# Patient Record
Sex: Female | Born: 1950 | Race: Black or African American | Hispanic: No | Smoking: Never smoker
Health system: Southern US, Community
[De-identification: ages and names within clinical notes are randomized; demographics above are authoritative.]

## PROBLEM LIST (undated history)

## (undated) DIAGNOSIS — I10 Essential (primary) hypertension: Secondary | ICD-10-CM

## (undated) DIAGNOSIS — E785 Hyperlipidemia, unspecified: Secondary | ICD-10-CM

## (undated) DIAGNOSIS — C719 Malignant neoplasm of brain, unspecified: Secondary | ICD-10-CM

## (undated) DIAGNOSIS — E079 Disorder of thyroid, unspecified: Secondary | ICD-10-CM

## (undated) DIAGNOSIS — J189 Pneumonia, unspecified organism: Secondary | ICD-10-CM

## (undated) HISTORY — DX: Essential (primary) hypertension: I10

## (undated) HISTORY — DX: Hyperlipidemia, unspecified: E78.5

## (undated) HISTORY — DX: Disorder of thyroid, unspecified: E07.9

## (undated) HISTORY — PX: TOOTH EXTRACTION: SUR596

---
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
## 1981-06-11 HISTORY — PX: ABDOMINAL HYSTERECTOMY: SHX81

## 2000-01-18 ENCOUNTER — Ambulatory Visit (HOSPITAL_COMMUNITY): Admission: RE | Admit: 2000-01-18 | Discharge: 2000-01-18 | Payer: Self-pay | Admitting: Gastroenterology

## 2003-11-05 ENCOUNTER — Ambulatory Visit (HOSPITAL_COMMUNITY): Admission: RE | Admit: 2003-11-05 | Discharge: 2003-11-05 | Payer: Self-pay | Admitting: Gastroenterology

## 2006-04-01 ENCOUNTER — Encounter: Admission: RE | Admit: 2006-04-01 | Discharge: 2006-04-01 | Payer: Self-pay | Admitting: Internal Medicine

## 2006-04-01 IMAGING — CR DG CHEST 2V
2 series · 2 of 2 positions shown · non-contrast
Comparison: None.

CLINICAL DATA: Persistent cough and shortness of breath.
CHEST ? 2 VIEW:

[view not recorded (1 of 2)]
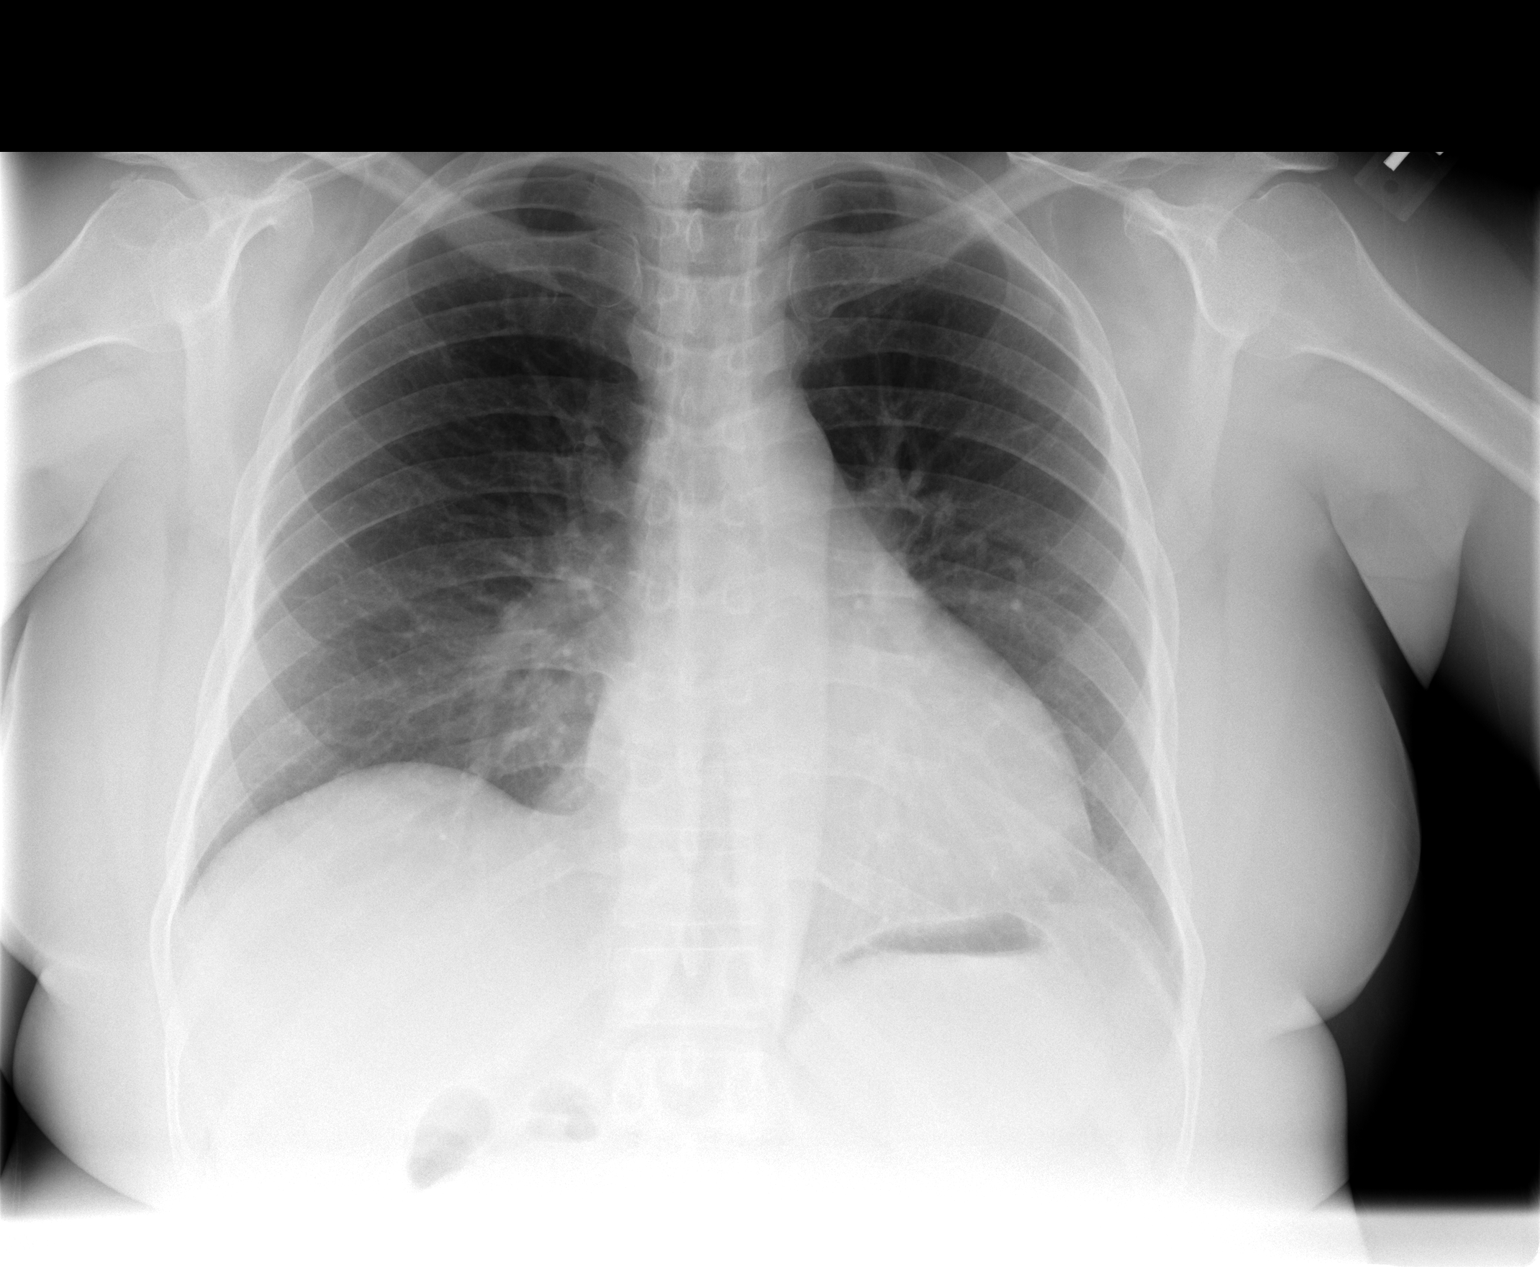

[view not recorded (2 of 2)]
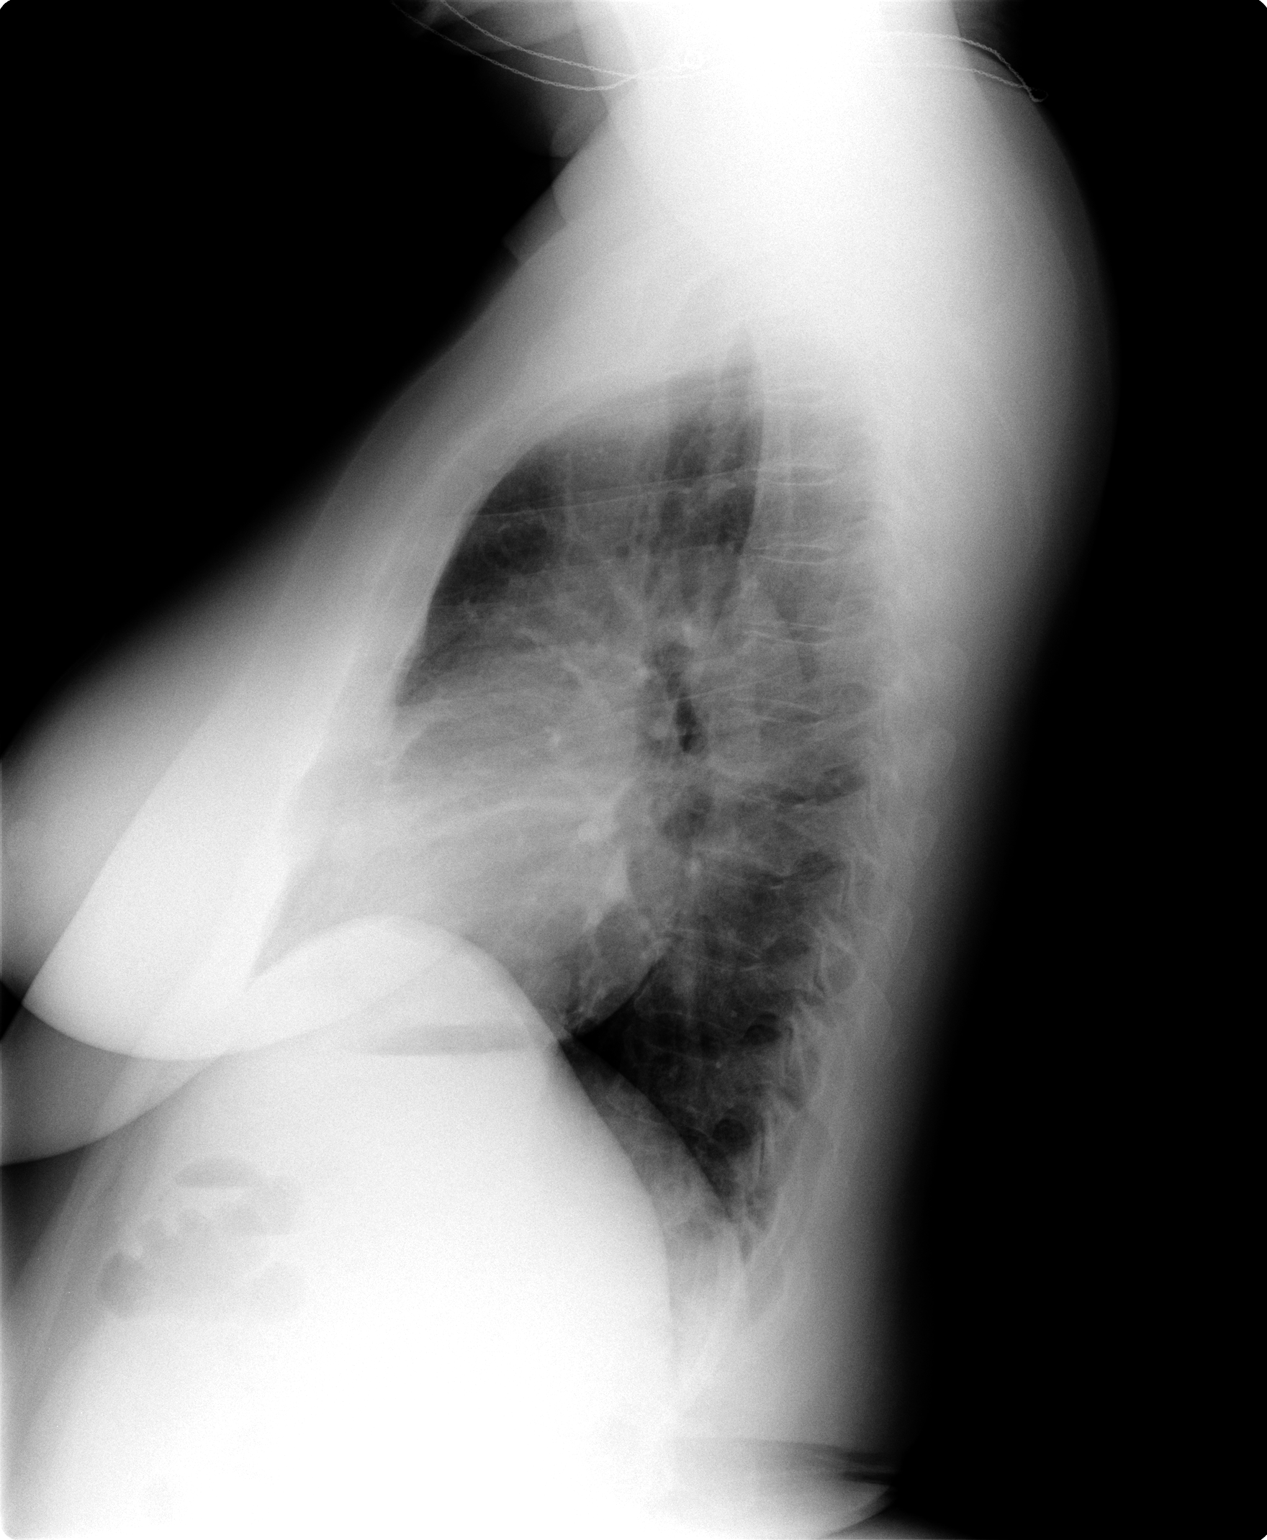

[2 of 2 positions shown; findings below may reference images not displayed]

FINDINGS: Opacity in the right infrahilar region is not well seen on the lateral view.  Lungs are otherwise clear.  No pleural fluid.
IMPRESSION: Right infrahilar opacity may be due to mild pectus deformity.  Developing airspace disease not excluded.

## 2006-10-24 ENCOUNTER — Emergency Department (HOSPITAL_COMMUNITY): Admission: EM | Admit: 2006-10-24 | Discharge: 2006-10-24 | Payer: Self-pay | Admitting: Family Medicine

## 2010-02-11 ENCOUNTER — Emergency Department (HOSPITAL_COMMUNITY): Admission: EM | Admit: 2010-02-11 | Discharge: 2010-02-12 | Payer: Self-pay | Admitting: Emergency Medicine
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
## 2010-10-27 NOTE — Op Note (Signed)
NAME:  ZAKYIA, GAGAN                         ACCOUNT NO.:  [id number]   MEDICAL RECORD NO.:  [id number]                   PATIENT TYPE:  AMB   LOCATION:  ENDO                                 FACILITY:  [facility]   PHYSICIAN:  Anselmo Rod, M.D.               DATE OF BIRTH:  10-05-1950   DATE OF PROCEDURE:  11/07/2003  DATE OF DISCHARGE:  11/05/2003                                 OPERATIVE REPORT   PROCEDURE:  Screening colonoscopy.   ENDOSCOPIST:  Charna Elizabeth, M.D.   INSTRUMENT USED:  Olympus video colonoscope.   INDICATIONS FOR PROCEDURE:  A 60 year old African-American female with a  personal history of adenomatous polyps removed in the past, and guaiac-  positive stool on a recent examination; undergoing a repeat colonoscopy for  surveillance purposes and rule out recurrent polyps.   PREPROCEDURE PREPARATION:  Informed consent was procured from the patient.  The patient was fasted for 8 hr prior to the procedure and prepped with a  bottle of magnesium citrate and a gallon of NuLytely the night prior to the  procedure.   PREPROCEDURE PHYSICAL:  VITAL SIGNS:  Stable.  NECK:  Supple.  CHEST:  Clear to auscultation.  HEART:  S1, S2 regular.  ABDOMEN:  Soft with normal bowel sounds.   DESCRIPTION OF PROCEDURE:  The patient was placed in the left lateral  decubitus position and sedated with 70 mg Demerol and 7 mg of Versed in slow  incremental doses.  Once the patient was adequately sedated and maintained  on low-flow oxygen and continuous cardiac monitoring, the Olympus video  colonoscope was advanced into the rectum to the cecum.  With the exception  of a few scattered diverticula, no other abnormalities were noted.  The  entire colonic mucosa appeared healthy with a normal vascular pattern.  The  appendiceal orifice and the ileocecal valve were clearly visualized and  photographed.  In retroflexion the rectum revealed no abnormalities.   IMPRESSION:  Normal colonoscopy up  to the cecum, except for a few scattered  diverticula.   RECOMMENDATIONS:  1. Repeat guaiac testing will be done on an outpatient basis, and further     recommendations made as needed.  2. A repeat colonoscopy is recommended in the next five years, consistent     with the patient's personal history of     adenomatous polyps in the past.  3. Outpatient follow-up as need arises in the future.  4. Continue a high fiber diet for now.                                               Anselmo Rod, M.D.    JNM/MEDQ  D:  11/07/2003  T:  11/07/2003  Job:  [id number]   cc:   Cam Hai,  C.N.M.  [address]  [place], [place] [address]  Fax: [phone]

## 2010-10-27 NOTE — Procedures (Signed)
[facility]  Patient:    Denise Wright, Denise Wright                     MRN: [id number] Proc. Date: 01/18/00 Attending:  Anselmo Rod, M.D. CC:         Merlene Laughter. Renae Gloss, M.D.                           Procedure Report  DATE OF BIRTH:  September 24, 1950  PROCEDURE:  Colonoscopy.  ENDOSCOPIST:  Anselmo Rod, M.D.  INSTRUMENT USED:  Olympus video colonoscope.  INDICATIONS FOR PROCEDURE:  Personal history of polyps in a 59 year old black female, rule out recurrent polyps.  PREPROCEDURE PREPARATION:  Informed consent was obtained from the patient. The patient was fasted for 8 hours prior to the procedure and prepped with a bottle of magnesium citrate and a gallon of Nulytely the night prior to the procedure.  PREPROCEDURE PHYSICAL EXAMINATION:  VITAL SIGNS:  Stable.  NECK:  Supple.  CHEST:  Clear to auscultation.  S1 and S2 regular.  ABDOMEN:  Soft with normal abdominal bowel sounds.  DESCRIPTION OF PROCEDURE:  The patient was placed in the left lateral decubitus position and sedated with 50 mg of Demerol and 5 mg of Versed intravenously.  Once the patient was adequately sedated and maintained on low flow oxygen and continuous cardiac monitoring, the Olympus video colonoscope was advanced from the rectum to the cecum without difficulty.  The patient had an excellent prep.  No masses, polyps, erosions, ulcerations, or diverticuli were seen.  There were small external hemorrhoids present on anal inspection. The patient tolerated the procedure well without complications.  IMPRESSION: 1. Normal colonoscopy. 2. Small external hemorrhoids.  RECOMMENDATIONS:  Repeat colorectal cancer screening is recommended in the next 5 years or earlier if the patient develops any symptoms in the interim. DD:  01/18/00 TD:  01/18/00 Job: [id number]

## 2011-07-11 ENCOUNTER — Emergency Department (HOSPITAL_COMMUNITY): Payer: BC Managed Care – PPO

## 2011-07-11 ENCOUNTER — Emergency Department (HOSPITAL_COMMUNITY)
Admission: EM | Admit: 2011-07-11 | Discharge: 2011-07-11 | Disposition: A | Discharge status: Home / Self Care | Payer: BC Managed Care – PPO | Attending: Emergency Medicine | Admitting: Emergency Medicine

## 2011-07-11 ENCOUNTER — Encounter (HOSPITAL_COMMUNITY): Payer: Self-pay

## 2011-07-11 DIAGNOSIS — R6889 Other general symptoms and signs: Secondary | ICD-10-CM

## 2011-07-11 DIAGNOSIS — R197 Diarrhea, unspecified: Secondary | ICD-10-CM | POA: Insufficient documentation

## 2011-07-11 DIAGNOSIS — R0602 Shortness of breath: Secondary | ICD-10-CM | POA: Insufficient documentation

## 2011-07-11 DIAGNOSIS — R079 Chest pain, unspecified: Secondary | ICD-10-CM | POA: Insufficient documentation

## 2011-07-11 DIAGNOSIS — R000 Tachycardia, unspecified: Secondary | ICD-10-CM | POA: Insufficient documentation

## 2011-07-11 DIAGNOSIS — J111 Influenza due to unidentified influenza virus with other respiratory manifestations: Secondary | ICD-10-CM | POA: Insufficient documentation

## 2011-07-11 DIAGNOSIS — J189 Pneumonia, unspecified organism: Secondary | ICD-10-CM | POA: Insufficient documentation

## 2011-07-11 LAB — DIFFERENTIAL
Basophils Absolute: 0 10*3/uL (ref 0.0–0.1)
Basophils Relative: 0 % (ref 0–1)
Eosinophils Relative: 0 % (ref 0–5)
Monocytes Absolute: 1.7 10*3/uL — ABNORMAL HIGH (ref 0.1–1.0)

## 2011-07-11 LAB — CBC
HCT: 35.3 % — ABNORMAL LOW (ref 36.0–46.0)
MCH: 29.5 pg (ref 26.0–34.0)
MCHC: 33.7 g/dL (ref 30.0–36.0)
MCV: 87.4 fL (ref 78.0–100.0)
RDW: 14.1 % (ref 11.5–15.5)

## 2011-07-11 LAB — POCT I-STAT, CHEM 8
Calcium, Ion: 1.11 mmol/L — ABNORMAL LOW (ref 1.12–1.32)
HCT: 39 % (ref 36.0–46.0)
TCO2: 25 mmol/L (ref 0–100)

## 2011-07-11 IMAGING — CR DG CHEST 2V
2 series · 2 of 2 positions shown · non-contrast
Comparison: [DATE]

CLINICAL DATA: Cough, chest pain, shortness of breath.

CHEST - 2 VIEW

[w chest pa]
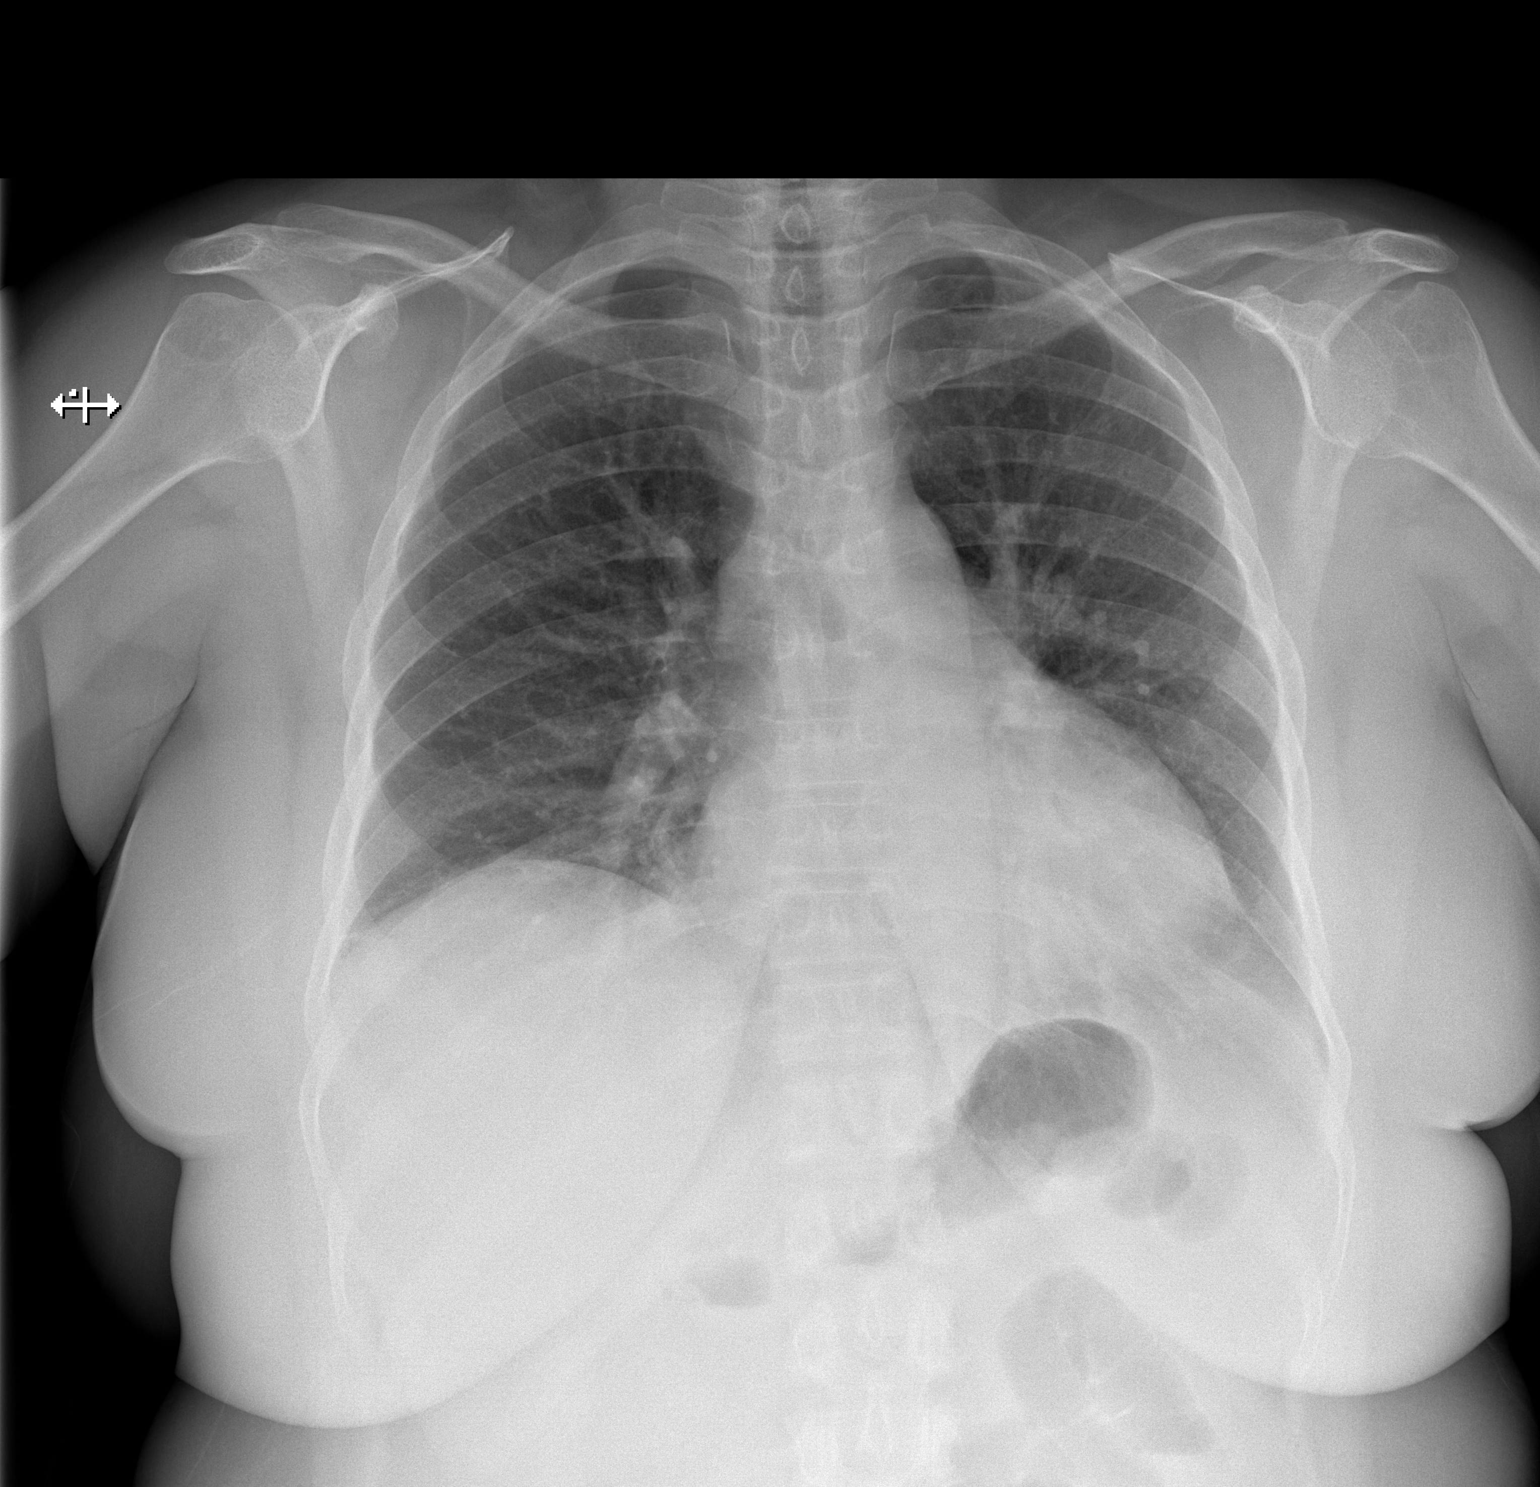

[w chest lat]
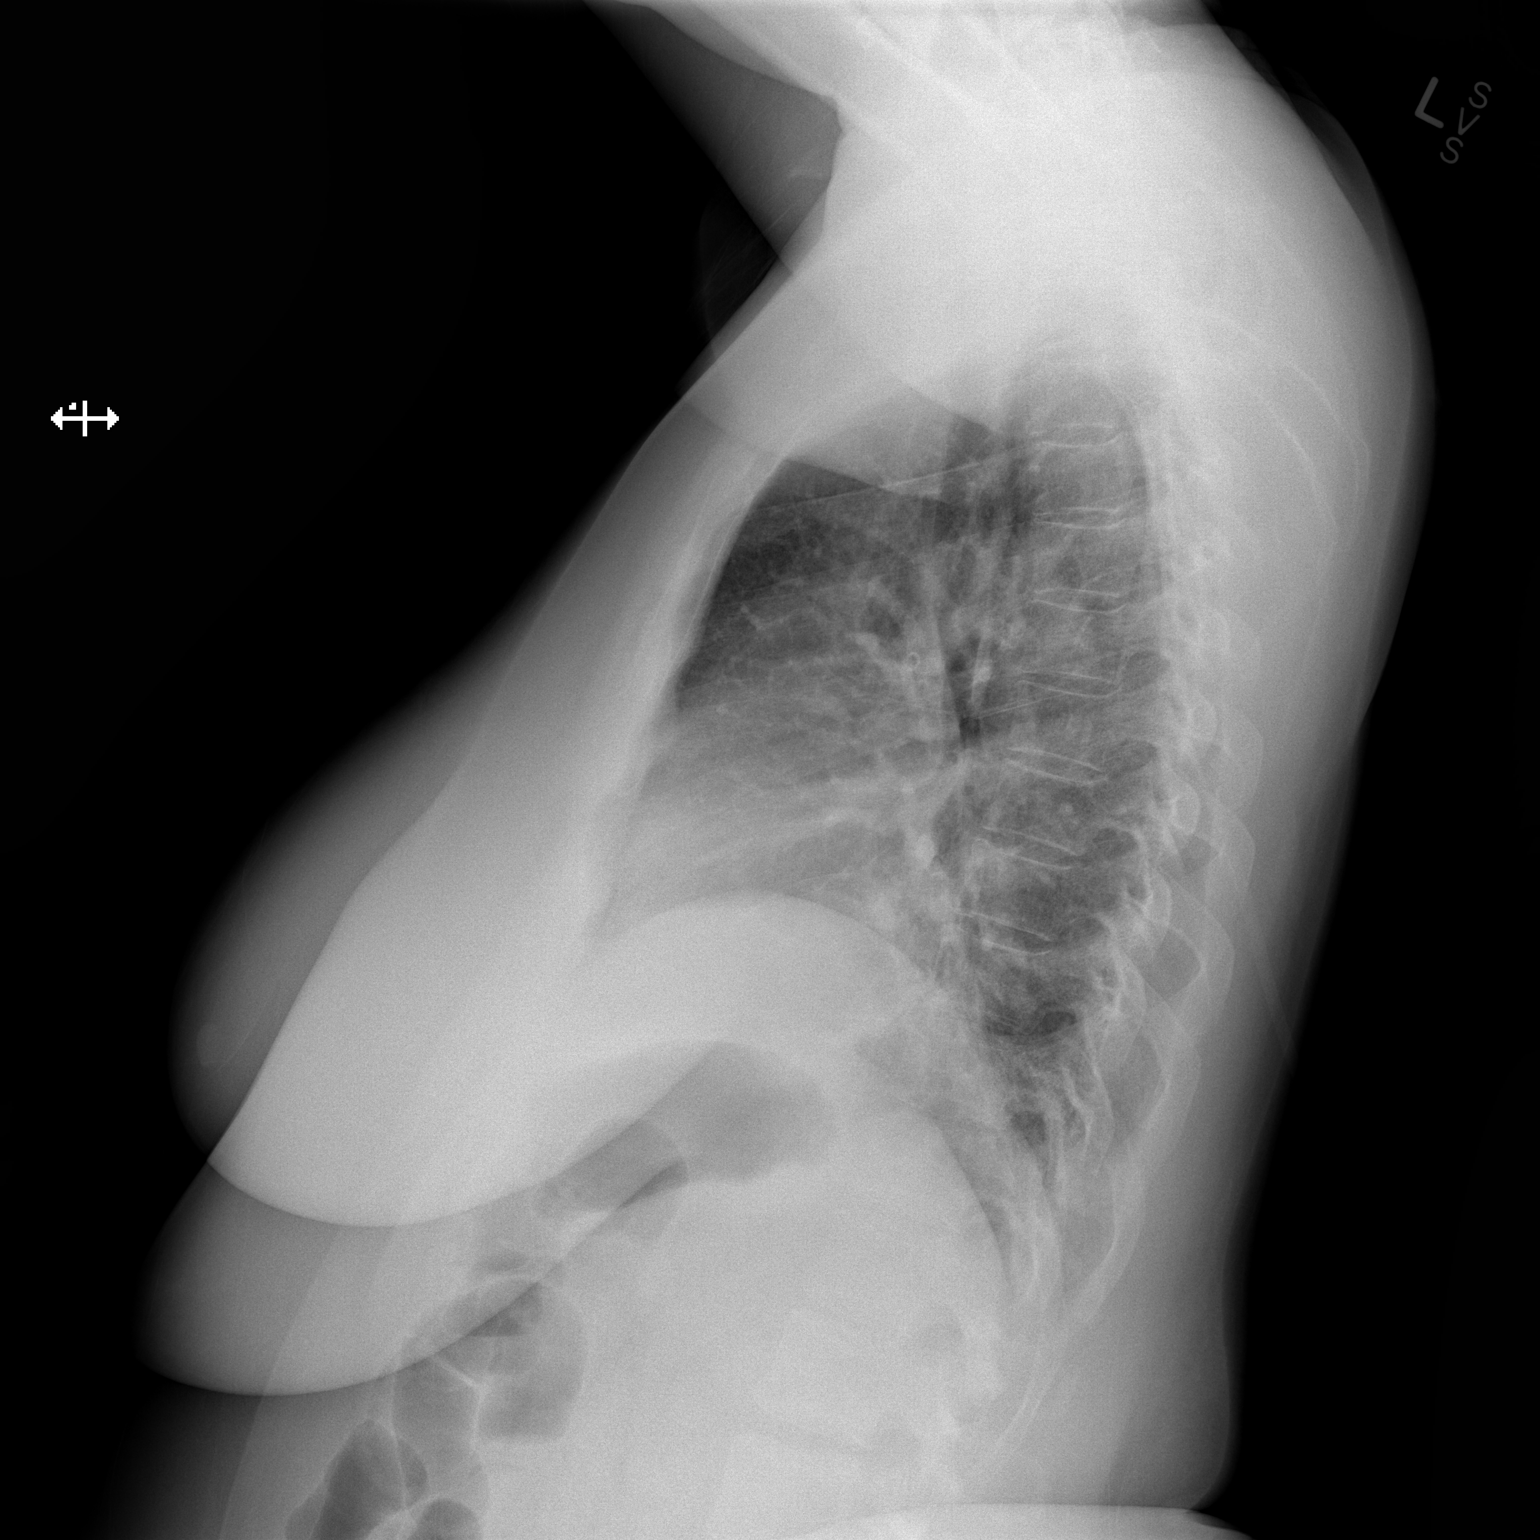

[2 of 2 positions shown; findings below may reference images not displayed]

FINDINGS: Heart is mildly enlarged.  Bilateral lower lobe airspace
opacities are noted.  Airspace disease noted posteriorly on the
lateral view.  Findings concerning for possible pneumonia,
particularly in the left lower lobe. Right base opacity may reflect
atelectasis.  No effusions.  No acute bony abnormality.
IMPRESSION: Bibasilar opacities, atelectasis versus pneumonia.

## 2011-07-11 MED ORDER — IBUPROFEN 200 MG PO TABS
600.0000 mg | ORAL_TABLET | Freq: Once | ORAL | Status: AC
  Administered 2011-07-11: 600 mg via ORAL
  Filled 2011-07-11: qty 3

## 2011-07-11 MED ORDER — SODIUM CHLORIDE 0.9 % IV BOLUS (SEPSIS)
500.0000 mL | Freq: Once | INTRAVENOUS | Status: AC
  Administered 2011-07-11: 500 mL via INTRAVENOUS

## 2011-07-11 MED ORDER — ACETAMINOPHEN 500 MG PO TABS
1000.0000 mg | ORAL_TABLET | Freq: Once | ORAL | Status: AC
  Administered 2011-07-11: 1000 mg via ORAL
  Filled 2011-07-11: qty 2

## 2011-07-11 MED ORDER — OSELTAMIVIR PHOSPHATE 75 MG PO CAPS: 75.0000 mg | ORAL_CAPSULE | Freq: Two times a day (BID) | ORAL | Status: DC

## 2011-07-11 MED ORDER — LEVOFLOXACIN 500 MG PO TABS: 750.0000 mg | ORAL_TABLET | Freq: Every day | ORAL | Status: AC

## 2011-07-11 MED ORDER — ONDANSETRON HCL 4 MG/2ML IJ SOLN
4.0000 mg | INTRAMUSCULAR | Status: DC | PRN
  Administered 2011-07-11: 4 mg via INTRAVENOUS
  Filled 2011-07-11: qty 2

## 2011-07-11 MED ORDER — HYDROCODONE-HOMATROPINE 5-1.5 MG/5ML PO SYRP: 5.0000 mL | ORAL_SOLUTION | Freq: Four times a day (QID) | ORAL | Status: DC | PRN

## 2011-07-11 MED ORDER — MORPHINE SULFATE 4 MG/ML IJ SOLN
4.0000 mg | Freq: Once | INTRAMUSCULAR | Status: AC
  Administered 2011-07-11: 4 mg via INTRAVENOUS
  Filled 2011-07-11: qty 1

## 2011-07-11 MED ORDER — ONDANSETRON HCL 4 MG PO TABS: 4.0000 mg | ORAL_TABLET | Freq: Four times a day (QID) | ORAL | Status: DC | PRN

## 2011-07-11 NOTE — ED Notes (Signed)
Cough, chills, body aches since yesterday, vomiting and diarrhea x 1 hour

## 2011-07-11 NOTE — ED Notes (Signed)
Pt given discharge instructions, verb understanding and amb indep to discharge window

## 2011-07-11 NOTE — ED Notes (Signed)
Patient is resting comfortably. 

## 2011-07-11 NOTE — ED Provider Notes (Signed)
61 year old female had onset yesterday of fever, chills, cough and myalgias. Today she developed vomiting and diarrhea. Chest x-ray shows a faint infiltrate at the left base consistent with pneumonia. Her lungs are clear to exam. Clinically, she has influenza. Because of her pneumonia, she will need to be treated with antibiotics. She will be offered a prescription for Tamiflu, however, I have explained the limitations of Tamiflu and treating influenza. She states she did not get her flu shot this year. Vomiting and diarrhea will be treated symptomatically.  Dione Booze, MD 07/11/11 [phone]

## 2011-07-11 NOTE — ED Provider Notes (Signed)
History     CSN: [id number]  Arrival date & time 07/11/11  1422   First MD Initiated Contact with Patient 07/11/11 1457      Chief Complaint  Patient presents with  . Cough  . Emesis  . Diarrhea    (Consider location/radiation/quality/duration/timing/severity/associated sxs/prior treatment) HPI Comments: Patient presents to the emergency department with chief complaint of cough, emesis, and diarrhea.  Patient states cough symptoms began yesterday evening and were associated with fever, night sweats, and chills around 6 p.m. patient states that this morning she began having fatigue and bodyaches and that her cough became productive.  Patient has been using throat lossengers and Robitussin which have not worked. Around 1 o'clock this afternoon the patient began to develop emesis x2 episodes and diarrhea x4 episodes.  Patient denies hematemesis and hematochezia.  Patient states she's had a history of bronchitis and denies having sick contacts.  Patient's primary care physician is Dr. Renae Gloss.  Patient denies shortness of breath, wheezing, dyspnea on exertion, chest pain, UA lateral or bilateral leg swelling, claudication, abdominal pain  Patient is a 61 y.o. female presenting with cough, vomiting, and diarrhea. The history is provided by the patient.  Cough Associated symptoms include chills and myalgias. Pertinent negatives include no chest pain, no ear pain, no rhinorrhea and no sore throat.  Emesis  Associated symptoms include chills, cough, diarrhea, a fever and myalgias. Pertinent negatives include no abdominal pain.  Diarrhea The primary symptoms include fever, fatigue, vomiting, diarrhea and myalgias. Primary symptoms do not include abdominal pain, nausea, dysuria or rash.  The myalgias are not associated with weakness.  The illness is also significant for chills.    History reviewed. No pertinent past medical history.  Past Surgical History  Procedure Date  . Abdominal  hysterectomy     Family History  Problem Relation Age of Onset  . Diabetes Mother   . Hypertension Mother   . Hypertension Father     History  Substance Use Topics  . Smoking status: Never Smoker   . Smokeless tobacco: Never Used  . Alcohol Use: No    OB History    Grav Para Term Preterm Abortions TAB SAB Ect Mult Living                  Review of Systems  Constitutional: Positive for fever, chills and fatigue.  HENT: Negative for ear pain, congestion, sore throat, rhinorrhea, sneezing, neck pain, neck stiffness, sinus pressure and tinnitus.   Eyes: Negative for visual disturbance.  Respiratory: Positive for cough and chest tightness.   Cardiovascular: Negative for chest pain and palpitations.  Gastrointestinal: Positive for vomiting and diarrhea. Negative for nausea and abdominal pain.  Genitourinary: Negative for dysuria.  Musculoskeletal: Positive for myalgias.  Skin: Negative for color change and rash.  Neurological: Negative for dizziness and weakness.  Hematological: Does not bruise/bleed easily.  Psychiatric/Behavioral: Negative for confusion.  All other systems reviewed and are negative.    Allergies  Penicillins  Home Medications   Current Outpatient Rx  Name Route Sig Dispense Refill  . VITAMIN D 1000 UNITS PO TABS Oral Take 2,000 Units by mouth daily.      BP 139/73  Pulse 110  Temp(Src) 100.6 F (38.1 C) (Oral)  Resp 18  Ht 5\' 4"  (1.626 m)  Wt 198 lb (89.812 kg)  BMI 33.99 kg/m2  SpO2 96%  Physical Exam  Nursing note and vitals reviewed. Constitutional: She is oriented to person, place, and time. She appears  well-developed and well-nourished. No distress.       Mildly febrile, 100.6  HENT:  Head: Normocephalic and atraumatic. No trismus in the jaw.  Right Ear: External ear normal. No drainage or tenderness. No mastoid tenderness.  Left Ear: External ear normal. No drainage or tenderness. No mastoid tenderness.  Nose: Nose normal. No  rhinorrhea or sinus tenderness.  Mouth/Throat: Uvula is midline, oropharynx is clear and moist and mucous membranes are normal. No uvula swelling. No oropharyngeal exudate.  Eyes: Conjunctivae and EOM are normal. Right eye exhibits no discharge. Left eye exhibits no discharge. No scleral icterus.  Neck: Normal range of motion. Neck supple.  Cardiovascular: Regular rhythm and normal heart sounds.        Tachycardic at 110  Pulmonary/Chest: Effort normal and breath sounds normal. No stridor. No respiratory distress. She has no wheezes. She exhibits tenderness.  Abdominal: Soft. There is no tenderness.  Musculoskeletal: Normal range of motion.  Neurological: She is alert and oriented to person, place, and time.  Skin: Skin is warm and dry. No rash noted. She is not diaphoretic.  Psychiatric: She has a normal mood and affect. Her behavior is normal.    ED Course  Procedures (including critical care time)  Labs Reviewed  CBC - Abnormal; Notable for the following:    WBC 16.3 (*)    Hemoglobin 11.9 (*)    HCT 35.3 (*)    All other components within normal limits  DIFFERENTIAL - Abnormal; Notable for the following:    Neutrophils Relative 85 (*)    Neutro Abs 13.8 (*)    Lymphocytes Relative 4 (*)    Monocytes Absolute 1.7 (*)    All other components within normal limits  POCT I-STAT, CHEM 8 - Abnormal; Notable for the following:    Glucose, Bld 119 (*)    Calcium, Ion 1.11 (*)    All other components within normal limits   Dg Chest 2 View  07/11/2011  *RADIOLOGY REPORT*  Clinical Data: Cough, chest pain, shortness of breath.  CHEST - 2 VIEW  Comparison: 02/11/2010  Findings: Heart is mildly enlarged.  Bilateral lower lobe airspace opacities are noted.  Airspace disease noted posteriorly on the lateral view.  Findings concerning for possible pneumonia, particularly in the left lower lobe. Right base opacity may reflect atelectasis.  No effusions.  No acute bony abnormality.  IMPRESSION:  Bibasilar opacities, atelectasis versus pneumonia.  Original Report Authenticated By: Cyndie Chime, M.D.     No diagnosis found.  Pt ambulated in ED and able to keep O2 saturation on RA at 96%.  MDM  PNA & Flu like s/s  Patient with symptoms consistent with influenza.  Vitals are stable, low-grade fever.  No signs of dehydration, tolerating PO's.  Lungs are clear to auscultataion.Chest x-ray shows evidence of PNA and will be dc with Levaquin. Pt clinically presents with flu like s/s as well. Discussed the cost versus benefit of Tamiflu treatment with the patient.  Patient will be discharged with instructions to orally hydrate, rest, and use over-the-counter medications such as anti-inflammatories ibuprofen and Aleve for muscle aches and Tylenol for fever.  Patient will also be given a cough suppressant. Zofran will be given for nausea.          Jaci Carrel, [place] 07/11/11 1644

## 2011-07-11 NOTE — ED Notes (Signed)
Family at bedside.  Pt has had urinary dribbling with cough, pampers and linens provided, daughter providing peri care (per pt request).  Warm blankets and foot socks provided.  Pt states she is comfortable and has no needs at this time

## 2011-07-12 NOTE — ED Provider Notes (Signed)
Medical screening examination/treatment/procedure(s) were conducted as a shared visit with non-physician practitioner(s) and myself.  I personally evaluated the patient during the encounter   Tyshana Nishida, MD 07/12/11 1549 

## 2011-07-13 ENCOUNTER — Inpatient Hospital Stay (HOSPITAL_COMMUNITY)
Admission: EM | Admit: 2011-07-13 | Discharge: 2011-07-17 | DRG: 541 | Disposition: A | Discharge status: Home / Self Care | Payer: BC Managed Care – PPO | Attending: Internal Medicine | Admitting: Internal Medicine

## 2011-07-13 ENCOUNTER — Other Ambulatory Visit: Payer: Self-pay

## 2011-07-13 ENCOUNTER — Encounter (HOSPITAL_COMMUNITY): Payer: Self-pay | Admitting: *Deleted

## 2011-07-13 ENCOUNTER — Emergency Department (HOSPITAL_COMMUNITY): Payer: BC Managed Care – PPO

## 2011-07-13 DIAGNOSIS — R739 Hyperglycemia, unspecified: Secondary | ICD-10-CM

## 2011-07-13 DIAGNOSIS — D72829 Elevated white blood cell count, unspecified: Secondary | ICD-10-CM | POA: Diagnosis present

## 2011-07-13 DIAGNOSIS — R7309 Other abnormal glucose: Secondary | ICD-10-CM | POA: Diagnosis present

## 2011-07-13 DIAGNOSIS — N179 Acute kidney failure, unspecified: Secondary | ICD-10-CM | POA: Diagnosis present

## 2011-07-13 DIAGNOSIS — H6120 Impacted cerumen, unspecified ear: Secondary | ICD-10-CM | POA: Diagnosis present

## 2011-07-13 DIAGNOSIS — J09X1 Influenza due to identified novel influenza A virus with pneumonia: Principal | ICD-10-CM | POA: Diagnosis present

## 2011-07-13 DIAGNOSIS — H6123 Impacted cerumen, bilateral: Secondary | ICD-10-CM | POA: Diagnosis present

## 2011-07-13 DIAGNOSIS — D649 Anemia, unspecified: Secondary | ICD-10-CM | POA: Diagnosis present

## 2011-07-13 DIAGNOSIS — E871 Hypo-osmolality and hyponatremia: Secondary | ICD-10-CM | POA: Diagnosis present

## 2011-07-13 DIAGNOSIS — R059 Cough, unspecified: Secondary | ICD-10-CM | POA: Diagnosis present

## 2011-07-13 DIAGNOSIS — J189 Pneumonia, unspecified organism: Secondary | ICD-10-CM | POA: Diagnosis present

## 2011-07-13 DIAGNOSIS — T380X5A Adverse effect of glucocorticoids and synthetic analogues, initial encounter: Secondary | ICD-10-CM | POA: Diagnosis present

## 2011-07-13 DIAGNOSIS — R05 Cough: Secondary | ICD-10-CM | POA: Diagnosis present

## 2011-07-13 DIAGNOSIS — E860 Dehydration: Secondary | ICD-10-CM

## 2011-07-13 HISTORY — DX: Pneumonia, unspecified organism: J18.9

## 2011-07-13 LAB — POCT I-STAT, CHEM 8
Creatinine, Ser: 1.4 mg/dL — ABNORMAL HIGH (ref 0.50–1.10)
Hemoglobin: 11.6 g/dL — ABNORMAL LOW (ref 12.0–15.0)
Sodium: 134 mEq/L — ABNORMAL LOW (ref 135–145)
TCO2: 28 mmol/L (ref 0–100)

## 2011-07-13 IMAGING — CR DG CHEST 1V PORT
1 series · 1 of 1 positions shown · non-contrast
Comparison: [DATE].

CLINICAL DATA: Short of breath.  Cough and congestion.

PORTABLE CHEST - 1 VIEW

[AP]
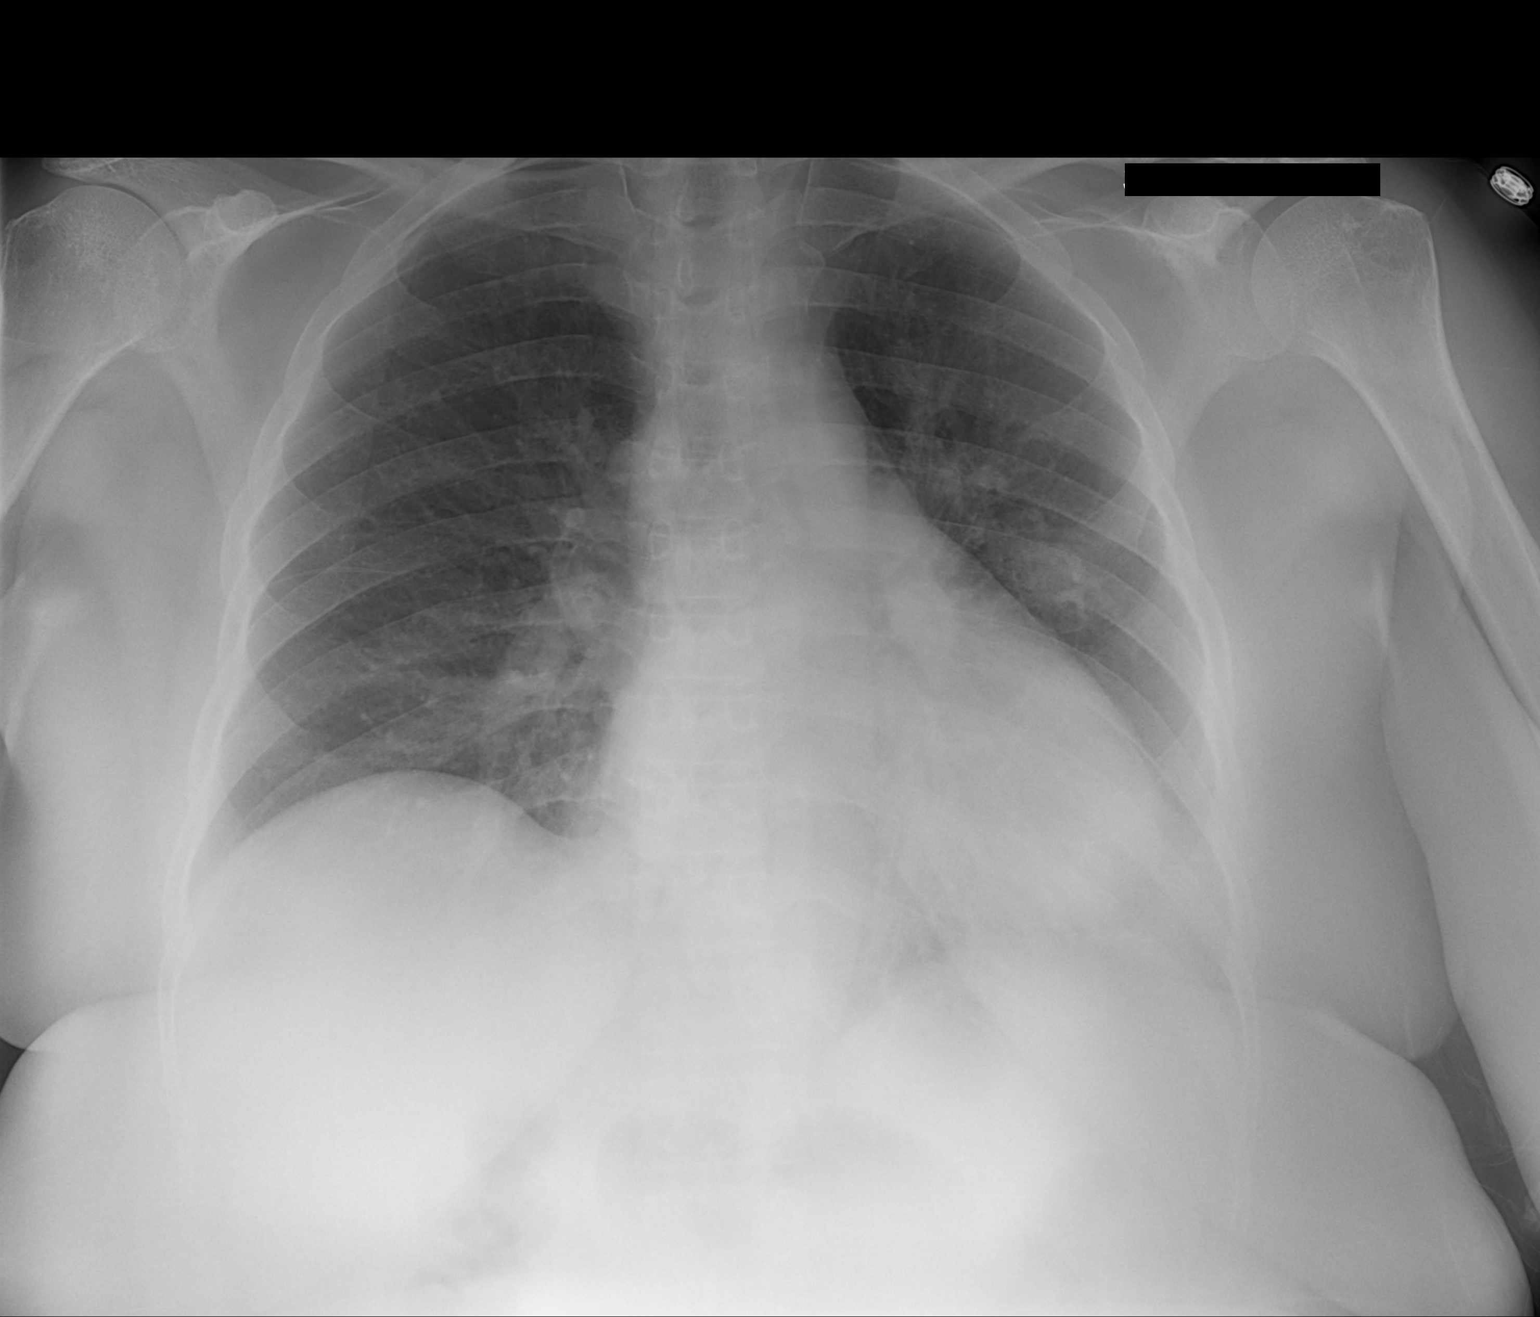

[1 of 1 positions shown; findings below may reference images not displayed]

FINDINGS: Streaky retrocardiac density remains present, suspicious
for pneumonia.  Appearance of the right lung base is little
changed.  Cardiopericardial silhouette and mediastinal contours are
within normal limits.  No pleural effusion is identified.
IMPRESSION: Streaky retrocardiac density suspicious for left lower lobe
pneumonia.  Little change from [DATE].

## 2011-07-13 MED ORDER — SODIUM CHLORIDE 0.9 % IV BOLUS (SEPSIS)
1000.0000 mL | Freq: Once | INTRAVENOUS | Status: AC
  Administered 2011-07-13: 1000 mL via INTRAVENOUS

## 2011-07-13 MED ORDER — LEVOFLOXACIN IN D5W 750 MG/150ML IV SOLN
750.0000 mg | Freq: Once | INTRAVENOUS | Status: AC
  Administered 2011-07-13: 750 mg via INTRAVENOUS
  Filled 2011-07-13: qty 150

## 2011-07-13 MED ORDER — GUAIFENESIN-DM 100-10 MG/5ML PO SYRP: 5.0000 mL | ORAL_SOLUTION | ORAL | Status: DC | PRN

## 2011-07-13 MED ORDER — ALBUTEROL SULFATE (5 MG/ML) 0.5% IN NEBU
5.0000 mg | INHALATION_SOLUTION | Freq: Once | RESPIRATORY_TRACT | Status: AC
  Administered 2011-07-13: 5 mg via RESPIRATORY_TRACT
  Filled 2011-07-13: qty 1

## 2011-07-13 MED ORDER — POLYETHYLENE GLYCOL 3350 17 G PO PACK
17.0000 g | PACK | Freq: Every day | ORAL | Status: DC | PRN
  Filled 2011-07-13: qty 1

## 2011-07-13 MED ORDER — ENOXAPARIN SODIUM 40 MG/0.4ML ~~LOC~~ SOLN
40.0000 mg | SUBCUTANEOUS | Status: DC
  Administered 2011-07-13 – 2011-07-16 (×4): 40 mg via SUBCUTANEOUS
  Filled 2011-07-13 (×5): qty 0.4

## 2011-07-13 MED ORDER — ALBUTEROL SULFATE (5 MG/ML) 0.5% IN NEBU
2.5000 mg | INHALATION_SOLUTION | Freq: Four times a day (QID) | RESPIRATORY_TRACT | Status: DC
  Administered 2011-07-13 – 2011-07-16 (×10): 2.5 mg via RESPIRATORY_TRACT
  Filled 2011-07-13 (×10): qty 0.5

## 2011-07-13 MED ORDER — GUAIFENESIN ER 600 MG PO TB12
600.0000 mg | ORAL_TABLET | Freq: Two times a day (BID) | ORAL | Status: DC
  Administered 2011-07-13 – 2011-07-17 (×8): 600 mg via ORAL
  Filled 2011-07-13 (×9): qty 1

## 2011-07-13 MED ORDER — LEVOFLOXACIN IN D5W 750 MG/150ML IV SOLN
750.0000 mg | INTRAVENOUS | Status: DC
  Administered 2011-07-14 – 2011-07-15 (×2): 750 mg via INTRAVENOUS
  Filled 2011-07-13 (×3): qty 150

## 2011-07-13 MED ORDER — SODIUM CHLORIDE 0.9 % IJ SOLN
3.0000 mL | Freq: Two times a day (BID) | INTRAMUSCULAR | Status: DC
  Administered 2011-07-15 – 2011-07-16 (×3): 3 mL via INTRAVENOUS

## 2011-07-13 MED ORDER — OXYCODONE HCL 5 MG PO TABS: 5.0000 mg | ORAL_TABLET | ORAL | Status: DC | PRN

## 2011-07-13 MED ORDER — SODIUM CHLORIDE 0.9 % IJ SOLN: 3.0000 mL | INTRAMUSCULAR | Status: DC | PRN

## 2011-07-13 MED ORDER — METHYLPREDNISOLONE SODIUM SUCC 125 MG IJ SOLR
80.0000 mg | Freq: Four times a day (QID) | INTRAMUSCULAR | Status: DC
  Administered 2011-07-13 – 2011-07-15 (×7): 80 mg via INTRAVENOUS
  Filled 2011-07-13 (×10): qty 1.28

## 2011-07-13 MED ORDER — IPRATROPIUM BROMIDE 0.02 % IN SOLN
0.5000 mg | Freq: Four times a day (QID) | RESPIRATORY_TRACT | Status: DC
  Administered 2011-07-13 – 2011-07-16 (×9): 0.5 mg via RESPIRATORY_TRACT
  Filled 2011-07-13 (×9): qty 2.5

## 2011-07-13 MED ORDER — ALBUTEROL SULFATE (5 MG/ML) 0.5% IN NEBU
5.0000 mg | INHALATION_SOLUTION | Freq: Once | RESPIRATORY_TRACT | Status: AC
  Administered 2011-07-13: 5 mg via RESPIRATORY_TRACT
  Filled 2011-07-13: qty 0.5

## 2011-07-13 MED ORDER — VITAMIN D3 25 MCG (1000 UNIT) PO TABS
2000.0000 [IU] | ORAL_TABLET | Freq: Every day | ORAL | Status: DC
  Administered 2011-07-13 – 2011-07-17 (×5): 2000 [IU] via ORAL
  Filled 2011-07-13 (×5): qty 2

## 2011-07-13 MED ORDER — ZOLPIDEM TARTRATE 5 MG PO TABS
5.0000 mg | ORAL_TABLET | Freq: Every evening | ORAL | Status: DC | PRN
  Administered 2011-07-13 – 2011-07-16 (×3): 5 mg via ORAL
  Filled 2011-07-13 (×3): qty 1

## 2011-07-13 MED ORDER — SODIUM CHLORIDE 0.9 % IV SOLN
INTRAVENOUS | Status: DC
  Administered 2011-07-13 – 2011-07-14 (×2): via INTRAVENOUS
  Administered 2011-07-14: 1000 mL via INTRAVENOUS

## 2011-07-13 MED ORDER — ACETAMINOPHEN 325 MG PO TABS: 650.0000 mg | ORAL_TABLET | Freq: Four times a day (QID) | ORAL | Status: DC | PRN

## 2011-07-13 MED ORDER — ACETAMINOPHEN 650 MG RE SUPP: 650.0000 mg | Freq: Four times a day (QID) | RECTAL | Status: DC | PRN

## 2011-07-13 MED ORDER — PREDNISONE 20 MG PO TABS
60.0000 mg | ORAL_TABLET | Freq: Once | ORAL | Status: AC
  Administered 2011-07-13: 60 mg via ORAL
  Filled 2011-07-13: qty 3

## 2011-07-13 MED ORDER — ALBUTEROL SULFATE (5 MG/ML) 0.5% IN NEBU: 2.5000 mg | INHALATION_SOLUTION | RESPIRATORY_TRACT | Status: DC | PRN

## 2011-07-13 MED ORDER — ONDANSETRON HCL 4 MG/2ML IJ SOLN: 4.0000 mg | Freq: Four times a day (QID) | INTRAMUSCULAR | Status: DC | PRN

## 2011-07-13 MED ORDER — ONDANSETRON HCL 4 MG PO TABS: 4.0000 mg | ORAL_TABLET | Freq: Four times a day (QID) | ORAL | Status: DC | PRN

## 2011-07-13 MED ORDER — IPRATROPIUM BROMIDE 0.02 % IN SOLN
0.5000 mg | Freq: Once | RESPIRATORY_TRACT | Status: AC
  Administered 2011-07-13: 0.5 mg via RESPIRATORY_TRACT
  Filled 2011-07-13: qty 2.5

## 2011-07-13 MED ORDER — SODIUM CHLORIDE 0.9 % IV SOLN: 250.0000 mL | INTRAVENOUS | Status: DC | PRN
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
## 2011-07-13 NOTE — ED Notes (Signed)
Respiratory at bedside.

## 2011-07-13 NOTE — ED Notes (Signed)
Patient is resting comfortably. 

## 2011-07-13 NOTE — ED Notes (Signed)
Vital signs stable. 

## 2011-07-13 NOTE — ED Notes (Signed)
Ambulated patient in hallway. O2 sat averaged at 88 room air. MD aware.

## 2011-07-13 NOTE — ED Notes (Signed)
Pt reports being seen here Wednesday and Dx with PNA and flu. Sts continued sob, nausea, cough. Decreased appetite and energy. Sts unable to sleep last night.

## 2011-07-13 NOTE — ED Provider Notes (Signed)
History     CSN: [id number]  Arrival date & time 07/13/11  1039   First MD Initiated Contact with Patient 07/13/11 1101      Chief Complaint  Patient presents with  . Shortness of Breath  . Pneumonia  . Influenza     Patient is a 61 y.o. female presenting with shortness of breath, pneumonia, and flu symptoms. The history is provided by the patient and a relative.  Shortness of Breath  The current episode started 2 days ago. The problem occurs frequently. The problem has been gradually worsening. The problem is moderate. The symptoms are relieved by nothing. The symptoms are aggravated by nothing. Associated symptoms include shortness of breath.  Pneumonia Associated symptoms include shortness of breath.  Influenza Associated symptoms include shortness of breath.   Pt reports for past several days she has cough, congestion, shortness of breath She also reports recent vomiting/diarrhea that has improved She reports since evaluation in the ED on 07/11/11, she has had increased cough/fatigue/shortness of breath No signfiicant CP is reported She has no h/o CAD/PE/CHF Past Medical History  Diagnosis Date  . Pneumonia     Past Surgical History  Procedure Date  . Abdominal hysterectomy     Family History  Problem Relation Age of Onset  . Diabetes Mother   . Hypertension Mother   . Hypertension Father     History  Substance Use Topics  . Smoking status: Never Smoker   . Smokeless tobacco: Never Used  . Alcohol Use: No    OB History    Grav Para Term Preterm Abortions TAB SAB Ect Mult Living                  Review of Systems  Respiratory: Positive for shortness of breath.   All other systems reviewed and are negative.    Allergies  Penicillins  Home Medications   Current Outpatient Rx  Name Route Sig Dispense Refill  . VITAMIN D 1000 UNITS PO TABS Oral Take 2,000 Units by mouth daily.    Marland Kitchen HYDROCODONE-HOMATROPINE 5-1.5 MG/5ML PO SYRP Oral Take 5 mLs by  mouth every 6 (six) hours as needed for cough or pain. 120 mL 0  . LEVOFLOXACIN 500 MG PO TABS Oral Take 1.5 tablets (750 mg total) by mouth daily. 5 tablet 0  . ONDANSETRON HCL 4 MG PO TABS Oral Take 1 tablet (4 mg total) by mouth every 6 (six) hours as needed for nausea. 6 tablet 0  . OSELTAMIVIR PHOSPHATE 75 MG PO CAPS Oral Take 1 capsule (75 mg total) by mouth every 12 (twelve) hours. 10 capsule 0    BP 121/72  Pulse 86  Temp(Src) 98.1 F (36.7 C) (Oral)  Resp 21  SpO2 93%  Physical Exam CONSTITUTIONAL: Well developed/well nourished HEAD AND FACE: Normocephalic/atraumatic EYES: EOMI/PERRL ENMT: Mucous membranes moist NECK: supple no meningeal signs SPINE:entire spine nontender CV: S1/S2 noted, no murmurs/rubs/gallops noted LUNGS: slight tachypnea noted, coarse BS noted bilaterally, but she is able to speak to me clearly ABDOMEN: soft, nontender, no rebound or guarding GU:no cva tenderness NEURO: Pt is awake/alert, moves all extremitiesx4 EXTREMITIES: pulses normal, full ROM SKIN: warm, color normal PSYCH: no abnormalities of mood noted   ED Course  Procedures   Labs Reviewed  POCT I-STAT, CHEM 8 - Abnormal; Notable for the following:    Sodium 134 (*)    Creatinine, Ser 1.40 (*)    Glucose, Bld 106 (*)    Hemoglobin 11.6 (*)  HCT 34.0 (*)    All other components within normal limits   Dg Chest 2 View  07/11/2011  *RADIOLOGY REPORT*  Clinical Data: Cough, chest pain, shortness of breath.  CHEST - 2 VIEW  Comparison: 02/11/2010  Findings: Heart is mildly enlarged.  Bilateral lower lobe airspace opacities are noted.  Airspace disease noted posteriorly on the lateral view.  Findings concerning for possible pneumonia, particularly in the left lower lobe. Right base opacity may reflect atelectasis.  No effusions.  No acute bony abnormality.  IMPRESSION: Bibasilar opacities, atelectasis versus pneumonia.  Original Report Authenticated By: Cyndie Chime, M.D.   Dg Chest  Port 1 View  07/13/2011  *RADIOLOGY REPORT*  Clinical Data: Short of breath.  Cough and congestion.  PORTABLE CHEST - 1 VIEW  Comparison: 07/11/2011.  Findings: Streaky retrocardiac density remains present, suspicious for pneumonia.  Appearance of the right lung base is little changed.  Cardiopericardial silhouette and mediastinal contours are within normal limits.  No pleural effusion is identified.  IMPRESSION: Streaky retrocardiac density suspicious for left lower lobe pneumonia.  Little change from 07/11/2011.  Original Report Authenticated By: Andreas Newport, M.D.    12:26 PM Pt with recent diagnosis of pneumonia, not improving at home  3:14 PM Pt still tachypneic/wheezing and mild hypoxia on ambulation Given failure of outpatient antibitoics and not improved will admit  D/w dr Darnelle Catalan, will admit      MDM  Nursing notes reviewed and considered in documentation All labs/vitals reviewed and considered xrays reviewed and considered Previous records reviewed and considered        Date: 07/13/2011  Rate: 86  Rhythm: normal sinus rhythm  QRS Axis: normal  Intervals: normal  ST/T Wave abnormalities: nonspecific ST changes  Conduction Disutrbances:none  Narrative Interpretation:   Old EKG Reviewed: unchanged    Joya Gaskins, MD 07/13/11 1537

## 2011-07-13 NOTE — ED Notes (Signed)
Family at bedside. 

## 2011-07-13 NOTE — ED Notes (Signed)
Attempted to ambulate patient. Patient stated "just went to the bathroom" "I want to rest before I walk again" Patient O2 stat on room air was 91%. Patient placed on 2L. O2 stat went up to 98%. Will attempt to ambulate in 1 hour.

## 2011-07-13 NOTE — ED Notes (Signed)
MD at bedside. 

## 2011-07-14 DIAGNOSIS — D72829 Elevated white blood cell count, unspecified: Secondary | ICD-10-CM | POA: Diagnosis present

## 2011-07-14 DIAGNOSIS — R739 Hyperglycemia, unspecified: Secondary | ICD-10-CM | POA: Diagnosis not present

## 2011-07-14 LAB — CBC
HCT: 31.1 % — ABNORMAL LOW (ref 36.0–46.0)
Hemoglobin: 10.2 g/dL — ABNORMAL LOW (ref 12.0–15.0)
RBC: 3.55 MIL/uL — ABNORMAL LOW (ref 3.87–5.11)
WBC: 3.7 10*3/uL — ABNORMAL LOW (ref 4.0–10.5)

## 2011-07-14 LAB — INFLUENZA PANEL BY PCR (TYPE A & B): H1N1 flu by pcr: NOT DETECTED

## 2011-07-14 LAB — COMPREHENSIVE METABOLIC PANEL
Albumin: 2.7 g/dL — ABNORMAL LOW (ref 3.5–5.2)
BUN: 11 mg/dL (ref 6–23)
Calcium: 8.8 mg/dL (ref 8.4–10.5)
GFR calc Af Amer: 75 mL/min — ABNORMAL LOW (ref >90)
Glucose, Bld: 171 mg/dL — ABNORMAL HIGH (ref 70–99)
Total Protein: 6.8 g/dL (ref 6.0–8.3)

## 2011-07-14 LAB — IRON AND TIBC
Iron: 26 ug/dL — ABNORMAL LOW (ref 42–135)
Saturation Ratios: 12 % — ABNORMAL LOW (ref 20–55)
TIBC: 226 ug/dL — ABNORMAL LOW (ref 250–470)
UIBC: 200 ug/dL (ref 125–400)

## 2011-07-14 LAB — FOLATE: Folate: 12.8 ng/mL

## 2011-07-14 LAB — RETICULOCYTES: Retic Count, Absolute: 28.4 10*3/uL (ref 19.0–186.0)

## 2011-07-14 MED ORDER — BIOTENE DRY MOUTH MT LIQD
15.0000 mL | Freq: Two times a day (BID) | OROMUCOSAL | Status: DC
  Administered 2011-07-14 – 2011-07-17 (×6): 15 mL via OROMUCOSAL

## 2011-07-14 MED ORDER — ALUM & MAG HYDROXIDE-SIMETH 200-200-20 MG/5ML PO SUSP
30.0000 mL | Freq: Four times a day (QID) | ORAL | Status: DC | PRN
  Administered 2011-07-15: 30 mL via ORAL
  Filled 2011-07-14 (×2): qty 30

## 2011-07-14 NOTE — Progress Notes (Signed)
Subjective: Patient admitted yesterday with SOB and probable PNA.  Patient states she is feeling about the same.  Still SOB, patient also complains of dizziness after her breathing treatment.  No fevers.  Family at bedside reports patient has episodes of confusion/hallucinations but not currently.  Objective: Vital signs in last 24 hours: Filed Vitals:   07/13/11 2105 07/13/11 2312 07/14/11 0656 07/14/11 0836  BP:  119/68 145/81   Pulse: 102 98 80   Temp:  98.9 F (37.2 C) 98.8 F (37.1 C)   TempSrc:  Oral Oral   Resp:  20 20   Height:      Weight:      SpO2:  97% 96% 98%     Intake/Output Summary (Last 24 hours) at 07/14/11 0926 Last data filed at 07/14/11 1610  Gross per 24 hour  Intake 1788.71 ml  Output   1875 ml  Net -86.29 ml    Physical Exam: General: Awake, Oriented, No acute distress. HEENT: EOMI. Neck: Supple, no JVD CV: S1 and S2, RRR, no murmurs Lungs: Rhonchi B/L, no wheezing Abdomen: Soft, Nontender, Nondistended, +bowel sounds. Ext: Good pulses. No edema.   Lab Results:  Basename 07/14/11 0321 07/13/11 1200  NA 136 134*  K 4.1 3.8  CL 103 100  CO2 23 --  GLUCOSE 171* 106*  BUN 11 20  CREATININE 0.95 1.40*  CALCIUM 8.8 --  MG -- --  PHOS -- --    Basename 07/14/11 0321  AST 26  ALT 19  ALKPHOS 65  BILITOT 0.2*  PROT 6.8  ALBUMIN 2.7*     Basename 07/14/11 0321 07/13/11 1200 07/11/11 1525  WBC 3.7* -- 16.3*  NEUTROABS -- -- 13.8*  HGB 10.2* 11.6* --  HCT 31.1* 34.0* --  MCV 87.6 -- 87.4  PLT 201 -- 236    Basename 07/14/11 0321  VITAMINB12 --  FOLATE --  FERRITIN --  TIBC --  IRON --  RETICCTPCT 0.8    Micro Results: No results found for this or any previous visit (from the past 240 hour(s)).  Studies/Results: Dg Chest Port 1 View  07/13/2011  *RADIOLOGY REPORT*  Clinical Data: Short of breath.  Cough and congestion.  PORTABLE CHEST - 1 VIEW  Comparison: 07/11/2011.  Findings: Streaky retrocardiac density remains  present, suspicious for pneumonia.  Appearance of the right lung base is little changed.  Cardiopericardial silhouette and mediastinal contours are within normal limits.  No pleural effusion is identified.  IMPRESSION: Streaky retrocardiac density suspicious for left lower lobe pneumonia.  Little change from 07/11/2011.  Original Report Authenticated By: Andreas Newport, M.D.    Medications: I have reviewed the patient's current medications. Scheduled Meds:    . albuterol  2.5 mg Nebulization Q6H  . albuterol  5 mg Nebulization Once  . albuterol  5 mg Nebulization Once  . antiseptic oral rinse  15 mL Mouth Rinse BID  . cholecalciferol  2,000 Units Oral Daily  . enoxaparin  40 mg Subcutaneous Q24H  . guaiFENesin  600 mg Oral BID  . ipratropium  0.5 mg Nebulization Once  . ipratropium  0.5 mg Nebulization Q6H  . levofloxacin (LEVAQUIN) IV  750 mg Intravenous Once  . levofloxacin (LEVAQUIN) IV  750 mg Intravenous Q24H  . methylPREDNISolone (SOLU-MEDROL) injection  80 mg Intravenous Q6H  . predniSONE  60 mg Oral Once  . sodium chloride  1,000 mL Intravenous Once  . sodium chloride  3 mL Intravenous Q12H   Continuous Infusions:    .  sodium chloride 1,000 mL (07/14/11 0627)   PRN Meds:.sodium chloride, acetaminophen, acetaminophen, albuterol, guaiFENesin-dextromethorphan, ondansetron (ZOFRAN) IV, ondansetron, oxyCODONE, polyethylene glycol, sodium chloride, zolpidem  Assessment/Plan:  1. PNA (pneumonia)- patient on antibiotics for LLL PNA, will check NP swab for flu- probably too late for tamiflu but will be able to remove from Isolation precautions  2. Hyponatremia- resolved with IVF  3. ARF (acute renal failure)- resolved with IVF  4.  Normocytic anemia- stable  5.  Hyperglycemia- secondary to steroids, will begin to wean steroids once wheezing resolved (prob tomm)     LOS: 1 day  Stoney Karczewski, DO 07/14/2011, 9:26 AM

## 2011-07-15 LAB — BASIC METABOLIC PANEL
CO2: 25 mEq/L (ref 19–32)
Calcium: 8.6 mg/dL (ref 8.4–10.5)
Chloride: 108 mEq/L (ref 96–112)
Potassium: 4.2 mEq/L (ref 3.5–5.1)
Sodium: 141 mEq/L (ref 135–145)

## 2011-07-15 MED ORDER — LORATADINE 10 MG PO TABS
10.0000 mg | ORAL_TABLET | Freq: Every day | ORAL | Status: DC
  Administered 2011-07-15 – 2011-07-17 (×3): 10 mg via ORAL
  Filled 2011-07-15 (×3): qty 1

## 2011-07-15 MED ORDER — PREDNISONE 50 MG PO TABS
50.0000 mg | ORAL_TABLET | Freq: Every day | ORAL | Status: DC
  Administered 2011-07-16 – 2011-07-17 (×2): 50 mg via ORAL
  Filled 2011-07-15 (×2): qty 1

## 2011-07-15 MED ORDER — LEVOFLOXACIN 500 MG PO TABS
500.0000 mg | ORAL_TABLET | Freq: Every day | ORAL | Status: DC
  Administered 2011-07-16 – 2011-07-17 (×2): 500 mg via ORAL
  Filled 2011-07-15 (×2): qty 1

## 2011-07-15 MED ORDER — HYDRALAZINE HCL 20 MG/ML IJ SOLN
5.0000 mg | Freq: Once | INTRAMUSCULAR | Status: AC
  Administered 2011-07-15: 5 mg via INTRAVENOUS
  Filled 2011-07-15: qty 1
  Filled 2011-07-15: qty 0.25

## 2011-07-15 MED ORDER — HYDRALAZINE HCL 20 MG/ML IJ SOLN
10.0000 mg | Freq: Four times a day (QID) | INTRAMUSCULAR | Status: DC | PRN
  Administered 2011-07-15 – 2011-07-16 (×2): 10 mg via INTRAVENOUS
  Filled 2011-07-15: qty 1

## 2011-07-15 NOTE — Progress Notes (Signed)
Subjective: Patient admitted with probable PNA.  C/o back spasms with episodes of coughing.  + cough, no Chest pain, no fever, daughter states patient had episode of confusion last night  Objective: Vital signs in last 24 hours: Filed Vitals:   07/14/11 2006 07/14/11 2015 07/15/11 0505 07/15/11 0844  BP:  146/74 174/98   Pulse:  83 94   Temp:  98.8 F (37.1 C) 98.5 F (36.9 C)   TempSrc:  Oral Oral   Resp: 18 19 18    Height:      Weight:      SpO2:  96% 99% 97%     Intake/Output Summary (Last 24 hours) at 07/15/11 1117 Last data filed at 07/15/11 0647  Gross per 24 hour  Intake   2905 ml  Output   1200 ml  Net   1705 ml    Physical Exam: General: Awake, Oriented, No acute distress. HEENT: EOMI. Neck: Supple, no JVD CV: S1 and S2, RRR, no murmurs Lungs: lungs clear, no wheezing Abdomen: Soft, Nontender, Nondistended, +bowel sounds. Ext: Good pulses. trace edema. Skin: no bruising or skin tears  Lab Results:  [facility] 07/15/11 0345 07/14/11 0321  NA 141 136  K 4.2 4.1  CL 108 103  CO2 25 23  GLUCOSE 164* 171*  BUN 14 11  CREATININE 0.93 0.95  CALCIUM 8.6 8.8  MG -- --  PHOS -- --    Basename 07/14/11 0321  AST 26  ALT 19  ALKPHOS 65  BILITOT 0.2*  PROT 6.8  ALBUMIN 2.7*     Basename 07/14/11 0321 07/13/11 1200  WBC 3.7* --  NEUTROABS -- --  HGB 10.2* 11.6*  HCT 31.1* 34.0*  MCV 87.6 --  PLT 201 --    Basename 07/14/11 0321  VITAMINB12 1294*  FOLATE 12.8  FERRITIN 254  TIBC 226*  IRON 26*  RETICCTPCT 0.8    Micro Results: No results found for this or any previous visit (from the past 240 hour(s)).  Studies/Results: Dg Chest Port 1 View  07/13/2011  *RADIOLOGY REPORT*  Clinical Data: Short of breath.  Cough and congestion.  PORTABLE CHEST - 1 VIEW  Comparison: 07/11/2011.  Findings: Streaky retrocardiac density remains present, suspicious for pneumonia.  Appearance of the right lung base is little changed.  Cardiopericardial silhouette  and mediastinal contours are within normal limits.  No pleural effusion is identified.  IMPRESSION: Streaky retrocardiac density suspicious for left lower lobe pneumonia.  Little change from 07/11/2011.  Original Report Authenticated By: Andreas Newport, M.D.    Medications: I have reviewed the patient's current medications. Scheduled Meds:    . albuterol  2.5 mg Nebulization Q6H  . antiseptic oral rinse  15 mL Mouth Rinse BID  . cholecalciferol  2,000 Units Oral Daily  . enoxaparin  40 mg Subcutaneous Q24H  . guaiFENesin  600 mg Oral BID  . hydrALAZINE  5 mg Intravenous Once  . ipratropium  0.5 mg Nebulization Q6H  . levofloxacin  500 mg Oral Daily  . predniSONE  50 mg Oral Q breakfast  . sodium chloride  3 mL Intravenous Q12H  . DISCONTD: levofloxacin (LEVAQUIN) IV  750 mg Intravenous Q24H  . DISCONTD: methylPREDNISolone (SOLU-MEDROL) injection  80 mg Intravenous Q6H   Continuous Infusions:    . sodium chloride 75 mL/hr at 07/14/11 2207   PRN Meds:.sodium chloride, acetaminophen, acetaminophen, albuterol, alum & mag hydroxide-simeth, guaiFENesin-dextromethorphan, ondansetron (ZOFRAN) IV, ondansetron, oxyCODONE, polyethylene glycol, sodium chloride, zolpidem  Assessment/Plan:  1. PNA (pneumonia)- patient  on antibiotics for LLL PNA, will change to PO abx and change steroids to PO as well  2. Hyponatremia- resolved with IVF  3. ARF (acute renal failure)- resolved with IVF  4.  Normocytic anemia- stable  5.  Hyperglycemia- secondary to steroids,   6. Cough- robitussin PRN  7. Dispo- soon    LOS: 2 days  Clebert Wenger, DO 07/15/2011, 11:17 AM

## 2011-07-16 MED ORDER — FUROSEMIDE 10 MG/ML IJ SOLN
40.0000 mg | Freq: Once | INTRAMUSCULAR | Status: AC
  Administered 2011-07-16: 40 mg via INTRAVENOUS
  Filled 2011-07-16: qty 4

## 2011-07-16 MED ORDER — FLUTICASONE PROPIONATE 50 MCG/ACT NA SUSP
1.0000 | Freq: Every day | NASAL | Status: DC
  Administered 2011-07-16 – 2011-07-17 (×2): 1 via NASAL
  Filled 2011-07-16: qty 16

## 2011-07-16 NOTE — Progress Notes (Signed)
Subjective: Feels better, but weak, and has nasal congestion.  Objective: Vital signs in last 24 hours: Temp:  [98.9 F (37.2 C)-99.2 F (37.3 C)] 98.9 F (37.2 C) (02/04 0450) Pulse Rate:  [63-93] 69  (02/04 0450) Resp:  [18-19] 18  (02/04 0450) BP: (128-191)/(69-93) 165/90 mmHg (02/04 0450) SpO2:  [96 %-98 %] 97 % (02/04 0809) Weight change:  Last BM Date: 07/11/11  Intake/Output from previous day: 02/03 0701 - 02/04 0700 In: 900 [P.O.:900] Out: 1300 [Urine:1300] Total I/O In: -  Out: 250 [Urine:250]   Physical Exam: General: Comfortable, alert, communicative, fully oriented, not short of breath at rest.  HEENT:  No clinical pallor, no jaundice, no conjunctival injection or discharge. Hydration status is fair. NECK:  Supple, JVP not seen, no carotid bruits, no palpable lymphadenopathy, no palpable goiter. CHEST:  Has bilateral basal crackles, no wheeze. HEART:  Sounds 1 and 2 heard, normal, regular, no murmurs. ABDOMEN:  Morbidly obese, soft, non-tender, no palpable organomegaly, no palpable masses, normal bowel sounds. GENITALIA:  Not examined. LOWER EXTREMITIES:  No pitting edema, palpable peripheral pulses. MUSCULOSKELETAL SYSTEM:  Generalized osteoarthritic changes, otherwise, normal. CENTRAL NERVOUS SYSTEM:  No focal neurologic deficit on gross examination.  Lab Results:  Basename 07/14/11 0321 07/13/11 1200  WBC 3.7* --  HGB 10.2* 11.6*  HCT 31.1* 34.0*  PLT 201 --    Basename 07/15/11 0345 07/14/11 0321  NA 141 136  K 4.2 4.1  CL 108 103  CO2 25 23  GLUCOSE 164* 171*  BUN 14 11  CREATININE 0.93 0.95  CALCIUM 8.6 8.8   No results found for this or any previous visit (from the past 240 hour(s)).   Studies/Results: No results found.  Medications: Scheduled Meds:   . albuterol  2.5 mg Nebulization Q6H  . antiseptic oral rinse  15 mL Mouth Rinse BID  . cholecalciferol  2,000 Units Oral Daily  . enoxaparin  40 mg Subcutaneous Q24H  . guaiFENesin   600 mg Oral BID  . ipratropium  0.5 mg Nebulization Q6H  . levofloxacin  500 mg Oral Daily  . loratadine  10 mg Oral Daily  . predniSONE  50 mg Oral Q breakfast  . sodium chloride  3 mL Intravenous Q12H   Continuous Infusions:  PRN Meds:.sodium chloride, acetaminophen, acetaminophen, albuterol, alum & mag hydroxide-simeth, guaiFENesin-dextromethorphan, hydrALAZINE, ondansetron (ZOFRAN) IV, ondansetron, oxyCODONE, polyethylene glycol, sodium chloride, zolpidem  Assessment/Plan: 1. PNA (pneumonia): LLL community-acquired, complicating influenza A. Now on day# 4 Levaquin. Transitioned to oral antibiotic, today. Now on oral steroid taper. 2. Hyponatremia: Mild, associated with dehydration. Now resolved with iv fluids.   3. ARF (acute renal failure): Patient actually had acute renal insufficiency, due to dehydration. Now resolved. Iv Fluids were discontinued on 07/15/11. Patient appears mildly volume overloaded today. Will give one dose of iv Lasix, 40 mg.  4. Normocytic anemia: Stable  5. Hyperglycemia: Secondary to steroids. CBG is reasonable at this time. 6. Influenza A. This was confirmed by Flu PCR. Patient presented outside the window for TamiFlu, so this was not utilized.  Comment: OOB/Mobilize.  Will give Flonase, for nasal congestion.      LOS: 3 days   Renji Berwick,CHRISTOPHER 07/16/2011, 11:32 AM

## 2011-07-16 NOTE — Progress Notes (Signed)
   CARE MANAGEMENT NOTE 07/16/2011  Patient:  Denise Wright, Denise Wright   Account Number:  [id number]  Date Initiated:  07/16/2011  Documentation initiated by:  Lanier Clam  Subjective/Objective Assessment:   ADMITTED W/COUGH.PNA.FAILED OTPT TREATMENT.     Action/Plan:   FROM HOME W/FAMILY,HAS PCP,PHARMACY,TRANSP.   Anticipated DC Date:  07/23/2011   Anticipated DC Plan:  HOME/SELF CARE         Choice offered to / List presented to:             Status of service:  In process, will continue to follow Medicare Important Message given?   (If response is "NO", the following Medicare IM given date fields will be blank) Date Medicare IM given:   Date Additional Medicare IM given:    Discharge Disposition:    Per UR Regulation:  Reviewed for med. necessity/level of care/duration of stay  Comments:  07/16/11 [facility] RN,BSN NCM [phone]

## 2011-07-17 DIAGNOSIS — H6123 Impacted cerumen, bilateral: Secondary | ICD-10-CM | POA: Diagnosis present

## 2011-07-17 LAB — BASIC METABOLIC PANEL
CO2: 27 mEq/L (ref 19–32)
Chloride: 103 mEq/L (ref 96–112)
Creatinine, Ser: 1 mg/dL (ref 0.50–1.10)
Sodium: 141 mEq/L (ref 135–145)

## 2011-07-17 LAB — CBC
MCV: 87.8 fL (ref 78.0–100.0)
Platelets: 218 10*3/uL (ref 150–400)
RBC: 3.95 MIL/uL (ref 3.87–5.11)
WBC: 8 10*3/uL (ref 4.0–10.5)

## 2011-07-17 MED ORDER — CARBAMIDE PEROXIDE 6.5 % OT SOLN: 5.0000 [drp] | Freq: Two times a day (BID) | OTIC | Status: AC

## 2011-07-17 MED ORDER — IPRATROPIUM BROMIDE 0.02 % IN SOLN
0.5000 mg | Freq: Three times a day (TID) | RESPIRATORY_TRACT | Status: DC
  Administered 2011-07-17: 0.5 mg via RESPIRATORY_TRACT
  Filled 2011-07-17: qty 2.5

## 2011-07-17 MED ORDER — FLUTICASONE PROPIONATE 50 MCG/ACT NA SUSP: 1.0000 | Freq: Every day | NASAL | Status: DC

## 2011-07-17 MED ORDER — PREDNISONE 10 MG PO TABS: ORAL_TABLET | ORAL | Status: AC

## 2011-07-17 MED ORDER — ALBUTEROL SULFATE (5 MG/ML) 0.5% IN NEBU
2.5000 mg | INHALATION_SOLUTION | Freq: Three times a day (TID) | RESPIRATORY_TRACT | Status: DC
  Administered 2011-07-17: 2.5 mg via RESPIRATORY_TRACT
  Filled 2011-07-17: qty 0.5

## 2011-07-17 MED ORDER — GUAIFENESIN ER 600 MG PO TB12: 600.0000 mg | ORAL_TABLET | Freq: Two times a day (BID) | ORAL | Status: AC

## 2011-07-17 NOTE — Discharge Summary (Signed)
Physician Discharge Summary  Patient ID: Denise Wright MRN: [id number] DOB/AGE: [birth year] 61 y.o.  Admit date: 07/13/2011 Discharge date: 07/17/2011  Primary Care Physician:  Alva Garnet., MD, MD   Discharge Diagnoses:    Patient Active Problem List  Diagnoses  . PNA (pneumonia)  . Hyponatremia  . ARF (acute renal failure)  . Normocytic anemia  . Hyperglycemia  . Leukocytosis  . Impacted cerumen of both ears    Medication List  As of 07/17/2011 12:52 PM   STOP taking these medications         HYDROcodone-homatropine 5-1.5 MG/5ML syrup      ondansetron 4 MG tablet         TAKE these medications         carbamide peroxide 6.5 % otic solution   Commonly known as: DEBROX   Place 5 drops into both ears 2 (two) times daily.      cholecalciferol 1000 UNITS tablet   Commonly known as: VITAMIN D   Take 2,000 Units by mouth daily.      fluticasone 50 MCG/ACT nasal spray   Commonly known as: FLONASE   Place 1 spray into the nose daily.      guaiFENesin 600 MG 12 hr tablet   Commonly known as: MUCINEX   Take 1 tablet (600 mg total) by mouth 2 (two) times daily.      levofloxacin 500 MG tablet   Commonly known as: LEVAQUIN   Take 1.5 tablets (750 mg total) by mouth daily.      predniSONE 10 MG tablet   Commonly known as: DELTASONE   Take 40 mg daily for 3 days, then 30 mg daily for 3 days, then 20 mg daily for 3 days, then 10 mg daily for 3 days, then stop.             Disposition and Follow-up: Follow up with primary MD.  Consults:  None None.   Significant Diagnostic Studies:  Dg Chest Port 1 View  07/13/2011  *RADIOLOGY REPORT*  Clinical Data: Short of breath.  Cough and congestion.  PORTABLE CHEST - 1 VIEW  Comparison: 07/11/2011.  Findings: Streaky retrocardiac density remains present, suspicious for pneumonia.  Appearance of the right lung base is little changed.  Cardiopericardial silhouette and mediastinal contours are within normal limits.  No  pleural effusion is identified.  IMPRESSION: Streaky retrocardiac density suspicious for left lower lobe pneumonia.  Little change from 07/11/2011.  Original Report Authenticated By: Andreas Newport, M.D.   Brief H and P: For complete details, refer to admission H and P. However, in brief, this is a 61 year old female, with recently diagnosed influenza and pneumonia, who originally presented to the ED on 07/11/11 and was sent home with Levaquin and Tamiflu. Because of continuing/escalating symptoms, she returned to the ED, and was admitted for further evaluation, investigation and management.  Physical Exam: On 07/17/11. General: Comfortable, feels much better today, alert, communicative, fully oriented, not short of breath at rest.  HEENT: No clinical pallor, no jaundice, no conjunctival injection or discharge. Hydration status is fair. Otoscopic examination revealed bilateral impacted cerumen in external auditory canal. NECK: Supple, JVP not seen, no carotid bruits, no palpable lymphadenopathy, no palpable goiter.  CHEST: Clinically clear, no wheeze, no crackles.  HEART: Sounds 1 and 2 heard, normal, regular, no murmurs.  ABDOMEN: Morbidly obese, soft, non-tender, no palpable organomegaly, no palpable masses, normal bowel sounds.  GENITALIA: Not examined.  LOWER EXTREMITIES: No pitting edema, palpable  peripheral pulses.  MUSCULOSKELETAL SYSTEM: Generalized osteoarthritic changes, otherwise, normal.  CENTRAL NERVOUS SYSTEM: No focal neurologic deficit on gross examination.   Hospital Course:  1. PNA (pneumonia): LLL community-acquired, complicating influenza A. Patient was managed with antibiotics, nebulizers and steroids. Now on day# 5 of a planned 10 day course of Levaquin. Initially managed with iv antibiotics, but transitioned on 07/16/11, to the oral version, without any deleterious effects. Patient has remained apyrexial, phlegm is much clearer, wcc has normalized, and wellbeing has improved. Now  on oral steroid taper.  2. Hyponatremia: This was noted on admission, was mild, associated with dehydration. Now resolved with iv fluids.  3. ARF (acute renal failure): Patient actually had acute renal insufficiency, due to dehydration. Now resolved. Iv Fluids were discontinued on 07/15/11. Patient appeared mildly volume overloaded on 07/16/11, but this resolved with a single intravenous dose of Lasix.  4. Normocytic anemia: Stable  5. Hyperglycemia: Secondary to steroids.  Resolved with discontinuation of iv steroids..  6. Influenza A. This was confirmed by Flu PCR. Patient presented outside the window for TamiFlu, so this was not utilized. Flonase was utilized for relief of nasal congestion.  7. Impacted Cerumen: Bilateral, confirmed by otoscopic examination. Patient has been placed on Debrox, and may follow up with primary MD, for ear syringing, if necessary.  Comment: patient is clinically stable for discharge on 07/17/11.  Time spent on Discharge: 45 mins.  Signed: Adelayde Minney,CHRISTOPHER 07/17/2011, 12:52 PM

## 2011-07-17 NOTE — Progress Notes (Signed)
Patient came in for treatment of flulike sysmtoms was released home, returned to ED on 07/13/2011 for unresolved symptoms and was admitted. No respiratory or smoking history. Not requiring any oxygen. Patient in no distress. Therapist will change nebulizer treatments to TID.

## 2014-03-13 ENCOUNTER — Emergency Department (HOSPITAL_COMMUNITY)
Admission: EM | Admit: 2014-03-13 | Discharge: 2014-03-13 | Disposition: A | Discharge status: Home / Self Care | Payer: 59 | Source: Home / Self Care | Attending: Family Medicine | Admitting: Family Medicine

## 2014-03-13 ENCOUNTER — Encounter (HOSPITAL_COMMUNITY): Payer: Self-pay | Admitting: Emergency Medicine

## 2014-03-13 DIAGNOSIS — K05219 Aggressive periodontitis, localized, unspecified severity: Secondary | ICD-10-CM

## 2014-03-13 DIAGNOSIS — K088 Other specified disorders of teeth and supporting structures: Secondary | ICD-10-CM

## 2014-03-13 DIAGNOSIS — K0520 Aggressive periodontitis, unspecified: Secondary | ICD-10-CM

## 2014-03-13 DIAGNOSIS — K0889 Other specified disorders of teeth and supporting structures: Secondary | ICD-10-CM

## 2014-03-13 MED ORDER — CLINDAMYCIN HCL 150 MG PO CAPS: 150.0000 mg | ORAL_CAPSULE | Freq: Three times a day (TID) | ORAL | Status: AC

## 2014-03-13 MED ORDER — TRAMADOL HCL 50 MG PO TABS: 50.0000 mg | ORAL_TABLET | Freq: Four times a day (QID) | ORAL | Status: DC | PRN

## 2014-03-13 MED ORDER — DICLOFENAC SODIUM 50 MG PO TBEC: 50.0000 mg | DELAYED_RELEASE_TABLET | Freq: Two times a day (BID) | ORAL | Status: AC

## 2014-03-13 NOTE — ED Provider Notes (Signed)
Medical screening examination/treatment/procedure(s) were performed by non-physician practitioner and as supervising physician I was immediately available for consultation/collaboration.  Philipp Deputy, M.D.  Harden Mo, MD 03/13/14 1754

## 2014-03-13 NOTE — ED Provider Notes (Signed)
CSN: [id number]     Arrival date & time 03/13/14  1012 History   First MD Initiated Contact with Patient 03/13/14 1024     Chief Complaint  Patient presents with  . Dental Pain   (Consider location/radiation/quality/duration/timing/severity/associated sxs/prior Treatment) HPI Comments: Denise Wright presents today with a painful left upper tooth. It has been breaking off for a "while"; but pain started last week and has slowly progressed. No fever or chills. Possible mild swelling in the left upper cheek and into left ear. No drainage.   Patient is a 63 y.o. female presenting with tooth pain. The history is provided by the patient.  Dental Pain   Past Medical History  Diagnosis Date  . Pneumonia    Past Surgical History  Procedure Laterality Date  . Abdominal hysterectomy  1983  . Tonsillectomy  1965   Family History  Problem Relation Age of Onset  . Diabetes Mother   . Hypertension Mother   . Hypertension Father   . Leukemia Father    History  Substance Use Topics  . Smoking status: Never Smoker   . Smokeless tobacco: Never Used  . Alcohol Use: No   OB History   Grav Para Term Preterm Abortions TAB SAB Ect Mult Living                 Review of Systems  All other systems reviewed and are negative.   Allergies  Penicillins  Home Medications   Prior to Admission medications   Medication Sig Start Date End Date Taking? Authorizing Provider  cholecalciferol (VITAMIN D) 1000 UNITS tablet Take 2,000 Units by mouth daily.   Yes Historical Provider, MD  fluticasone (FLONASE) 50 MCG/ACT nasal spray Place 1 spray into both nostrils daily. OTC   Yes Historical Provider, MD  Multiple Vitamins-Minerals (MULTIVITAMIN WITH MINERALS) tablet Take 1 tablet by mouth daily.   Yes Historical Provider, MD  Specialty Vitamins Products (MAGNESIUM, AMINO ACID CHELATE,) 133 MG tablet Take 1 tablet by mouth 2 (two) times daily. States its 250 mg.   Yes Historical Provider, MD  clindamycin  (CLEOCIN) 150 MG capsule Take 1 capsule (150 mg total) by mouth 3 (three) times daily. 03/13/14 03/17/14  Bjorn Pippin, PA-C  diclofenac (VOLTAREN) 50 MG EC tablet Take 1 tablet (50 mg total) by mouth 2 (two) times daily. 03/13/14 03/16/14  Bjorn Pippin, PA-C  fluticasone (FLONASE) 50 MCG/ACT nasal spray Place 1 spray into both nostrils daily. 07/17/11 07/16/12  Monika Salk, MD  traMADol (ULTRAM) 50 MG tablet Take 1 tablet (50 mg total) by mouth every 6 (six) hours as needed for moderate pain. 03/13/14   Bjorn Pippin, PA-C   BP 156/51  Pulse 77  Temp(Src) 98.4 F (36.9 C) (Oral)  Resp 16  SpO2 96% Physical Exam  Nursing note and vitals reviewed. Constitutional: She is oriented to person, place, and time. She appears well-developed and well-nourished. No distress.  HENT:  Head: Normocephalic and atraumatic.  Left Ear: External ear normal.  Mouth/Throat: Oropharynx is clear and moist.  Left upper back molar broken, with odor, gum mild swelling and pain with palpation  Neck: Normal range of motion.  Lymphadenopathy:    She has no cervical adenopathy.  Neurological: She is alert and oriented to person, place, and time.  Skin: Skin is warm and dry. She is not diaphoretic.  Psychiatric: Her behavior is normal.    ED Course  Procedures (including critical care time) Labs Review Labs Reviewed -  No data to display  Imaging Review No results found.   MDM   1. Pain, dental   2. Abscess of upper gum    Treat with abx-Clinda given in PCN allergy. No history with cephalosporins. NSAID's given with Tramadol for moderate pain. F/U with Dentist on Wednesday as scheduled.     Bjorn Pippin, PA-C 03/13/14 1059

## 2014-03-13 NOTE — Discharge Instructions (Signed)
Abscessed Tooth An abscessed tooth is an infection around your tooth. It may be caused by holes or damage to the tooth (cavity) or a dental disease. An abscessed tooth causes mild to very bad pain in and around the tooth. See your dentist right away if you have tooth or gum pain. HOME CARE  Take your medicine as told. Finish it even if you start to feel better.  Do not drive after taking pain medicine.  Rinse your mouth (gargle) often with salt water ( teaspoon salt in 8 ounces of warm water).  Do not apply heat to the outside of your face. GET HELP RIGHT AWAY IF:   You have a temperature by mouth above 102 F (38.9 C), not controlled by medicine.  You have chills and a very bad headache.  You have problems breathing or swallowing.  Your mouth will not open.  You develop puffiness (swelling) on the neck or around the eye.  Your pain is not helped by medicine.  Your pain is getting worse instead of better. MAKE SURE YOU:   Understand these instructions.  Will watch your condition.  Will get help right away if you are not doing well or get worse. Document Released: 11/14/2007 Document Revised: 08/20/2011 Document Reviewed: 09/05/2010 [employer] [facility] Patient Information 2015 [place], [place]. This information is not intended to replace advice given to you by your health care provider. Make sure you discuss any questions you have with your health care provider.    Take Diclofenac twice a day through Tuesday, this is for pain and inflammation. If the pain is moderate then may take Tramadol with this medication for pain. Take all antibiotics through dental appt. Then regimen per your dentist.

## 2014-03-13 NOTE — ED Notes (Signed)
C/o of L uper jaw toothache onset last week.  Has an appt. With her dentist on Wed.  Tooth broke off a few months ago.

## 2019-08-03 ENCOUNTER — Ambulatory Visit: Payer: Self-pay | Attending: Family

## 2019-08-03 DIAGNOSIS — Z23 Encounter for immunization: Secondary | ICD-10-CM | POA: Insufficient documentation

## 2019-08-03 NOTE — Progress Notes (Signed)
   Covid-19 Vaccination Clinic  Name:  Denise Wright    MRN: [id number] DOB: Dec 27, 1950  08/03/2019  [name] was observed post Covid-19 immunization for 15 minutes without incidence. She was provided with Vaccine Information Sheet and instruction to access the V-Safe system.   [name] was instructed to call 911 with any severe reactions post vaccine: Marland Kitchen Difficulty breathing  . Swelling of your face and throat  . A fast heartbeat  . A bad rash all over your body  . Dizziness and weakness    Immunizations Administered    Name Date Dose VIS Date Route   Moderna COVID-19 Vaccine 08/03/2019  2:25 PM 0.5 mL 05/12/2019 Intramuscular   Manufacturer: Moderna   Lot: [id number]   [id number]

## 2019-09-08 ENCOUNTER — Ambulatory Visit: Payer: Self-pay | Attending: Internal Medicine

## 2019-09-08 DIAGNOSIS — Z23 Encounter for immunization: Secondary | ICD-10-CM

## 2019-09-08 NOTE — Progress Notes (Signed)
   Covid-19 Vaccination Clinic  Name:  Denise Wright    MRN: [id number] DOB: 11/30/1950  09/08/2019  [name] was observed post Covid-19 immunization for 15 minutes without incident. She was provided with Vaccine Information Sheet and instruction to access the V-Safe system.   [name] was instructed to call 911 with any severe reactions post vaccine: Marland Kitchen Difficulty breathing  . Swelling of face and throat  . A fast heartbeat  . A bad rash all over body  . Dizziness and weakness   Immunizations Administered    Name Date Dose VIS Date Route   Moderna COVID-19 Vaccine 09/08/2019  2:38 PM 0.5 mL 05/12/2019 Intramuscular   Manufacturer: Moderna   Lot: [id number]   [id number]

## 2020-08-31 DIAGNOSIS — E782 Mixed hyperlipidemia: Secondary | ICD-10-CM | POA: Insufficient documentation

## 2020-08-31 NOTE — Progress Notes (Signed)
Patient referred by Willey Blade, MD for hyperlipidemia  Subjective:   Denise Wright, female    DOB: Feb 13, 1951, 70 y.o.   MRN: [id number]   Chief Complaint  Patient presents with  . Hyperlipidemia  . Coronary Artery Disease     HPI  70 y.o. African American female with hypertension, mixed hyperlipidemia  Patient works as Buyer, retail at [employer].  She has been at the same job for last 60 years.  She is to walk more in the past, but activity has slowed down.  She denies any chest pain, shortness of breath, tachycardia, leg edema symptoms.  She is lifetime non-smoker, but father used to smoke.  She is prediabetic.  Blood pressure is well controlled.  Reviewed lipid panel results with the patient.  Overall, she is reluctant to start statin therapy at this time.   Past Medical History:  Diagnosis Date  . Pneumonia      Past Surgical History:  Procedure Laterality Date  . ABDOMINAL HYSTERECTOMY  1983  . TONSILLECTOMY  1965     Social History   Tobacco Use  Smoking Status Never Smoker  Smokeless Tobacco Never Used    Social History   Substance and Sexual Activity  Alcohol Use No     Family History  Problem Relation Age of Onset  . Diabetes Mother   . Hypertension Mother   . Hypertension Father   . Leukemia Father      Current Outpatient Medications on File Prior to Visit  Medication Sig Dispense Refill  . cholecalciferol (VITAMIN D) 1000 UNITS tablet Take 2,000 Units by mouth daily.    . fluticasone (FLONASE) 50 MCG/ACT nasal spray Place 1 spray into both nostrils daily. OTC    . fluticasone (FLONASE) 50 MCG/ACT nasal spray Place 1 spray into both nostrils daily.    . Multiple Vitamins-Minerals (MULTIVITAMIN WITH MINERALS) tablet Take 1 tablet by mouth daily.    Marland Kitchen Specialty Vitamins Products (MAGNESIUM, AMINO ACID CHELATE,) 133 MG tablet Take 1 tablet by mouth 2 (two) times daily. States its 250 mg.    . traMADol  (ULTRAM) 50 MG tablet Take 1 tablet (50 mg total) by mouth every 6 (six) hours as needed for moderate pain. 20 tablet 0   No current facility-administered medications on file prior to visit.    Cardiovascular and other pertinent studies:  EKG 09/01/2020: Sinus rhythm 78 bpm Possible old anteroseptal infarct  Recent labs: 05/28/2020: Glucose 97, BUN/Cr 18/1.15. EGFR 56. Na/K 142/3.9.  Alkaline Phospatase: 134 H/H 12.3/36.5. MCV 90. Platelets 304 HbA1C 6.1% Chol 197, TG 49, HDL 62, LDL 126 LDL-P 1374 (<1000) TSH 2.7 normal    Review of Systems  Cardiovascular: Negative for chest pain, dyspnea on exertion, leg swelling, palpitations and syncope.         Vitals:   09/01/20 1025  BP: 135/75  Pulse: 75  Temp: 98 F (36.7 C)  SpO2: 96%     Body mass index is 36.05 kg/m. Filed Weights   09/01/20 1025  Weight: 210 lb (95.3 kg)     Objective:   Physical Exam Vitals and nursing note reviewed.  Constitutional:      General: She is not in acute distress. Neck:     Vascular: No JVD.  Cardiovascular:     Rate and Rhythm: Normal rate and regular rhythm.     Heart sounds: Normal heart sounds. No murmur heard.   Pulmonary:     Effort: Pulmonary effort  is normal.     Breath sounds: Normal breath sounds. No wheezing or rales.          Assessment & Recommendations:   70 y.o. African American female with hypertension, mixed hyperlipidemia  Hyperlipidemia: LDL above goal, but HDL is excellent.  LDL-P is elevated.  I discussed statin therapy, in addition to diagnostic modifications.  She is unsure about starting statin therapy at this time.  Will obtain calcium score scan for further stratification.  If calcium score is elevated, will start on rosuvastatin 20 mg daily.  Further recommendations after above testing.  Thank you for referring the patient to [place]. Please feel free to contact with any questions.   Nigel Mormon, MD Pager: [phone] Office:  [phone]

## 2020-09-01 ENCOUNTER — Encounter: Payer: Self-pay | Admitting: Cardiology

## 2020-09-01 ENCOUNTER — Other Ambulatory Visit: Payer: Self-pay

## 2020-09-01 ENCOUNTER — Ambulatory Visit: Payer: 59 | Admitting: Cardiology

## 2020-09-01 VITALS — BP 135/75 | HR 75 | Temp 98.0°F | Ht 64.0 in | Wt 210.0 lb

## 2020-09-01 DIAGNOSIS — E782 Mixed hyperlipidemia: Secondary | ICD-10-CM

## 2020-09-02 ENCOUNTER — Ambulatory Visit: Payer: Self-pay | Admitting: Cardiology

## 2020-09-15 NOTE — Progress Notes (Signed)
Called and spoke with patient regarding her calcium score. Patient aware to hold statin for now.

## 2021-05-25 DIAGNOSIS — I10 Essential (primary) hypertension: Secondary | ICD-10-CM | POA: Diagnosis present

## 2021-06-29 ENCOUNTER — Inpatient Hospital Stay (HOSPITAL_BASED_OUTPATIENT_CLINIC_OR_DEPARTMENT_OTHER)
Admission: EM | Admit: 2021-06-29 | Discharge: 2021-07-12 | DRG: 025 | Disposition: A | Discharge status: Rehabilitation Facility | Payer: BC Managed Care – PPO | Attending: Neurological Surgery | Admitting: Neurological Surgery

## 2021-06-29 ENCOUNTER — Emergency Department (HOSPITAL_BASED_OUTPATIENT_CLINIC_OR_DEPARTMENT_OTHER): Payer: BC Managed Care – PPO

## 2021-06-29 ENCOUNTER — Encounter (HOSPITAL_BASED_OUTPATIENT_CLINIC_OR_DEPARTMENT_OTHER): Payer: Self-pay

## 2021-06-29 ENCOUNTER — Other Ambulatory Visit: Payer: Self-pay

## 2021-06-29 DIAGNOSIS — C710 Malignant neoplasm of cerebrum, except lobes and ventricles: Secondary | ICD-10-CM | POA: Diagnosis not present

## 2021-06-29 DIAGNOSIS — R5383 Other fatigue: Secondary | ICD-10-CM | POA: Diagnosis not present

## 2021-06-29 DIAGNOSIS — G9389 Other specified disorders of brain: Secondary | ICD-10-CM

## 2021-06-29 DIAGNOSIS — E785 Hyperlipidemia, unspecified: Secondary | ICD-10-CM | POA: Diagnosis present

## 2021-06-29 DIAGNOSIS — G8194 Hemiplegia, unspecified affecting left nondominant side: Secondary | ICD-10-CM | POA: Diagnosis present

## 2021-06-29 DIAGNOSIS — Z8616 Personal history of COVID-19: Secondary | ICD-10-CM

## 2021-06-29 DIAGNOSIS — I10 Essential (primary) hypertension: Secondary | ICD-10-CM | POA: Diagnosis present

## 2021-06-29 DIAGNOSIS — Z823 Family history of stroke: Secondary | ICD-10-CM

## 2021-06-29 DIAGNOSIS — E782 Mixed hyperlipidemia: Secondary | ICD-10-CM | POA: Diagnosis present

## 2021-06-29 DIAGNOSIS — D649 Anemia, unspecified: Secondary | ICD-10-CM | POA: Diagnosis present

## 2021-06-29 DIAGNOSIS — Z79890 Hormone replacement therapy (postmenopausal): Secondary | ICD-10-CM

## 2021-06-29 DIAGNOSIS — Z8249 Family history of ischemic heart disease and other diseases of the circulatory system: Secondary | ICD-10-CM

## 2021-06-29 DIAGNOSIS — E039 Hypothyroidism, unspecified: Secondary | ICD-10-CM | POA: Diagnosis present

## 2021-06-29 DIAGNOSIS — G936 Cerebral edema: Secondary | ICD-10-CM | POA: Diagnosis present

## 2021-06-29 DIAGNOSIS — Z880 Allergy status to penicillin: Secondary | ICD-10-CM

## 2021-06-29 DIAGNOSIS — Z20822 Contact with and (suspected) exposure to covid-19: Secondary | ICD-10-CM | POA: Diagnosis present

## 2021-06-29 DIAGNOSIS — Z79899 Other long term (current) drug therapy: Secondary | ICD-10-CM

## 2021-06-29 DIAGNOSIS — Z833 Family history of diabetes mellitus: Secondary | ICD-10-CM

## 2021-06-29 DIAGNOSIS — R29810 Facial weakness: Secondary | ICD-10-CM | POA: Diagnosis not present

## 2021-06-29 DIAGNOSIS — Z888 Allergy status to other drugs, medicaments and biological substances status: Secondary | ICD-10-CM

## 2021-06-29 DIAGNOSIS — E876 Hypokalemia: Secondary | ICD-10-CM | POA: Diagnosis present

## 2021-06-29 DIAGNOSIS — R4781 Slurred speech: Secondary | ICD-10-CM | POA: Diagnosis not present

## 2021-06-29 DIAGNOSIS — Z806 Family history of leukemia: Secondary | ICD-10-CM

## 2021-06-29 LAB — CBC
HCT: 33.1 % — ABNORMAL LOW (ref 36.0–46.0)
Hemoglobin: 10.6 g/dL — ABNORMAL LOW (ref 12.0–15.0)
MCH: 29.4 pg (ref 26.0–34.0)
MCHC: 32 g/dL (ref 30.0–36.0)
MCV: 91.9 fL (ref 80.0–100.0)
Platelets: 335 10*3/uL (ref 150–400)
RBC: 3.6 MIL/uL — ABNORMAL LOW (ref 3.87–5.11)
RDW: 13.6 % (ref 11.5–15.5)
WBC: 5.8 10*3/uL (ref 4.0–10.5)
nRBC: 0 % (ref 0.0–0.2)

## 2021-06-29 LAB — URINALYSIS, ROUTINE W REFLEX MICROSCOPIC
Bilirubin Urine: NEGATIVE
Glucose, UA: NEGATIVE mg/dL
Hgb urine dipstick: NEGATIVE
Ketones, ur: NEGATIVE mg/dL
Nitrite: NEGATIVE
Protein, ur: NEGATIVE mg/dL
Specific Gravity, Urine: 1.018 (ref 1.005–1.030)
pH: 6 (ref 5.0–8.0)

## 2021-06-29 LAB — BASIC METABOLIC PANEL
Anion gap: 8 (ref 5–15)
BUN: 20 mg/dL (ref 8–23)
CO2: 31 mmol/L (ref 22–32)
Calcium: 9.1 mg/dL (ref 8.9–10.3)
Chloride: 101 mmol/L (ref 98–111)
Creatinine, Ser: 1.06 mg/dL — ABNORMAL HIGH (ref 0.44–1.00)
GFR, Estimated: 57 mL/min — ABNORMAL LOW (ref >60)
Glucose, Bld: 100 mg/dL — ABNORMAL HIGH (ref 70–99)
Potassium: 3.4 mmol/L — ABNORMAL LOW (ref 3.5–5.1)
Sodium: 140 mmol/L (ref 135–145)

## 2021-06-29 LAB — RESP PANEL BY RT-PCR (FLU A&B, COVID) ARPGX2
Influenza A by PCR: NEGATIVE
Influenza B by PCR: NEGATIVE
SARS Coronavirus 2 by RT PCR: NEGATIVE

## 2021-06-29 LAB — CBG MONITORING, ED: Glucose-Capillary: 101 mg/dL — ABNORMAL HIGH (ref 70–99)

## 2021-06-29 MED ORDER — DEXAMETHASONE SODIUM PHOSPHATE 4 MG/ML IJ SOLN
4.0000 mg | Freq: Once | INTRAMUSCULAR | Status: AC
  Administered 2021-06-29: 4 mg via INTRAVENOUS
  Filled 2021-06-29: qty 1

## 2021-06-29 MED ORDER — LEVETIRACETAM IN NACL 500 MG/100ML IV SOLN
500.0000 mg | Freq: Once | INTRAVENOUS | Status: AC
  Administered 2021-06-30: 500 mg via INTRAVENOUS
  Filled 2021-06-29: qty 100

## 2021-06-29 NOTE — ED Notes (Signed)
Patient transported to CT 

## 2021-06-29 NOTE — ED Triage Notes (Signed)
Pt c/o fatigue & feeling "dis-combobulated" since oral surgery on 06/16/2021. Pt reports she fell on Christmas night when she tripped over a box of fruit on the floor but did not hit head. Pt denies LOC. Pt reports PCP told pt to come to ED for further evaluation.

## 2021-06-29 NOTE — ED Provider Notes (Signed)
[facility] EMERGENCY DEPT Provider Note   CSN: [id number] Arrival date & time: 06/29/21  2021     History  Chief Complaint  Patient presents with   Fatigue    Denise Wright is a 71 y.o. female presented to ED with generalized fatigue, confusion, loss of energy.  Patient ports that she tested positive for COVID on January 8.  She had an oral surgery 2 days beforehand for tooth extraction.  She has been taking clindamycin since oral surgery as a preventative measure for infection.  She says that around January night she began having sensations of headaches, myalgias, weakness.  She feels like she has gradually recovered, although she did have some headaches yesterday evening that were frontal.  She feels that her energy level slowly coming back but she still is more fatigued than normal.  Her family member at bedside expresses concern that the patient has been quite forgetful, which is very abnormal, spacey headed, seem to get lost driving the other day.  Their PCP wanted her to come in for CT scan of the brain.  The patient denies any prior history of stroke, or any localizing numbness or weakness of the arms or legs.  HPI     Home Medications Prior to Admission medications   Medication Sig Start Date End Date Taking? Authorizing Provider  b complex vitamins capsule Take 1 capsule by mouth daily.    [provider]  cholecalciferol (VITAMIN D) 1000 UNITS tablet Take 2,000 Units by mouth daily.    [provider]  fluticasone (FLONASE) 50 MCG/ACT nasal spray Place 1 spray into both nostrils daily. OTC    [provider]  hydrochlorothiazide (HYDRODIURIL) 25 MG tablet Take 25 mg by mouth daily. 06/30/20   [provider]  levothyroxine (SYNTHROID) 50 MCG tablet Take 50 mcg by mouth daily.    [provider]  Multiple Vitamins-Minerals (MULTIVITAMIN WITH MINERALS) tablet Take 1 tablet by mouth daily.    [provider]       Allergies    Fexofenadine and Penicillins    Review of Systems   Review of Systems  Physical Exam Updated Vital Signs BP 120/65    Pulse 82    Temp 98.9 F (37.2 C) (Oral)    Resp 18    Ht 5\' 2"  (1.575 m)    Wt 95.3 kg    SpO2 100%    BMI 38.41 kg/m  Physical Exam Constitutional:      General: She is not in acute distress. HENT:     Head: Normocephalic and atraumatic.  Eyes:     Conjunctiva/sclera: Conjunctivae normal.     Pupils: Pupils are equal, round, and reactive to light.  Cardiovascular:     Rate and Rhythm: Normal rate and regular rhythm.  Pulmonary:     Effort: Pulmonary effort is normal. No respiratory distress.  Skin:    General: Skin is warm and dry.  Neurological:     General: No focal deficit present.     Mental Status: She is alert and oriented to person, place, and time. Mental status is at baseline.     Sensory: No sensory deficit.     Motor: No weakness.  Psychiatric:        Mood and Affect: Mood normal.        Behavior: Behavior normal.    ED Results / Procedures / Treatments   Labs (all labs ordered are listed, but only abnormal results are displayed) Labs Reviewed  BASIC METABOLIC PANEL - Abnormal; Notable for the following components:      Result Value   Potassium 3.4 (*)    Glucose, Bld 100 (*)    Creatinine, Ser 1.06 (*)    GFR, Estimated 57 (*)    All other components within normal limits  CBC - Abnormal; Notable for the following components:   RBC 3.60 (*)    Hemoglobin 10.6 (*)    HCT 33.1 (*)    All other components within normal limits  URINALYSIS, ROUTINE W REFLEX MICROSCOPIC - Abnormal; Notable for the following components:   Leukocytes,Ua TRACE (*)    Bacteria, UA RARE (*)    All other components within normal limits  CBG MONITORING, ED - Abnormal; Notable for the following components:   Glucose-Capillary 101 (*)    All other components within normal limits  RESP PANEL BY RT-PCR (FLU A&B, COVID) [id number]    EKG EKG  Interpretation  Date/Time:  Thursday June 29 2021 20:48:18 EST Ventricular Rate:  87 PR Interval:  132 QRS Duration: 76 QT Interval:  364 QTC Calculation: 438 R Axis:   75 Text Interpretation: Normal sinus rhythm Normal ECG Confirmed by Merrily Pew [phone]) on 06/29/2021 11:02:01 PM  Radiology CT Head Wo Contrast  Result Date: 06/29/2021 CLINICAL DATA:  t c/o fatigue AND feeling "dis-combobulated" since oral surgery on 06/16/2021. Pt reports she fell on Christmas night when she tripped over a box of fruit on the floor but did not hit head. EXAM: CT HEAD WITHOUT CONTRAST TECHNIQUE: Contiguous axial images were obtained from the base of the skull through the vertex without intravenous contrast. RADIATION DOSE REDUCTION: This exam was performed according to the departmental dose-optimization program which includes automated exposure control, adjustment of the mA and/or kV according to patient size and/or use of iterative reconstruction technique. COMPARISON:  None. FINDINGS: Brain: No evidence of large-territorial acute infarction. No parenchymal hemorrhage. Poorly visualized 2.3 cm right temporal mass with associated vasogenic edema. No extra-axial collection. 4 mm right to left midline shift. Partial effacement of the right frontal and temporal sulci. No hydrocephalus. Basilar cisterns are patent. Vascular: No hyperdense vessel. Skull: No acute fracture or focal lesion. Sinuses/Orbits: Paranasal sinuses and mastoid air cells are clear. The orbits are unremarkable. Other: None. IMPRESSION: Poorly visualized 2.3 cm right temporal mass with associated vasogenic edema as well as 4 mm right to left midline shift. Finding consistent with malignancy. Recommend MRI brain with and without contrast for further evaluation. Electronically Signed   By: Iven Finn M.D.   On: 06/29/2021 22:55   CT CHEST ABDOMEN PELVIS W CONTRAST  Result Date: 06/30/2021 CLINICAL DATA:  Brain metastases, unknown primary. "Pt  c/o fatigue AND feeling "dis-combobulated" since oral surgery on 06/16/2021. Pt reports she fell on Christmas night when she tripped over a box of fruit on the floor but did not hit head. EXAM: CT CHEST, ABDOMEN, AND PELVIS WITH CONTRAST TECHNIQUE: Multidetector CT imaging of the chest, abdomen and pelvis was performed following the standard protocol during bolus administration of intravenous contrast. RADIATION DOSE REDUCTION: This exam was performed according to the departmental dose-optimization program which includes automated exposure control, adjustment of the mA and/or kV according to patient size and/or use of iterative reconstruction technique. CONTRAST:  18mL OMNIPAQUE IOHEXOL 300 MG/ML  SOLN COMPARISON:  None. FINDINGS: CHEST: Ports and Devices: None. Lungs/airways: No focal consolidation. No pulmonary nodule. No pulmonary mass. No pulmonary contusion or laceration. No pneumatocele formation. The central airways are patent. Pleura:  No pleural effusion. No pneumothorax. No hemothorax. Lymph Nodes: No mediastinal, hilar, or axillary lymphadenopathy. Mediastinum: There is a 1.1 x 1 x 1.3 cm hypodensity within the mediastinum inferior to the right inferior pulmonary vein (2:35) no pneumomediastinum. No aortic injury or mediastinal hematoma. The thoracic aorta is normal in caliber. The heart is normal in size. No significant pericardial effusion. The esophagus is unremarkable.  Possible tiny hiatal hernia. The thyroid is unremarkable. The main pulmonary artery is normal in caliber. No central or proximal segmental pulmonary embolus. Chest Wall / Breasts: No chest wall mass. Musculoskeletal: No acute rib or sternal fracture. No spinal fracture. No suspicious lytic or blastic osseous lesion. Densely sclerotic T6 vertebral body lesion likely represents a bone island (6:84). ABDOMEN / PELVIS: Liver: Not enlarged. There is a 1.5 x 1 cm fluid density lesion within the right hepatic lobe (2:51) as well as another  measuring 1.6 x 0 1 cm (2:49). Adjacent associated subcentimeter hypodensity (2:51). Couple of other subcentimeter hypodensities noted throughout the liver (2:35, 42). No laceration or subcapsular hematoma. Biliary System: The gallbladder is otherwise unremarkable with no radio-opaque gallstones. No biliary ductal dilatation. Pancreas: Normal pancreatic contour. No main pancreatic duct dilatation. Spleen: Not enlarged. Single punctate hypodensity too small to characterize. Otherwise no focal lesion. No laceration, subcapsular hematoma, or vascular injury. Adrenal Glands: No nodularity bilaterally. Kidneys: Bilateral kidneys enhance symmetrically. No hydronephrosis. No contusion, laceration, or subcapsular hematoma. Punctate hypodensities too small to characterize. No injury to the vascular structures or collecting systems. No hydroureter. The urinary bladder is unremarkable. Bowel: No small or large bowel wall thickening or dilatation. Scattered colonic diverticulosis. The appendix is normal. Mesentery, Omentum, and Peritoneum: No simple free fluid ascites. No pneumoperitoneum. No hemoperitoneum. No mesenteric hematoma identified. No organized fluid collection. Pelvic Organs: Status post hysterectomy. Bilateral adnexal regions are unremarkable. Likely left ovary visualized (2:96). Right ovary not definitely visualized. Lymph Nodes: No abdominal, pelvic, inguinal lymphadenopathy. Vasculature: Mild atherosclerotic. No abdominal aorta or iliac aneurysm. No active contrast extravasation or pseudoaneurysm. Musculoskeletal: No significant soft tissue hematoma. No acute pelvic fracture. No spinal fracture. No suspicious lytic or blastic osseous lesions. Intervertebral disc space vacuum phenomenon at the L4-L5 level. IMPRESSION: 1. No findings of primary malignancy or metastatic disease. 2. No acute traumatic injury to the chest, abdomen, or pelvis. 3. No acute fracture or traumatic malalignment of the thoracic or lumbar  spine. Other imaging findings of potential clinical significance: 1. Indeterminate 1.1 x 1 x 1.3 cm likely mediastinal hypodensity inferior to the right inferior pulmonary vein. Finding could represent a pericardial cyst or duplication cyst. Recommend attention on follow-up. 2. Several hypodensities within the liver some of which measure fluid density (likely hepatic cysts) and some of which are too small to characterize. 3. Scattered colonic diverticulosis. Electronically Signed   By: Iven Finn M.D.   On: 06/30/2021 00:27    Procedures Procedures    Medications Ordered in ED Medications  dexamethasone (DECADRON) injection 4 mg (4 mg Intravenous Given 06/29/21 2345)  levETIRAcetam (KEPPRA) IVPB 500 mg/100 mL premix (500 mg Intravenous New Bag/Given 06/30/21 0014)  iohexol (OMNIPAQUE) 300 MG/ML solution 100 mL (100 mLs Intravenous Contrast Given 06/30/21 0004)    ED Course/ Medical Decision Making/ A&P Clinical Course as of 06/30/21 0039  Fri Jun 30, 2021  0035 [facility] team consulted - recommending steroids, keppra, medical admission for MRI brain. Patient and family at bedside updated regarding findings of CT and plan for admission, and in agreement. [MT]  Clinical Course User Index [MT] Rahshawn Remo, Carola Rhine, MD                           Medical Decision Making Amount and/or Complexity of Data Reviewed Labs: ordered. Radiology: ordered.  Risk Prescription drug management. Decision regarding hospitalization.   This patient presents to the ED with concern for brain fog, fatigue. This involves an extensive number of treatment options, and is a complaint that carries with it a high risk of complications and morbidity.  The differential diagnosis includes anemia vs viral illness vs CVA vs other  Additional history obtained from family at bedside  I ordered and personally interpreted labs.  The pertinent results include:  largely unremarkable.  Hgb stable - doubt acute bleeding.  BMP at  baseline.  UA without sign of infection.  Covid negative.  I ordered imaging studies including CT head I independently visualized and interpreted imaging which showed concern for temporal mass with small mass effect I agree with the radiologist interpretation  The patient was maintained on a cardiac monitor.  I personally viewed and interpreted the cardiac monitored which showed an underlying rhythm of: NSR  Per my interpretation the patient's ECG shows sinus rhythm  I ordered medication including steroids, keppra for brain mass I have reviewed the patients home medicines and have made adjustments as needed   After the interventions noted above, I reevaluated the patient and found that they have: stayed the same  Dispostion:  After consideration of the diagnostic results and the patients response to treatment, I feel that the patent would benefit from hospitalization.         Final Clinical Impression(s) / ED Diagnoses Final diagnoses:  Brain mass    Rx / DC Orders ED Discharge Orders     None         Vicki Chaffin, Carola Rhine, MD 06/30/21 [phone]

## 2021-06-30 ENCOUNTER — Observation Stay (HOSPITAL_COMMUNITY): Payer: BC Managed Care – PPO

## 2021-06-30 ENCOUNTER — Emergency Department (HOSPITAL_BASED_OUTPATIENT_CLINIC_OR_DEPARTMENT_OTHER): Payer: BC Managed Care – PPO

## 2021-06-30 ENCOUNTER — Encounter (HOSPITAL_BASED_OUTPATIENT_CLINIC_OR_DEPARTMENT_OTHER): Payer: Self-pay | Admitting: Radiology

## 2021-06-30 DIAGNOSIS — G9389 Other specified disorders of brain: Secondary | ICD-10-CM | POA: Diagnosis not present

## 2021-06-30 DIAGNOSIS — E039 Hypothyroidism, unspecified: Secondary | ICD-10-CM | POA: Diagnosis present

## 2021-06-30 DIAGNOSIS — E876 Hypokalemia: Secondary | ICD-10-CM | POA: Diagnosis present

## 2021-06-30 LAB — MAGNESIUM: Magnesium: 2.2 mg/dL (ref 1.7–2.4)

## 2021-06-30 LAB — MRSA NEXT GEN BY PCR, NASAL: MRSA by PCR Next Gen: NOT DETECTED

## 2021-06-30 IMAGING — MR MR HEAD WO/W CM
14 of 15 series · 30 of 48 positions shown · IV contrast (gadavist)
Comparison: CT head [DATE].

CLINICAL DATA: Brain mass.

EXAM:
MRI HEAD WITHOUT AND WITH CONTRAST
TECHNIQUE: Multiplanar, multiecho pulse sequences of the brain and surrounding
structures were obtained without and with intravenous contrast.
CONTRAST:  9.5mL GADAVIST GADOBUTROL 1 MMOL/ML IV SOLN

[Series 2: FLAIR · sagittal · 3.0mm · 0.47mm/px · 1 of 39 slices shown (1 of 2)]
[im 1/39]
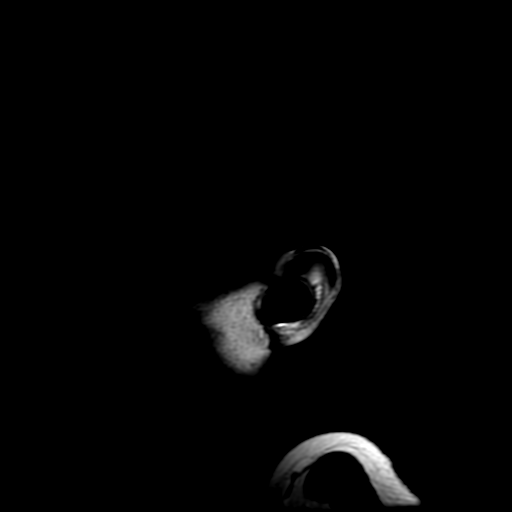

[Series 3: T2 · axial · 5.0mm · 0.23mm/px · 1 of 26 slices shown]
[im 1/26]
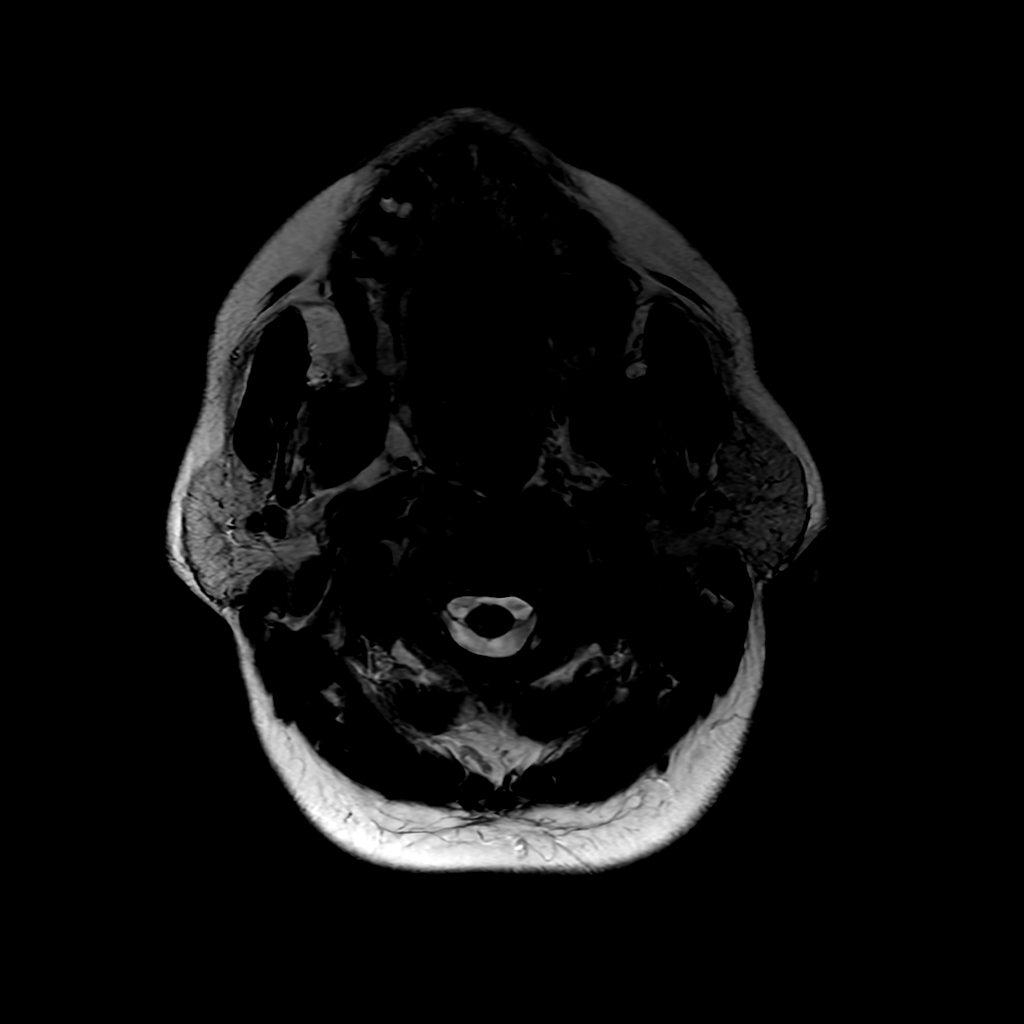

[Series 4: ax dti · axial · 3.0mm · 0.94mm/px · z∈[-15,+105]mm · 8 of 1170 slices shown]
[im 69/1170]
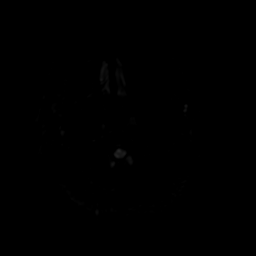
[im 207/1170]
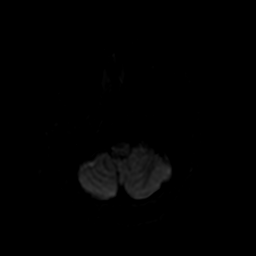
[im 344/1170]
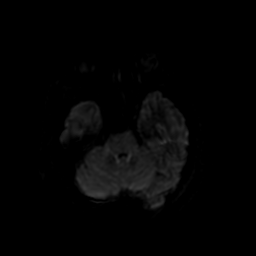
[im 482/1170]
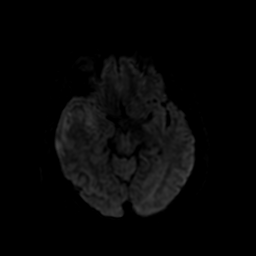
[im 688/1170]
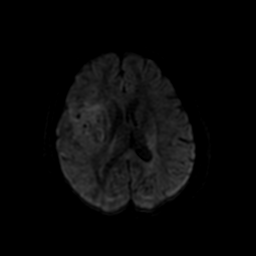
[im 826/1170]
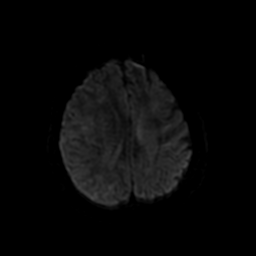
[im 963/1170]
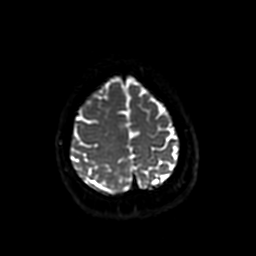
[im 1101/1170]
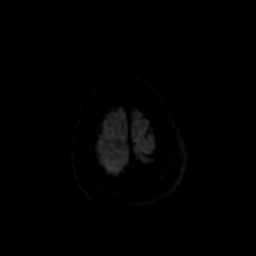

[Series 6: (person_name) · axial · 2.9mm · 0.47mm/px · 1 of 106 slices shown]
[im 1/106]
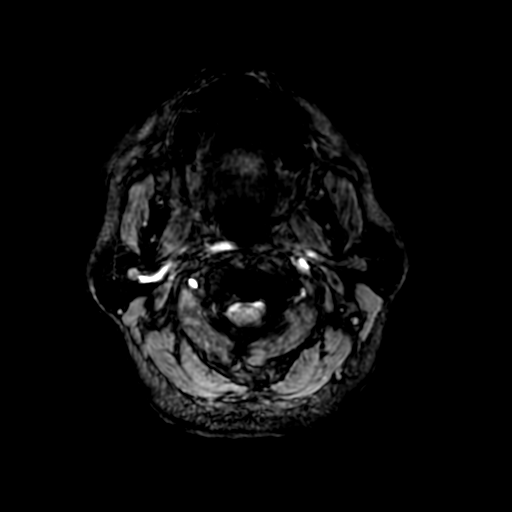

[Series 7: ax 3(person_name) · axial · 1.0mm · 1.02mm/px · z∈[-101,+126]mm · 4 of 228 slices shown (1 of 2)]
[im 1/228]
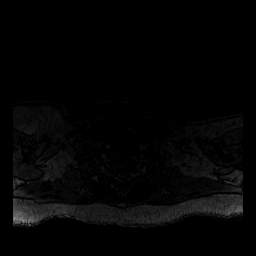
[im 76/228]
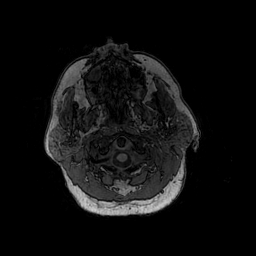
[im 152/228]
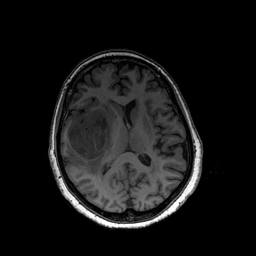
[im 228/228]
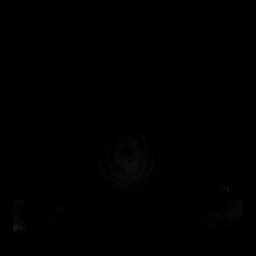

[Series 8: T2 post-contrast · coronal · 3.0mm · 0.39mm/px · 1 of 62 slices shown]
[im 1/62]
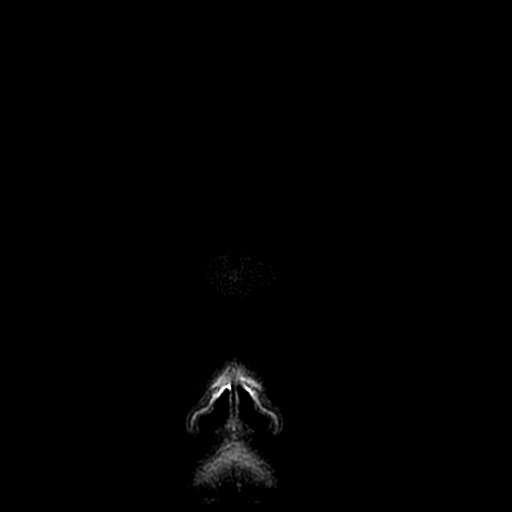

[Series 9: ax 3(person_name) · axial · 1.0mm · 1.02mm/px · z∈[-101,+126]mm · 4 of 228 slices shown (2 of 2)]
[im 1/228]
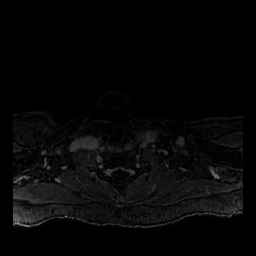
[im 76/228]
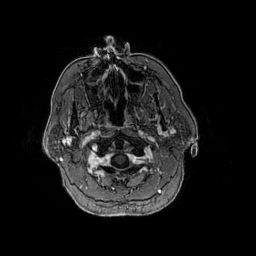
[im 152/228]
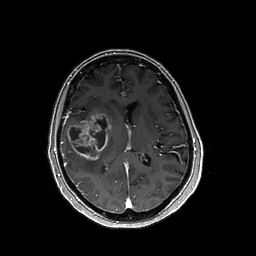
[im 228/228]
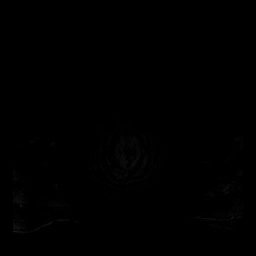

[Series 10: T1 · coronal · 5.0mm · 0.43mm/px · 1 of 31 slices shown]
[im 1/31]
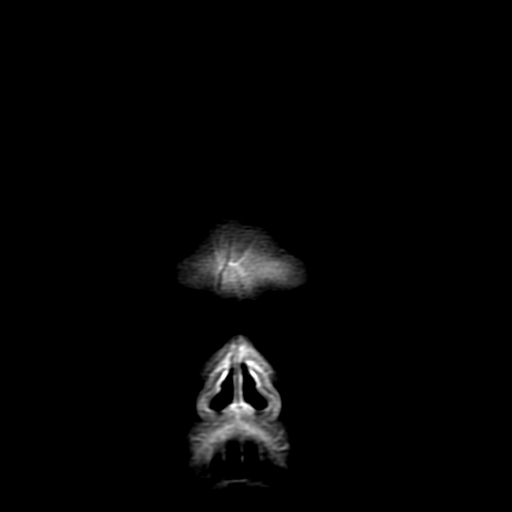

[Series 11: FLAIR · sagittal · 3.0mm · 0.47mm/px · 1 of 39 slices shown (2 of 2)]
[im 1/39]
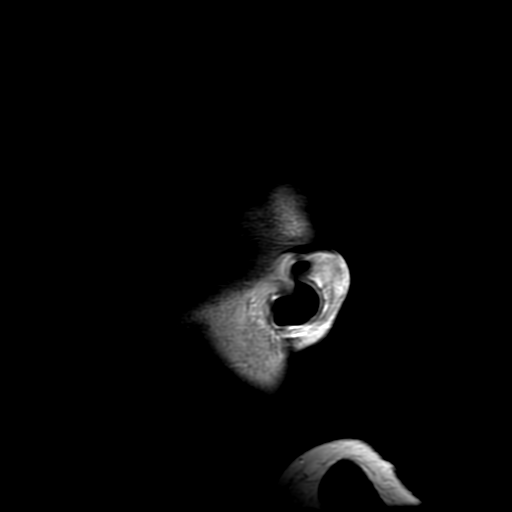

[Series 450: trace · axial · 3.0mm · 0.94mm/px · 1 of 45 slices shown]
[im 1/45]
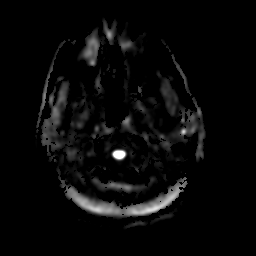

[Series 451: fa(no-q) · axial · 3.0mm · 0.94mm/px · 1 of 45 slices shown]
[im 1/45]
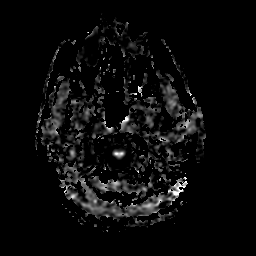

[Series 452: avdc (10^-6 mm²/s)(no-q) · axial · 3.0mm · 0.94mm/px · 1 of 45 slices shown]
[im 1/45]
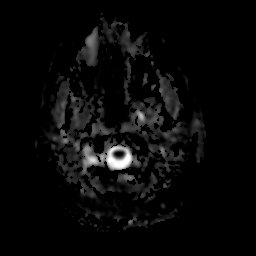

[Series 500: multiplanar reconstruction (mpr) · axial · 1.0mm · 0.50mm/px · z∈[-113,+142]mm · 4 of 256 slices shown (1 of 2)]
[im 1/256]
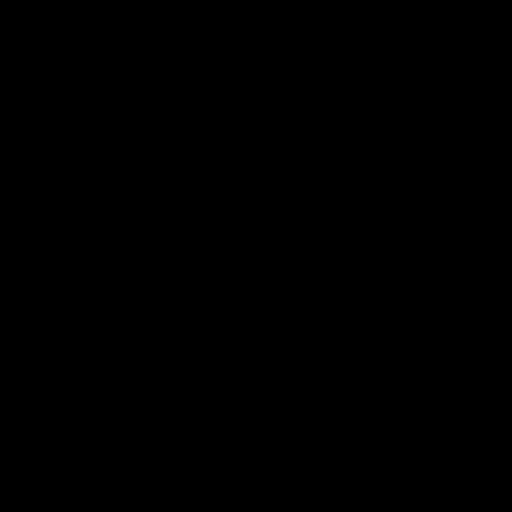
[im 86/256]
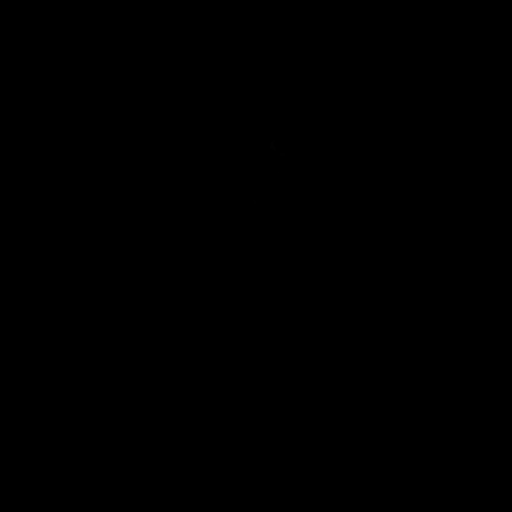
[im 171/256]
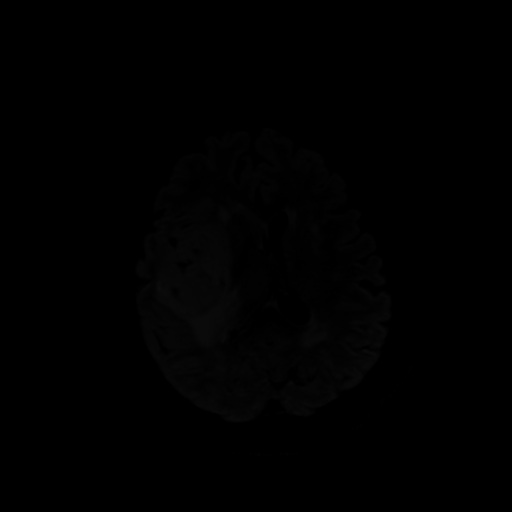
[im 256/256]
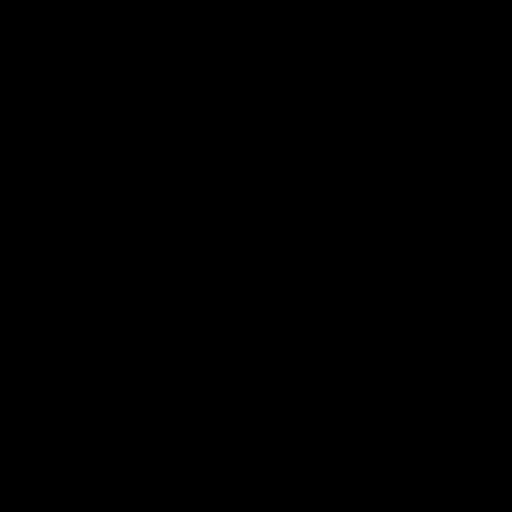

[Series 501: multiplanar reconstruction (mpr) · coronal · 1.0mm · 0.50mm/px · 1 of 243 slices shown (2 of 2)]
[im 1/243]
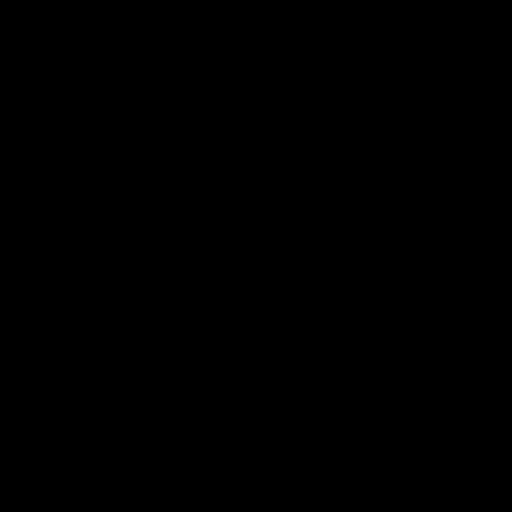

[30 of 48 positions shown; findings below may reference images not displayed]

FINDINGS: Brain: Large mass lesion right temporal lobe. The mass shows
irregular peripheral enhancement and central necrosis. Mild internal
hemorrhage is noted. Surrounding nonenhancing edema is noted. The
mass measures approximately 4.5 x 5.7 x 4.5 cm. There is mass-effect
with 5 mm midline shift to the right. No second lesion identified

Mild patchy white matter hyperintensities bilaterally most likely
chronic ischemia. No acute infarct.

Vascular: Normal arterial flow voids.

Skull and upper cervical spine: No focal skeletal lesion.

Sinuses/Orbits: Mucosal edema and air-fluid level right maxillary
sinus. Negative orbit

Other: None
IMPRESSION: Large enhancing mass lesion in the right temporal lobe. Central
necrosis and mild internal hemorrhage. Features are typical of
glioblastoma. There is mass-effect with edema and 5 mm midline
shift.

## 2021-06-30 IMAGING — CT CT CHEST-ABD-PELV W/ CM
2 of 5 series · 13 of 46 positions shown, 15 images · IV contrast (APPLIED)
Comparison: None.

CLINICAL DATA: Brain metastases, unknown primary. "Pt c/o fatigue
AND feeling "dis-combobulated" since oral surgery on [DATE]. Pt
reports she fell on [REDACTED] night when she tripped over a box of
fruit on the floor but did not hit head.

EXAM:
CT CHEST, ABDOMEN, AND PELVIS WITH CONTRAST
TECHNIQUE: Multidetector CT imaging of the chest, abdomen and pelvis was
performed following the standard protocol during bolus
administration of intravenous contrast.

[Series 2: cap with · axial · 0.98mm/px · z∈[+168,+723]mm · 10 of 133 slices shown, 12 images]
[im 11/133  soft-tissue]
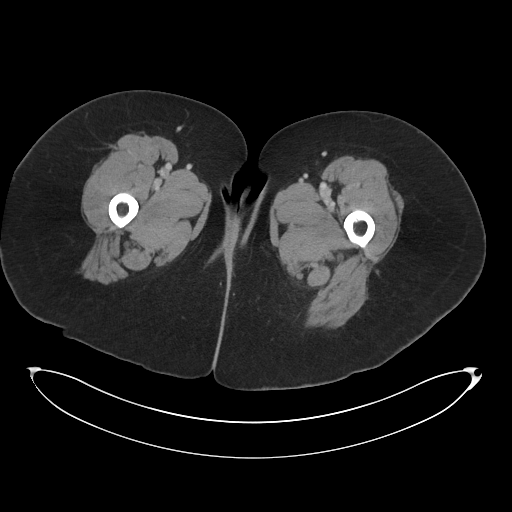
[im 11/133  bone]
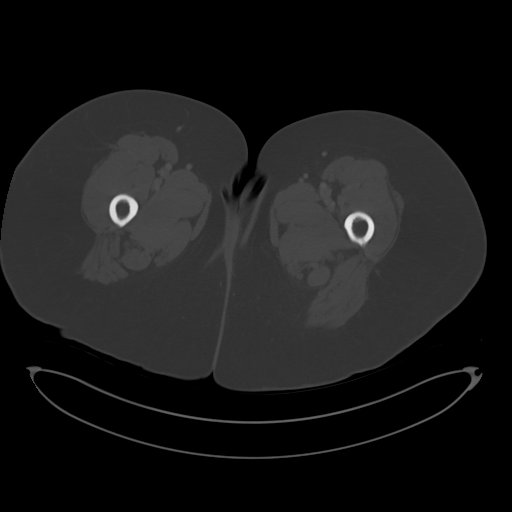
[im 21/133  soft-tissue]
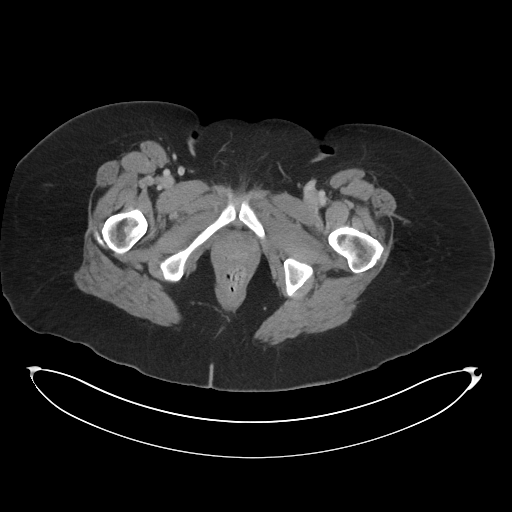
[im 41/133  soft-tissue]
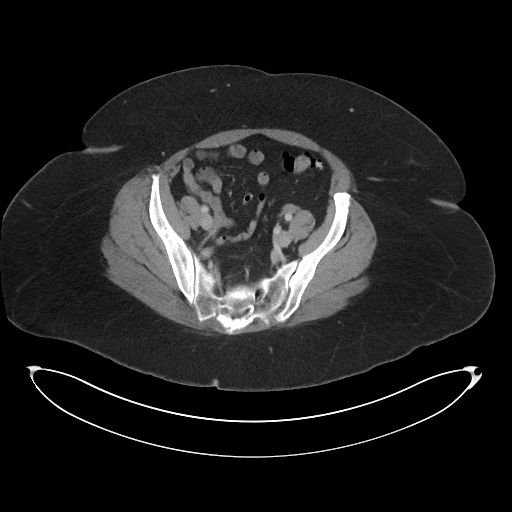
[im 51/133  soft-tissue]
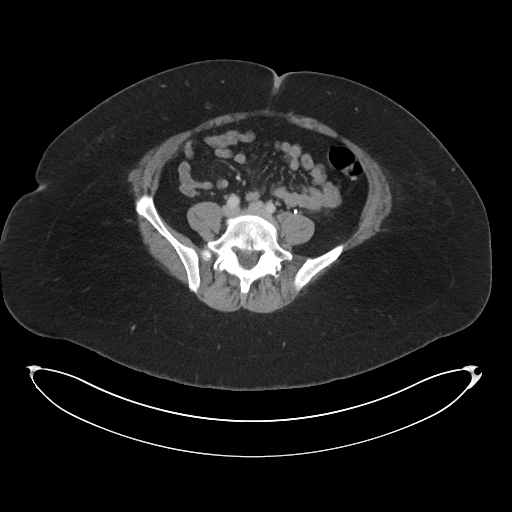
[im 61/133  soft-tissue]
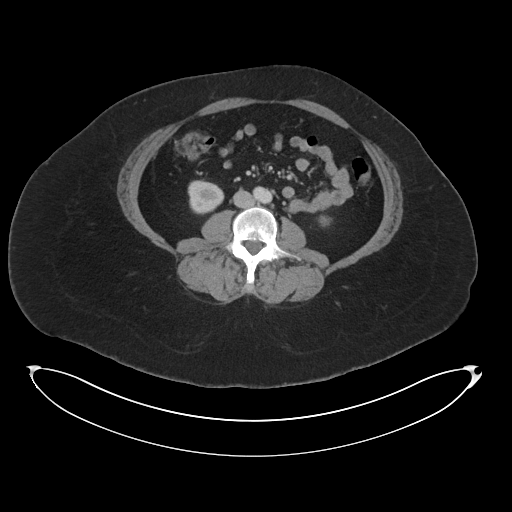
[im 72/133  soft-tissue]
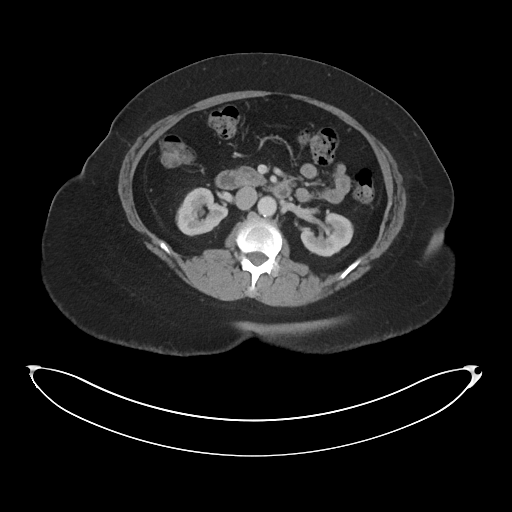
[im 82/133  soft-tissue]
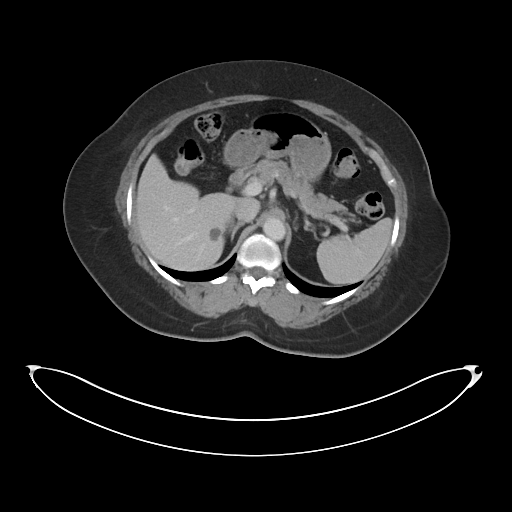
[im 102/133  soft-tissue]
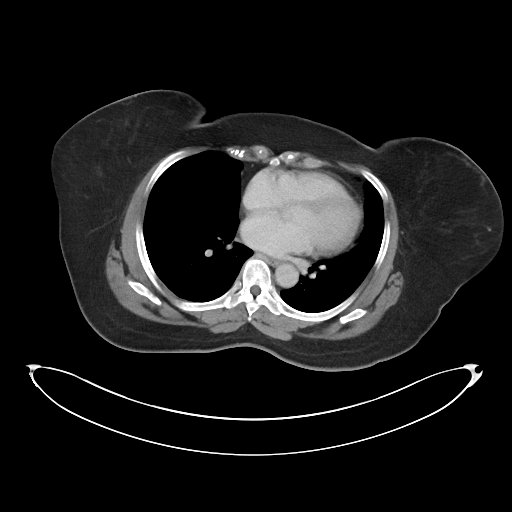
[im 112/133  soft-tissue]
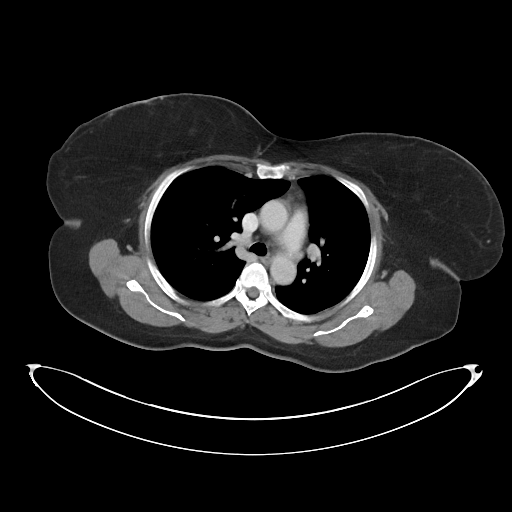
[im 112/133  bone]
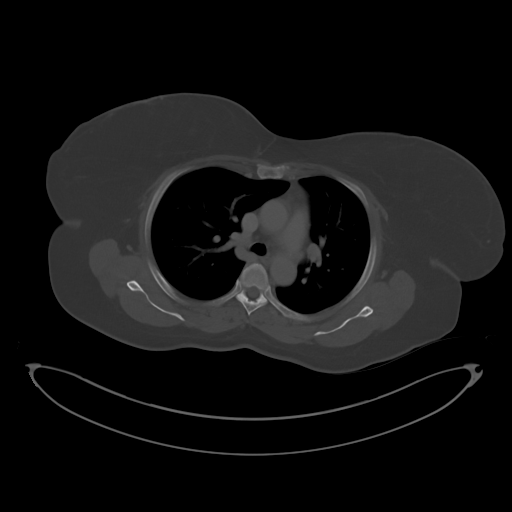
[im 122/133  soft-tissue]
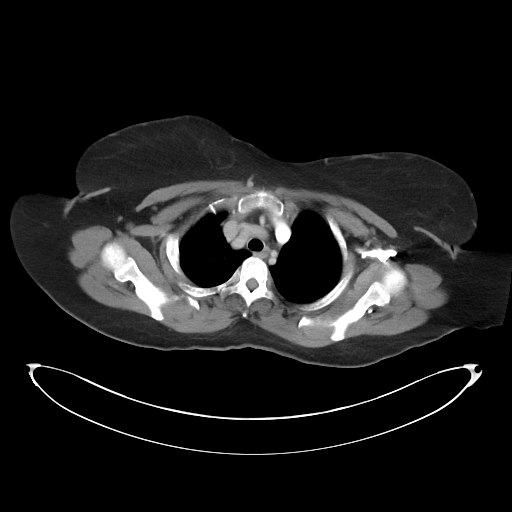

[Series 5: coronal · coronal · 0.98mm/px · 3 of 112 slices shown]
[im 38/112  soft-tissue]
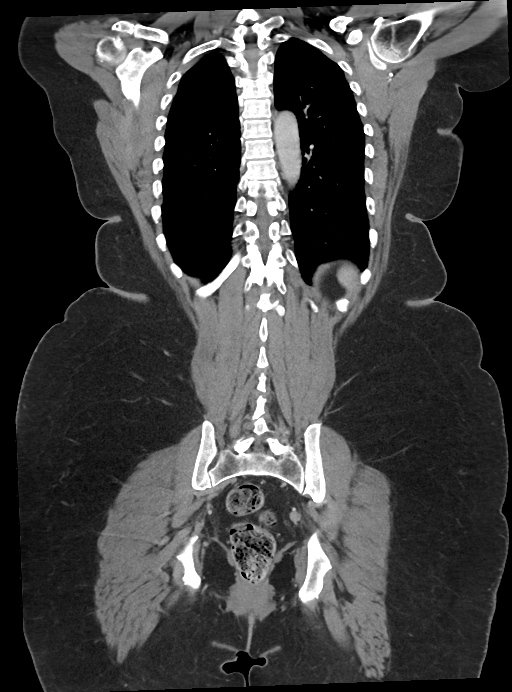
[im 50/112  soft-tissue]
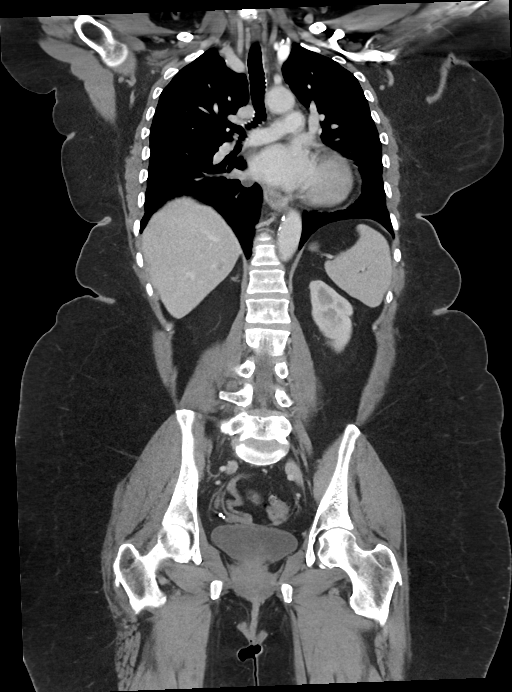
[im 62/112  soft-tissue]
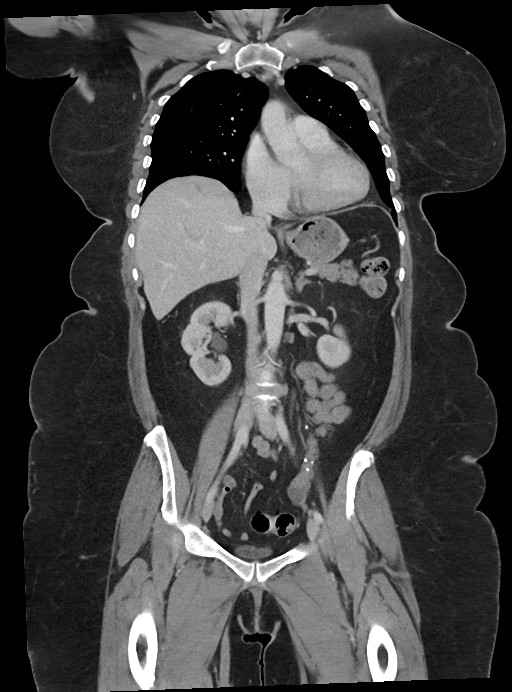

[13 of 46 positions shown; findings below may reference images not displayed]

RADIATION DOSE REDUCTION: This exam was performed according to the
departmental dose-optimization program which includes automated
exposure control, adjustment of the mA and/or kV according to
patient size and/or use of iterative reconstruction technique.

CONTRAST:  100mL OMNIPAQUE IOHEXOL 300 MG/ML  SOLN
FINDINGS: CHEST:
Ports and Devices: None.

Lungs/airways:

No focal consolidation. No pulmonary nodule. No pulmonary mass. No
pulmonary contusion or laceration. No pneumatocele formation.

The central airways are patent.

Pleura: No pleural effusion. No pneumothorax. No hemothorax.

Lymph Nodes: No mediastinal, hilar, or axillary lymphadenopathy.

Mediastinum:

There is a 1.1 x 1 x 1.3 cm hypodensity within the mediastinum
inferior to the right inferior pulmonary vein ([DATE]) no
pneumomediastinum. No aortic injury or mediastinal hematoma.

The thoracic aorta is normal in caliber. The heart is normal in
size. No significant pericardial effusion.

The esophagus is unremarkable.  Possible tiny hiatal hernia.

The thyroid is unremarkable.
The main pulmonary artery is normal in caliber. No central or
proximal segmental pulmonary embolus.

Chest Wall / Breasts: No chest wall mass.

Musculoskeletal: No acute rib or sternal fracture. No spinal
fracture. No suspicious lytic or blastic osseous lesion. Densely
sclerotic T6 vertebral body lesion likely represents a bone island
([DATE]).

ABDOMEN / PELVIS:
Liver: Not enlarged. There is a 1.5 x 1 cm fluid density lesion
within the right hepatic lobe ([DATE]) as well as another measuring
1.6 x 0 1 cm ([DATE]). Adjacent associated subcentimeter hypodensity
([DATE]). Couple of other subcentimeter hypodensities noted throughout
the liver ([DATE], 42). No laceration or subcapsular hematoma.

Biliary System: The gallbladder is otherwise unremarkable with no
radio-opaque gallstones. No biliary ductal dilatation.

Pancreas: Normal pancreatic contour. No main pancreatic duct
dilatation.

Spleen: Not enlarged. Single punctate hypodensity too small to
characterize. Otherwise no focal lesion. No laceration, subcapsular
hematoma, or vascular injury.

Adrenal Glands: No nodularity bilaterally.

Kidneys:

Bilateral kidneys enhance symmetrically. No hydronephrosis. No
contusion, laceration, or subcapsular hematoma. Punctate
hypodensities too small to characterize.

No injury to the vascular structures or collecting systems. No
hydroureter.

The urinary bladder is unremarkable.

Bowel: No small or large bowel wall thickening or dilatation.
Scattered colonic diverticulosis. The appendix is normal.

Mesentery, Omentum, and Peritoneum: No simple free fluid ascites. No
pneumoperitoneum. No hemoperitoneum. No mesenteric hematoma
identified. No organized fluid collection.

Pelvic Organs: Status post hysterectomy. Bilateral adnexal regions
are unremarkable. Likely left ovary visualized ([DATE]). Right ovary
not definitely visualized.

Lymph Nodes: No abdominal, pelvic, inguinal lymphadenopathy.

Vasculature: Mild atherosclerotic. No abdominal aorta or iliac
aneurysm. No active contrast extravasation or pseudoaneurysm.

Musculoskeletal:

No significant soft tissue hematoma.

No acute pelvic fracture. No spinal fracture. No suspicious lytic or
blastic osseous lesions. Intervertebral disc space vacuum phenomenon
at the L4-L5 level.
IMPRESSION: 1. No findings of primary malignancy or metastatic disease.
2. No acute traumatic injury to the chest, abdomen, or pelvis.

3. No acute fracture or traumatic malalignment of the thoracic or
lumbar spine.

Other imaging findings of potential clinical significance:

1. Indeterminate 1.1 x 1 x 1.3 cm likely mediastinal hypodensity
inferior to the right inferior pulmonary vein. Finding could
represent a pericardial cyst or duplication cyst. Recommend
attention on follow-up.
2. Several hypodensities within the liver some of which measure
fluid density (likely hepatic cysts) and some of which are too small
to characterize.
3. Scattered colonic diverticulosis.

## 2021-06-30 MED ORDER — LEVETIRACETAM 500 MG PO TABS
500.0000 mg | ORAL_TABLET | Freq: Two times a day (BID) | ORAL | Status: DC
  Administered 2021-06-30 – 2021-07-12 (×23): 500 mg via ORAL
  Filled 2021-06-30 (×23): qty 1

## 2021-06-30 MED ORDER — FAMOTIDINE 20 MG PO TABS
20.0000 mg | ORAL_TABLET | Freq: Every day | ORAL | Status: DC
  Administered 2021-06-30 – 2021-07-12 (×12): 20 mg via ORAL
  Filled 2021-06-30 (×12): qty 1

## 2021-06-30 MED ORDER — SODIUM CHLORIDE 0.9% FLUSH
3.0000 mL | INTRAVENOUS | Status: DC | PRN
  Administered 2021-07-07: 10:00:00 3 mL via INTRAVENOUS

## 2021-06-30 MED ORDER — DEXAMETHASONE 4 MG PO TABS
4.0000 mg | ORAL_TABLET | Freq: Four times a day (QID) | ORAL | Status: DC
  Administered 2021-06-30 – 2021-07-09 (×33): 4 mg via ORAL
  Filled 2021-06-30 (×33): qty 1

## 2021-06-30 MED ORDER — SODIUM CHLORIDE 0.9 % IV SOLN: 250.0000 mL | INTRAVENOUS | Status: DC | PRN

## 2021-06-30 MED ORDER — ALPRAZOLAM 0.25 MG PO TABS
0.2500 mg | ORAL_TABLET | Freq: Two times a day (BID) | ORAL | Status: DC | PRN
  Administered 2021-06-30 – 2021-07-10 (×7): 0.25 mg via ORAL
  Filled 2021-06-30 (×7): qty 1

## 2021-06-30 MED ORDER — ADULT MULTIVITAMIN W/MINERALS CH
1.0000 | ORAL_TABLET | Freq: Every day | ORAL | Status: DC
  Administered 2021-07-01 – 2021-07-12 (×11): 1 via ORAL
  Filled 2021-06-30 (×11): qty 1

## 2021-06-30 MED ORDER — ACETAMINOPHEN 650 MG RE SUPP: 650.0000 mg | Freq: Four times a day (QID) | RECTAL | Status: DC | PRN

## 2021-06-30 MED ORDER — ACETAMINOPHEN 325 MG PO TABS
650.0000 mg | ORAL_TABLET | Freq: Four times a day (QID) | ORAL | Status: DC | PRN
  Administered 2021-07-08: 650 mg via ORAL
  Filled 2021-06-30: qty 2

## 2021-06-30 MED ORDER — HEPARIN SODIUM (PORCINE) 5000 UNIT/ML IJ SOLN
5000.0000 [IU] | Freq: Three times a day (TID) | INTRAMUSCULAR | Status: AC
  Administered 2021-06-30 – 2021-07-05 (×16): 5000 [IU] via SUBCUTANEOUS
  Filled 2021-06-30 (×15): qty 1

## 2021-06-30 MED ORDER — SODIUM CHLORIDE 0.9% FLUSH
3.0000 mL | Freq: Two times a day (BID) | INTRAVENOUS | Status: DC
  Administered 2021-06-30 – 2021-07-12 (×19): 3 mL via INTRAVENOUS

## 2021-06-30 MED ORDER — IOHEXOL 300 MG/ML  SOLN
100.0000 mL | Freq: Once | INTRAMUSCULAR | Status: AC | PRN
  Administered 2021-06-30: 100 mL via INTRAVENOUS

## 2021-06-30 MED ORDER — LEVOTHYROXINE SODIUM 50 MCG PO TABS
50.0000 ug | ORAL_TABLET | Freq: Every day | ORAL | Status: DC
  Administered 2021-07-01 – 2021-07-12 (×12): 50 ug via ORAL
  Filled 2021-06-30 (×12): qty 1

## 2021-06-30 MED ORDER — GADOBUTROL 1 MMOL/ML IV SOLN
9.5000 mL | Freq: Once | INTRAVENOUS | Status: AC | PRN
  Administered 2021-06-30: 9.5 mL via INTRAVENOUS

## 2021-06-30 NOTE — Assessment & Plan Note (Addendum)
TSH wnl 12/22 Continue synthroid

## 2021-06-30 NOTE — Progress Notes (Signed)
This RN has tried to call for report twice @ 1225 and was unable to reach anyone. Number called was [phone]  Shelbie Proctor, RN

## 2021-06-30 NOTE — ED Notes (Signed)
Handoff report given to [facility] RN on 4NP at [facility];  [facility] at bedside

## 2021-06-30 NOTE — Plan of Care (Signed)
°  Problem: Education: Goal: Knowledge of General Education information will improve Description: Including pain rating scale, medication(s)/side effects and non-pharmacologic comfort measures 06/30/2021 1443 by Shelbie Proctor, RN Outcome: Progressing 06/30/2021 1443 by Shelbie Proctor, RN Outcome: Progressing  Problem: Clinical Measurements: Goal: Ability to maintain clinical measurements within normal limits will improve 06/30/2021 1443 by Shelbie Proctor, RN Outcome: Progressing 06/30/2021 1443 by Shelbie Proctor, RN Outcome: Progressing Goal: Will remain free from infection 06/30/2021 1443 by Shelbie Proctor, RN Outcome: Progressing 06/30/2021 1443 by Shelbie Proctor, RN Outcome: Progressing Goal: Diagnostic test results will improve 06/30/2021 1443 by Shelbie Proctor, RN Outcome: Progressing 06/30/2021 1443 by Shelbie Proctor, RN Outcome: Progressing Goal: Respiratory complications will improve 06/30/2021 1443 by Shelbie Proctor, RN Outcome: Progressing 06/30/2021 1443 by Shelbie Proctor, RN Outcome: Progressing Goal: Cardiovascular complication will be avoided 06/30/2021 1443 by Shelbie Proctor, RN Outcome: Progressing 06/30/2021 1443 by Shelbie Proctor, RN Outcome: Progressing

## 2021-06-30 NOTE — Consult Note (Signed)
° °  Providing Compassionate, Quality Care - Together  Neurosurgery Consult  Referring physician: Dr. Rogers Blocker Reason for referral: Brain tumor  Chief Complaint: Not feeling herself, memory changes  History of Present Illness: This is a pleasant right-handed 71 year old female with a past medical history of hyperlipidemia, hypertension, hypothyroidism, that complains of not feeling herself and feeling discombobulated for 2 weeks.  Due to this, she was sent to the emergency department for CT scan of her head.  She also complains of intermittent driving difficulties over the past week where she was making inappropriate turns.  She denies any nausea vomiting weakness numbness or tingling.  Denies any seizure activity, denies any difficulty with her speech.   Medications: I have reviewed the patient's current medications. Allergies: No Known Allergies  History reviewed. No pertinent family history. Social History:  has no history on file for tobacco use, alcohol use, and drug use.  ROS: All positives and negatives are listed HPI above  Physical Exam:  Vital signs in last 24 hours: Temp:  [98 F (36.7 C)-98.3 F (36.8 C)] 98 F (36.7 C) (07/25 1814) Pulse Rate:  [58-128] 65 (07/26 0746) Resp:  [11-18] 14 (07/26 0217) BP: (138-182)/(65-125) 153/88 (07/26 0700) SpO2:  [91 %-98 %] 96 % (07/26 0746) PE: Awake alert oriented x3 No acute distress Vital signs stable, PERRLA Cranial nerves II through XII intact RUE 5/5, LUE 4+/5 RLE 5/5, L LE 4+/5 Left pronator drift positive Sensory intact light touch No dysmetria  Impression/Assessment:  71 year old female with  Large right temporal tumor, 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  I discussed all risks, benefits and expected outcomes with the patient and her family at bedside.  I answered all their questions. -I  spent an extensive time with the patient and her family, showing them the images on the MRI and the likelihood that this is a high-grade glioma.  We discussed treatment including chemotherapy and radiation after surgery.  We discussed surgical risks including heart attack, stroke, death, infection, bleeding, need for more surgery, they verbalized her understanding. -Unfortunately due to the size and location of this tumor I do not believe it can be completely resected due to its adjacent localization to the insula as well as its involvement of the middle cerebral artery.  I did discuss with the patient that I believe a significant majority of the tumor can be resected for cytoreduction.   Thank you for allowing me to participate in this patient's care.  Please do not hesitate to call with questions or concerns.   Elwin Sleight, [facility] Cell: [phone]

## 2021-06-30 NOTE — ED Notes (Signed)
Attempt to call report.  No answer °

## 2021-06-30 NOTE — Plan of Care (Signed)
[facility] will assume care on arrival to accepting facility. Until arrival, care as per EDP. However, [facility] available 24/7 for questions and assistance. ° °Nursing staff, please page [facility] Admits and Consults ([phone]) as soon as the patient arrives the hospital.   °

## 2021-06-30 NOTE — Assessment & Plan Note (Addendum)
Baseline appears to be 11-12 Continue to monitor

## 2021-06-30 NOTE — Assessment & Plan Note (Addendum)
71 year old with 1.5 week history of instability, memory loss found to have brain mass  Concern for primary [facility] Neurosurgery consulted and following. Appreciate.  MRI brain ordered AED/steroids per neurosurgery  Hold any VTE ppx in case biopsy needed  Fall precautions

## 2021-06-30 NOTE — Assessment & Plan Note (Signed)
Check mag Replete x 65meq Repeat in AM
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
## 2021-06-30 NOTE — ED Notes (Signed)
Handoff report given carelink

## 2021-06-30 NOTE — Assessment & Plan Note (Addendum)
Blood pressure has been soft. Has not had her daily hctz, hold this for now and monitor

## 2021-07-01 LAB — BASIC METABOLIC PANEL
Anion gap: 8 (ref 5–15)
BUN: 15 mg/dL (ref 8–23)
CO2: 25 mmol/L (ref 22–32)
Calcium: 8.7 mg/dL — ABNORMAL LOW (ref 8.9–10.3)
Chloride: 107 mmol/L (ref 98–111)
Creatinine, Ser: 0.98 mg/dL (ref 0.44–1.00)
GFR, Estimated: >60 mL/min (ref >60)
Glucose, Bld: 147 mg/dL — ABNORMAL HIGH (ref 70–99)
Potassium: 4.1 mmol/L (ref 3.5–5.1)
Sodium: 140 mmol/L (ref 135–145)

## 2021-07-01 LAB — CBC
HCT: 33.1 % — ABNORMAL LOW (ref 36.0–46.0)
Hemoglobin: 10.6 g/dL — ABNORMAL LOW (ref 12.0–15.0)
MCH: 29.6 pg (ref 26.0–34.0)
MCHC: 32 g/dL (ref 30.0–36.0)
MCV: 92.5 fL (ref 80.0–100.0)
Platelets: 315 10*3/uL (ref 150–400)
RBC: 3.58 MIL/uL — ABNORMAL LOW (ref 3.87–5.11)
RDW: 13.5 % (ref 11.5–15.5)
WBC: 6.7 10*3/uL (ref 4.0–10.5)
nRBC: 0 % (ref 0.0–0.2)

## 2021-07-01 LAB — HIV ANTIBODY (ROUTINE TESTING W REFLEX): HIV Screen 4th Generation wRfx: NONREACTIVE

## 2021-07-01 NOTE — Plan of Care (Signed)
Dr Dawley with [facility] has spoken with me today and has taken over as attending. TRH will sign off.

## 2021-07-01 NOTE — Progress Notes (Signed)
° °  Providing Compassionate, Quality Care - Together  NEUROSURGERY PROGRESS NOTE   S: No issues overnight.   O: EXAM:  BP 95/77 (BP Location: Right Arm)    Pulse 84    Temp 98.4 F (36.9 C) (Oral)    Resp 19    Ht 5\' 2"  (1.575 m)    Wt 95.3 kg    SpO2 97%    BMI 38.41 kg/m   Awake, alert, oriented x3 PERRL Speech fluent, appropriate  CNs grossly intact  MAE, slight L sided weakness + drift, left  ASSESSMENT:  71 y.o. 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  Please do not hesitate to call with questions or concerns.   Elwin Sleight, [facility] Cell: [phone]

## 2021-07-02 DIAGNOSIS — R5383 Other fatigue: Secondary | ICD-10-CM | POA: Diagnosis present

## 2021-07-02 DIAGNOSIS — R4781 Slurred speech: Secondary | ICD-10-CM | POA: Diagnosis not present

## 2021-07-02 DIAGNOSIS — R7989 Other specified abnormal findings of blood chemistry: Secondary | ICD-10-CM | POA: Diagnosis not present

## 2021-07-02 DIAGNOSIS — E039 Hypothyroidism, unspecified: Secondary | ICD-10-CM | POA: Diagnosis present

## 2021-07-02 DIAGNOSIS — D496 Neoplasm of unspecified behavior of brain: Secondary | ICD-10-CM | POA: Diagnosis not present

## 2021-07-02 DIAGNOSIS — Z880 Allergy status to penicillin: Secondary | ICD-10-CM | POA: Diagnosis not present

## 2021-07-02 DIAGNOSIS — R4189 Other symptoms and signs involving cognitive functions and awareness: Secondary | ICD-10-CM | POA: Diagnosis present

## 2021-07-02 DIAGNOSIS — E876 Hypokalemia: Secondary | ICD-10-CM | POA: Diagnosis present

## 2021-07-02 DIAGNOSIS — D72829 Elevated white blood cell count, unspecified: Secondary | ICD-10-CM | POA: Diagnosis present

## 2021-07-02 DIAGNOSIS — E785 Hyperlipidemia, unspecified: Secondary | ICD-10-CM | POA: Diagnosis present

## 2021-07-02 DIAGNOSIS — G8194 Hemiplegia, unspecified affecting left nondominant side: Secondary | ICD-10-CM | POA: Diagnosis present

## 2021-07-02 DIAGNOSIS — Z806 Family history of leukemia: Secondary | ICD-10-CM | POA: Diagnosis not present

## 2021-07-02 DIAGNOSIS — D649 Anemia, unspecified: Secondary | ICD-10-CM | POA: Diagnosis present

## 2021-07-02 DIAGNOSIS — R2689 Other abnormalities of gait and mobility: Secondary | ICD-10-CM | POA: Diagnosis present

## 2021-07-02 DIAGNOSIS — C710 Malignant neoplasm of cerebrum, except lobes and ventricles: Secondary | ICD-10-CM | POA: Diagnosis present

## 2021-07-02 DIAGNOSIS — Z833 Family history of diabetes mellitus: Secondary | ICD-10-CM | POA: Diagnosis not present

## 2021-07-02 DIAGNOSIS — G936 Cerebral edema: Secondary | ICD-10-CM | POA: Diagnosis present

## 2021-07-02 DIAGNOSIS — Z888 Allergy status to other drugs, medicaments and biological substances status: Secondary | ICD-10-CM | POA: Diagnosis not present

## 2021-07-02 DIAGNOSIS — R29810 Facial weakness: Secondary | ICD-10-CM | POA: Diagnosis not present

## 2021-07-02 DIAGNOSIS — Z8616 Personal history of COVID-19: Secondary | ICD-10-CM | POA: Diagnosis not present

## 2021-07-02 DIAGNOSIS — Z8249 Family history of ischemic heart disease and other diseases of the circulatory system: Secondary | ICD-10-CM | POA: Diagnosis not present

## 2021-07-02 DIAGNOSIS — N179 Acute kidney failure, unspecified: Secondary | ICD-10-CM | POA: Diagnosis present

## 2021-07-02 DIAGNOSIS — Z79899 Other long term (current) drug therapy: Secondary | ICD-10-CM | POA: Diagnosis not present

## 2021-07-02 DIAGNOSIS — Z20822 Contact with and (suspected) exposure to covid-19: Secondary | ICD-10-CM | POA: Diagnosis present

## 2021-07-02 DIAGNOSIS — Z823 Family history of stroke: Secondary | ICD-10-CM | POA: Diagnosis not present

## 2021-07-02 DIAGNOSIS — C712 Malignant neoplasm of temporal lobe: Secondary | ICD-10-CM | POA: Diagnosis present

## 2021-07-02 DIAGNOSIS — Z79890 Hormone replacement therapy (postmenopausal): Secondary | ICD-10-CM | POA: Diagnosis not present

## 2021-07-02 DIAGNOSIS — I10 Essential (primary) hypertension: Secondary | ICD-10-CM | POA: Diagnosis present

## 2021-07-02 DIAGNOSIS — R600 Localized edema: Secondary | ICD-10-CM | POA: Diagnosis present

## 2021-07-02 NOTE — TOC Progression Note (Signed)
Transition of Care [facility]) - Progression Note    Patient Details  Name: Denise Wright MRN: [id number] Date of Birth: 1950/09/01  Transition of [facility] CM/SW Contact  Zenon Mayo, RN Phone Number: 07/02/2021, 1:27 PM  Clinical Narrative:     Transition of Care [facility]) Screening Note   Patient Details  Name: Denise Wright Date of Birth: 13-Sep-1950   Transition of Care [facility]) CM/SW Contact:    Zenon Mayo, RN Phone Number: 07/02/2021, 1:28 PM    Transition of Care Department [facility]) has reviewed patient and no TOC needs have been identified at this time. We will continue to monitor patient advancement through interdisciplinary progression rounds. If new patient transition needs arise, please place a TOC consult.          Expected Discharge Plan and Services                                                 Social Determinants of Health (SDOH) Interventions    Readmission Risk Interventions No flowsheet data found.

## 2021-07-02 NOTE — Progress Notes (Signed)
Patient ID: Denise Wright, female   DOB: [birth year], 71 y.o.   MRN: [id number] BP 125/75 (BP Location: Right Arm)    Pulse 60    Temp 98.1 F (36.7 C) (Oral)    Resp 16    Ht 5\' 2"  (1.575 m)    Wt 95.3 kg    SpO2 96%    BMI 38.41 kg/m  Alert and oriented x 4, speech is clear and fluent Moving all extremities, left drift Await brain tumor conference discussion

## 2021-07-03 ENCOUNTER — Other Ambulatory Visit: Payer: Self-pay | Admitting: Neurological Surgery

## 2021-07-03 NOTE — Progress Notes (Signed)
Subjective: Patient reports that she is doing well overall. She has an occasional mild headache. She has no other complaints. No acute events overnight.   Objective: Vital signs in last 24 hours: Temp:  [97.7 F (36.5 C)-98.5 F (36.9 C)] 97.7 F (36.5 C) (01/23 0715) Pulse Rate:  [61-68] 61 (01/23 0400) Resp:  [19-33] 20 (01/22 2300) BP: (110-155)/(64-84) 155/84 (01/23 0715) SpO2:  [94 %-98 %] 97 % (01/22 2259)  Intake/Output from previous day: 01/22 0701 - 01/23 0700 In: 363 [P.O.:360; I.V.:3] Out: -  Intake/Output this shift: No intake/output data recorded.  Physical Exam: Patient is awake, A/O X 4, conversant, and in good spirits. They are in NAD and VSS. Doing well. Speech is fluent and appropriate. MAEW. RUR and RLE 5/5. LUE and LLE 4+/5, LUE drift present. Sensation to light touch is intact. PERLA, EOMI. CNs grossly intact.      Lab Results: Recent Labs    07/01/21 0226  WBC 6.7  HGB 10.6*  HCT 33.1*  PLT 315   BMET Recent Labs    07/01/21 0226  NA 140  K 4.1  CL 107  CO2 25  GLUCOSE 147*  BUN 15  CREATININE 0.98  CALCIUM 8.7*    Studies/Results: No results found.  Assessment/Plan: 71 y.o. female with a large right temporal tumor. The patient's neurological exam is stable. Plan for OR on Thursday for tumor resection.   -Keppra 500 twice daily -Subcu heparin -Decadron 4Q6h -Xanax PRN for anxiety -Tumor resection Thursday  LOS: 1 day     Marvis Moeller, DNP, NP-C 07/03/2021, 8:48 AM

## 2021-07-03 NOTE — Progress Notes (Signed)
°   07/03/21 1535  Clinical Encounter Type  Visited With Patient  Visit Type Initial  Referral From Nurse  Consult/Referral To None   Chaplain visited with patient at the request of nurse. Patient shared that she was doing well but was facing surgery on Thursday and hoped to gather with family Wednesday for prayer.   [place] Resident  [facility] [phone]

## 2021-07-03 NOTE — Progress Notes (Signed)
Mobility Specialist Progress Note   07/03/21 1445  Mobility  Activity Ambulated independently in hallway  Level of Assistance Independent after set-up  Assistive Device None  Distance Ambulated (ft) 560 ft  Activity Response Tolerated well  $Mobility charge 1 Mobility   Received pt in bed having no complaints and agreeable to mobility. Asymptomatic throughout ambulation, returned back to bed w/ call bell in reach and all needs met.  Pre Mobility: 72 HR, 123/75 BP, 99% SpO2  [place] Mobility Specialist Phone Number [phone]

## 2021-07-04 NOTE — Progress Notes (Signed)
Mobility Specialist Progress Note   07/04/21 1600  Mobility  Activity Ambulated with assistance in hallway  Level of Assistance Standby assist, set-up cues, supervision of patient - no hands on  Assistive Device None  Distance Ambulated (ft) 650 ft  Activity Response Tolerated well  $Mobility charge 1 Mobility   Received pt in bed having no complaints and agreeable to mobility. Requiring min cues for spatial awareness while ambulating but Asymptomatic throughout. Returned back to EOB w/ call bell in reach and all needs met.  [place] F[place] Mobility Specialist Phone Number [phone]

## 2021-07-04 NOTE — Progress Notes (Signed)
°   07/04/21 1500  Clinical Encounter Type  Visited With Patient  Visit Type Follow-up  Referral From Nurse  Consult/Referral To None   The Chaplain was following up with patient about a family gathering for prayer before her surgery.  I inquired on her behalf. The request was declined which I informed the patient that the recommendation was to take advantage of an online platform to gather. The hospital policy is 2 visitors at a time per patient.   [place] Resident  [facility] [phone]

## 2021-07-05 ENCOUNTER — Encounter (HOSPITAL_COMMUNITY): Payer: Self-pay | Admitting: Internal Medicine

## 2021-07-05 MED ORDER — CHLORHEXIDINE GLUCONATE CLOTH 2 % EX PADS
6.0000 | MEDICATED_PAD | Freq: Once | CUTANEOUS | Status: AC
  Administered 2021-07-05: 18:00:00 6 via TOPICAL

## 2021-07-05 MED ORDER — VANCOMYCIN HCL IN DEXTROSE 1-5 GM/200ML-% IV SOLN
1000.0000 mg | INTRAVENOUS | Status: AC
  Administered 2021-07-06: 1000 mg via INTRAVENOUS
  Filled 2021-07-05: qty 200

## 2021-07-05 MED ORDER — MUPIROCIN 2 % EX OINT
1.0000 "application " | TOPICAL_OINTMENT | Freq: Two times a day (BID) | CUTANEOUS | Status: DC
  Administered 2021-07-05 – 2021-07-07 (×3): 1 via NASAL
  Filled 2021-07-05 (×2): qty 22

## 2021-07-05 MED ORDER — CHLORHEXIDINE GLUCONATE CLOTH 2 % EX PADS: 6.0000 | MEDICATED_PAD | Freq: Once | CUTANEOUS | Status: DC

## 2021-07-05 MED ORDER — HYDROCHLOROTHIAZIDE 25 MG PO TABS
25.0000 mg | ORAL_TABLET | Freq: Every day | ORAL | Status: DC
  Administered 2021-07-05 – 2021-07-12 (×7): 25 mg via ORAL
  Filled 2021-07-05 (×7): qty 1

## 2021-07-05 NOTE — Progress Notes (Signed)
Subjective: Patient reports that she had a good night. She is eager to proceed with surgery. No acute events overnight.   Objective: Vital signs in last 24 hours: Temp:  [97.8 F (36.6 C)-98.3 F (36.8 C)] 97.8 F (36.6 C) (01/25 0752) Pulse Rate:  [58-90] 58 (01/25 0752) Resp:  [14-18] 18 (01/25 0752) BP: (116-141)/(65-89) 141/89 (01/25 0752) SpO2:  [96 %-100 %] 96 % (01/25 0752)  Intake/Output from previous day: 01/24 0701 - 01/25 0700 In: 1243 [P.O.:1240; I.V.:3] Out: -  Intake/Output this shift: No intake/output data recorded.  Physical Exam: Patient is awake, A/O X 4, conversant, and in good spirits. She is in NAD and VSS. Speech is fluent and appropriate. MAEW. LUE and LLE 4+/5, slight LUE drift. RUE and RLE 5/5. Sensation to light touch is intact. PERLA, EOMI. CNs grossly intact.     Lab Results: No results for input(s): WBC, HGB, HCT, PLT in the last 72 hours. BMET No results for input(s): NA, K, CL, CO2, GLUCOSE, BUN, CREATININE, CALCIUM in the last 72 hours.  Studies/Results: No results found.  Assessment/Plan: 71 y.o. female with a large right temporal tumor. The patient's neurological exam continues to be stable. Plan for OR on Thursday for tumor resection.    -Keppra 500 twice daily -Subcu heparin -Decadron 4Q6h -Xanax PRN for anxiety -Tumor resection Thursday -NPO at mignight   LOS: 3 days     Marvis Moeller, DNP, NP-C 07/05/2021, 9:01 AM

## 2021-07-05 NOTE — Progress Notes (Signed)
° °  Providing Compassionate, Quality Care - Together  NEUROSURGERY PROGRESS NOTE   S: No issues overnight. OR tomorrow  O: EXAM:  BP (!) 141/89 (BP Location: Right Arm)    Pulse (!) 58    Temp 97.8 F (36.6 C) (Oral)    Resp 18    Ht 5\' 2"  (1.575 m)    Wt 95.3 kg    SpO2 96%    BMI 38.41 kg/m   Awake, alert, oriented x3 PERRL Speech fluent, appropriate  CNs grossly intact  Slight L sided weakness, w drift  ASSESSMENT:  71 y.o. female with   Right large temporal tumor  PLAN: - OR tomorrow for resection, updated family and answered all of their questions -NPO midnight    Thank you for allowing me to participate in this patient's care.  Please do not hesitate to call with questions or concerns.   Elwin Sleight, [facility] Cell: [phone]

## 2021-07-05 NOTE — Progress Notes (Signed)
Chaplain visited patient before the prayer service scheduled for 7 PM.  The patient was eating dinner and had one visitor.  Chaplain clarified expectations for the gathering in the [place]. Chaplain and security guards met friends and family in downstairs lobby and staff brought the patient to the [facility] where a prayer service was held. It lasted 45 minutes. Afterward, the group thanked the hospital staff for their support and departed. Chaplain support will continue to be available after patient's surgery tomorrow morning. Rev. [place] Kadie Balestrieri Pager [phone]

## 2021-07-05 NOTE — Progress Notes (Signed)
° °  Providing Compassionate, Quality Care - Together  NEUROSURGERY PROGRESS NOTE   S: No issues overnight.   O: EXAM:  BP (!) 141/89 (BP Location: Right Arm)    Pulse (!) 58    Temp 97.8 F (36.6 C) (Oral)    Resp 18    Ht 5\' 2"  (1.575 m)    Wt 95.3 kg    SpO2 96%    BMI 38.41 kg/m   Awake, alert, oriented x3 PERRL Speech fluent, appropriate  CNs grossly intact  MAE, slight L sided weakness + drift, left   ASSESSMENT:  71 y.o. 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  Please do not hesitate to call with questions or concerns.   Elwin Sleight, [facility] Cell: [phone]

## 2021-07-05 NOTE — TOC Progression Note (Signed)
Transition of Care [facility]) - Progression Note    Patient Details  Name: Denise Wright MRN: [id number] Date of Birth: 01/15/1951  Transition of Care [facility]) CM/SW [place], RN Phone Number:[phone]  07/05/2021, 9:36 AM  Clinical Narrative:    Currently no TOC needs. Plan for OR this week (Thursday) for resection. TOC will follow for any disposition needs.         Expected Discharge Plan and Services                                                 Social Determinants of Health (SDOH) Interventions    Readmission Risk Interventions No flowsheet data found.

## 2021-07-06 ENCOUNTER — Other Ambulatory Visit: Payer: Self-pay

## 2021-07-06 ENCOUNTER — Inpatient Hospital Stay (HOSPITAL_COMMUNITY): Payer: BC Managed Care – PPO

## 2021-07-06 ENCOUNTER — Encounter (HOSPITAL_COMMUNITY)
Admission: EM | Disposition: A | Discharge status: Rehabilitation Facility | Payer: Self-pay | Source: Home / Self Care | Attending: Neurological Surgery

## 2021-07-06 ENCOUNTER — Encounter (HOSPITAL_COMMUNITY): Payer: Self-pay | Admitting: Internal Medicine

## 2021-07-06 HISTORY — PX: APPLICATION OF CRANIAL NAVIGATION: SHX6578

## 2021-07-06 HISTORY — PX: CRANIOTOMY: SHX93

## 2021-07-06 LAB — TYPE AND SCREEN
ABO/RH(D): O POS
Antibody Screen: NEGATIVE

## 2021-07-06 LAB — POCT I-STAT 7, (LYTES, BLD GAS, ICA,H+H)
Acid-base deficit: 3 mmol/L — ABNORMAL HIGH (ref 0.0–2.0)
Bicarbonate: 21.2 mmol/L (ref 20.0–28.0)
Calcium, Ion: 1.02 mmol/L — ABNORMAL LOW (ref 1.15–1.40)
HCT: 25 % — ABNORMAL LOW (ref 36.0–46.0)
Hemoglobin: 8.5 g/dL — ABNORMAL LOW (ref 12.0–15.0)
O2 Saturation: 100 %
Patient temperature: 35.7
Potassium: 3.3 mmol/L — ABNORMAL LOW (ref 3.5–5.1)
Sodium: 137 mmol/L (ref 135–145)
TCO2: 22 mmol/L (ref 22–32)
pCO2 arterial: 32.2 mmHg (ref 32.0–48.0)
pH, Arterial: 7.422 (ref 7.350–7.450)
pO2, Arterial: 322 mmHg — ABNORMAL HIGH (ref 83.0–108.0)

## 2021-07-06 LAB — ABO/RH: ABO/RH(D): O POS

## 2021-07-06 SURGERY — CRANIOTOMY TUMOR EXCISION
Anesthesia: General | Laterality: Right

## 2021-07-06 MED ORDER — MANNITOL 25 % IV SOLN
INTRAVENOUS | Status: DC | PRN
  Administered 2021-07-06: 50 g via INTRAVENOUS

## 2021-07-06 MED ORDER — THROMBIN 5000 UNITS EX SOLR
CUTANEOUS | Status: AC
  Filled 2021-07-06: qty 5000

## 2021-07-06 MED ORDER — ONDANSETRON HCL 4 MG/2ML IJ SOLN
INTRAMUSCULAR | Status: DC | PRN
Start: 2021-07-06 — End: 2021-07-06
  Administered 2021-07-06: 4 mg via INTRAVENOUS

## 2021-07-06 MED ORDER — BACITRACIN ZINC 500 UNIT/GM EX OINT
TOPICAL_OINTMENT | CUTANEOUS | Status: AC
  Filled 2021-07-06: qty 28.35

## 2021-07-06 MED ORDER — ONDANSETRON HCL 4 MG/2ML IJ SOLN: 4.0000 mg | INTRAMUSCULAR | Status: DC | PRN

## 2021-07-06 MED ORDER — LEVETIRACETAM IN NACL 1000 MG/100ML IV SOLN
1000.0000 mg | Freq: Once | INTRAVENOUS | Status: AC
  Administered 2021-07-06: 1000 mg via INTRAVENOUS
  Filled 2021-07-06: qty 100

## 2021-07-06 MED ORDER — MORPHINE SULFATE (PF) 2 MG/ML IV SOLN
1.0000 mg | INTRAVENOUS | Status: DC | PRN
  Administered 2021-07-08: 2 mg via INTRAVENOUS
  Filled 2021-07-06 (×2): qty 1

## 2021-07-06 MED ORDER — PHENYLEPHRINE 40 MCG/ML (10ML) SYRINGE FOR IV PUSH (FOR BLOOD PRESSURE SUPPORT)
PREFILLED_SYRINGE | INTRAVENOUS | Status: DC | PRN
Start: 2021-07-06 — End: 2021-07-06
  Administered 2021-07-06 (×2): 80 ug via INTRAVENOUS

## 2021-07-06 MED ORDER — PROPOFOL 10 MG/ML IV BOLUS
INTRAVENOUS | Status: AC
  Filled 2021-07-06: qty 20

## 2021-07-06 MED ORDER — SODIUM CHLORIDE 0.9 % IV SOLN: INTRAVENOUS | Status: DC | PRN

## 2021-07-06 MED ORDER — LIDOCAINE-EPINEPHRINE 1 %-1:100000 IJ SOLN
INTRAMUSCULAR | Status: AC
  Filled 2021-07-06: qty 1

## 2021-07-06 MED ORDER — LIDOCAINE 2% (20 MG/ML) 5 ML SYRINGE
INTRAMUSCULAR | Status: DC | PRN
  Administered 2021-07-06: 60 mg via INTRAVENOUS

## 2021-07-06 MED ORDER — CLEVIDIPINE BUTYRATE 0.5 MG/ML IV EMUL
0.0000 mg/h | INTRAVENOUS | Status: DC
  Administered 2021-07-06: 2 mg/h via INTRAVENOUS
  Administered 2021-07-06: 10 mg/h via INTRAVENOUS
  Administered 2021-07-07: 8 mg/h via INTRAVENOUS
  Administered 2021-07-07: 6 mg/h via INTRAVENOUS
  Filled 2021-07-06 (×3): qty 50

## 2021-07-06 MED ORDER — THROMBIN 5000 UNITS EX SOLR: OROMUCOSAL | Status: DC | PRN

## 2021-07-06 MED ORDER — PROMETHAZINE HCL 25 MG PO TABS: 12.5000 mg | ORAL_TABLET | ORAL | Status: DC | PRN

## 2021-07-06 MED ORDER — CHLORHEXIDINE GLUCONATE 0.12 % MT SOLN
OROMUCOSAL | Status: AC
  Filled 2021-07-06: qty 15

## 2021-07-06 MED ORDER — FENTANYL CITRATE (PF) 250 MCG/5ML IJ SOLN
INTRAMUSCULAR | Status: DC | PRN
  Administered 2021-07-06: 100 ug via INTRAVENOUS

## 2021-07-06 MED ORDER — THROMBIN 20000 UNITS EX SOLR
CUTANEOUS | Status: AC
  Filled 2021-07-06: qty 20000

## 2021-07-06 MED ORDER — DOCUSATE SODIUM 100 MG PO CAPS
100.0000 mg | ORAL_CAPSULE | Freq: Two times a day (BID) | ORAL | Status: DC
  Administered 2021-07-07 – 2021-07-12 (×10): 100 mg via ORAL
  Filled 2021-07-06 (×10): qty 1

## 2021-07-06 MED ORDER — CHLORHEXIDINE GLUCONATE CLOTH 2 % EX PADS
6.0000 | MEDICATED_PAD | Freq: Every day | CUTANEOUS | Status: DC
  Administered 2021-07-07 – 2021-07-09 (×3): 6 via TOPICAL

## 2021-07-06 MED ORDER — HYDROCODONE-ACETAMINOPHEN 5-325 MG PO TABS
1.0000 | ORAL_TABLET | ORAL | Status: DC | PRN
  Administered 2021-07-06 – 2021-07-10 (×6): 1 via ORAL
  Filled 2021-07-06 (×6): qty 1

## 2021-07-06 MED ORDER — SUCCINYLCHOLINE CHLORIDE 200 MG/10ML IV SOSY
PREFILLED_SYRINGE | INTRAVENOUS | Status: DC | PRN
  Administered 2021-07-06: 100 mg via INTRAVENOUS

## 2021-07-06 MED ORDER — VANCOMYCIN HCL 750 MG/150ML IV SOLN
750.0000 mg | Freq: Two times a day (BID) | INTRAVENOUS | Status: AC
  Administered 2021-07-07 (×2): 750 mg via INTRAVENOUS
  Filled 2021-07-06 (×2): qty 150

## 2021-07-06 MED ORDER — CHLORHEXIDINE GLUCONATE 0.12 % MT SOLN
15.0000 mL | Freq: Once | OROMUCOSAL | Status: AC
  Administered 2021-07-06: 15 mL via OROMUCOSAL

## 2021-07-06 MED ORDER — LABETALOL HCL 5 MG/ML IV SOLN
10.0000 mg | INTRAVENOUS | Status: DC | PRN
  Administered 2021-07-07 – 2021-07-08 (×3): 20 mg via INTRAVENOUS
  Filled 2021-07-06: qty 8
  Filled 2021-07-06: qty 4

## 2021-07-06 MED ORDER — PHENYLEPHRINE HCL-NACL 20-0.9 MG/250ML-% IV SOLN
INTRAVENOUS | Status: DC | PRN
  Administered 2021-07-06: 20 ug/min via INTRAVENOUS

## 2021-07-06 MED ORDER — FENTANYL CITRATE (PF) 100 MCG/2ML IJ SOLN: 25.0000 ug | INTRAMUSCULAR | Status: DC | PRN

## 2021-07-06 MED ORDER — SODIUM CHLORIDE 0.9 % IV SOLN
0.0500 ug/kg/min | INTRAVENOUS | Status: AC
  Administered 2021-07-06: .2 ug/kg/min via INTRAVENOUS
  Filled 2021-07-06: qty 5000

## 2021-07-06 MED ORDER — 0.9 % SODIUM CHLORIDE (POUR BTL) OPTIME
TOPICAL | Status: DC | PRN
  Administered 2021-07-06: 3000 mL

## 2021-07-06 MED ORDER — ORAL CARE MOUTH RINSE: 15.0000 mL | Freq: Once | OROMUCOSAL | Status: AC

## 2021-07-06 MED ORDER — FENTANYL CITRATE (PF) 250 MCG/5ML IJ SOLN
INTRAMUSCULAR | Status: AC
  Filled 2021-07-06: qty 5

## 2021-07-06 MED ORDER — PROPOFOL 10 MG/ML IV BOLUS
INTRAVENOUS | Status: DC | PRN
  Administered 2021-07-06: 20 mg via INTRAVENOUS
  Administered 2021-07-06: 30 mg via INTRAVENOUS
  Administered 2021-07-06: 110 mg via INTRAVENOUS
  Administered 2021-07-06: 20 mg via INTRAVENOUS

## 2021-07-06 MED ORDER — BUPIVACAINE-EPINEPHRINE 0.5% -1:200000 IJ SOLN
INTRAMUSCULAR | Status: AC
  Filled 2021-07-06: qty 1

## 2021-07-06 MED ORDER — SODIUM CHLORIDE 0.9 % IV SOLN: INTRAVENOUS | Status: DC

## 2021-07-06 MED ORDER — BUPIVACAINE-EPINEPHRINE (PF) 0.5% -1:200000 IJ SOLN
INTRAMUSCULAR | Status: DC | PRN
  Administered 2021-07-06: 15 mL

## 2021-07-06 MED ORDER — ONDANSETRON HCL 4 MG PO TABS: 4.0000 mg | ORAL_TABLET | ORAL | Status: DC | PRN

## 2021-07-06 MED ORDER — PROPOFOL 500 MG/50ML IV EMUL
INTRAVENOUS | Status: DC | PRN
  Administered 2021-07-06: 50 ug/kg/min via INTRAVENOUS

## 2021-07-06 MED ORDER — DEXAMETHASONE SODIUM PHOSPHATE 10 MG/ML IJ SOLN
INTRAMUSCULAR | Status: DC | PRN
Start: 2021-07-06 — End: 2021-07-06
  Administered 2021-07-06: 10 mg via INTRAVENOUS

## 2021-07-06 MED ORDER — PROPOFOL 1000 MG/100ML IV EMUL
INTRAVENOUS | Status: AC
  Filled 2021-07-06: qty 100

## 2021-07-06 MED ORDER — THROMBIN 20000 UNITS EX SOLR: CUTANEOUS | Status: DC | PRN

## 2021-07-06 MED ORDER — ACETAMINOPHEN 10 MG/ML IV SOLN: 1000.0000 mg | Freq: Once | INTRAVENOUS | Status: DC | PRN

## 2021-07-06 MED ORDER — BACITRACIN ZINC 500 UNIT/GM EX OINT
TOPICAL_OINTMENT | CUTANEOUS | Status: DC | PRN
  Administered 2021-07-06: 1 via TOPICAL

## 2021-07-06 MED ORDER — LIDOCAINE-EPINEPHRINE 1 %-1:100000 IJ SOLN
INTRAMUSCULAR | Status: DC | PRN
  Administered 2021-07-06: 15 mL

## 2021-07-06 MED ORDER — PANTOPRAZOLE SODIUM 40 MG IV SOLR
40.0000 mg | Freq: Every day | INTRAVENOUS | Status: DC
  Administered 2021-07-06: 40 mg via INTRAVENOUS
  Filled 2021-07-06: qty 40

## 2021-07-06 MED ORDER — MICROFIBRILLAR COLL HEMOSTAT EX PADS
MEDICATED_PAD | CUTANEOUS | Status: DC | PRN
  Administered 2021-07-06: 1 via TOPICAL

## 2021-07-06 MED ORDER — CLEVIDIPINE BUTYRATE 0.5 MG/ML IV EMUL
INTRAVENOUS | Status: AC
  Filled 2021-07-06: qty 50

## 2021-07-06 SURGICAL SUPPLY — 76 items
BAG COUNTER SPONGE SURGICOUNT (BAG) ×2 IMPLANT
BAG SPNG CNTER NS LX DISP (BAG) ×1
BAND INSRT 18 STRL LF DISP RB (MISCELLANEOUS) ×2
BAND RUBBER #18 3X1/16 STRL (MISCELLANEOUS) ×4 IMPLANT
BLADE CLIPPER SURG (BLADE) ×2 IMPLANT
BUR CARBIDE MATCH 3.0 (BURR) ×1 IMPLANT
BUR SPIRAL ROUTER 2.3 (BUR) ×2 IMPLANT
CANISTER SUCT 3000ML PPV (MISCELLANEOUS) ×3 IMPLANT
COVER BURR HOLE 14 (Orthopedic Implant) ×3 IMPLANT
DRAIN JACKSON RD 7FR 3/32 (WOUND CARE) IMPLANT
DRAIN JP 10F RND RADIO (DRAIN) IMPLANT
DRAPE MICROSCOPE LEICA (MISCELLANEOUS) ×2 IMPLANT
DRAPE NEUROLOGICAL W/INCISE (DRAPES) ×2 IMPLANT
DRAPE SHEET LG 3/4 BI-LAMINATE (DRAPES) ×2 IMPLANT
DRAPE WARM FLUID 44X44 (DRAPES) ×2 IMPLANT
DRSG TELFA 3X8 NADH (GAUZE/BANDAGES/DRESSINGS) ×2 IMPLANT
DURAPREP 6ML APPLICATOR 50/CS (WOUND CARE) ×2 IMPLANT
ELECT COATED BLADE 2.86 ST (ELECTRODE) ×2 IMPLANT
ELECT REM PT RETURN 9FT ADLT (ELECTROSURGICAL) ×2
ELECTRODE REM PT RTRN 9FT ADLT (ELECTROSURGICAL) ×1 IMPLANT
EVACUATOR SILICONE 100CC (DRAIN) IMPLANT
FORCEPS BIPOLAR SPETZLER 8 1.0 (NEUROSURGERY SUPPLIES) ×1 IMPLANT
GAUZE 4X4 16PLY ~~LOC~~+RFID DBL (SPONGE) ×1 IMPLANT
GLOVE SRG 8 PF TXTR STRL LF DI (GLOVE) ×2 IMPLANT
GLOVE SURG LTX SZ8 (GLOVE) ×5 IMPLANT
GLOVE SURG UNDER POLY LF SZ8 (GLOVE) ×6
GOWN STRL REUS W/ TWL LRG LVL3 (GOWN DISPOSABLE) IMPLANT
GOWN STRL REUS W/ TWL XL LVL3 (GOWN DISPOSABLE) ×1 IMPLANT
GOWN STRL REUS W/TWL 2XL LVL3 (GOWN DISPOSABLE) IMPLANT
GOWN STRL REUS W/TWL LRG LVL3 (GOWN DISPOSABLE) ×6
GOWN STRL REUS W/TWL XL LVL3 (GOWN DISPOSABLE) ×4
GRAFT DURAGEN MATRIX 5WX7L (Graft) ×1 IMPLANT
HEMOSTAT POWDER KIT SURGIFOAM (HEMOSTASIS) ×2 IMPLANT
HEMOSTAT SURGICEL 2X14 (HEMOSTASIS) ×2 IMPLANT
IV NS 1000ML (IV SOLUTION) ×2
IV NS 1000ML BAXH (IV SOLUTION) ×1 IMPLANT
KIT BASIN OR (CUSTOM PROCEDURE TRAY) ×2 IMPLANT
KIT TURNOVER KIT B (KITS) ×2 IMPLANT
MARKER SPHERE PSV REFLC 13MM (MARKER) ×5 IMPLANT
NEEDLE HYPO 22GX1.5 SAFETY (NEEDLE) ×2 IMPLANT
NS IRRIG 1000ML POUR BTL (IV SOLUTION) ×4 IMPLANT
PACK CRANIOTOMY CUSTOM (CUSTOM PROCEDURE TRAY) ×2 IMPLANT
PAD DRESSING TELFA 3X8 NADH (GAUZE/BANDAGES/DRESSINGS) IMPLANT
PATTIES SURGICAL .5 X.5 (GAUZE/BANDAGES/DRESSINGS) ×1 IMPLANT
PATTIES SURGICAL .5 X3 (DISPOSABLE) ×1 IMPLANT
PATTIES SURGICAL 1X1 (DISPOSABLE) IMPLANT
PERFORATOR LRG  14-11MM (BIT) ×2
PERFORATOR LRG 14-11MM (BIT) ×1 IMPLANT
PIN MAYFIELD SKULL DISP (PIN) ×2 IMPLANT
PLATE DOUBLE Y CMF 6H (Plate) ×1 IMPLANT
RETRACTOR LONE STAR DISPOSABLE (INSTRUMENTS) ×6 IMPLANT
SCREW UNIII AXS SD 1.5X4 (Screw) ×16 IMPLANT
SET CARTRIDGE AND TUBING (SET/KITS/TRAYS/PACK) ×1 IMPLANT
SET TUBING IRRIGATION DISP (TUBING) ×1 IMPLANT
SPONGE SURGIFOAM ABS GEL 100 (HEMOSTASIS) ×2 IMPLANT
STAPLER VISISTAT 35W (STAPLE) ×4 IMPLANT
STOCKINETTE 6  STRL (DRAPES) ×2
STOCKINETTE 6 STRL (DRAPES) ×1 IMPLANT
STRIP CLOSURE SKIN 1/2X4 (GAUZE/BANDAGES/DRESSINGS) ×1 IMPLANT
SUT ETHILON 3 0 FSL (SUTURE) IMPLANT
SUT ETHILON 3 0 PS 1 (SUTURE) IMPLANT
SUT NURALON 4 0 TR CR/8 (SUTURE) ×5 IMPLANT
SUT VIC AB 0 CT1 18XCR BRD8 (SUTURE) ×1 IMPLANT
SUT VIC AB 0 CT1 8-18 (SUTURE) ×2
SUT VIC AB 2-0 CP2 18 (SUTURE) ×4 IMPLANT
SUT VICRYL RAPIDE 4/0 PS 2 (SUTURE) ×2 IMPLANT
TIP SHEAR CVD EXTENDED 36KH (INSTRUMENTS) IMPLANT
TIP STANDARD 36KHZ (INSTRUMENTS) ×2
TIP STD 36KHZ (INSTRUMENTS) IMPLANT
TOWEL GREEN STERILE (TOWEL DISPOSABLE) ×2 IMPLANT
TOWEL GREEN STERILE FF (TOWEL DISPOSABLE) ×2 IMPLANT
TRAY FOLEY MTR SLVR 16FR STAT (SET/KITS/TRAYS/PACK) ×2 IMPLANT
TUBE CONNECTING 12X1/4 (SUCTIONS) ×2 IMPLANT
UNDERPAD 30X36 HEAVY ABSORB (UNDERPADS AND DIAPERS) ×1 IMPLANT
WATER STERILE IRR 1000ML POUR (IV SOLUTION) ×2 IMPLANT
WRENCH TORQUE 36KHZ (INSTRUMENTS) ×1 IMPLANT

## 2021-07-06 NOTE — Anesthesia Preprocedure Evaluation (Signed)
Anesthesia Evaluation  Patient identified by MRN, date of birth, ID band Patient awake    Reviewed: Allergy & Precautions, NPO status , Patient's Chart, lab work & pertinent test results  Airway Mallampati: II  TM Distance: >3 FB Neck ROM: Full    Dental no notable dental hx.    Pulmonary neg pulmonary ROS,    Pulmonary exam normal        Cardiovascular hypertension,  Rhythm:Regular Rate:Normal     Neuro/Psych Temporal lobe mass negative psych ROS   GI/Hepatic negative GI ROS, Neg liver ROS,   Endo/Other  Hypothyroidism   Renal/GU negative Renal ROS  negative genitourinary   Musculoskeletal negative musculoskeletal ROS (+)   Abdominal Normal abdominal exam  (+)   Peds  Hematology  (+) anemia ,   Anesthesia Other Findings   Reproductive/Obstetrics                             Anesthesia Physical Anesthesia Plan  ASA: 3  Anesthesia Plan: General   Post-op Pain Management:    Induction: Intravenous  PONV Risk Score and Plan: 3 and Ondansetron, Dexamethasone and Treatment may vary due to age or medical condition  Airway Management Planned: Mask and Oral ETT  Additional Equipment: Arterial line  Intra-op Plan:   Post-operative Plan: Extubation in OR  Informed Consent: I have reviewed the patients History and Physical, chart, labs and discussed the procedure including the risks, benefits and alternatives for the proposed anesthesia with the patient or authorized representative who has indicated his/her understanding and acceptance.     Dental advisory given  Plan Discussed with: CRNA  Anesthesia Plan Comments: (Lab Results      Component                Value               Date                      WBC                      6.7                 07/01/2021                HGB                      10.6 (L)            07/01/2021                HCT                      33.1 (L)             07/01/2021                MCV                      92.5                07/01/2021                PLT                      315  07/01/2021           Lab Results      Component                Value               Date                      NA                       140                 07/01/2021                K                        4.1                 07/01/2021                CO2                      25                  07/01/2021                GLUCOSE                  147 (H)             07/01/2021                BUN                      15                  07/01/2021                CREATININE               0.98                07/01/2021                CALCIUM                  8.7 (L)             07/01/2021                GFRNONAA                 >60                 07/01/2021          )        Anesthesia Quick Evaluation

## 2021-07-06 NOTE — Progress Notes (Signed)
Pharmacy Antibiotic Note  Denise Wright is a 71 y.o. female admitted on 06/29/2021 with right large temporal tumor s/p neurosurgery intervention 1/26. Pharmacy has been consulted for vancomycin dosing for surgical prophylaxis x 24 hrs. Last SCr 0.98 on 1/21. Pre-op vancomycin 1g IV x 1 given today at 1259.  Plan: Vancomycin 750mg  IV q12h x 2 doses Monitor SCr Pharmacy will sign off consult and monitor peripherally  Height: 5\' 2"  (157.5 cm) Weight: 93.5 kg (206 lb 2.1 oz) IBW/kg (Calculated) : 50.1  Temp (24hrs), Avg:97.9 F (36.6 C), Min:97.3 F (36.3 C), Max:98.9 F (37.2 C)  Recent Labs  Lab 07/01/21 0226  WBC 6.7  CREATININE 0.98    Estimated Creatinine Clearance: 57.7 mL/min (by C-G formula based on SCr of 0.98 mg/dL).    Allergies  Allergen Reactions   Fexofenadine Hives   Penicillins Itching and Other (See Comments)    Pulling sensation in body    Arturo Morton, PharmD, BCPS Please check AMION for all [facility] contact numbers Clinical Pharmacist 07/06/2021 9:29 PM

## 2021-07-06 NOTE — Anesthesia Procedure Notes (Signed)
Arterial Line Insertion Start/End1/26/2023 1:00 PM, 07/06/2021 1:13 PM Performed by: Imagene Riches, CRNA, CRNA  Patient location: Pre-op. Preanesthetic checklist: patient identified, IV checked, site marked, risks and benefits discussed, surgical consent, monitors and equipment checked, pre-op evaluation, timeout performed and anesthesia consent Left, radial was placed Catheter size: 20 G Hand hygiene performed , maximum sterile barriers used  and Seldinger technique used Allen's test indicative of satisfactory collateral circulation Attempts: 1 Procedure performed without using ultrasound guided technique.

## 2021-07-06 NOTE — Anesthesia Procedure Notes (Signed)
Procedure Name: Intubation Date/Time: 07/06/2021 2:18 PM Performed by: Dorann Lodge, CRNA Pre-anesthesia Checklist: Patient identified, Emergency Drugs available, Suction available and Patient being monitored Patient Re-evaluated:Patient Re-evaluated prior to induction Oxygen Delivery Method: Circle System Utilized Preoxygenation: Pre-oxygenation with 100% oxygen Induction Type: IV induction Ventilation: Mask ventilation without difficulty Laryngoscope Size: Mac and 4 Grade View: Grade I Tube type: Oral Tube size: 7.0 mm Number of attempts: 1 Airway Equipment and Method: Stylet Placement Confirmation: ETT inserted through vocal cords under direct vision, positive ETCO2 and breath sounds checked- equal and bilateral Secured at: 22 cm Tube secured with: Tape Dental Injury: Teeth and Oropharynx as per pre-operative assessment

## 2021-07-06 NOTE — Op Note (Signed)
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 Pieter Partridge C. Horacio Werth, DO  ASSISTANT: Dr. Consuella Lose, MD; Weston Brass, NP  ANESTHESIA: General Endotracheal  EBL: 100 cc  SPECIMENS: Right temporal tumor  DRAINS: None  COMPLICATIONS: None  CONDITION: Hemodynamically stable  HISTORY: Denise Wright is a 71 y.o. female the presented to the hospital with "not feeling right".  She felt discombobulated and had multiple episodes of abnormal driving decisions.  CT of the brain revealed a large right temporal tumor, metastatic work-up was negative.  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  We discussed maximal safe resection given the involvement of some deep critical structures and I discussed risks and benefits with the patient and her family.  Informed consent was obtained.  PROCEDURE IN DETAIL: The patient was brought to the operating room. After induction of general anesthesia, the patient was positioned on the operative table in the supine position.  Her head was placed in the [place] head holder and slightly turned to the left to expose the right frontotemporal region.  The right frontotemporal region was clipped free of hair.  Neuro navigation was registered and verified to have excellent accuracy.  All pressure points were meticulously padded. Skin incision was then marked out and prepped and draped in the usual sterile fashion.  Using a 10 blade, incision was made sharply down to  the periosteum in a curvilinear fashion over the right frontotemporal region.  Raney clips were applied.  Bovie electrocautery was used to elevate a myocutaneous flap anteriorly and incised the temporal fascia down to the pericranium.  The musculocutaneous flap was reflected anteriorly.  Self-retaining retractors were placed.  Using neuro navigation, craniotomy was planned.  Using high-speed drill, a frontotemporal craniotomy was performed in a normal fashion and elevated.  The portion of the superior frontal region of the dura was quite adherent and tore during elevation of the bone flap.  Epidural hemostasis was achieved with bipolar cautery and Surgifoam.  The microscope was sterilely draped and brought into the field at this point.  The dura was opened sharply with 15 blade and Metzenbaum scissors in a curvilinear fashion reflected anteriorly.  Using bipolar cautery, a corticectomy was made in the middle of the superior temporal gyrus horizontally along its tract as this was near the superior portion of the tumor.  This was continued anteriorly and inferiorly into the middle temporal gyrus.  Using bipolar cautery and suction a peritumoral dissection was performed inferiorly, posteriorly and anteriorly.  There were multiple thrombosed veins along the edges of the tumor.  The tumor had a dark purplish discoloration, was quite firm and had multiple areas of's yellow/white thickened tumor along the border.  Once the inferior anterior and posterior dissection was completed, I then used bipolar cautery and suction technique to resect through the superior temporal gyrus through the superior portion of the tumor in efforts to protect the middle cerebral arteries and the sylvian fissure.  This was performed with bipolar cautery, microscissors.  There were multiple regions of the tumor that had thrombosed veins as well as dark purplish discoloration.  Multiple specimens were saved  for permanent analysis.  Continued  depth dissection with bipolar cautery and suction was then verified with neuro navigation to be at the depth of the border of the tumor.  This was along the deep structures and internal capsule therefore a small portion of the border of the tumor was left behind.  Along the inferior depth of the tumor, there were periventricular feeders that were coagulated and cut sharply, I entered the temporal horn of the lateral ventricle.  This was then protected with Gelfoam.  Using gentle suction technique and ultrasonic aspirator, superior portions of the tumor were gently dissected in a subpeel fashion from the superior temporal gyrus.  The borders were then checked with neuro navigation and carefully dissected with ultrasonic aspiration to normal white matter in the posterior inferior anterior directions.  Again the borders were analyzed for further tumoral tissue, there was normal white matter posteriorly inferiorly and anteriorly and this coordinated with the neuro navigation.  Again the deep medial portion of the tumor was left behind as well as the superior portion involving the middle cerebral arteries.  Hemostasis was achieved with Surgifoam and cotton balls.  The resection cavity was copiously irrigated and noted to be excellently hemostatic.  A large piece of Gelfoam was placed over the temporal horn lateral ventricle.  Surgicel was placed throughout the resection cavity.  The dura was then closed with 4-0 Nurolon suture and epidural hemostasis was achieved with Surgifoam.  DuraGen was then placed over the craniotomy site.  The previous craniotomy flap was then fixed with the cranial plating system in its normal position.  Self-retaining retractors were taken out of the wound.  Soft tissue hemostasis was achieved with bipolar cautery, Raney clips were removed.  The temporal fascia was closed with 0 Vicryl suture.  The galea was closed with 2-0 Vicryl suture and the skin was closed with staples.  Sterile dressing  was applied.  At the end of the case all sponge, needle, and instrument counts were correct. The patient was then transferred to the stretcher, May[place] head holder removed, extubated, and taken to the post-anesthesia care unit in stable hemodynamic condition.

## 2021-07-06 NOTE — Transfer of Care (Signed)
Immediate Anesthesia Transfer of Care Note  Patient: Denise Wright  Procedure(s) Performed: CRANIOTOMY TUMOR EXCISION (Right) APPLICATION OF CRANIAL NAVIGATION (Right)  Patient Location: PACU  Anesthesia Type:General  Level of Consciousness: drowsy and patient cooperative  Airway & Oxygen Therapy: Patient Spontanous Breathing and Patient connected to face mask oxygen  Post-op Assessment: Report given to RN and Post -op Vital signs reviewed and stable  Post vital signs: Reviewed and stable  Last Vitals:  Vitals Value Taken Time  BP 156/85 07/06/21 2001  Temp    Pulse 147 07/06/21 2000  Resp 16 07/06/21 2006  SpO2 99 % 07/06/21 2000  Vitals shown include unvalidated device data.  Last Pain:  Vitals:   07/06/21 1243  TempSrc: Oral  PainSc: 0-No pain      Patients Stated Pain Goal: 2 (77/93/90 3009)  Complications: No notable events documented.

## 2021-07-06 NOTE — Anesthesia Postprocedure Evaluation (Signed)
Anesthesia Post Note  Patient: Denise Wright  Procedure(s) Performed: CRANIOTOMY TUMOR EXCISION (Right) APPLICATION OF CRANIAL NAVIGATION (Right)     Patient location during evaluation: PACU Anesthesia Type: General Level of consciousness: awake Pain management: pain level controlled Vital Signs Assessment: post-procedure vital signs reviewed and stable Cardiovascular status: stable Postop Assessment: no apparent nausea or vomiting Anesthetic complications: no   No notable events documented.  Last Vitals:  Vitals:   07/06/21 2015 07/06/21 2016  BP: (!) 150/71 (!) 149/73  Pulse:    Resp: 20 (!) 21  Temp:    SpO2:  100%    Last Pain:  Vitals:   07/06/21 2000  TempSrc:   PainSc: Asleep                 Ivelisse Culverhouse

## 2021-07-06 NOTE — Progress Notes (Signed)
° °  Providing Compassionate, Quality Care - Together  NEUROSURGERY PROGRESS NOTE   S: No issues overnight  O: EXAM:  BP (!) 170/66    Pulse (!) 56    Temp 98.9 F (37.2 C) (Oral)    Resp 16    Ht 5\' 2"  (1.575 m)    Wt 93.5 kg    SpO2 98%    BMI 37.70 kg/m     Awake, alert, oriented x3 PERRL Speech fluent, appropriate  CNs grossly intact  Slight L sided weakness, w drift   ASSESSMENT:  71 y.o. female with    Right large temporal tumor   PLAN: - OR today, I answered all of her questions and informed consent was obtained    Thank you for allowing me to participate in this patient's care.  Please do not hesitate to call with questions or concerns.   Elwin Sleight, [facility] Cell: [phone]

## 2021-07-07 ENCOUNTER — Encounter (HOSPITAL_COMMUNITY): Payer: Self-pay | Admitting: Neurological Surgery

## 2021-07-07 ENCOUNTER — Inpatient Hospital Stay (HOSPITAL_COMMUNITY): Payer: BC Managed Care – PPO

## 2021-07-07 LAB — MRSA NEXT GEN BY PCR, NASAL: MRSA by PCR Next Gen: NOT DETECTED

## 2021-07-07 LAB — BASIC METABOLIC PANEL
Anion gap: 7 (ref 5–15)
BUN: 18 mg/dL (ref 8–23)
CO2: 23 mmol/L (ref 22–32)
Calcium: 8.5 mg/dL — ABNORMAL LOW (ref 8.9–10.3)
Chloride: 109 mmol/L (ref 98–111)
Creatinine, Ser: 1 mg/dL (ref 0.44–1.00)
GFR, Estimated: >60 mL/min (ref >60)
Glucose, Bld: 157 mg/dL — ABNORMAL HIGH (ref 70–99)
Potassium: 4.1 mmol/L (ref 3.5–5.1)
Sodium: 139 mmol/L (ref 135–145)

## 2021-07-07 LAB — CBC
HCT: 38 % (ref 36.0–46.0)
Hemoglobin: 12.8 g/dL (ref 12.0–15.0)
MCH: 31 pg (ref 26.0–34.0)
MCHC: 33.7 g/dL (ref 30.0–36.0)
MCV: 92 fL (ref 80.0–100.0)
Platelets: 334 10*3/uL (ref 150–400)
RBC: 4.13 MIL/uL (ref 3.87–5.11)
RDW: 14.4 % (ref 11.5–15.5)
WBC: 18.5 10*3/uL — ABNORMAL HIGH (ref 4.0–10.5)
nRBC: 0 % (ref 0.0–0.2)

## 2021-07-07 IMAGING — MR MR HEAD WO/W CM
14 of 16 series · 42 of 48 positions shown · IV contrast (gadavist)
Comparison: Preoperative study [DATE]

CLINICAL DATA: Brain/CNS neoplasm, staging postop

EXAM:
MRI HEAD WITHOUT AND WITH CONTRAST
TECHNIQUE: Multiplanar, multiecho pulse sequences of the brain and surrounding
structures were obtained without and with intravenous contrast.
CONTRAST:  9.3mL GADAVIST GADOBUTROL 1 MMOL/ML IV SOLN

[Series 9: DWI · axial · 3.0mm · 0.88mm/px · z∈[-171,-33]mm · 8 of 100 slices shown (1 of 4)]
[im 1/100]
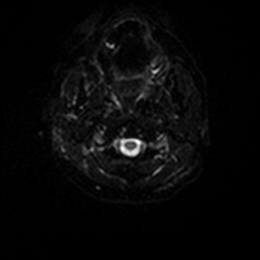
[im 15/100]
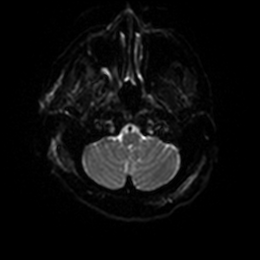
[im 29/100]
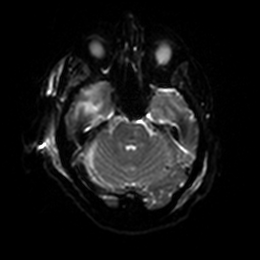
[im 43/100]
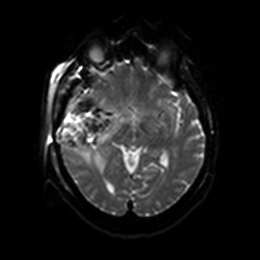
[im 57/100]
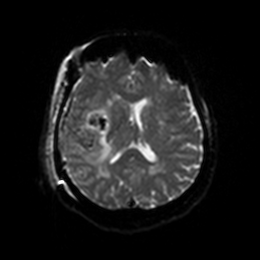
[im 71/100]
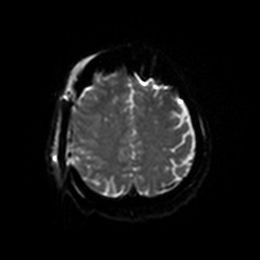
[im 85/100]
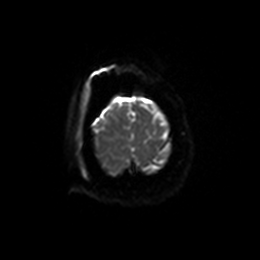
[im 100/100]
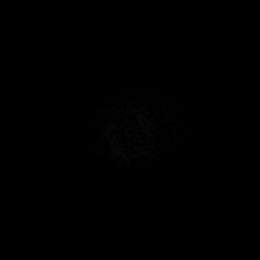

[Series 10: DWI · axial · 3.0mm · 0.88mm/px · z∈[-171,-33]mm · 3 of 50 slices shown (2 of 4)]
[im 1/50]
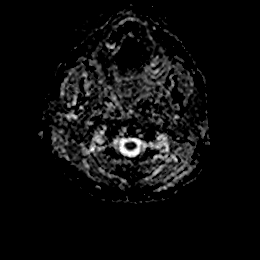
[im 25/50]
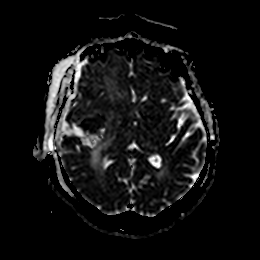
[im 50/50]
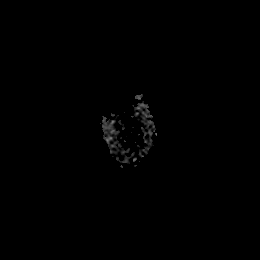

[Series 11: DWI · coronal · 4.0mm · 0.88mm/px · 5 of 68 slices shown (3 of 4)]
[im 1/68]
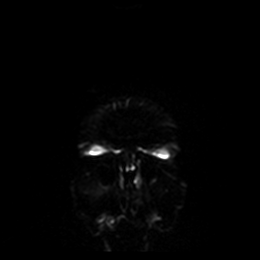
[im 17/68]
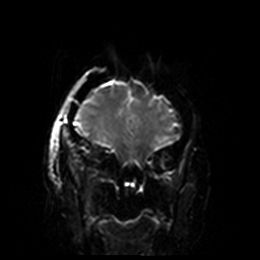
[im 34/68]
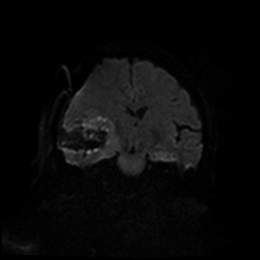
[im 51/68]
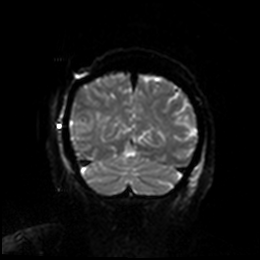
[im 68/68]
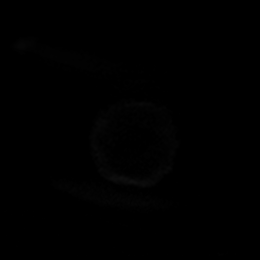

[Series 12: DWI · coronal · 4.0mm · 0.88mm/px · 2 of 34 slices shown (4 of 4)]
[im 1/34]
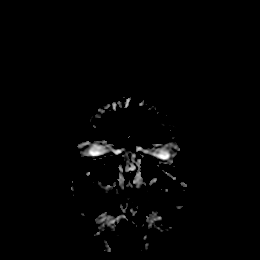
[im 34/34]
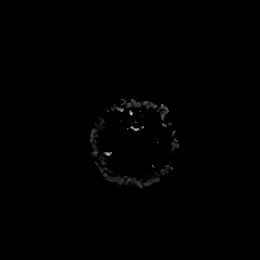

[Series 13: T1 · sagittal · 5.0mm · 0.75mm/px · 2 of 25 slices shown]
[im 1/25]
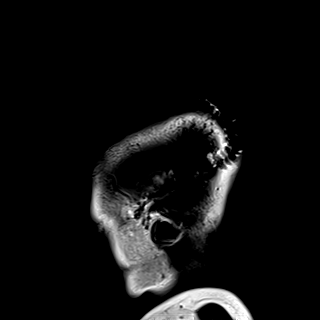
[im 25/25]
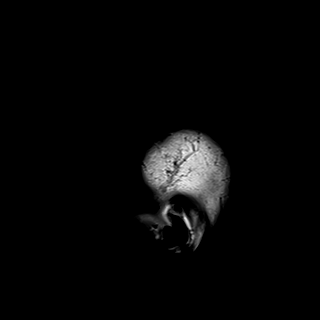

[Series 14: T2 · axial · 5.0mm · 0.72mm/px · z∈[-172,-31]mm · 2 of 26 slices shown]
[im 1/26]
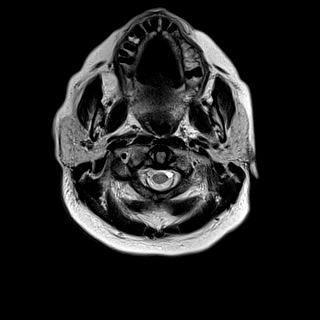
[im 26/26]
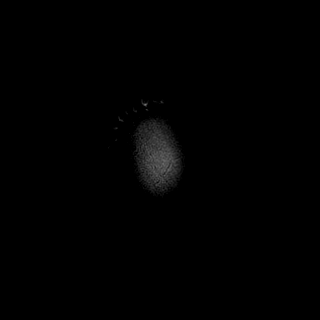

[Series 15: FLAIR · axial · 5.0mm · 0.45mm/px · z∈[-168,-27]mm · 2 of 26 slices shown]
[im 1/26]
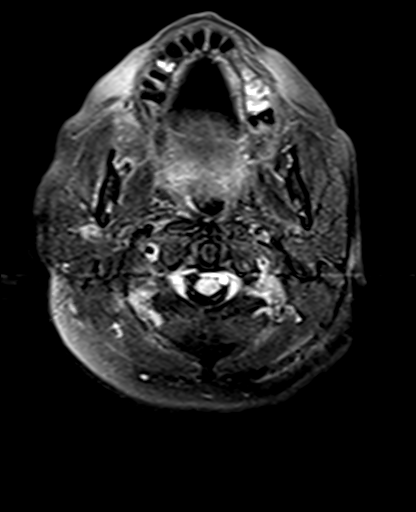
[im 26/26]
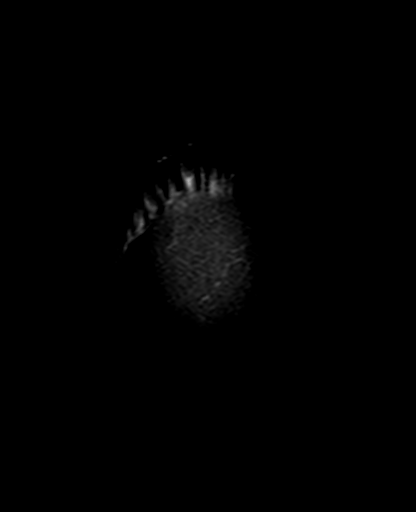

[Series 16: mag_images · axial · 3.0mm · 0.90mm/px · z∈[-169,-25]mm · 3 of 52 slices shown]
[im 1/52]
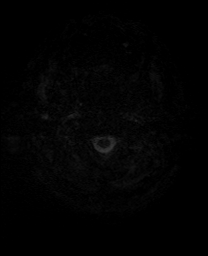
[im 26/52]
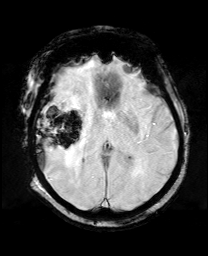
[im 52/52]
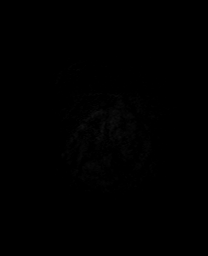

[Series 17: pha_images · axial · 3.0mm · 0.90mm/px · z∈[-169,-28]mm · 3 of 51 slices shown]
[im 1/51]
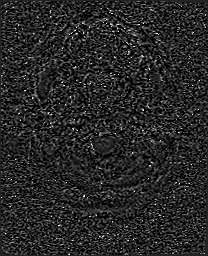
[im 26/51]
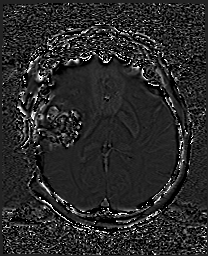
[im 51/51]
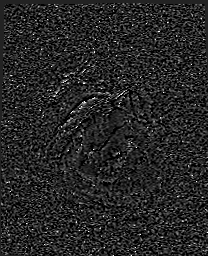

[Series 18: swi_images · axial · 3.0mm · 0.90mm/px · z∈[-169,-25]mm · 3 of 52 slices shown]
[im 1/52]
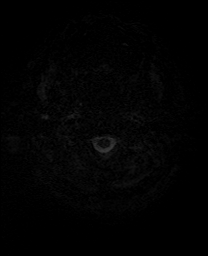
[im 26/52]
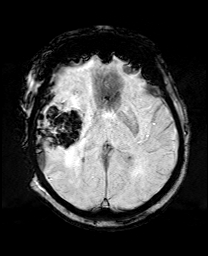
[im 52/52]
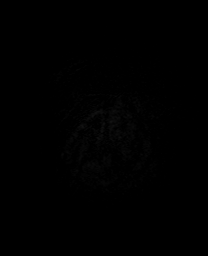

[Series 19: mip_images(sw) · axial · 24.0mm · 0.90mm/px · z∈[-159,-35]mm · 3 of 45 slices shown]
[im 1/45]
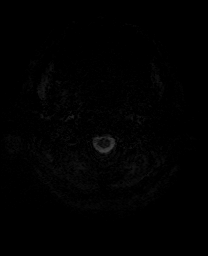
[im 23/45]
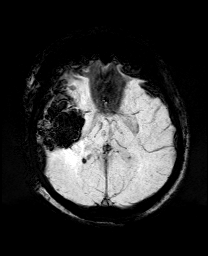
[im 45/45]
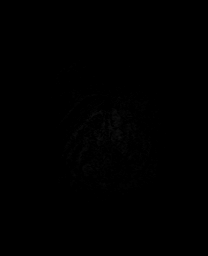

[Series 21: T2 post-contrast · coronal · 5.0mm · 0.72mm/px · 2 of 29 slices shown]
[im 1/29]
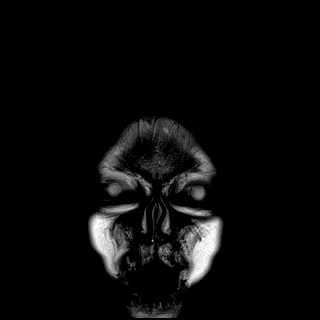
[im 29/29]
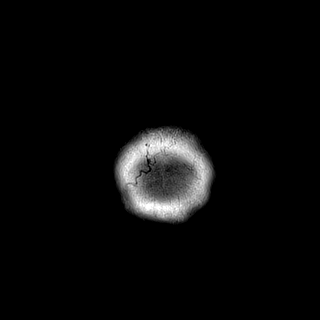

[Series 23: T1 post-contrast · coronal · 5.0mm · 0.34mm/px · 2 of 29 slices shown (1 of 2)]
[im 1/29]
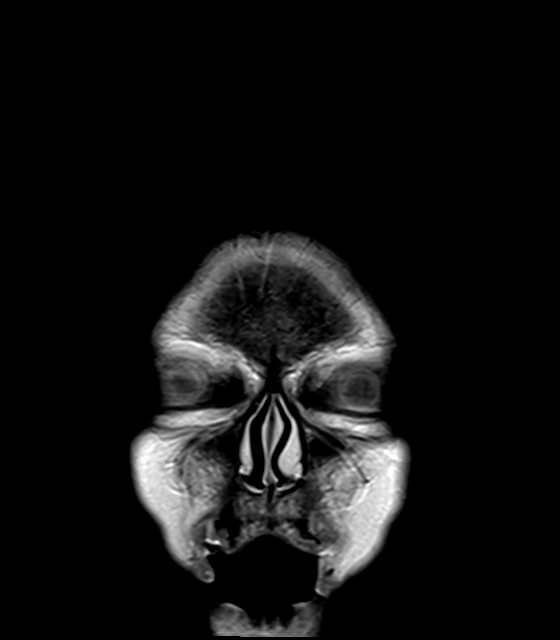
[im 29/29]
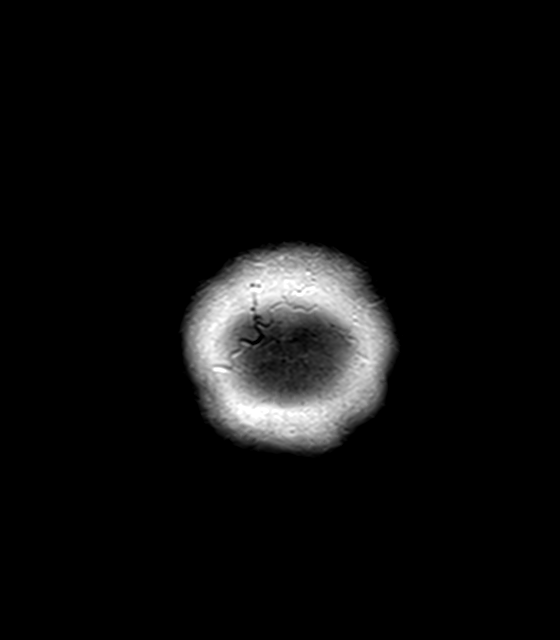

[Series 24: T1 post-contrast · sagittal · 5.0mm · 0.72mm/px · 2 of 25 slices shown (2 of 2)]
[im 1/25]
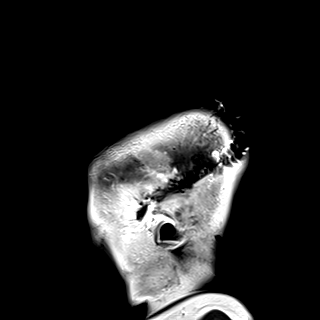
[im 25/25]
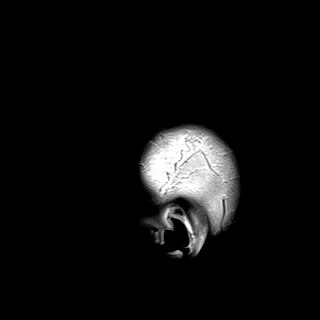

[42 of 48 positions shown; findings below may reference images not displayed]

FINDINGS: Brain: Postoperative changes of frontotemporal mass resection.
Fluid, blood products, and air present within the resection cavity.
Extra-axial collection is present underlying the craniotomy. There
is bifrontal extra-axial air. Intrinsic T1 hyperintense blood
products are present. There is some superimposed enhancement at the
resection cavity margins that is likely postoperative. Mild
nodularity is present inferiorly (series 23, image 18).

Remains surrounding T2 FLAIR hyperintensity similar in extent to the
preoperative study. Mass effect remains present including effacement
the right lateral ventricle and mild leftward midline shift. There
is no hydrocephalus.

Vascular: Major vessel flow voids at the skull base are preserved.

Skull and upper cervical spine: Normal marrow signal is preserved.

Sinuses/Orbits: Minor mucosal thickening.  Orbits are unremarkable.

Other: Sella is unremarkable.  Mastoid air cells are clear.
IMPRESSION: Expected postoperative changes of gross total resection of right
frontotemporal mass. Attention on follow-up recommended for mildly
nodular enhancement at the inferior margin. Persistent mass effect
with mild leftward midline shift.

## 2021-07-07 MED ORDER — PANTOPRAZOLE SODIUM 40 MG PO TBEC
40.0000 mg | DELAYED_RELEASE_TABLET | Freq: Every day | ORAL | Status: DC
  Administered 2021-07-07 – 2021-07-11 (×5): 40 mg via ORAL
  Filled 2021-07-07 (×5): qty 1

## 2021-07-07 MED ORDER — CHLORHEXIDINE GLUCONATE 0.12 % MT SOLN
OROMUCOSAL | Status: AC
  Administered 2021-07-07: 15 mL
  Filled 2021-07-07: qty 15

## 2021-07-07 MED ORDER — HEPARIN SODIUM (PORCINE) 5000 UNIT/ML IJ SOLN
5000.0000 [IU] | Freq: Three times a day (TID) | INTRAMUSCULAR | Status: DC
  Administered 2021-07-08 – 2021-07-12 (×14): 5000 [IU] via SUBCUTANEOUS
  Filled 2021-07-07 (×14): qty 1

## 2021-07-07 MED ORDER — GADOBUTROL 1 MMOL/ML IV SOLN
9.3000 mL | Freq: Once | INTRAVENOUS | Status: AC | PRN
  Administered 2021-07-07: 9.3 mL via INTRAVENOUS

## 2021-07-07 NOTE — Progress Notes (Signed)
° °  Providing Compassionate, Quality Care - Together  NEUROSURGERY PROGRESS NOTE   S: No issues overnight. Sleepy but awake, mri completed  O: EXAM:  BP 133/70    Pulse 71    Temp 97.9 F (36.6 C) (Axillary)    Resp 16    Ht 5\' 2"  (1.575 m)    Wt 93.5 kg    SpO2 95%    BMI 37.70 kg/m   Awake, oriented x3 PERRL Speech fluent, appropriate CNs grossly intact  5/5 RUE/RLE  3/5 LUE, 4/5 RLE Incision c/d/I, expected periorbital swelling  ASSESSMENT:  71 y.o. female with   Large Right temporal tumor  -s/p R crani for tumor on 07/06/2021  PLAN: - cont icu -mri reviewed, small residual tumor, no large infarct or hematoma, some pneumocephaly -cont keppra -SQH tomorrow -pt/ot eval    Thank you for allowing me to participate in this patient's care.  Please do not hesitate to call with questions or concerns.   Elwin Sleight, [facility] Cell: [phone]

## 2021-07-07 NOTE — Progress Notes (Signed)
Subjective: Patient reports resting comfortably in bed with daughter and two sons at bedside. No acute events overnight.   Objective: Vital signs in last 24 hours: Temp:  [97.3 F (36.3 C)-99.4 F (37.4 C)] 99.4 F (37.4 C) (01/27 0400) Pulse Rate:  [56-103] 78 (01/27 0800) Resp:  [15-27] 16 (01/27 0800) BP: (124-178)/(63-88) 136/74 (01/27 0800) SpO2:  [90 %-100 %] 93 % (01/27 0800) Arterial Line BP: (82-135)/(59-119) 103/75 (01/27 0700) Weight:  [93.5 kg] 93.5 kg (01/26 1243)  Intake/Output from previous day: 01/26 0701 - 01/27 0700 In: 3096.4 [I.V.:2646.3; IV Piggyback:450.1] Out: 4250 [Urine:4100; Blood:150] Intake/Output this shift: Total I/O In: 11.6 [I.V.:11.6] Out: -   Physical Exam: Patient is obtunded but will arouse to answer questions and follow commands, although not sustained. She is oriented to person, place, and situation, but is disoriented to time. She has minimal spontaneous movement in her LUE and LLE. She was unable to follow commands in her LUE and LLE. Moves RUE and RLE to command and spontaneously. PERLA, EOMI. CNs grossly intact.     Lab Results: Recent Labs    07/06/21 1455 07/07/21 0726  WBC  --  18.5*  HGB 8.5* 12.8  HCT 25.0* 38.0  PLT  --  334   BMET Recent Labs    07/06/21 1455  NA 137  K 3.3*    Studies/Results: No results found.  Assessment/Plan: Patient is post-op day 1 s/p right frontotemporal craniotomy for resection of supratentorial tumor. She is obtunded and less interactive than she was following surgery last night. Will do CT head to assess for any potential causes of her decreased responsivnress.    -Plan for MRI brain today       LOS: 5 days     Marvis Moeller, DNP, NP-C 07/07/2021, 8:16 AM

## 2021-07-08 NOTE — Evaluation (Signed)
Physical Therapy Evaluation Patient Details Name: Denise Wright MRN: [id number] DOB: Jul 04, 1950 Today's Date: 07/08/2021  History of Present Illness  71 y/o female presented to [place] ED on 06/29/21 for fatigue, memory issues, headaches, weakness. Transferred to [facility]. 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 PMH: HTN, hypothyroidism, normocytic anemia  Clinical Impression  Patient admitted with above diagnosis and procedure. Patient presents with L sided weakness, impaired sitting balance, decreased activity tolerance, impaired cognition, and decreased alertness. Patient required maxA+2 for bed mobility this date. Patient with L lateral lean in sitting requiring min-maxA to maintain sitting despite cues. Patient lethargic throughout session so deferred OOB mobility. Patient will benefit from skilled PT services during acute stay to address listed deficits. Recommend CIR at discharge to maximize functional independence and safety.        Recommendations for follow up therapy are one component of a multi-disciplinary discharge planning process, led by the attending physician.  Recommendations may be updated based on patient status, additional functional criteria and insurance authorization.  Follow Up Recommendations Acute inpatient rehab (3hours/day)    Assistance Recommended at Discharge Frequent or constant Supervision/Assistance  Patient can return home with the following  Two people to help with walking and/or transfers;Two people to help with bathing/dressing/bathroom;Assistance with feeding;Assistance with cooking/housework;Direct supervision/assist for medications management;Direct supervision/assist for financial management;Assist for transportation;Help with stairs or ramp for entrance    Equipment Recommendations Other (comment) (TBD)  Recommendations for Other Services  Rehab consult     Functional Status Assessment Patient has had a recent decline in their functional status and demonstrates the ability to make significant improvements in function in a reasonable and predictable amount of time.     Precautions / Restrictions Precautions Precautions: Fall Restrictions Weight Bearing Restrictions: No      Mobility  Bed Mobility Overal bed mobility: Needs Assistance Bed Mobility: Supine to Sit, Sit to Supine     Supine to sit: Max assist, +2 for physical assistance, +2 for safety/equipment Sit to supine: Max assist, +2 for physical assistance, +2 for safety/equipment   General bed mobility comments: patient able to initiate bringing R LE over to EOB but overall required maxA+2 to complete bed mobility.    Transfers                   General transfer comment: deferred due to poor sitting balance    Ambulation/Gait                  Stairs            Wheelchair Mobility    Modified Rankin (Stroke Patients Only)       Balance Overall balance assessment: Needs assistance Sitting-balance support: Single extremity supported, Feet unsupported Sitting balance-Leahy Scale: Poor Sitting balance - Comments: min-maxA to maintain sitting balance Postural control: Left lateral lean                                   Pertinent Vitals/Pain Pain Assessment Pain Assessment: Faces Faces Pain Scale: Hurts a little bit Pain Location: head Pain Descriptors / Indicators: Headache Pain Intervention(s): Monitored during session    Home Living Family/patient expects to be discharged to:: Private residence Living Arrangements: Alone Available Help at Discharge: Family;Available 24 hours/day Type of Home: House Home Access: Stairs to enter Entrance Stairs-Rails: Right Entrance Stairs-Number of Steps:  4   Home Layout: One level Home Equipment: None      Prior Function Prior Level of Function : Independent/Modified  Independent;Working/employed;Driving                     Hand Dominance        Extremity/Trunk Assessment   Upper Extremity Assessment Upper Extremity Assessment: Defer to OT evaluation    Lower Extremity Assessment Lower Extremity Assessment: LLE deficits/detail LLE Deficits / Details: Grossly 2+/5 LLE Sensation: decreased proprioception;decreased light touch LLE Coordination: decreased fine motor;decreased gross motor    Cervical / Trunk Assessment Cervical / Trunk Assessment: Normal  Communication   Communication: Other (comment) (low volume, whisper)  Cognition Arousal/Alertness: Lethargic Behavior During Therapy: Flat affect Overall Cognitive Status: Difficult to assess                                 General Comments: following commands intermittently but requires increased time. Patient lethargic throughout and little participation        General Comments General comments (skin integrity, edema, etc.): VSS on RA    Exercises     Assessment/Plan    PT Assessment Patient needs continued PT services  PT Problem List Decreased strength;Decreased activity tolerance;Decreased balance;Decreased mobility;Decreased coordination;Decreased cognition;Decreased knowledge of use of DME;Decreased safety awareness;Decreased knowledge of precautions;Impaired sensation       PT Treatment Interventions DME instruction;Gait training;Therapeutic activities;Stair training;Functional mobility training;Therapeutic exercise;Balance training;Neuromuscular re-education;Patient/family education;Wheelchair mobility training    PT Goals (Current goals can be found in the Care Plan section)  Acute Rehab PT Goals Patient Stated Goal: did not state PT Goal Formulation: With family Time For Goal Achievement: 07/22/21 Potential to Achieve Goals: Good    Frequency Min 3X/week     Co-evaluation PT/OT/SLP Co-Evaluation/Treatment: Yes Reason for Co-Treatment:  Necessary to address cognition/behavior during functional activity;For patient/therapist safety;To address functional/ADL transfers PT goals addressed during session: Mobility/safety with mobility         AM-PAC PT "6 Clicks" Mobility  Outcome Measure Help needed turning from your back to your side while in a flat bed without using bedrails?: Total Help needed moving from lying on your back to sitting on the side of a flat bed without using bedrails?: Total Help needed moving to and from a bed to a chair (including a wheelchair)?: Total Help needed standing up from a chair using your arms (e.g., wheelchair or bedside chair)?: Total Help needed to walk in hospital room?: Total Help needed climbing 3-5 steps with a railing? : Total 6 Click Score: 6    End of Session   Activity Tolerance: Patient limited by lethargy Patient left: in bed;with call bell/phone within reach;with family/visitor present Nurse Communication: Mobility status PT Visit Diagnosis: Muscle weakness (generalized) (M62.81);Difficulty in walking, not elsewhere classified (R26.2);Other symptoms and signs involving the nervous system (I96.789)    Time: 3810-1751 PT Time Calculation (min) (ACUTE ONLY): 27 min   Charges:   PT Evaluation $PT Eval Moderate Complexity: 1 Mod          Savva Beamer A. Gilford Rile PT, DPT Acute Rehabilitation Services Pager [phone] Office [phone]   Linna Hoff 07/08/2021, 12:48 PM

## 2021-07-08 NOTE — Evaluation (Signed)
Occupational Therapy Evaluation Patient Details Name: Denise Wright MRN: [id number] DOB: 07-29-1950 Today's Date: 07/08/2021   History of Present Illness 71 y/o female presented to [place] ED on 06/29/21 for fatigue, memory issues, headaches, weakness. Transferred to [facility]. 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 PMH: HTN, hypothyroidism, normocytic anemia   Clinical Impression   Patient admitted for the diagnosis and procedure above.  PTA she lives alone, has a lot of family locally, worked, drove and needed no assist with mobility, ADL/IADL.  Deficits impacting independence are listed below.  Currently she is needing +2 assist for mobility and ADL completion is at bedlevel.  OT to continue efforts in the acute setting, and AIR is recommended for post acute rehab to maximize function prior to returning home with assist.         Recommendations for follow up therapy are one component of a multi-disciplinary discharge planning process, led by the attending physician.  Recommendations may be updated based on patient status, additional functional criteria and insurance authorization.   Follow Up Recommendations  Acute inpatient rehab (3hours/day)    Assistance Recommended at Discharge Frequent or constant Supervision/Assistance  Patient can return home with the following Two people to help with walking and/or transfers;Assistance with feeding;Help with stairs or ramp for entrance;Assist for transportation;Assistance with cooking/housework;Direct supervision/assist for financial management;Two people to help with bathing/dressing/bathroom;Direct supervision/assist for medications management    Functional Status Assessment  Patient has had a recent decline in their functional status and demonstrates the ability to make significant improvements in function in a reasonable and predictable amount of  time.  Equipment Recommendations  BSC/3in1;Wheelchair (measurements OT);Wheelchair cushion (measurements OT)    Recommendations for Other Services Rehab consult     Precautions / Restrictions Precautions Precautions: Fall Restrictions Weight Bearing Restrictions: No      Mobility Bed Mobility Overal bed mobility: Needs Assistance Bed Mobility: Supine to Sit, Sit to Supine     Supine to sit: Max assist, +2 for physical assistance, +2 for safety/equipment Sit to supine: Max assist, +2 for physical assistance, +2 for safety/equipment        Transfers                   General transfer comment: deferred due to poor sitting balance and decreased LOA      Balance Overall balance assessment: Needs assistance Sitting-balance support: Single extremity supported, Feet unsupported Sitting balance-Leahy Scale: Poor   Postural control: Left lateral lean, Posterior lean                                 ADL either performed or assessed with clinical judgement   ADL Overall ADL's : Needs assistance/impaired Eating/Feeding: Maximal assistance;Bed level   Grooming: Maximal assistance;Bed level   Upper Body Bathing: Maximal assistance;Bed level   Lower Body Bathing: Bed level;Maximal assistance Lower Body Bathing Details (indicate cue type and reason): able to move R leg Upper Body Dressing : Maximal assistance;Bed level   Lower Body Dressing: Maximal assistance;Bed level                       Vision   Vision Assessment?: Vision impaired- to be further tested in functional context Additional Comments: No deficts noted on eval.  Patient R eye is essentially swollen shut.     Perception Perception Perception:  Within Functional Limits   Praxis Praxis Praxis: Intact    Pertinent Vitals/Pain Pain Assessment Pain Assessment: Faces Faces Pain Scale: Hurts a little bit Pain Location: head Pain Descriptors / Indicators: Headache Pain  Intervention(s): Monitored during session     Hand Dominance Right   Extremity/Trunk Assessment Upper Extremity Assessment Upper Extremity Assessment: LUE deficits/detail LUE Deficits / Details: No AROM noted to L UE on Eval. LUE Coordination: decreased fine motor;decreased gross motor   Lower Extremity Assessment Lower Extremity Assessment: Defer to PT evaluation LLE Deficits / Details: Grossly 2+/5 LLE Sensation: decreased proprioception;decreased light touch LLE Coordination: decreased fine motor;decreased gross motor   Cervical / Trunk Assessment Cervical / Trunk Assessment: Normal   Communication Communication Communication: Other (comment) (low volume, whisper)   Cognition Arousal/Alertness: Lethargic Behavior During Therapy: Flat affect Overall Cognitive Status: Difficult to assess                                       General Comments  VSS on RA    Exercises     Shoulder Instructions      Home Living Family/patient expects to be discharged to:: Private residence Living Arrangements: Alone Available Help at Discharge: Family;Available 24 hours/day Type of Home: House Home Access: Stairs to enter [employer] of Steps: 4 Entrance Stairs-Rails: Right Home Layout: One level     Bathroom Shower/Tub: Occupational psychologist: Standard Bathroom Accessibility: Yes   Home Equipment: None          Prior Functioning/Environment Prior Level of Function : Independent/Modified Independent;Working/employed;Driving                        OT Problem List: Decreased strength;Decreased range of motion;Impaired balance (sitting and/or standing);Decreased knowledge of use of DME or AE;Decreased safety awareness;Pain;Impaired UE functional use      OT Treatment/Interventions: Self-care/ADL training;Therapeutic exercise;Therapeutic activities;Patient/family education;DME and/or AE instruction;Balance training    OT  Goals(Current goals can be found in the care plan section) Acute Rehab OT Goals OT Goal Formulation: Patient unable to participate in goal setting Time For Goal Achievement: 07/22/21 Potential to Achieve Goals: Good ADL Goals Pt Will Perform Eating: with min assist;sitting Pt Will Perform Grooming: with min assist;sitting Pt Will Perform Upper Body Bathing: with mod assist;sitting Pt Will Perform Upper Body Dressing: with min assist;sitting Pt Will Transfer to Toilet: squat pivot transfer;bedside commode;with min assist;with +2 assist  OT Frequency: Min 2X/week    Co-evaluation PT/OT/SLP Co-Evaluation/Treatment: Yes Reason for Co-Treatment: Complexity of the patient's impairments (multi-system involvement);For patient/therapist safety PT goals addressed during session: Mobility/safety with mobility OT goals addressed during session: ADL's and self-care      AM-PAC OT "6 Clicks" Daily Activity     Outcome Measure Help from another person eating meals?: A Lot Help from another person taking care of personal grooming?: A Lot Help from another person toileting, which includes using toliet, bedpan, or urinal?: A Lot Help from another person bathing (including washing, rinsing, drying)?: A Lot Help from another person to put on and taking off regular upper body clothing?: A Lot Help from another person to put on and taking off regular lower body clothing?: A Lot 6 Click Score: 12   End of Session Nurse Communication: Mobility status  Activity Tolerance: Patient limited by lethargy Patient left: in bed;with call bell/phone within reach;with bed alarm set;with family/visitor present  OT Visit Diagnosis: Unsteadiness on feet (R26.81);Muscle weakness (generalized) (M62.81);Hemiplegia and hemiparesis Hemiplegia - Right/Left: Left Hemiplegia - dominant/non-dominant: Non-Dominant Hemiplegia - caused by: Other cerebrovascular disease                Time: 8984-2103 OT Time Calculation (min):  27 min Charges:  OT General Charges $OT Visit: 1 Visit OT Evaluation $OT Eval Moderate Complexity: 1 Mod  07/08/2021  RP, OTR/L  Acute Rehabilitation Services  Office:  [phone]   Metta Clines 07/08/2021, 1:08 PM

## 2021-07-08 NOTE — Progress Notes (Signed)
Inpatient Rehab Admissions Coordinator Note:   Per PT/OT patient was screened for CIR candidacy by Xerxes Agrusa Danford Bad, CCC-SLP. At this time, pt has not yet attempted transfers. Pt may have potential to progress to becoming a potential CIR candidate. CIR admissions team will follow and monitor for progress and participation with therapies.    Gayland Curry, [place], [place] Admissions Coordinator [phone] 07/08/21 5:11 PM

## 2021-07-08 NOTE — Progress Notes (Addendum)
Subjective: Patient reports no headache  Objective: Vital signs in last 24 hours: Temp:  [97.9 F (36.6 C)-99.7 F (37.6 C)] 99.6 F (37.6 C) (01/28 0800) Pulse Rate:  [71-97] 92 (01/28 0800) Resp:  [13-25] 23 (01/28 0800) BP: (125-149)/(59-133) 141/70 (01/28 0800) SpO2:  [84 %-95 %] 92 % (01/28 0800)  Intake/Output from previous day: 01/27 0701 - 01/28 0700 In: 74 [I.V.:11.6; IV Piggyback:62.4] Out: 700 [Urine:700] Intake/Output this shift: Total I/O In: -  Out: 100 [Urine:100]  Drowsy, eyes closed, right periorbital swelling Slurred speech but oriented to person FC x 4, LUE 2/5 proximally and 3/5 distally, LLE 3/5 Dressing c/d  Lab Results: Recent Labs    07/06/21 1455 07/07/21 0726  WBC  --  18.5*  HGB 8.5* 12.8  HCT 25.0* 38.0  PLT  --  334   BMET Recent Labs    07/06/21 1455 07/07/21 0726  NA 137 139  K 3.3* 4.1  CL  --  109  CO2  --  23  GLUCOSE  --  157*  BUN  --  18  CREATININE  --  1.00  CALCIUM  --  8.5*    Studies/Results: MR BRAIN W WO CONTRAST  Result Date: 07/07/2021 CLINICAL DATA:  Brain/CNS neoplasm, staging postop EXAM: MRI HEAD WITHOUT AND WITH CONTRAST TECHNIQUE: Multiplanar, multiecho pulse sequences of the brain and surrounding structures were obtained without and with intravenous contrast. CONTRAST:  9.31mL GADAVIST GADOBUTROL 1 MMOL/ML IV SOLN COMPARISON:  Preoperative study 06/30/2021 FINDINGS: Brain: Postoperative changes of frontotemporal mass resection. Fluid, blood products, and air present within the resection cavity. Extra-axial collection is present underlying the craniotomy. There is bifrontal extra-axial air. Intrinsic T1 hyperintense blood products are present. There is some superimposed enhancement at the resection cavity margins that is likely postoperative. Mild nodularity is present inferiorly (series 23, image 18). Remains surrounding T2 FLAIR hyperintensity similar in extent to the preoperative study. Mass effect remains  present including effacement the right lateral ventricle and mild leftward midline shift. There is no hydrocephalus. Vascular: Major vessel flow voids at the skull base are preserved. Skull and upper cervical spine: Normal marrow signal is preserved. Sinuses/Orbits: Minor mucosal thickening.  Orbits are unremarkable. Other: Sella is unremarkable.  Mastoid air cells are clear. IMPRESSION: Expected postoperative changes of gross total resection of right frontotemporal mass. Attention on follow-up recommended for mildly nodular enhancement at the inferior margin. Persistent mass effect with mild leftward midline shift. Electronically Signed   By: Macy Mis M.D.   On: 07/07/2021 16:41    Assessment/Plan: 71 yo F s/p crani for right temporal lobe tumor - cont supportive ICU care - dexamethasone, Keppra - lheparin subQ for DVT ppx   Vallarie Mare 07/08/2021, 11:02 AM

## 2021-07-09 MED ORDER — DEXAMETHASONE 4 MG PO TABS
4.0000 mg | ORAL_TABLET | Freq: Three times a day (TID) | ORAL | Status: DC
  Administered 2021-07-09 – 2021-07-12 (×9): 4 mg via ORAL
  Filled 2021-07-09 (×9): qty 1

## 2021-07-09 MED ORDER — CHLORHEXIDINE GLUCONATE CLOTH 2 % EX PADS
6.0000 | MEDICATED_PAD | Freq: Every day | CUTANEOUS | Status: DC
  Administered 2021-07-09 – 2021-07-12 (×4): 6 via TOPICAL

## 2021-07-09 NOTE — Progress Notes (Signed)
Subjective: Patient reports no significant headache  Objective: Vital signs in last 24 hours: Temp:  [98.1 F (36.7 C)-99 F (37.2 C)] 98.1 F (36.7 C) (01/29 0800) Pulse Rate:  [65-90] 78 (01/29 1000) Resp:  [16-27] 22 (01/29 1000) BP: (117-139)/(60-76) 124/67 (01/29 1000) SpO2:  [90 %-100 %] 93 % (01/29 1000)  Intake/Output from previous day: 01/28 0701 - 01/29 0700 In: -  Out: 1100 [Urine:1100] Intake/Output this shift: Total I/O In: 200 [P.O.:200] Out: -   More alert than yesterday, eyes open to voice, FC x 4. LLE and LUE 3/5.  Mild left facial droop.  Mild slurring of speech Incision with baci ointment, clean  Lab Results: Recent Labs    07/06/21 1455 07/07/21 0726  WBC  --  18.5*  HGB 8.5* 12.8  HCT 25.0* 38.0  PLT  --  334   BMET Recent Labs    07/06/21 1455 07/07/21 0726  NA 137 139  K 3.3* 4.1  CL  --  109  CO2  --  23  GLUCOSE  --  157*  BUN  --  18  CREATININE  --  1.00  CALCIUM  --  8.5*    Studies/Results: MR BRAIN W WO CONTRAST  Result Date: 07/07/2021 CLINICAL DATA:  Brain/CNS neoplasm, staging postop EXAM: MRI HEAD WITHOUT AND WITH CONTRAST TECHNIQUE: Multiplanar, multiecho pulse sequences of the brain and surrounding structures were obtained without and with intravenous contrast. CONTRAST:  9.37mL GADAVIST GADOBUTROL 1 MMOL/ML IV SOLN COMPARISON:  Preoperative study 06/30/2021 FINDINGS: Brain: Postoperative changes of frontotemporal mass resection. Fluid, blood products, and air present within the resection cavity. Extra-axial collection is present underlying the craniotomy. There is bifrontal extra-axial air. Intrinsic T1 hyperintense blood products are present. There is some superimposed enhancement at the resection cavity margins that is likely postoperative. Mild nodularity is present inferiorly (series 23, image 18). Remains surrounding T2 FLAIR hyperintensity similar in extent to the preoperative study. Mass effect remains present including  effacement the right lateral ventricle and mild leftward midline shift. There is no hydrocephalus. Vascular: Major vessel flow voids at the skull base are preserved. Skull and upper cervical spine: Normal marrow signal is preserved. Sinuses/Orbits: Minor mucosal thickening.  Orbits are unremarkable. Other: Sella is unremarkable.  Mastoid air cells are clear. IMPRESSION: Expected postoperative changes of gross total resection of right frontotemporal mass. Attention on follow-up recommended for mildly nodular enhancement at the inferior margin. Persistent mass effect with mild leftward midline shift. Electronically Signed   By: Macy Mis M.D.   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

## 2021-07-10 ENCOUNTER — Other Ambulatory Visit: Payer: Self-pay | Admitting: Radiation Therapy

## 2021-07-10 NOTE — Progress Notes (Signed)
Subjective: Patient reports that she is beginning to feel better. Her only complaint is of a "twinge" and itch at the inferior portion of her incision. No acute events overnight.   Objective: Vital signs in last 24 hours: Temp:  [97.6 F (36.4 C)-98.8 F (37.1 C)] 98.2 F (36.8 C) (01/30 0758) Pulse Rate:  [65-96] 75 (01/30 0800) Resp:  [15-23] 22 (01/30 0800) BP: (121-150)/(66-87) 129/70 (01/30 0800) SpO2:  [90 %-96 %] 92 % (01/30 0800)  Intake/Output from previous day: 01/29 0701 - 01/30 0700 In: 950 [P.O.:950] Out: 1100 [Urine:1100] Intake/Output this shift: Total I/O In: 250 [P.O.:250] Out: -   Physical Exam: Patient is awake and A/O X 4.  She is in NAD and VSS. Doing well. Speech is fluent and appropriate. MAEW. RUE / RLE 5/5. LUE 3/5, LLE 4/5 Sensation to light touch is intact. PERLA, EOMI. CNs grossly intact. Periorbital swelling. Incision is well approximated with no drainage, erythema, or edema.     Lab Results: No results for input(s): WBC, HGB, HCT, PLT in the last 72 hours. BMET No results for input(s): NA, K, CL, CO2, GLUCOSE, BUN, CREATININE, CALCIUM in the last 72 hours.  Studies/Results: No results found.  Assessment/Plan: Patient is post-op day 4 s/p right frontotemporal craniotomy for resection of supratentorial tumor. She is continuing to recover well. Neuro  exam slowly improving. PT/OT recommending acute inpatient rehab.    -Awaiting transfer to the floor - PT/OT - slow dex taper - Keppra    LOS: 8 days     Marvis Moeller, DNP, NP-C 07/10/2021, 8:10 AM

## 2021-07-10 NOTE — Progress Notes (Signed)
Physical Therapy Treatment Patient Details Name: Denise Wright MRN: [id number] DOB: 1950-11-16 Today's Date: 07/10/2021   History of Present Illness 71 y/o female presented to [facility] ED on 06/29/21 for fatigue, memory issues, headaches, weakness. Transferred to [facility]. 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 PMH: HTN, hypothyroidism, normocytic anemia    PT Comments    Patient progressing well able to ambulate to bathroom with mod A of 1 and cues for attention to task.  She demonstrates L side inattention and weakness, but as gait progressed moving L LE better.  She seems to have excellent family support with family in the room this morning.  Feel she remains appropriate for acute inpatient rehab at d/c.  PT will continue to follow acutely.    Recommendations for follow up therapy are one component of a multi-disciplinary discharge planning process, led by the attending physician.  Recommendations may be updated based on patient status, additional functional criteria and insurance authorization.  Follow Up Recommendations  Acute inpatient rehab (3hours/day)     Assistance Recommended at Discharge Frequent or constant Supervision/Assistance  Patient can return home with the following Assistance with cooking/housework;Direct supervision/assist for medications management;Direct supervision/assist for financial management;Assist for transportation;Help with stairs or ramp for entrance;A lot of help with bathing/dressing/bathroom;A little help with walking and/or transfers   Equipment Recommendations  Rolling walker (2 wheels)    Recommendations for Other Services       Precautions / Restrictions Precautions Precautions: Fall     Mobility  Bed Mobility Overal bed mobility: Needs Assistance Bed Mobility: Supine to Sit     Supine to sit: HOB elevated, Mod assist     General bed  mobility comments: assist for L LE and scooting to EOB    Transfers Overall transfer level: Needs assistance Equipment used: Rolling walker (2 wheels) Transfers: Sit to/from Stand Sit to Stand: Mod assist, Min assist           General transfer comment: assist from EOB to stand to walker for L UE placement and some lifting help, from toilet in bathroom min A for balance    Ambulation/Gait Ambulation/Gait assistance: Mod assist, Min assist Gait Distance (Feet): 12 Feet (x 2) Assistive device: Rolling walker (2 wheels) Gait Pattern/deviations: Step-through pattern, Decreased stride length, Step-to pattern, Wide base of support, Trunk flexed       General Gait Details: mod progressing to min A as pt more upright and taking steps with L LE; initially more shuffling, flexed and A for L side awareness. walked to bathroom then to recliner   Stairs             Wheelchair Mobility    Modified Rankin (Stroke Patients Only)       Balance Overall balance assessment: Needs assistance Sitting-balance support: Feet supported Sitting balance-Leahy Scale: Fair Sitting balance - Comments: on toilet in bathroom only occasional UE support   Standing balance support: Bilateral upper extremity supported, Reliant on assistive device for balance Standing balance-Leahy Scale: Poor                              Cognition Arousal/Alertness: Awake/alert Behavior During Therapy: WFL for tasks assessed/performed Overall Cognitive Status: Impaired/Different from baseline Area of Impairment: Attention, Safety/judgement                   Current Attention Level: Selective  Safety/Judgement: Decreased awareness of deficits, Decreased awareness of safety     General Comments: decreased L side awareness, trying to alternate her attention while walking to ask daughter to tell someone on the phone to bring her squeeze ball and asking for her phone to read her  devotionals while in the bathroom; needed cues for focus on task and L UE placement in standing and sitting        Exercises      General Comments General comments (skin integrity, edema, etc.): VSS on RA; daughter and son in the room      Pertinent Vitals/Pain Pain Assessment Faces Pain Scale: Hurts a little bit Pain Location: head Pain Descriptors / Indicators: Aching Pain Intervention(s): Monitored during session    Home Living                          Prior Function            PT Goals (current goals can now be found in the care plan section) Progress towards PT goals: Progressing toward goals    Frequency    Min 3X/week      PT Plan Current plan remains appropriate    Co-evaluation              AM-PAC PT "6 Clicks" Mobility   Outcome Measure  Help needed turning from your back to your side while in a flat bed without using bedrails?: A Lot Help needed moving from lying on your back to sitting on the side of a flat bed without using bedrails?: A Lot Help needed moving to and from a bed to a chair (including a wheelchair)?: A Lot Help needed standing up from a chair using your arms (e.g., wheelchair or bedside chair)?: A Lot Help needed to walk in hospital room?: Total Help needed climbing 3-5 steps with a railing? : Total 6 Click Score: 10    End of Session Equipment Utilized During Treatment: Gait belt Activity Tolerance: Patient tolerated treatment well Patient left: in chair;with call bell/phone within reach   PT Visit Diagnosis: Muscle weakness (generalized) (M62.81);Difficulty in walking, not elsewhere classified (R26.2);Other symptoms and signs involving the nervous system (R29.898)     Time: 6553-7482 PT Time Calculation (min) (ACUTE ONLY): 42 min  Charges:  $Gait Training: 8-22 mins $Therapeutic Activity: 8-22 mins                     Magda Kiel, PT Acute Rehabilitation  Services [phone] Office:[phone] 07/10/2021    [name] 07/10/2021, 1:00 PM

## 2021-07-10 NOTE — Progress Notes (Signed)
Inpatient Rehab Admissions Coordinator:  ? ?Per therapy recommendations,  patient was screened for CIR candidacy by Keithen Capo, MS, CCC-SLP. At this time, Pt. Appears to be a a potential candidate for CIR. I will place   order for rehab consult per protocol for full assessment. Please contact me any with questions. ? ?Jazlyne Gauger, MS, CCC-SLP ?Rehab Admissions Coordinator  ?[phone] (celll) ?[phone] (office) ? ?

## 2021-07-11 ENCOUNTER — Other Ambulatory Visit: Payer: Self-pay | Admitting: Radiation Therapy

## 2021-07-11 DIAGNOSIS — C719 Malignant neoplasm of brain, unspecified: Secondary | ICD-10-CM

## 2021-07-11 MED ORDER — MENTHOL 3 MG MT LOZG
1.0000 | LOZENGE | OROMUCOSAL | Status: DC | PRN
  Administered 2021-07-11 – 2021-07-12 (×2): 3 mg via ORAL
  Filled 2021-07-11: qty 9

## 2021-07-11 MED ORDER — PHENOL 1.4 % MT LIQD
1.0000 | OROMUCOSAL | Status: DC | PRN
  Administered 2021-07-11 – 2021-07-12 (×2): 1 via OROMUCOSAL
  Filled 2021-07-11: qty 177

## 2021-07-11 NOTE — Progress Notes (Signed)
Transferred patient to 4NP14 with all belongings.

## 2021-07-11 NOTE — Progress Notes (Addendum)
Inpatient Rehab Admissions Coordinator:   Spoke to pt's daughter over the phone to review goals/expectations of CIR stay.  I let her know that average length of stay on CIR was about 14 days (dependent upon progress) an goals would likely require 24/7 supervision initially at discharge.  She states that family is prepared to provide that level of assist.  I will start insurance authorization request today, once OT has had a chance to work with patient.    Shann Medal, PT, DPT Admissions Coordinator [phone] 07/11/21  11:27 AM

## 2021-07-11 NOTE — Progress Notes (Signed)
Subjective: Patient reports that her throat is "scratchy." Otherwise she stated she is doing well. No acute events overnight.   Objective: Vital signs in last 24 hours: Temp:  [97.7 F (36.5 C)-98.2 F (36.8 C)] 97.7 F (36.5 C) (01/31 0800) Pulse Rate:  [60-86] 62 (01/31 0800) Resp:  [16-31] 24 (01/31 0800) BP: (107-139)/(55-74) 126/69 (01/31 0800) SpO2:  [87 %-97 %] 93 % (01/31 0800)  Intake/Output from previous day: 01/30 0701 - 01/31 0700 In: 1030 [P.O.:1030] Out: 700 [Urine:700] Intake/Output this shift: No intake/output data recorded.  Physical Exam: Patient is awake and A/O X 4.  She is in NAD and VSS. Doing well. Speech is fluent and appropriate. MAEW. RUE / RLE 5/5. LUE 3/5, LLE 4/5 Sensation to light touch is intact. PERLA, EOMI. CNs grossly intact. Periorbital swelling. Incision is well approximated with no drainage, erythema, or edema.      Lab Results: No results for input(s): WBC, HGB, HCT, PLT in the last 72 hours. BMET No results for input(s): NA, K, CL, CO2, GLUCOSE, BUN, CREATININE, CALCIUM in the last 72 hours.  Studies/Results: No results found.  Assessment/Plan: Patient is post-op day 5 s/p right frontotemporal craniotomy for resection of supratentorial tumor. She is continuing to make progress. Neuro  exam slowly improving. PT/OT recommending acute inpatient rehab. Order for rehab consult has been placed.      -Awaiting transfer to the floor - PT/OT - slow dex taper - Keppra   LOS: 9 days     Marvis Moeller, DNP, NP-C 07/11/2021, 8:22 AM

## 2021-07-11 NOTE — Progress Notes (Signed)
Occupational Therapy Treatment Patient Details Name: Denise Wright MRN: [id number] DOB: [birth year] Today's Date: 07/11/2021   History of present illness 71 y/o female presented to [facility] ED on 06/29/21 for fatigue, memory issues, headaches, weakness. Transferred to [facility]. 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 PMH: HTN, hypothyroidism, normocytic anemia   OT comments  Pt is making excellent progress and is very motivated to become more independentl. Requires Mod A with mobility and ADL due to L inattention, L sided weakness and deficits listed below. Continue to recommend AIR to maximize functional level of independence to facilitate safe DC home with assistance of supportive family.    Recommendations for follow up therapy are one component of a multi-disciplinary discharge planning process, led by the attending physician.  Recommendations may be updated based on patient status, additional functional criteria and insurance authorization.    Follow Up Recommendations  Acute inpatient rehab (3hours/day)    Assistance Recommended at Discharge Frequent or constant Supervision/Assistance  Patient can return home with the following  A lot of help with walking and/or transfers;A lot of help with bathing/dressing/bathroom;Assistance with cooking/housework;Direct supervision/assist for medications management;Direct supervision/assist for financial management;Assist for transportation;Help with stairs or ramp for entrance   Equipment Recommendations  BSC/3in1;Wheelchair (measurements OT);Wheelchair cushion (measurements OT)    Recommendations for Other Services Rehab consult    Precautions / Restrictions Precautions Precautions: Fall Precaution Comments: L inattention Restrictions Weight Bearing Restrictions: No       Mobility Bed Mobility Overal bed mobility: Needs Assistance Bed  Mobility: Supine to Sit     Supine to sit: Mod assist     General bed mobility comments: Cues to move LUE and facilitate trunk rotation toward R    Transfers Overall transfer level: Needs assistance Equipment used: Rolling walker (2 wheels) Transfers: Sit to/from Stand Sit to Stand: Min assist                 Balance Overall balance assessment: Needs assistance   Sitting balance-Leahy Scale: Fair       Standing balance-Leahy Scale: Poor; AS pt distracted, note increased L lateral lean                             ADL either performed or assessed with clinical judgement   ADL Overall ADL's : Needs assistance/impaired Eating/Feeding: Minimal assistance   Grooming: Minimal assistance   Upper Body Bathing: Minimal assistance   Lower Body Bathing: Maximal assistance;Sit to/from stand   Upper Body Dressing : Moderate assistance   Lower Body Dressing: Moderate assistance;Sit to/from stand   Toilet Transfer: Moderate assistance;Rolling walker (2 wheels)   Toileting- Clothing Manipulation and Hygiene: Maximal assistance       Functional mobility during ADLs: Moderate assistance;Cueing for safety;Cueing for sequencing;Rolling walker (2 wheels) General ADL Comments: L inattention noted during ADL tasks; L hand falling off RW and pt unaware. Able to correct when pt cued to look at L hand    Extremity/Trunk Assessment Upper Extremity Assessment Upper Extremity Assessment: LUE deficits/detail LUE Deficits / Details: moving LUE spontaniously out of synergy pattern; stronger proximally; PROM WFL; able to actively railse arm @ 60 degrees. gross grasp/release; using as a functional assist with cues; unaware of dropping items with L hand LUE Sensation: decreased proprioception LUE Coordination: decreased fine motor;decreased gross motor   Lower Extremity Assessment Lower Extremity Assessment: Defer to PT evaluation  Vision   Vision Assessment?:  Vision impaired- to be further tested in functional context Additional Comments: decreased visual attention; will further assess   Perception Perception Perception: Impaired (decreased aawareness of L; inattention; will further assess; decreased awareness of body in space; L bias at times during mobility)   Praxis Praxis Praxis:  (appears intacted)    Cognition Arousal/Alertness: Awake/alert Behavior During Therapy: Flat affect Overall Cognitive Status: Impaired/Different from baseline Area of Impairment: Attention, Safety/judgement, Awareness, Problem solving                   Current Attention Level: Selective     Safety/Judgement: Decreased awareness of safety, Decreased awareness of deficits Awareness: Emergent Problem Solving: Slow processing Will further assess          Exercises Exercises: Other exercises Other Exercises Other Exercises: PNF diagonals in supine D1/D2 diagonals wiht LUE and AA; VC to maintain visual attention on L hand while holding ball    Shoulder Instructions       General Comments VSS on RA; ambulated @ 20 ft with mod A to bed in hallway; Required VC to turn to sit in bed    Pertinent Vitals/ Pain       Pain Assessment Pain Assessment: Faces Faces Pain Scale: Hurts a little bit Pain Location: head (neck) Pain Descriptors / Indicators: Aching Pain Intervention(s): Limited activity within patient's tolerance  Home Living                                          Prior Functioning/Environment              Frequency  Min 2X/week        Progress Toward Goals  OT Goals(current goals can now be found in the care plan section)  Progress towards OT goals: Progressing toward goals;OT to reassess next treatment  Acute Rehab OT Goals OT Goal Formulation: With patient Time For Goal Achievement: 07/22/21 Potential to Achieve Goals: Good ADL Goals Pt Will Perform Eating: with min assist;sitting Pt Will  Perform Grooming: with min assist;sitting Pt Will Perform Upper Body Bathing: with mod assist;sitting Pt Will Perform Upper Body Dressing: with min assist;sitting Pt Will Transfer to Toilet: squat pivot transfer;bedside commode;with min assist;with +2 assist  Plan Discharge plan remains appropriate    Co-evaluation                 AM-PAC OT "6 Clicks" Daily Activity     Outcome Measure   Help from another person eating meals?: A Little Help from another person taking care of personal grooming?: A Lot Help from another person toileting, which includes using toliet, bedpan, or urinal?: A Lot Help from another person bathing (including washing, rinsing, drying)?: A Lot Help from another person to put on and taking off regular upper body clothing?: A Lot Help from another person to put on and taking off regular lower body clothing?: A Lot 6 Click Score: 13    End of Session Equipment Utilized During Treatment: Gait belt;Rolling walker (2 wheels)  OT Visit Diagnosis: Unsteadiness on feet (R26.81);Muscle weakness (generalized) (M62.81);Hemiplegia and hemiparesis Hemiplegia - Right/Left: Left Hemiplegia - dominant/non-dominant: Non-Dominant Hemiplegia - caused by: Other cerebrovascular disease   Activity Tolerance Patient tolerated treatment well   Patient Left in bed;Other (comment) (transferred to non-ICU in bed in hall)   Nurse Communication Mobility status  Time: 3428-7681 OT Time Calculation (min): 28 min  Charges: OT General Charges $OT Visit: 1 Visit OT Treatments $Self Care/Home Management : 8-22 mins $Neuromuscular Re-education: 8-22 mins  Maurie Boettcher, OT/L   Acute OT Clinical Specialist Acute Rehabilitation Services Pager [phone] Office [phone]   [facility] 07/11/2021, 11:47 AM

## 2021-07-12 ENCOUNTER — Inpatient Hospital Stay (HOSPITAL_COMMUNITY)
Admission: RE | Admit: 2021-07-12 | Discharge: 2021-07-22 | DRG: 092 | Disposition: A | Discharge status: Home / Self Care | Payer: BC Managed Care – PPO | Source: Intra-hospital | Attending: Physical Medicine & Rehabilitation | Admitting: Physical Medicine & Rehabilitation

## 2021-07-12 ENCOUNTER — Other Ambulatory Visit: Payer: Self-pay

## 2021-07-12 ENCOUNTER — Encounter (HOSPITAL_COMMUNITY): Payer: Self-pay | Admitting: Physical Medicine & Rehabilitation

## 2021-07-12 ENCOUNTER — Telehealth: Payer: Self-pay | Admitting: Internal Medicine

## 2021-07-12 DIAGNOSIS — Z8249 Family history of ischemic heart disease and other diseases of the circulatory system: Secondary | ICD-10-CM | POA: Diagnosis not present

## 2021-07-12 DIAGNOSIS — D649 Anemia, unspecified: Secondary | ICD-10-CM | POA: Diagnosis present

## 2021-07-12 DIAGNOSIS — R2689 Other abnormalities of gait and mobility: Principal | ICD-10-CM | POA: Diagnosis present

## 2021-07-12 DIAGNOSIS — Z823 Family history of stroke: Secondary | ICD-10-CM

## 2021-07-12 DIAGNOSIS — Z79899 Other long term (current) drug therapy: Secondary | ICD-10-CM

## 2021-07-12 DIAGNOSIS — E039 Hypothyroidism, unspecified: Secondary | ICD-10-CM | POA: Diagnosis present

## 2021-07-12 DIAGNOSIS — I10 Essential (primary) hypertension: Secondary | ICD-10-CM | POA: Diagnosis present

## 2021-07-12 DIAGNOSIS — Z806 Family history of leukemia: Secondary | ICD-10-CM

## 2021-07-12 DIAGNOSIS — C712 Malignant neoplasm of temporal lobe: Secondary | ICD-10-CM | POA: Diagnosis present

## 2021-07-12 DIAGNOSIS — Z833 Family history of diabetes mellitus: Secondary | ICD-10-CM

## 2021-07-12 DIAGNOSIS — D72829 Elevated white blood cell count, unspecified: Secondary | ICD-10-CM | POA: Diagnosis present

## 2021-07-12 DIAGNOSIS — Z90710 Acquired absence of both cervix and uterus: Secondary | ICD-10-CM | POA: Diagnosis not present

## 2021-07-12 DIAGNOSIS — Z79890 Hormone replacement therapy (postmenopausal): Secondary | ICD-10-CM

## 2021-07-12 DIAGNOSIS — N179 Acute kidney failure, unspecified: Secondary | ICD-10-CM | POA: Diagnosis present

## 2021-07-12 DIAGNOSIS — E785 Hyperlipidemia, unspecified: Secondary | ICD-10-CM | POA: Diagnosis present

## 2021-07-12 DIAGNOSIS — R7989 Other specified abnormal findings of blood chemistry: Secondary | ICD-10-CM | POA: Diagnosis not present

## 2021-07-12 DIAGNOSIS — R600 Localized edema: Secondary | ICD-10-CM | POA: Diagnosis present

## 2021-07-12 DIAGNOSIS — G8194 Hemiplegia, unspecified affecting left nondominant side: Secondary | ICD-10-CM | POA: Diagnosis not present

## 2021-07-12 DIAGNOSIS — C719 Malignant neoplasm of brain, unspecified: Secondary | ICD-10-CM | POA: Diagnosis present

## 2021-07-12 DIAGNOSIS — D496 Neoplasm of unspecified behavior of brain: Secondary | ICD-10-CM | POA: Diagnosis not present

## 2021-07-12 DIAGNOSIS — R4189 Other symptoms and signs involving cognitive functions and awareness: Secondary | ICD-10-CM | POA: Diagnosis present

## 2021-07-12 LAB — CBC
HCT: 32.7 % — ABNORMAL LOW (ref 36.0–46.0)
Hemoglobin: 11.1 g/dL — ABNORMAL LOW (ref 12.0–15.0)
MCH: 30.7 pg (ref 26.0–34.0)
MCHC: 33.9 g/dL (ref 30.0–36.0)
MCV: 90.6 fL (ref 80.0–100.0)
Platelets: 244 10*3/uL (ref 150–400)
RBC: 3.61 MIL/uL — ABNORMAL LOW (ref 3.87–5.11)
RDW: 14.1 % (ref 11.5–15.5)
WBC: 14 10*3/uL — ABNORMAL HIGH (ref 4.0–10.5)
nRBC: 0 % (ref 0.0–0.2)

## 2021-07-12 LAB — CREATININE, SERUM
Creatinine, Ser: 1.18 mg/dL — ABNORMAL HIGH (ref 0.44–1.00)
GFR, Estimated: 50 mL/min — ABNORMAL LOW (ref >60)

## 2021-07-12 MED ORDER — HYDROCHLOROTHIAZIDE 12.5 MG PO TABS
12.5000 mg | ORAL_TABLET | Freq: Every day | ORAL | Status: DC
  Administered 2021-07-13: 12.5 mg via ORAL
  Filled 2021-07-12: qty 1

## 2021-07-12 MED ORDER — DEXAMETHASONE 2 MG PO TABS
2.0000 mg | ORAL_TABLET | Freq: Four times a day (QID) | ORAL | Status: DC
  Administered 2021-07-12 – 2021-07-18 (×23): 2 mg via ORAL
  Filled 2021-07-12 (×24): qty 1

## 2021-07-12 MED ORDER — ONDANSETRON HCL 4 MG/2ML IJ SOLN: 4.0000 mg | INTRAMUSCULAR | Status: DC | PRN

## 2021-07-12 MED ORDER — FAMOTIDINE 20 MG PO TABS
20.0000 mg | ORAL_TABLET | Freq: Every day | ORAL | Status: DC
  Administered 2021-07-13 – 2021-07-22 (×10): 20 mg via ORAL
  Filled 2021-07-12 (×10): qty 1

## 2021-07-12 MED ORDER — DOCUSATE SODIUM 100 MG PO CAPS
100.0000 mg | ORAL_CAPSULE | Freq: Two times a day (BID) | ORAL | Status: DC
  Administered 2021-07-12 – 2021-07-22 (×20): 100 mg via ORAL
  Filled 2021-07-12 (×20): qty 1

## 2021-07-12 MED ORDER — ADULT MULTIVITAMIN W/MINERALS CH
1.0000 | ORAL_TABLET | Freq: Every day | ORAL | Status: DC
  Administered 2021-07-13 – 2021-07-22 (×10): 1 via ORAL
  Filled 2021-07-12 (×10): qty 1

## 2021-07-12 MED ORDER — HEPARIN SODIUM (PORCINE) 5000 UNIT/ML IJ SOLN
5000.0000 [IU] | Freq: Three times a day (TID) | INTRAMUSCULAR | Status: DC
  Administered 2021-07-12 – 2021-07-22 (×29): 5000 [IU] via SUBCUTANEOUS
  Filled 2021-07-12 (×28): qty 1

## 2021-07-12 MED ORDER — HYDROCHLOROTHIAZIDE 25 MG PO TABS: 25.0000 mg | ORAL_TABLET | Freq: Every day | ORAL | Status: DC

## 2021-07-12 MED ORDER — CLINDAMYCIN HCL 300 MG PO CAPS
300.0000 mg | ORAL_CAPSULE | Freq: Three times a day (TID) | ORAL | Status: DC
  Administered 2021-07-12 – 2021-07-21 (×27): 300 mg via ORAL
  Filled 2021-07-12 (×30): qty 1

## 2021-07-12 MED ORDER — ALPRAZOLAM 0.25 MG PO TABS
0.2500 mg | ORAL_TABLET | Freq: Two times a day (BID) | ORAL | Status: DC | PRN
  Administered 2021-07-12 – 2021-07-18 (×6): 0.25 mg via ORAL
  Filled 2021-07-12 (×6): qty 1

## 2021-07-12 MED ORDER — LEVOTHYROXINE SODIUM 50 MCG PO TABS
50.0000 ug | ORAL_TABLET | Freq: Every day | ORAL | Status: DC
  Administered 2021-07-13 – 2021-07-22 (×10): 50 ug via ORAL
  Filled 2021-07-12 (×10): qty 1

## 2021-07-12 MED ORDER — DEXAMETHASONE 4 MG PO TABS
2.0000 mg | ORAL_TABLET | Freq: Four times a day (QID) | ORAL | Status: DC
  Administered 2021-07-12: 2 mg via ORAL
  Filled 2021-07-12: qty 1

## 2021-07-12 MED ORDER — HEPARIN SODIUM (PORCINE) 5000 UNIT/ML IJ SOLN: 5000.0000 [IU] | Freq: Three times a day (TID) | INTRAMUSCULAR | Status: DC

## 2021-07-12 MED ORDER — ONDANSETRON HCL 4 MG PO TABS: 4.0000 mg | ORAL_TABLET | ORAL | Status: DC | PRN

## 2021-07-12 MED ORDER — ACETAMINOPHEN 650 MG RE SUPP: 650.0000 mg | Freq: Four times a day (QID) | RECTAL | Status: DC | PRN

## 2021-07-12 MED ORDER — LEVETIRACETAM 500 MG PO TABS
500.0000 mg | ORAL_TABLET | Freq: Two times a day (BID) | ORAL | Status: DC
  Administered 2021-07-12 – 2021-07-22 (×20): 500 mg via ORAL
  Filled 2021-07-12 (×20): qty 1

## 2021-07-12 MED ORDER — HYDROCODONE-ACETAMINOPHEN 5-325 MG PO TABS
1.0000 | ORAL_TABLET | ORAL | Status: DC | PRN
  Administered 2021-07-12 – 2021-07-20 (×7): 1 via ORAL
  Filled 2021-07-12 (×7): qty 1

## 2021-07-12 MED ORDER — PANTOPRAZOLE SODIUM 40 MG PO TBEC
40.0000 mg | DELAYED_RELEASE_TABLET | Freq: Every day | ORAL | Status: DC
  Administered 2021-07-12 – 2021-07-21 (×10): 40 mg via ORAL
  Filled 2021-07-12 (×10): qty 1

## 2021-07-12 MED ORDER — CLINDAMYCIN HCL 300 MG PO CAPS: 300.0000 mg | ORAL_CAPSULE | Freq: Three times a day (TID) | ORAL | Status: DC

## 2021-07-12 MED ORDER — ACETAMINOPHEN 325 MG PO TABS
650.0000 mg | ORAL_TABLET | Freq: Four times a day (QID) | ORAL | Status: DC | PRN
  Administered 2021-07-20 – 2021-07-21 (×2): 650 mg via ORAL
  Filled 2021-07-12 (×2): qty 2

## 2021-07-12 NOTE — H&P (Addendum)
Physical Medicine and Rehabilitation Admission H&P  CC: Brain tumor s/p craniotomy  HPI: Denise Wright is a 71 year old right-handed female with history of hypertension, normocytic anemia, hyperlipidemia as well as hypothyroidism.  Per chart review patient lives alone.  1 level home 4 steps to enter.  Independent prior to admission and working.  She does have good family support.  Presented 06/29/2021 with altered mental status and complaints of memory and recall issues for approximately 1-1/2 weeks.  She had reported a headache for approximately 2 days.  Denied any trauma or recent fall.  Patient did recently have 5 teeth extracted 06/16/2021 placed on clindamycin.  Cranial CT scan showed poorly visualized 2.3 cm right temporal mass with associated vasogenic edema as well as a 4 mm right to left midline shift.  CT of chest abdomen pelvis showed no findings of primary malignancy or metastatic disease.  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  Central necrosis and mild internal hemorrhage.  There was mass-effect with edema and 5 mm midline shift.  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 Pieter Partridge Dawley.  Maintained on Keppra for seizure prophylaxis as well as Decadron with taper as directed.  She was cleared to begin subcutaneous heparin for DVT prophylaxis 07/08/2021.  Tolerating a regular diet.  Therapy evaluations completed due to patient decreased functional ability/altered mental status was admitted for a comprehensive rehab program. She has slowed speech, but asks good questions about rehab.  Review of Systems  Constitutional:  Negative for chills and fever.  HENT:  Negative for hearing loss.   Eyes:  Positive for blurred vision. Negative for double vision.  Respiratory:  Negative for cough and shortness of breath.   Cardiovascular:  Negative for chest pain,  palpitations and leg swelling.  Gastrointestinal:  Positive for nausea.  Genitourinary:  Negative for dysuria, flank pain and hematuria.  Musculoskeletal:  Positive for myalgias.  Skin:  Negative for rash.  Neurological:  Positive for dizziness and weakness.  Psychiatric/Behavioral:         Memory deficits  All other systems reviewed and are negative. Past Medical History:  Diagnosis Date   Hyperlipidemia    Hypertension    Pneumonia    Thyroid disease    Past Surgical History:  Procedure Laterality Date   ABDOMINAL HYSTERECTOMY  1443   APPLICATION OF CRANIAL NAVIGATION Right 07/06/2021   Procedure: APPLICATION OF CRANIAL NAVIGATION;  Surgeon: Dawley, Theodoro Doing, DO;  Location: [facility];  Service: Neurosurgery;  Laterality: Right;   CRANIOTOMY Right 07/06/2021   Procedure: CRANIOTOMY TUMOR EXCISION;  Surgeon: Karsten Ro, DO;  Location: [facility];  Service: Neurosurgery;  Laterality: Right;   TONSILLECTOMY  1965   TOOTH EXTRACTION     Family History  Problem Relation Age of Onset   Diabetes Mother    Hypertension Mother    Hypertension Father    Leukemia Father    Stroke Father    Cancer Brother    Pneumonia Brother    Social History:  reports that she has never smoked. She has never used smokeless tobacco. She reports that she does not drink alcohol and does not use drugs. Allergies:  Allergies  Allergen Reactions   Fexofenadine Hives   Penicillins Itching and Other (See Comments)    Pulling sensation in body   Medications Prior to Admission  Medication Sig Dispense Refill   b complex vitamins  capsule Take 1 capsule by mouth daily.     chlorhexidine (PERIDEX) 0.12 % solution 15 mLs by Mouth Rinse route 2 (two) times daily.     cholecalciferol (VITAMIN D) 1000 UNITS tablet Take 2,000 Units by mouth daily.     clindamycin (CLEOCIN) 300 MG capsule Take 300 mg by mouth every 8 (eight) hours.     fluticasone (FLONASE) 50 MCG/ACT nasal spray Place 1 spray into both nostrils daily  as needed for allergies. OTC     hydrochlorothiazide (HYDRODIURIL) 25 MG tablet Take 25 mg by mouth daily.     ibuprofen (ADVIL) 400 MG tablet Take 400 mg by mouth every 4 (four) hours as needed for pain.     levothyroxine (SYNTHROID) 50 MCG tablet Take 50 mcg by mouth daily.     Multiple Vitamins-Minerals (MULTIVITAMIN WITH MINERALS) tablet Take 1 tablet by mouth daily.      Drug Regimen Review Drug regimen was reviewed and remains appropriate with no significant issues identified  Home: Home Living Family/patient expects to be discharged to:: Private residence Living Arrangements: Alone Available Help at Discharge: Family, Available 24 hours/day Type of Home: House Home Access: Stairs to enter Technical brewer of Steps: 4 Entrance Stairs-Rails: Right Home Layout: One level Bathroom Shower/Tub: Multimedia programmer: Standard Bathroom Accessibility: Yes Home Equipment: None   Functional History: Prior Function Prior Level of Function : Independent/Modified Independent, Working/employed, Driving   Functional Status:  Mobility: Bed Mobility Overal bed mobility: Needs Assistance Bed Mobility: Supine to Sit Supine to sit: Mod assist Sit to supine: Max assist, +2 for physical assistance, +2 for safety/equipment General bed mobility comments: Cues to move LUE adn facilitate trunk rotation toward R Transfers Overall transfer level: Needs assistance Equipment used: Rolling walker (2 wheels) Transfers: Sit to/from Stand Sit to Stand: Min assist General transfer comment: assist from EOB to stand to walker for L UE placement and some lifting help, from toilet in bathroom min A for balance Ambulation/Gait Ambulation/Gait assistance: Mod assist, Min assist Gait Distance (Feet): 12 Feet (x 2) Assistive device: Rolling walker (2 wheels) Gait Pattern/deviations: Step-through pattern, Decreased stride length, Step-to pattern, Wide base of support, Trunk flexed General  Gait Details: mod progressing to min A as pt more upright and taking steps with L LE; initially more shuffling, flexed and A for L side awareness. walked to bathroom then to recliner   ADL: ADL Overall ADL's : Needs assistance/impaired Eating/Feeding: Minimal assistance Grooming: Minimal assistance Upper Body Bathing: Minimal assistance Lower Body Bathing: Maximal assistance, Sit to/from stand Lower Body Bathing Details (indicate cue type and reason): able to move R leg Upper Body Dressing : Moderate assistance Lower Body Dressing: Moderate assistance, Sit to/from stand Toilet Transfer: Moderate assistance, Rolling walker (2 wheels) Toileting- Clothing Manipulation and Hygiene: Maximal assistance Functional mobility during ADLs: Moderate assistance, Cueing for safety, Cueing for sequencing, Rolling walker (2 wheels) General ADL Comments: L inattention noted during ADL tasks; L hand falling off RW and pt unaware. Able to correct when pt cued to look at L hand   Cognition: Cognition Overall Cognitive Status: Impaired/Different from baseline Orientation Level: Oriented X4 Cognition Arousal/Alertness: Awake/alert Behavior During Therapy: Flat affect Overall Cognitive Status: Impaired/Different from baseline Area of Impairment: Attention, Safety/judgement, Awareness, Problem solving Current Attention Level: Selective Safety/Judgement: Decreased awareness of safety, Decreased awareness of deficits Awareness: Emergent Problem Solving: Slow processing General Comments: decreased L side awareness, trying to alternate her attention while walking to ask daughter to tell someone on the  phone to bring her squeeze ball and asking for her phone to read her devotionals while in the bathroom; needed cues for focus on task and L UE placement in standing and sitting Difficult to assess due to: Level of arousal    Physical Exam: Blood pressure 128/69, pulse 73, temperature 98.7 F (37.1 C),  temperature source Oral, resp. rate 20, height 5' 2.01" (1.575 m), weight 95.3 kg. Gen: no distress, normal appearing HEENT: Craniotomy site clean and dry Cardio: Reg rate Chest: normal effort, normal rate of breathing Abd: soft, non-distended Ext: no edema Psych: pleasant, normal affect Skin: intact  Neurological:     Comments: Patient is alert.  Sitting up in bed.  Makes eye contact with examiner.  Provides name and age.  Follows simple commands. Ride sided strength intact. Left sided strength 4/5. Sensation intact.   Results for orders placed or performed during the hospital encounter of 07/12/21 (from the past 48 hour(s))  CBC     Status: Abnormal   Collection Time: 07/12/21  2:04 PM  Result Value Ref Range   WBC 14.0 (H) 4.0 - 10.5 K/uL   RBC 3.61 (L) 3.87 - 5.11 MIL/uL   Hemoglobin 11.1 (L) 12.0 - 15.0 g/dL   HCT 32.7 (L) 36.0 - 46.0 %   MCV 90.6 80.0 - 100.0 fL   MCH 30.7 26.0 - 34.0 pg   MCHC 33.9 30.0 - 36.0 g/dL   RDW 14.1 11.5 - 15.5 %   Platelets 244 150 - 400 K/uL   nRBC 0.0 0.0 - 0.2 %    Comment: Performed at [facility], [place] [address], [place], [place] [address]  Creatinine, serum     Status: Abnormal   Collection Time: 07/12/21  2:04 PM  Result Value Ref Range   Creatinine, Ser 1.18 (H) 0.44 - 1.00 mg/dL   GFR, Estimated 50 (L) >60 mL/min    Comment: (NOTE) Calculated using the CKD-EPI Creatinine Equation (2021) Performed at [facility] [address] [place], [place] [address]    No results found.     Medical Problem List and Plan: 1. 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  Slow Decadron taper  -patient may shower but incision must be covered.   -ELOS/Goals: 10-14 days S  Admit to CIR 2.  Antithrombotics: -DVT/anticoagulation:  Pharmaceutical: Heparin initiated 07/08/2021  -antiplatelet therapy: N/A 3. Pain Management: Hydrocodone as  needed 4. Mood: Xanax 0.25 mg twice daily as needed  -antipsychotic agents: N/A 5. Neuropsych: This patient is capable of making decisions on her own behalf. 6. Skin/Wound Care: Routine skin checks 7. Fluids/Electrolytes/Nutrition: Routine in and outs with follow-up chemistries 8.  Seizure prophylaxis.  Keppra 500 mg twice daily 9.  Hypertension.  Decrease HCTZ to 12.5mg  due to AKI, well controlled.  Monitor with increased mobility 10.  Hypothyroidism.  Synthroid 11.  Normocytic anemia.  Follow-up CBC 12.  Recent multiple teeth extraction 06/16/2021.  Continue Clindamycin 300 mg every 8 hours 13. AKI: creatinine 1.18 on 2/1, was 1 on 1/27. Decrease HCTZ to 12.5mg  and repeat tomorrow.   I have personally performed a face to face diagnostic evaluation, including, but not limited to relevant history and physical exam findings, of this patient and developed relevant assessment and plan.  Additionally, I have reviewed and concur with the physician assistant's documentation above.  Lavon Paganini Angiulli, PA-C   Izora Ribas, MD 07/12/2021

## 2021-07-12 NOTE — Progress Notes (Signed)
Patient admitted to floor from 4N Progressive via bed by staff with family by side. Patient alert and oriented and denies distress or pain. Patient oriented to floor, safety and call bell system. Bell in lowest position and call bell within reach

## 2021-07-12 NOTE — Discharge Summary (Signed)
Physician Discharge Summary  Patient ID: Denise Wright MRN: [id number] DOB/AGE: February 06, 1951 71 y.o.  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  A CT head was obtained and revealed a large right temporal tumor.  It was recommended that the patient undergo resection of this tumor.  She was taken to the operating room on 07/06/2021, where the patient underwent a right fronto temporal craniotomy for resection of the supratentorial intra-axial tumor.  The patient tolerated the procedure well and was taken to the recovery room and then to the ICU in stable condition.  The hospital course was routine.  There were no complications.  The patient's craniotomy incision remained clean dry and intact.  Patient had left-sided weakness that continued to improve throughout her admission.  She had no new complaints of neurological deficits.  The patient remained afebrile with stable vital signs and tolerated a regular diet.  The patient continued to work with therapies and increase her activities as she was able tolerate.  PT/OT recommended the patient be discharged for admission into inpatient rehab.  At this time, the patient is stable and ready for admission into inpatient rehab.   Consults: None  Significant Diagnostic Studies: radiology: CT scan: head, chest/abdomen /pelvis . MRI: brain  Treatments: surgery:  Right frontotemporal craniotomy, stereotactic, for resection of supratentorial intraaxial tumor Intraoperative use of stereotaxy, [facility] Intraoperative use of microscope for  microdissection  Discharge Exam: Blood pressure 123/62, pulse 83, temperature 98 F (36.7 C), temperature source Oral, resp. rate 18, height 5\' 2"  (1.575 m), weight 93.5 kg, SpO2 94 %. Physical Exam: Patient is awake and A/O X 4.  She is in NAD and VSS. Doing well. Speech is fluent and appropriate. MAEW. RUE / RLE 5/5. LUE 4-/5, LLE 4/5 Sensation to light touch is intact. PERLA, EOMI. CNs grossly intact. Periorbital swelling improving. Incision is well approximated with no drainage, erythema, or edema.  Disposition: Discharge disposition: [facility] Not Defined     Continue: Keppra 500 mg b.i.d. Dexamethasone 2 mg q.6h   Allergies as of 07/12/2021       Reactions   Fexofenadine Hives   Penicillins Itching, Other (See Comments)   Pulling sensation in body        Medication List     TAKE these medications    b complex vitamins capsule Take 1 capsule by mouth daily.   chlorhexidine 0.12 % solution Commonly known as: PERIDEX 15 mLs by Mouth Rinse route 2 (two) times daily.   cholecalciferol 1000 units tablet Commonly known as: VITAMIN D Take 2,000 Units by mouth daily.   clindamycin 300 MG capsule Commonly known as: CLEOCIN Take 300 mg by mouth every 8 (eight) hours.   fluticasone 50 MCG/ACT nasal spray Commonly known as: FLONASE Place 1 spray into both nostrils daily as needed for allergies. OTC   hydrochlorothiazide 25 MG tablet Commonly known as: HYDRODIURIL Take 25 mg by mouth daily.   ibuprofen 400 MG tablet Commonly known as: ADVIL Take 400 mg by mouth every 4 (four) hours as needed for pain.   levothyroxine 50 MCG tablet Commonly known as: SYNTHROID  Take 50 mcg by mouth daily.   multivitamin with minerals tablet Take 1 tablet by mouth daily.         Signed: Scarlette Slice, NP-C 07/12/2021, 12:41 PM

## 2021-07-12 NOTE — Progress Notes (Addendum)
Subjective: Patient reports that her throat "scratchiness" is beginning to improve. No acute events overnight.   Objective: Vital signs in last 24 hours: Temp:  [97.5 F (36.4 C)-98.9 F (37.2 C)] 97.5 F (36.4 C) (02/01 0742) Pulse Rate:  [67-84] 76 (02/01 0742) Resp:  [15-28] 19 (02/01 0742) BP: (116-136)/(55-69) 126/69 (02/01 0742) SpO2:  [94 %-98 %] 96 % (02/01 0742)  Intake/Output from previous day: 01/31 0701 - 02/01 0700 In: -  Out: 250 [Urine:250] Intake/Output this shift: Total I/O In: -  Out: 500 [Urine:500]  Physical Exam: Patient is awake and A/O X 4.  She is in NAD and VSS. Doing well. Speech is fluent and appropriate. MAEW. RUE / RLE 5/5. LUE 4-/5, LLE 4/5 Sensation to light touch is intact. PERLA, EOMI. CNs grossly intact. Periorbital swelling improving. Incision is well approximated with no drainage, erythema, or edema.    Lab Results: No results for input(s): WBC, HGB, HCT, PLT in the last 72 hours. BMET No results for input(s): NA, K, CL, CO2, GLUCOSE, BUN, CREATININE, CALCIUM in the last 72 hours.  Studies/Results: No results found.  Assessment/Plan: Patient is post-op day 6 s/p right frontotemporal craniotomy for resection of supratentorial tumor. She is continuing to make progress. Neuro  exam continuing to slowly improve. PT/OT recommending acute inpatient rehab. Order for rehab consult has been placed. Awaiting insurance authorization.      - PT/OT - slow dex taper - Keppra    LOS: 10 days     Marvis Moeller, DNP, NP-C 07/12/2021, 8:05 AM  Addendum: Pt s/e Agree with above   Wean decadron Awaiting rehab auth L side improving appropriately Incision c/d/I   Thank you for allowing me to participate in this patient's care.  Please do not hesitate to call with questions or concerns.   Elwin Sleight, [facility] Cell: [phone]

## 2021-07-12 NOTE — Progress Notes (Signed)
Pt discharged to CIR and transferred to 5C02. All belongings packed and family at bedside during transfer.   Justice Rocher, RN

## 2021-07-12 NOTE — Progress Notes (Signed)
PMR Admission Coordinator Pre-Admission Assessment ° °Patient: Denise Wright is an 70 y.o., female °MRN: [id number] °DOB: 05/14/1951 °Height: 5' 2" (157.5 cm) °Weight: 93.5 kg ° °Insurance Information °HMO:     PPO: yes     PCP:      IPA:      80/20:      OTHER:  °PRIMARY: [employer] [place]      Policy#: [id number]      Subscriber: pt °CM Name: faxed approval      Phone#: n/a     Fax#: [phone] °Pre-Cert#: [id number] auth for CIR via fax with updates due to fax listed above on 2/14.       Employer:  °Benefits:  Phone #: [phone]     Name:  °Eff. Date: 11/09/20     Deduct: $1500 (met $210.20)      Out of Pocket Max: $6000 (met $318.54)      Life Max: n/a °CIR: 80%      SNF: 80% °Outpatient:     Co-Pay: $50/visit °Home Health: 80%      Co-Ins: 20% °DME: 80%     Co-Ins: 20% °Providers: preferred network  °SECONDARY: Medicare A      Policy#: [id number]     Phone#:  ° °Financial Counselor:       Phone#:  ° °The “Data Collection Information Summary” for patients in Inpatient Rehabilitation Facilities with attached “Privacy Act Statement-Health Care Records” was provided and verbally reviewed with: Family ° °Emergency Contact Information °Contact Information   ° ° Name Relation Home Work Mobile  ° [place],[name] Daughter [phone]    ° °  ° ° °Current Medical History  °Patient Admitting Diagnosis: SAH  ° °History of Present Illness:  Denise Wright is a 70-year-old right-handed female with history of hypertension, normocytic anemia, hyperlipidemia as well as hypothyroidism.  Presented 06/29/2021 with altered mental status and complaints of memory and recall issues for approximately 1-1/2 weeks.  She had reported a headache for approximately 2 days.  Denied any trauma or recent fall.  Patient did recently have 5 teeth extracted 06/16/2021 placed on clindamycin.  Cranial CT scan showed poorly visualized 2.3 cm right temporal mass with associated vasogenic edema as well as a 4 mm right to left midline shift.  CT of chest  abdomen pelvis showed no findings of primary malignancy or metastatic disease.  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  Central necrosis and mild internal hemorrhage.  There was mass-effect with edema and 5 mm midline shift.  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 Troy Dawley.  Maintained on Keppra for seizure prophylaxis as well as Decadron with taper as directed.  She was cleared to begin subcutaneous heparin for DVT prophylaxis 07/08/2021.  Tolerating a regular diet.  Therapy evaluations completed due to patient decreased functional ability/altered mental status was recommended for a comprehensive rehab program.  °  ° °Patient's medical record from [facility] has been reviewed by the rehabilitation admission coordinator and physician. ° °Past Medical History  °Past Medical History:  °Diagnosis Date  ° Hyperlipidemia   ° Hypertension   ° Pneumonia   ° Thyroid disease   ° ° °Has the patient had major surgery during 100 days prior to admission? Yes ° °Family History   °family history includes Cancer in her brother; Diabetes in her mother; Hypertension in her father and mother; Leukemia in her father; Pneumonia   in her brother; Stroke in her father. ° °Current Medications ° °Current Facility-Administered Medications:  °  0.9 %  sodium chloride infusion, 250 mL, Intravenous, PRN, Thomas, Jonathan G, MD °  0.9 %  sodium chloride infusion, , Intravenous, Continuous, Thomas, Jonathan G, MD, New Bag at 07/06/21 1403 °  acetaminophen (TYLENOL) tablet 650 mg, 650 mg, Oral, Q6H PRN, 650 mg at 07/08/21 1652 **OR** acetaminophen (TYLENOL) suppository 650 mg, 650 mg, Rectal, Q6H PRN, Thomas, Jonathan G, MD °  ALPRAZolam (XANAX) tablet 0.25 mg, 0.25 mg, Oral, BID PRN, Thomas, Jonathan G, MD, 0.25 mg at 07/10/21 2254 °  Chlorhexidine Gluconate Cloth 2 % PADS 6 each, 6 each, Topical, Q0600,  Thomas, Jonathan G, MD, 6 each at 07/12/21 0825 °  dexamethasone (DECADRON) tablet 2 mg, 2 mg, Oral, Q6H, Dawley, Troy C, DO °  docusate sodium (COLACE) capsule 100 mg, 100 mg, Oral, BID, Thomas, Jonathan G, MD, 100 mg at 07/12/21 0836 °  famotidine (PEPCID) tablet 20 mg, 20 mg, Oral, Daily, Thomas, Jonathan G, MD, 20 mg at 07/12/21 0836 °  heparin injection 5,000 Units, 5,000 Units, Subcutaneous, Q8H, Thomas, Jonathan G, MD, 5,000 Units at 07/12/21 0519 °  hydrochlorothiazide (HYDRODIURIL) tablet 25 mg, 25 mg, Oral, Daily, Thomas, Jonathan G, MD, 25 mg at 07/12/21 0836 °  HYDROcodone-acetaminophen (NORCO/VICODIN) 5-325 MG per tablet 1 tablet, 1 tablet, Oral, Q4H PRN, Thomas, Jonathan G, MD, 1 tablet at 07/10/21 2254 °  labetalol (NORMODYNE) injection 10-40 mg, 10-40 mg, Intravenous, Q10 min PRN, Thomas, Jonathan G, MD, 20 mg at 07/08/21 0001 °  levETIRAcetam (KEPPRA) tablet 500 mg, 500 mg, Oral, BID, Thomas, Jonathan G, MD, 500 mg at 07/12/21 0836 °  levothyroxine (SYNTHROID) tablet 50 mcg, 50 mcg, Oral, QAC breakfast, Thomas, Jonathan G, MD, 50 mcg at 07/12/21 0518 °  menthol-cetylpyridinium (CEPACOL) lozenge 3 mg, 1 lozenge, Oral, PRN, McDaniel, Joshua L, NP, 3 mg at 07/11/21 2307 °  morphine 2 MG/ML injection 1-2 mg, 1-2 mg, Intravenous, Q2H PRN, Thomas, Jonathan G, MD, 2 mg at 07/08/21 0003 °  multivitamin with minerals tablet 1 tablet, 1 tablet, Oral, Daily, Thomas, Jonathan G, MD, 1 tablet at 07/12/21 0836 °  ondansetron (ZOFRAN) tablet 4 mg, 4 mg, Oral, Q4H PRN **OR** ondansetron (ZOFRAN) injection 4 mg, 4 mg, Intravenous, Q4H PRN, Thomas, Jonathan G, MD °  pantoprazole (PROTONIX) EC tablet 40 mg, 40 mg, Oral, QHS, Thomas, Jonathan G, MD, 40 mg at 07/11/21 2128 °  phenol (CHLORASEPTIC) mouth spray 1 spray, 1 spray, Mouth/Throat, PRN, McDaniel, Joshua L, NP, 1 spray at 07/12/21 0746 °  promethazine (PHENERGAN) tablet 12.5-25 mg, 12.5-25 mg, Oral, Q4H PRN, Thomas, Jonathan G, MD °  sodium chloride flush (NS)  0.9 % injection 3 mL, 3 mL, Intravenous, Q12H, Thomas, Jonathan G, MD, 3 mL at 07/12/21 0825 °  sodium chloride flush (NS) 0.9 % injection 3 mL, 3 mL, Intravenous, PRN, Thomas, Jonathan G, MD, 3 mL at 07/07/21 1000 ° °Patients Current Diet:  °Diet Order   ° °       °  Diet regular Room service appropriate? Yes with Assist; Fluid consistency: Thin  Diet effective now       °  ° °  °  ° °  ° ° °Precautions / Restrictions °Precautions °Precautions: Fall °Precaution Comments: L inattention °Restrictions °Weight Bearing Restrictions: No  ° °Has the patient had 2 or more falls or a fall with injury in the past year? Yes ° °Prior Activity Level °Community (5-7x/wk):   indep prior to admit, working FT, driving, no DME used ° °Prior Functional Level °Self Care: Did the patient need help bathing, dressing, using the toilet or eating? Independent ° °Indoor Mobility: Did the patient need assistance with walking from room to room (with or without device)? Independent ° °Stairs: Did the patient need assistance with internal or external stairs (with or without device)? Independent ° °Functional Cognition: Did the patient need help planning regular tasks such as shopping or remembering to take medications? Independent ° °Patient Information °Are you of Hispanic, Latino/a,or Spanish origin?: A. No, not of Hispanic, Latino/a, or Spanish origin °What is your race?: B. Black or African American °Do you need or want an interpreter to communicate with a doctor or health care staff?: 0. No ° °Patient's Response To:  °Health Literacy and Transportation °Is the patient able to respond to health literacy and transportation needs?: Yes °Health Literacy - How often do you need to have someone help you when you read instructions, pamphlets, or other written material from your doctor or pharmacy?: Never °In the past 12 months, has lack of transportation kept you from medical appointments or from getting medications?: No °In the past 12 months, has  lack of transportation kept you from meetings, work, or from getting things needed for daily living?: No ° °Home Assistive Devices / Equipment °Home Equipment: None ° °Prior Device Use: Indicate devices/aids used by the patient prior to current illness, exacerbation or injury? None of the above ° °Current Functional Level °Cognition ° Overall Cognitive Status: Impaired/Different from baseline °Difficult to assess due to: Level of arousal °Current Attention Level: Selective °Orientation Level: Oriented X4 °Safety/Judgement: Decreased awareness of safety, Decreased awareness of deficits °General Comments: decreased L side awareness, trying to alternate her attention while walking to ask daughter to tell someone on the phone to bring her squeeze ball and asking for her phone to read her devotionals while in the bathroom; needed cues for focus on task and L UE placement in standing and sitting °   °Extremity Assessment °(includes Sensation/Coordination) ° Upper Extremity Assessment: LUE deficits/detail °LUE Deficits / Details: moving LUE spontaniously out of synergy pattern; stronger proximally; PROM WFL; able to actively railse arm @ 60 degrees. gross grasp/release; using as a functional assist with cues; unaware of dropping items with L hand °LUE Sensation: decreased proprioception °LUE Coordination: decreased fine motor, decreased gross motor  °Lower Extremity Assessment: Defer to PT evaluation °LLE Deficits / Details: Grossly 2+/5 °LLE Sensation: decreased proprioception, decreased light touch °LLE Coordination: decreased fine motor, decreased gross motor  °  °ADLs ° Overall ADL's : Needs assistance/impaired °Eating/Feeding: Minimal assistance °Grooming: Minimal assistance °Upper Body Bathing: Minimal assistance °Lower Body Bathing: Maximal assistance, Sit to/from stand °Lower Body Bathing Details (indicate cue type and reason): able to move R leg °Upper Body Dressing : Moderate assistance °Lower Body Dressing:  Moderate assistance, Sit to/from stand °Toilet Transfer: Moderate assistance, Rolling walker (2 wheels) °Toileting- Clothing Manipulation and Hygiene: Maximal assistance °Functional mobility during ADLs: Moderate assistance, Cueing for safety, Cueing for sequencing, Rolling walker (2 wheels) °General ADL Comments: L inattention noted during ADL tasks; L hand falling off RW and pt unaware. Able to correct when pt cued to look at L hand  °  °Mobility ° Overal bed mobility: Needs Assistance °Bed Mobility: Supine to Sit °Supine to sit: Mod assist °Sit to supine: Max assist, +2 for physical assistance, +2 for safety/equipment °General bed mobility comments: Cues to move LUE adn facilitate trunk rotation toward   R  °  °Transfers ° Overall transfer level: Needs assistance °Equipment used: Rolling walker (2 wheels) °Transfers: Sit to/from Stand °Sit to Stand: Min assist °General transfer comment: assist from EOB to stand to walker for L UE placement and some lifting help, from toilet in bathroom min A for balance  °  °Ambulation / Gait / Stairs / Wheelchair Mobility ° Ambulation/Gait °Ambulation/Gait assistance: Mod assist, Min assist °Gait Distance (Feet): 12 Feet (x 2) °Assistive device: Rolling walker (2 wheels) °Gait Pattern/deviations: Step-through pattern, Decreased stride length, Step-to pattern, Wide base of support, Trunk flexed °General Gait Details: mod progressing to min A as pt more upright and taking steps with L LE; initially more shuffling, flexed and A for L side awareness. walked to bathroom then to recliner  °  °Posture / Balance Dynamic Sitting Balance °Sitting balance - Comments: on toilet in bathroom only occasional UE support °Balance °Overall balance assessment: Needs assistance °Sitting-balance support: Feet supported °Sitting balance-Leahy Scale: Fair °Sitting balance - Comments: on toilet in bathroom only occasional UE support °Postural control: Left lateral lean, Posterior lean °Standing balance  support: Bilateral upper extremity supported, Reliant on assistive device for balance °Standing balance-Leahy Scale: Poor  °  °Special needs/care consideration Skin surgical incision  ° °Previous Home Environment (from acute therapy documentation) °Living Arrangements: Alone °Available Help at Discharge: Family, Available 24 hours/day °Type of Home: House °Home Layout: One level °Home Access: Stairs to enter °Entrance Stairs-Rails: Right °Entrance Stairs-Number of Steps: 4 °Bathroom Shower/Tub: Walk-in shower °Bathroom Toilet: Standard °Bathroom Accessibility: Yes ° °Discharge Living Setting °Plans for Discharge Living Setting: Patient's home (family members to stay with her) °Type of Home at Discharge: House °Discharge Home Layout: One level °Discharge Home Access: Stairs to enter °Entrance Stairs-Rails: Right °Entrance Stairs-Number of Steps: 4 °Discharge Bathroom Shower/Tub: Walk-in shower °Discharge Bathroom Toilet: Standard °Discharge Bathroom Accessibility: Yes °How Accessible: Accessible via walker °Does the patient have any problems obtaining your medications?: No ° °Social/Family/Support Systems °Anticipated Caregiver: daughter, [name], and other family members °Anticipated Caregiver's Contact Information: [name] [phone] °Ability/Limitations of Caregiver: n/a °Caregiver Availability: 24/7 °Discharge Plan Discussed with Primary Caregiver: Yes °Is Caregiver In Agreement with Plan?: Yes °Does Caregiver/Family have Issues with Lodging/Transportation while Pt is in Rehab?: No ° °Goals °Patient/Family Goal for Rehab: PT/OT/SLP supervision °Expected length of stay: 12-14 days °Pt/Family Agrees to Admission and willing to participate: Yes °Program Orientation Provided & Reviewed with Pt/Caregiver Including Roles  & Responsibilities: Yes ° Barriers to Discharge: Insurance for SNF coverage ° °Decrease burden of Care through IP rehab admission: n/a ° °Possible need for SNF placement upon discharge: Not anticipated.   Pt progressing well and with good family support.   ° °Patient Condition: I have reviewed medical records from [facility], spoken with  TOC team , and daughter. I discussed via phone for inpatient rehabilitation assessment.  Patient will benefit from ongoing PT, OT, and SLP, can actively participate in 3 hours of therapy a day 5 days of the week, and can make measurable gains during the admission.  Patient will also benefit from the coordinated team approach during an Inpatient Acute Rehabilitation admission.  The patient will receive intensive therapy as well as Rehabilitation physician, nursing, social worker, and care management interventions.  Due to safety, skin/wound care, medication administration, pain management, and patient education the patient requires 24 hour a day rehabilitation nursing.  The patient is currently min to mod with mobility and mod to max assist with basic ADLs.  Discharge setting and   therapy post discharge at home with home health is anticipated.  Patient has agreed to participate in the Acute Inpatient Rehabilitation Program and will admit today. ° °Preadmission Screen Completed By:  General Wearing E Lezlie Ritchey, PT, DPT 07/12/2021 12:02 PM °______________________________________________________________________   °Discussed status with Dr. Raulkar on 07/12/21  at 12:13 PM  and received approval for admission today. ° °Admission Coordinator:  Jalayne Ganesh E Hudsyn Barich, PT, DPT time 12:13 PM /Date 07/12/21   ° °Assessment/Plan: °Diagnosis: s/p craniotomy for brain mass °Does the need for close, 24 hr/day Medical supervision in concert with the patient's rehab needs make it unreasonable for this patient to be served in a less intensive setting? Yes °Co-Morbidities requiring supervision/potential complications: obesity BMI 37.7, normocytic anemia, essential hypertension, hypothyroidism, hypokalemia °Due to bladder management, bowel management, safety, skin/wound care, disease management, medication  administration, pain management, and patient education, does the patient require 24 hr/day rehab nursing? Yes °Does the patient require coordinated care of a physician, rehab nurse, PT, OT, and SLP to address physical and functional deficits in the context of the above medical diagnosis(es)? Yes °Addressing deficits in the following areas: balance, endurance, locomotion, strength, transferring, bowel/bladder control, bathing, dressing, feeding, grooming, toileting, speech, and psychosocial support °Can the patient actively participate in an intensive therapy program of at least 3 hrs of therapy 5 days a week? Yes °The potential for patient to make measurable gains while on inpatient rehab is excellent °Anticipated functional outcomes upon discharge from inpatient rehab: supervision PT, supervision OT, supervision SLP °Estimated rehab length of stay to reach the above functional goals is: 10-14 days °Anticipated discharge destination: Home °10. Overall Rehab/Functional Prognosis: excellent ° ° °MD Signature: °Krutika Raulkar, MD °

## 2021-07-12 NOTE — Evaluation (Signed)
Occupational Therapy Assessment and Plan  Patient Details  Name: Denise Wright MRN: [id number] Date of Birth: 1950/09/25  OT Diagnosis: hemiplegia affecting non-dominant side and muscle weakness (generalized) Rehab Potential: Rehab Potential (ACUTE ONLY): Excellent ELOS: 2 weeks   Today's Date: 07/13/2021 OT Individual Time: 7353-2992 OT Individual Time Calculation (min): 56 min     Hospital Problem: Principal Problem:   Brain tumor [facility])   Past Medical History:  Past Medical History:  Diagnosis Date   Hyperlipidemia    Hypertension    Pneumonia    Thyroid disease    Past Surgical History:  Past Surgical History:  Procedure Laterality Date   ABDOMINAL HYSTERECTOMY  4268   APPLICATION OF CRANIAL NAVIGATION Right 07/06/2021   Procedure: APPLICATION OF CRANIAL NAVIGATION;  Surgeon: Karsten Ro, DO;  Location: [place];  Service: Neurosurgery;  Laterality: Right;   CRANIOTOMY Right 07/06/2021   Procedure: CRANIOTOMY TUMOR EXCISION;  Surgeon: Karsten Ro, DO;  Location: [place];  Service: Neurosurgery;  Laterality: Right;   TONSILLECTOMY  1965   TOOTH EXTRACTION      Assessment & Plan Clinical Impression: Patient is a 71 y.o. year old female with recent admission to the hospital on 06/29/21 for fatigue, memory issues, headaches, weakness. Transferred to [facility]. 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 PMH: HTN, hypothyroidism, normocytic anemia.  Patient transferred to CIR on 07/12/2021 .    Patient currently requires mod with basic self-care skills secondary to muscle weakness, impaired timing and sequencing, abnormal tone, unbalanced muscle activation, and decreased coordination, decreased attention to left and decreased motor planning, decreased attention and decreased memory, and decreased sitting balance, decreased standing balance, decreased postural control, hemiplegia, and decreased  balance strategies.  Prior to hospitalization, patient could complete BADL and iADL with independent .  Patient will benefit from skilled intervention to increase independence with basic self-care skills prior to discharge home with care partner.  Anticipate patient will require 24 hour supervision and follow up outpatient.  OT - End of Session Endurance Deficit: Yes Endurance Deficit Description: Rest breaks within BADL tasks, very slow OT Assessment Rehab Potential (ACUTE ONLY): Excellent OT Patient demonstrates impairments in the following area(s): Balance;Cognition;Endurance;Motor;Edema;Pain;Perception;Safety;Sensory;Skin Integrity OT Basic ADL's Functional Problem(s): Eating;Grooming;Bathing;Toileting;Dressing OT Transfers Functional Problem(s): Toilet;Tub/Shower OT Additional Impairment(s): Fuctional Use of Upper Extremity OT Plan OT Intensity: Minimum of 1-2 x/day, 45 to 90 minutes OT Frequency: 5 out of 7 days OT Duration/Estimated Length of Stay: 2 weeks OT Treatment/Interventions: Balance/vestibular training;Cognitive remediation/compensation;Community reintegration;Discharge planning;Disease mangement/prevention;Functional electrical stimulation;Functional mobility training;DME/adaptive equipment instruction;Patient/family education;Psychosocial support;Neuromuscular re-education;Pain management;Self Care/advanced ADL retraining;Skin care/wound managment;Splinting/orthotics;Therapeutic Activities;Therapeutic Exercise;UE/LE Strength taining/ROM;UE/LE Coordination activities;Visual/perceptual remediation/compensation;Wheelchair propulsion/positioning OT Self Feeding Anticipated Outcome(s): Mod I OT Basic Self-Care Anticipated Outcome(s): Supervision OT Toileting Anticipated Outcome(s): Supervision OT Bathroom Transfers Anticipated Outcome(s): Supervision OT Recommendation Recommendations for Other Services: Therapeutic Recreation consult Therapeutic Recreation Interventions:  Outing/community reintergration Patient destination: Home Follow Up Recommendations: Home health OT;Outpatient OT;24 hour supervision/assistance (HHOT vs OPOT) Equipment Recommended: To be determined Equipment Details: TBD   OT Evaluation Precautions/Restrictions  Precautions Precautions: Fall Precaution Comments: L inattention Restrictions Weight Bearing Restrictions: No Pain Pain Assessment Pain Scale: 0-10 Pain Score: 0-No pain Home Living/Prior Functioning Home Living Family/patient expects to be discharged to:: Private residence Living Arrangements: Alone Available Help at Discharge: Family, Available 24 hours/day Type of Home: House Home Access: Stairs to enter [employer] of Steps: 6 Entrance Stairs-Rails: Right Home Layout: One  level Bathroom Shower/Tub: Engineer, mining: Yes  Lives With: Alone IADL History Homemaking Responsibilities: Yes Current License: Yes Leisure and Hobbies: Enjoys going out to eat with her girlfriends Prior Function Level of Independence: Independent with homemaking with ambulation, Independent with basic ADLs  Able to Take Stairs?: Yes Driving: Yes Vocation: Full time employment Vision Baseline Vision/History: 1 Wears glasses Ability to See in Adequate Light: 0 Adequate Vision Assessment?: No apparent visual deficits Perception  Perception: Impaired Inattention/Neglect: Does not attend to left visual field Praxis Praxis: Intact Cognition Overall Cognitive Status: Impaired/Different from baseline Arousal/Alertness: Awake/alert Orientation Level: Person;Place;Situation Person: Oriented Place: Oriented Situation: Oriented Year: 2023 Month: January Day of Week: Incorrect Memory: Impaired Memory Impairment: Retrieval deficit;Decreased short term memory Decreased Short Term Memory: Verbal complex;Functional complex Immediate Memory Recall: Bed;Blue;Sock Memory Recall  Sock: Not able to recall Memory Recall Blue: Without Cue Memory Recall Bed: Without Cue Attention: Sustained Sustained Attention: Impaired Sustained Attention Impairment: Verbal basic;Functional basic Awareness: Impaired Awareness Impairment: Emergent impairment Problem Solving: Impaired Problem Solving Impairment: Verbal complex;Functional complex Executive Function: Organizing;Self Monitoring (clock drawing) Organizing: Impaired Organizing Impairment: Verbal basic;Functional basic Self Monitoring: Impaired Self Monitoring Impairment: Verbal basic;Functional basic Safety/Judgment: Appears intact Sensation Sensation Light Touch: Appears Intact Coordination Gross Motor Movements are Fluid and Coordinated: Yes Fine Motor Movements are Fluid and Coordinated: Yes Motor  Motor Motor: Hemiplegia  Trunk/Postural Assessment  Cervical Assessment Cervical Assessment:  (forward head) Thoracic Assessment Thoracic Assessment:  (rounded shoulders) Lumbar Assessment Lumbar Assessment:  (posterior pelvic tilt) Postural Control Postural Control: Deficits on evaluation (delayed and inadequate)  Balance Balance Balance Assessed: Yes Static Sitting Balance Static Sitting - Balance Support: Feet supported Static Sitting - Level of Assistance: 5: Stand by assistance Dynamic Sitting Balance Dynamic Sitting - Balance Support: Feet supported Dynamic Sitting - Level of Assistance: 4: Min assist Static Standing Balance Static Standing - Balance Support: Bilateral upper extremity supported;During functional activity Static Standing - Level of Assistance: 4: Min assist Dynamic Standing Balance Dynamic Standing - Balance Support: Bilateral upper extremity supported;During functional activity Dynamic Standing - Level of Assistance: 4: Min assist Extremity/Trunk Assessment RUE Assessment RUE Assessment: Within Functional Limits LUE Assessment LUE Assessment: Exceptions to [facility] LUE Body System:  Neuro Brunstrum levels for arm and hand: Arm;Hand Brunstrum level for arm: Stage IV Movement is deviating from synergy Brunstrum level for hand: Stage IV Movements deviating from synergies LUE AROM (degrees) Left Shoulder Flexion: 120 Degrees LUE Strength LUE Overall Strength Comments: 3-/5 overall  Care Tool Care Tool Self Care Eating   Eating Assist Level: Set up assist    Oral Care    Oral Care Assist Level: Minimal Assistance - Patient > 75%    Bathing   Body parts bathed by patient: Left arm;Chest;Abdomen;Front perineal area;Right upper leg;Left upper leg;Face Body parts bathed by helper: Left lower leg;Right lower leg;Buttocks;Right arm   Assist Level: Moderate Assistance - Patient 50 - 74%    Upper Body Dressing(including orthotics)   What is the patient wearing?: Pull over shirt   Assist Level: Moderate Assistance - Patient 50 - 74%    Lower Body Dressing (excluding footwear)   What is the patient wearing?: Pants Assist for lower body dressing: Maximal Assistance - Patient 25 - 49%    Putting on/Taking off footwear   What is the patient wearing?: Non-skid slipper socks Assist for footwear: Maximal Assistance - Patient 25 - 49%       Care Tool Toileting Toileting activity  Assist for toileting: Moderate Assistance - Patient 50 - 74%     Care Tool Bed Mobility Roll left and right activity   Roll left and right assist level: Moderate Assistance - Patient 50 - 74%    Sit to lying activity   Sit to lying assist level: Moderate Assistance - Patient 50 - 74%    Lying to sitting on side of bed activity   Lying to sitting on side of bed assist level: the ability to move from lying on the back to sitting on the side of the bed with no back support.: Moderate Assistance - Patient 50 - 74%     Care Tool Transfers Sit to stand transfer   Sit to stand assist level: Moderate Assistance - Patient 50 - 74%    Chair/bed transfer   Chair/bed transfer assist level:  Minimal Assistance - Patient > 75%     Toilet transfer   Assist Level: Minimal Assistance - Patient > 75%     Care Tool Cognition  Expression of Ideas and Wants Expression of Ideas and Wants: 3. Some difficulty - exhibits some difficulty with expressing needs and ideas (e.g, some words or finishing thoughts) or speech is not clear  Understanding Verbal and Non-Verbal Content Understanding Verbal and Non-Verbal Content: 3. Usually understands - understands most conversations, but misses some part/intent of message. Requires cues at times to understand   Memory/Recall Ability Memory/Recall Ability : Current season;That he or she is in a hospital/hospital unit   Refer to Care Plan for [place] 1 OT Short Term Goal 1 (Week 1): Patient will complete LB dressing with min A OT Short Term Goal 2 (Week 1): Patient will tolerate standing at the sink for 3 minutes in preparation for BADL task. OT Short Term Goal 3 (Week 1): Patient will use L UE to apply deodorant with CGA.  Recommendations for other services: Therapeutic Recreation  Outing/community reintegration   Skilled Therapeutic Intervention Pt greeted semi-reclined in bed and agreeable to OT eval and treat. OT eval completed addressing rehab process, OT purpose, POC, ELOS, and goals. Pt preformed functional BADL tasks and transfers as described in detail below. Pt demonstrated L hemiplegia, but able to use L UE at a gross assist level. Noted balance deficits and decreased safety awareness during BADLs. Pt with ambulatory during session with RW and min A, but VERY slow gait speed and cues needed for larger steps with L LE. Pt with successful BM and continent void of bladder. Pt left seated in recliner at end of session with chair alarm on, call bell in reach, and family present.   ADL ADL Eating: Set up Grooming: Setup Upper Body Bathing: Minimal assistance Lower Body Bathing: Maximal assistance Upper Body  Dressing: Moderate assistance Lower Body Dressing: Maximal assistance Toileting: Moderate assistance Toilet Transfer: Minimal assistance Mobility  Bed Mobility Bed Mobility: Supine to Sit;Sit to Supine Supine to Sit: Moderate Assistance - Patient 50-74% Sit to Supine: Moderate Assistance - Patient 50-74% Transfers Sit to Stand: Minimal Assistance - Patient > 75% Stand to Sit: Minimal Assistance - Patient > 75%   Discharge Criteria: Patient will be discharged from OT if patient refuses treatment 3 consecutive times without medical reason, if treatment goals not met, if there is a change in medical status, if patient makes no progress towards goals or if patient is discharged from hospital.  The above assessment, treatment plan, treatment alternatives and goals were discussed and mutually agreed upon:  by patient and by family  Valma Cava 07/13/2021, 1:01 PM

## 2021-07-12 NOTE — Progress Notes (Signed)
Inpatient Rehab Admissions Coordinator:    I have insurance approval and a bed available for pt to admit to CIR today. Dr. Reatha Armour in agreement.  Will let pt/family and TOC team know.   Shann Medal, PT, DPT Admissions Coordinator [phone] 07/12/21  11:34 AM

## 2021-07-12 NOTE — PMR Pre-admission (Signed)
PMR Admission Coordinator Pre-Admission Assessment  Patient: Denise Wright is an 71 y.o., female MRN: [id number] DOB: Mar 04, 1951 Height: 5' 2" (157.5 cm) Weight: 93.5 kg  Insurance Information HMO:     PPO: yes     PCP:      IPA:      80/20:      OTHER:  PRIMARY: [employer] [place]      Policy#: [id number]      Subscriber: pt CM Name: faxed approval      Phone#: n/a     Fax#: [phone] Pre-Cert#: [id number] auth for CIR via fax with updates due to fax listed above on 2/14.       Employer:  Benefits:  Phone #: [phone]     Name:  Eff. Date: 11/09/20     Deduct: $1500 (met $210.20)      Out of Pocket Max: $6000 (met $318.54)      Life Max: n/a CIR: 80%      SNF: 80% Outpatient:     Co-Pay: $50/visit Home Health: 80%      Co-Ins: 20% DME: 80%     Co-Ins: 20% Providers: preferred network  SECONDARY: Medicare A      Policy#: [id number]     Phone#:   Development worker, community:       Phone#:   The Actuary for patients in Inpatient Rehabilitation Facilities with attached Privacy Act [place] Records was provided and verbally reviewed with: Family  Emergency Contact Information Contact Information     Name Relation Home Work [place] Daughter [phone]         Current Medical History  Patient Admitting Diagnosis: SAH   History of Present Illness:  Denise Wright is a 71 year old right-handed female with history of hypertension, normocytic anemia, hyperlipidemia as well as hypothyroidism.  Presented 06/29/2021 with altered mental status and complaints of memory and recall issues for approximately 1-1/2 weeks.  She had reported a headache for approximately 2 days.  Denied any trauma or recent fall.  Patient did recently have 5 teeth extracted 06/16/2021 placed on clindamycin.  Cranial CT scan showed poorly visualized 2.3 cm right temporal mass with associated vasogenic edema as well as a 4 mm right to left midline shift.  CT of chest  abdomen pelvis showed no findings of primary malignancy or metastatic disease.  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  Central necrosis and mild internal hemorrhage.  There was mass-effect with edema and 5 mm midline shift.  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 Pieter Partridge Dawley.  Maintained on Keppra for seizure prophylaxis as well as Decadron with taper as directed.  She was cleared to begin subcutaneous heparin for DVT prophylaxis 07/08/2021.  Tolerating a regular diet.  Therapy evaluations completed due to patient decreased functional ability/altered mental status was recommended for a comprehensive rehab program.     Patient's medical record from [facility] has been reviewed by the rehabilitation admission coordinator and physician.  Past Medical History  Past Medical History:  Diagnosis Date   Hyperlipidemia    Hypertension    Pneumonia    Thyroid disease     Has the patient had major surgery during 100 days prior to admission? Yes  Family History   family history includes Cancer in her brother; Diabetes in her mother; Hypertension in her father and mother; Leukemia in her father; Pneumonia  in her brother; Stroke in her father.  Current Medications  Current Facility-Administered Medications:    0.9 %  sodium chloride infusion, 250 mL, Intravenous, PRN, Vallarie Mare, MD   0.9 %  sodium chloride infusion, , Intravenous, Continuous, Vallarie Mare, MD, New Bag at 07/06/21 1403   acetaminophen (TYLENOL) tablet 650 mg, 650 mg, Oral, Q6H PRN, 650 mg at 07/08/21 1652 **OR** acetaminophen (TYLENOL) suppository 650 mg, 650 mg, Rectal, Q6H PRN, Vallarie Mare, MD   ALPRAZolam Duanne Moron) tablet 0.25 mg, 0.25 mg, Oral, BID PRN, Vallarie Mare, MD, 0.25 mg at 07/10/21 2254   Chlorhexidine Gluconate Cloth 2 % PADS 6 each, 6 each, Topical, Q0600,  Vallarie Mare, MD, 6 each at 07/12/21 0825   dexamethasone (DECADRON) tablet 2 mg, 2 mg, Oral, Q6H, Dawley, Troy C, DO   docusate sodium (COLACE) capsule 100 mg, 100 mg, Oral, BID, Vallarie Mare, MD, 100 mg at 07/12/21 0836   famotidine (PEPCID) tablet 20 mg, 20 mg, Oral, Daily, Vallarie Mare, MD, 20 mg at 07/12/21 0836   heparin injection 5,000 Units, 5,000 Units, Subcutaneous, Q8H, Vallarie Mare, MD, 5,000 Units at 07/12/21 4401   hydrochlorothiazide (HYDRODIURIL) tablet 25 mg, 25 mg, Oral, Daily, Vallarie Mare, MD, 25 mg at 07/12/21 0836   HYDROcodone-acetaminophen (NORCO/VICODIN) 5-325 MG per tablet 1 tablet, 1 tablet, Oral, Q4H PRN, Vallarie Mare, MD, 1 tablet at 07/10/21 2254   labetalol (NORMODYNE) injection 10-40 mg, 10-40 mg, Intravenous, Q10 min PRN, Vallarie Mare, MD, 20 mg at 07/08/21 0001   levETIRAcetam (KEPPRA) tablet 500 mg, 500 mg, Oral, BID, Vallarie Mare, MD, 500 mg at 07/12/21 0272   levothyroxine (SYNTHROID) tablet 50 mcg, 50 mcg, Oral, QAC breakfast, Vallarie Mare, MD, 50 mcg at 07/12/21 0518   menthol-cetylpyridinium (CEPACOL) lozenge 3 mg, 1 lozenge, Oral, PRN, Fenton Malling L, NP, 3 mg at 07/11/21 2307   morphine 2 MG/ML injection 1-2 mg, 1-2 mg, Intravenous, Q2H PRN, Vallarie Mare, MD, 2 mg at 07/08/21 0003   multivitamin with minerals tablet 1 tablet, 1 tablet, Oral, Daily, Vallarie Mare, MD, 1 tablet at 07/12/21 0836   ondansetron (ZOFRAN) tablet 4 mg, 4 mg, Oral, Q4H PRN **OR** ondansetron (ZOFRAN) injection 4 mg, 4 mg, Intravenous, Q4H PRN, Vallarie Mare, MD   pantoprazole (PROTONIX) EC tablet 40 mg, 40 mg, Oral, QHS, Vallarie Mare, MD, 40 mg at 07/11/21 2128   phenol (CHLORASEPTIC) mouth spray 1 spray, 1 spray, Mouth/Throat, PRN, Marvis Moeller, NP, 1 spray at 07/12/21 0746   promethazine (PHENERGAN) tablet 12.5-25 mg, 12.5-25 mg, Oral, Q4H PRN, Vallarie Mare, MD   sodium chloride flush (NS)  0.9 % injection 3 mL, 3 mL, Intravenous, Q12H, Vallarie Mare, MD, 3 mL at 07/12/21 0825   sodium chloride flush (NS) 0.9 % injection 3 mL, 3 mL, Intravenous, PRN, Vallarie Mare, MD, 3 mL at 07/07/21 1000  Patients Current Diet:  Diet Order             Diet regular Room service appropriate? Yes with Assist; Fluid consistency: Thin  Diet effective now                   Precautions / Restrictions Precautions Precautions: Fall Precaution Comments: L inattention Restrictions Weight Bearing Restrictions: No   Has the patient had 2 or more falls or a fall with injury in the past year? Yes  Prior Activity Level Community (5-7x/wk):  indep prior to admit, working FT, driving, no DME used  Prior Functional Level Self Care: Did the patient need help bathing, dressing, using the toilet or eating? Independent  Indoor Mobility: Did the patient need assistance with walking from room to room (with or without device)? Independent  Stairs: Did the patient need assistance with internal or external stairs (with or without device)? Independent  Functional Cognition: Did the patient need help planning regular tasks such as shopping or remembering to take medications? Independent  Patient Information Are you of Hispanic, Latino/a,or Spanish origin?: A. No, not of Hispanic, Latino/a, or Spanish origin What is your race?: B. Black or African American Do you need or want an interpreter to communicate with a doctor or health care staff?: 0. No  Patient's Response To:  Health Literacy and Transportation Is the patient able to respond to health literacy and transportation needs?: Yes Health Literacy - How often do you need to have someone help you when you read instructions, pamphlets, or other written material from your doctor or pharmacy?: Never In the past 12 months, has lack of transportation kept you from medical appointments or from getting medications?: No In the past 12 months, has  lack of transportation kept you from meetings, work, or from getting things needed for daily living?: No  Home Assistive Devices / Equipment Home Equipment: None  Prior Device Use: Indicate devices/aids used by the patient prior to current illness, exacerbation or injury? None of the above  Current Functional Level Cognition  Overall Cognitive Status: Impaired/Different from baseline Difficult to assess due to: Level of arousal Current Attention Level: Selective Orientation Level: Oriented X4 Safety/Judgement: Decreased awareness of safety, Decreased awareness of deficits General Comments: decreased L side awareness, trying to alternate her attention while walking to ask daughter to tell someone on the phone to bring her squeeze ball and asking for her phone to read her devotionals while in the bathroom; needed cues for focus on task and L UE placement in standing and sitting    Extremity Assessment (includes Sensation/Coordination)  Upper Extremity Assessment: LUE deficits/detail LUE Deficits / Details: moving LUE spontaniously out of synergy pattern; stronger proximally; PROM WFL; able to actively railse arm @ 60 degrees. gross grasp/release; using as a functional assist with cues; unaware of dropping items with L hand LUE Sensation: decreased proprioception LUE Coordination: decreased fine motor, decreased gross motor  Lower Extremity Assessment: Defer to PT evaluation LLE Deficits / Details: Grossly 2+/5 LLE Sensation: decreased proprioception, decreased light touch LLE Coordination: decreased fine motor, decreased gross motor    ADLs  Overall ADL's : Needs assistance/impaired Eating/Feeding: Minimal assistance Grooming: Minimal assistance Upper Body Bathing: Minimal assistance Lower Body Bathing: Maximal assistance, Sit to/from stand Lower Body Bathing Details (indicate cue type and reason): able to move R leg Upper Body Dressing : Moderate assistance Lower Body Dressing:  Moderate assistance, Sit to/from stand Toilet Transfer: Moderate assistance, Rolling walker (2 wheels) Toileting- Clothing Manipulation and Hygiene: Maximal assistance Functional mobility during ADLs: Moderate assistance, Cueing for safety, Cueing for sequencing, Rolling walker (2 wheels) General ADL Comments: L inattention noted during ADL tasks; L hand falling off RW and pt unaware. Able to correct when pt cued to look at L hand    Mobility  Overal bed mobility: Needs Assistance Bed Mobility: Supine to Sit Supine to sit: Mod assist Sit to supine: Max assist, +2 for physical assistance, +2 for safety/equipment General bed mobility comments: Cues to move LUE adn facilitate trunk rotation toward  R    Transfers  Overall transfer level: Needs assistance Equipment used: Rolling walker (2 wheels) Transfers: Sit to/from Stand Sit to Stand: Min assist General transfer comment: assist from EOB to stand to walker for L UE placement and some lifting help, from toilet in bathroom min A for balance    Ambulation / Gait / Stairs / Wheelchair Mobility  Ambulation/Gait Ambulation/Gait assistance: Mod assist, Min assist Gait Distance (Feet): 12 Feet (x 2) Assistive device: Rolling walker (2 wheels) Gait Pattern/deviations: Step-through pattern, Decreased stride length, Step-to pattern, Wide base of support, Trunk flexed General Gait Details: mod progressing to min A as pt more upright and taking steps with L LE; initially more shuffling, flexed and A for L side awareness. walked to bathroom then to recliner    Posture / Balance Dynamic Sitting Balance Sitting balance - Comments: on toilet in bathroom only occasional UE support Balance Overall balance assessment: Needs assistance Sitting-balance support: Feet supported Sitting balance-Leahy Scale: Fair Sitting balance - Comments: on toilet in bathroom only occasional UE support Postural control: Left lateral lean, Posterior lean Standing balance  support: Bilateral upper extremity supported, Reliant on assistive device for balance Standing balance-Leahy Scale: Poor    Special needs/care consideration Skin surgical incision   Previous Home Environment (from acute therapy documentation) Living Arrangements: Alone Available Help at Discharge: Family, Available 24 hours/day Type of Home: House Home Layout: One level Home Access: Stairs to enter Entrance Stairs-Rails: Right Entrance Stairs-Number of Steps: 4 Bathroom Shower/Tub: Multimedia programmer: Standard Bathroom Accessibility: Yes  Discharge Living Setting Plans for Discharge Living Setting: Patient's home (family members to stay with her) Type of Home at Discharge: House Discharge Home Layout: One level Discharge Home Access: Stairs to enter Entrance Stairs-Rails: Right Entrance Stairs-Number of Steps: 4 Discharge Bathroom Shower/Tub: Walk-in shower Discharge Bathroom Toilet: Standard Discharge Bathroom Accessibility: Yes How Accessible: Accessible via walker Does the patient have any problems obtaining your medications?: No  Social/Family/Support Systems Anticipated Caregiver: daughter, [name], and other family members Anticipated Caregiver's Contact Information: [name] [phone] Ability/Limitations of Caregiver: n/a Caregiver Availability: 24/7 Discharge Plan Discussed with Primary Caregiver: Yes Is Caregiver In Agreement with Plan?: Yes Does Caregiver/Family have Issues with Lodging/Transportation while Pt is in Rehab?: No  Goals Patient/Family Goal for Rehab: PT/OT/SLP supervision Expected length of stay: 12-14 days Pt/Family Agrees to Admission and willing to participate: Yes Program Orientation Provided & Reviewed with Pt/Caregiver Including Roles  & Responsibilities: Yes  Barriers to Discharge: Insurance for SNF coverage  Decrease burden of Care through IP rehab admission: n/a  Possible need for SNF placement upon discharge: Not anticipated.   Pt progressing well and with good family support.    Patient Condition: I have reviewed medical records from [facility], spoken with  [facility] team , and daughter. I discussed via phone for inpatient rehabilitation assessment.  Patient will benefit from ongoing PT, OT, and SLP, can actively participate in 3 hours of therapy a day 5 days of the week, and can make measurable gains during the admission.  Patient will also benefit from the coordinated team approach during an Inpatient Acute Rehabilitation admission.  The patient will receive intensive therapy as well as Rehabilitation physician, nursing, social worker, and care management interventions.  Due to safety, skin/wound care, medication administration, pain management, and patient education the patient requires 24 hour a day rehabilitation nursing.  The patient is currently min to mod with mobility and mod to max assist with basic ADLs.  Discharge setting and  therapy post discharge at home with home health is anticipated.  Patient has agreed to participate in the Acute Inpatient Rehabilitation Program and will admit today.  Preadmission Screen Completed By:  Michel Santee, PT, DPT 07/12/2021 12:02 PM ______________________________________________________________________   Discussed status with Dr. Ranell Patrick on 07/12/21  at 12:13 PM  and received approval for admission today.  Admission Coordinator:  Michel Santee, PT, DPT time 12:13 PM Sudie Grumbling 07/12/21    Assessment/Plan: Diagnosis: s/p craniotomy for brain mass Does the need for close, 24 hr/day Medical supervision in concert with the patient's rehab needs make it unreasonable for this patient to be served in a less intensive setting? Yes Co-Morbidities requiring supervision/potential complications: obesity BMI 37.7, normocytic anemia, essential hypertension, hypothyroidism, hypokalemia Due to bladder management, bowel management, safety, skin/wound care, disease management, medication  administration, pain management, and patient education, does the patient require 24 hr/day rehab nursing? Yes Does the patient require coordinated care of a physician, rehab nurse, PT, OT, and SLP to address physical and functional deficits in the context of the above medical diagnosis(es)? Yes Addressing deficits in the following areas: balance, endurance, locomotion, strength, transferring, bowel/bladder control, bathing, dressing, feeding, grooming, toileting, speech, and psychosocial support Can the patient actively participate in an intensive therapy program of at least 3 hrs of therapy 5 days a week? Yes The potential for patient to make measurable gains while on inpatient rehab is excellent Anticipated functional outcomes upon discharge from inpatient rehab: supervision PT, supervision OT, supervision SLP Estimated rehab length of stay to reach the above functional goals is: 10-14 days Anticipated discharge destination: Home 10. Overall Rehab/Functional Prognosis: excellent   MD Signature: Leeroy Cha, MD

## 2021-07-12 NOTE — H&P (Incomplete)
Physical Medicine and Rehabilitation Admission H&P    Chief Complaint  Patient presents with   Fatigue  : HPI: Denise Wright is a 71 year old right-handed female with history of hypertension, normocytic anemia, hyperlipidemia as well as hypothyroidism.  Per chart review patient lives alone.  1 level home 4 steps to enter.  Independent prior to admission and working.  She does have good family support.  Presented 06/29/2021 with altered mental status and complaints of memory and recall issues for approximately 1-1/2 weeks.  She had reported a headache for approximately 2 days.  Denied any trauma or recent fall.  Patient did recently have 5 teeth extracted 06/16/2021 placed on clindamycin.  Cranial CT scan showed poorly visualized 2.3 cm right temporal mass with associated vasogenic edema as well as a 4 mm right to left midline shift.  CT of chest abdomen pelvis showed no findings of primary malignancy or metastatic disease.  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  Central necrosis and mild internal hemorrhage.  There was mass-effect with edema and 5 mm midline shift.  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 Pieter Partridge Dawley.  Maintained on Keppra for seizure prophylaxis as well as Decadron with taper as directed.  She was cleared to begin subcutaneous heparin for DVT prophylaxis 07/08/2021.  Tolerating a regular diet.  Therapy evaluations completed due to patient decreased functional ability/altered mental status was admitted for a comprehensive rehab program.  Review of Systems  Constitutional:  Negative for chills and fever.  HENT:  Negative for hearing loss.   Eyes:  Positive for blurred vision. Negative for double vision.  Respiratory:  Negative for cough and shortness of breath.   Cardiovascular:  Negative for chest pain, palpitations and leg swelling.   Gastrointestinal:  Positive for nausea.  Genitourinary:  Negative for dysuria, flank pain and hematuria.  Musculoskeletal:  Positive for myalgias.  Skin:  Negative for rash.  Neurological:  Positive for dizziness and weakness.  Psychiatric/Behavioral:         Memory deficits  All other systems reviewed and are negative. Past Medical History:  Diagnosis Date   Hyperlipidemia    Hypertension    Pneumonia    Thyroid disease    Past Surgical History:  Procedure Laterality Date   ABDOMINAL HYSTERECTOMY  4431   APPLICATION OF CRANIAL NAVIGATION Right 07/06/2021   Procedure: APPLICATION OF CRANIAL NAVIGATION;  Surgeon: Dawley, Theodoro Doing, DO;  Location: [facility];  Service: Neurosurgery;  Laterality: Right;   CRANIOTOMY Right 07/06/2021   Procedure: CRANIOTOMY TUMOR EXCISION;  Surgeon: Karsten Ro, DO;  Location: [facility];  Service: Neurosurgery;  Laterality: Right;   TONSILLECTOMY  1965   TOOTH EXTRACTION     Family History  Problem Relation Age of Onset   Diabetes Mother    Hypertension Mother    Hypertension Father    Leukemia Father    Stroke Father    Cancer Brother    Pneumonia Brother    Social History:  reports that she has never smoked. She has never used smokeless tobacco. She reports that she does not drink alcohol and does not use drugs. Allergies:  Allergies  Allergen Reactions   Fexofenadine Hives   Penicillins Itching and Other (See Comments)    Pulling sensation in body   Medications Prior to Admission  Medication Sig Dispense Refill   b complex vitamins capsule Take 1  capsule by mouth daily.     chlorhexidine (PERIDEX) 0.12 % solution 15 mLs by Mouth Rinse route 2 (two) times daily.     cholecalciferol (VITAMIN D) 1000 UNITS tablet Take 2,000 Units by mouth daily.     clindamycin (CLEOCIN) 300 MG capsule Take 300 mg by mouth every 8 (eight) hours.     fluticasone (FLONASE) 50 MCG/ACT nasal spray Place 1 spray into both nostrils daily as needed for allergies. OTC      hydrochlorothiazide (HYDRODIURIL) 25 MG tablet Take 25 mg by mouth daily.     ibuprofen (ADVIL) 400 MG tablet Take 400 mg by mouth every 4 (four) hours as needed for pain.     levothyroxine (SYNTHROID) 50 MCG tablet Take 50 mcg by mouth daily.     Multiple Vitamins-Minerals (MULTIVITAMIN WITH MINERALS) tablet Take 1 tablet by mouth daily.      Drug Regimen Review Drug regimen was reviewed and remains appropriate with no significant issues identified  Home: Home Living Family/patient expects to be discharged to:: Private residence Living Arrangements: Alone Available Help at Discharge: Family, Available 24 hours/day Type of Home: House Home Access: Stairs to enter Technical brewer of Steps: 4 Entrance Stairs-Rails: Right Home Layout: One level Bathroom Shower/Tub: Multimedia programmer: Standard Bathroom Accessibility: Yes Home Equipment: None   Functional History: Prior Function Prior Level of Function : Independent/Modified Independent, Working/employed, Driving  Functional Status:  Mobility: Bed Mobility Overal bed mobility: Needs Assistance Bed Mobility: Supine to Sit Supine to sit: Mod assist Sit to supine: Max assist, +2 for physical assistance, +2 for safety/equipment General bed mobility comments: Cues to move LUE adn facilitate trunk rotation toward R Transfers Overall transfer level: Needs assistance Equipment used: Rolling walker (2 wheels) Transfers: Sit to/from Stand Sit to Stand: Min assist General transfer comment: assist from EOB to stand to walker for L UE placement and some lifting help, from toilet in bathroom min A for balance Ambulation/Gait Ambulation/Gait assistance: Mod assist, Min assist Gait Distance (Feet): 12 Feet (x 2) Assistive device: Rolling walker (2 wheels) Gait Pattern/deviations: Step-through pattern, Decreased stride length, Step-to pattern, Wide base of support, Trunk flexed General Gait Details: mod progressing to  min A as pt more upright and taking steps with L LE; initially more shuffling, flexed and A for L side awareness. walked to bathroom then to recliner    ADL: ADL Overall ADL's : Needs assistance/impaired Eating/Feeding: Minimal assistance Grooming: Minimal assistance Upper Body Bathing: Minimal assistance Lower Body Bathing: Maximal assistance, Sit to/from stand Lower Body Bathing Details (indicate cue type and reason): able to move R leg Upper Body Dressing : Moderate assistance Lower Body Dressing: Moderate assistance, Sit to/from stand Toilet Transfer: Moderate assistance, Rolling walker (2 wheels) Toileting- Clothing Manipulation and Hygiene: Maximal assistance Functional mobility during ADLs: Moderate assistance, Cueing for safety, Cueing for sequencing, Rolling walker (2 wheels) General ADL Comments: L inattention noted during ADL tasks; L hand falling off RW and pt unaware. Able to correct when pt cued to look at L hand  Cognition: Cognition Overall Cognitive Status: Impaired/Different from baseline Orientation Level: Oriented X4 Cognition Arousal/Alertness: Awake/alert Behavior During Therapy: Flat affect Overall Cognitive Status: Impaired/Different from baseline Area of Impairment: Attention, Safety/judgement, Awareness, Problem solving Current Attention Level: Selective Safety/Judgement: Decreased awareness of safety, Decreased awareness of deficits Awareness: Emergent Problem Solving: Slow processing General Comments: decreased L side awareness, trying to alternate her attention while walking to ask daughter to tell someone on the phone to bring her  squeeze ball and asking for her phone to read her devotionals while in the bathroom; needed cues for focus on task and L UE placement in standing and sitting Difficult to assess due to: Level of arousal  Physical Exam: Blood pressure (!) 116/55, pulse 77, temperature 98.7 F (37.1 C), temperature source Oral, resp. rate 18,  height 5\' 2"  (1.575 m), weight 93.5 kg, SpO2 94 %. Physical Exam HENT:     Head:     Comments: Craniotomy site clean and dry Neurological:     Comments: Patient is alert.  Sitting up in bed.  Makes eye contact with examiner.  Provides name and age.  Follows simple commands.    No results found for this or any previous visit (from the past 48 hour(s)). No results found.     Medical Problem List and Plan: 1. 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  Slow Decadron taper  -patient may *** shower  -ELOS/Goals: *** 2.  Antithrombotics: -DVT/anticoagulation:  Pharmaceutical: Heparin initiated 07/08/2021  -antiplatelet therapy: N/A 3. Pain Management: Hydrocodone as needed 4. Mood: Xanax 0.25 mg twice daily as needed  -antipsychotic agents: N/A 5. Neuropsych: This patient is capable of making decisions on her own behalf. 6. Skin/Wound Care: Routine skin checks 7. Fluids/Electrolytes/Nutrition: Routine in and outs with follow-up chemistries 8.  Seizure prophylaxis.  Keppra 500 mg twice daily 9.  Hypertension.  HCTZ 25 mg daily.  Monitor with increased mobility 10.  Hypothyroidism.  Synthroid 11.  Normocytic anemia.  Follow-up CBC 12.  Recent multiple teeth extraction 06/16/2021.  Clindamycin 300 mg every 8 hours  Cathlyn Parsons, PA-C 07/12/2021

## 2021-07-12 NOTE — Discharge Instructions (Addendum)
Inpatient Rehab Discharge Instructions  [place] Discharge date and time: No discharge date for patient encounter.   Activities/Precautions/ Functional Status: Activity: activity as tolerated Diet: regular diet Wound Care: Routine skin checks Functional status:  ___ No restrictions     ___ Walk up steps independently ___ 24/7 supervision/assistance   ___ Walk up steps with assistance ___ Intermittent supervision/assistance  ___ Bathe/dress independently ___ Walk with walker     _x__ Bathe/dress with assistance ___ Walk Independently    ___ Shower independently ___ Walk with assistance    ___ Shower with assistance ___ No alcohol     ___ Return to work/school ________   COMMUNITY REFERRALS UPON DISCHARGE:    Outpatient: PT     OT    ST                 Agency:[facility]     [facility]:[phone]              Appointment Date/Time:*Please expect follow-up within 7-10 business days. If you have not received follow-up, be sure to contact the site directly.*  Medical Equipment/Items Ordered:3in1 bedside commode and rolling walker                                                 Agency/Supplier:[facility] [phone]   Special Instructions: No driving smoking or alcohol   My questions have been answered and I understand these instructions. I will adhere to these goals and the provided educational materials after my discharge from the hospital.  Patient/Caregiver Signature _______________________________ Date __________  Clinician Signature _______________________________________ Date __________  Please bring this form and your medication list with you to all your follow-up doctor's appointments.

## 2021-07-12 NOTE — Telephone Encounter (Signed)
Scheduled appt per 1/31 referral. Spoke to pt's daughter who is aware of appt date and time. She requested for appt to be scheduled at the end of the month.

## 2021-07-12 NOTE — Progress Notes (Signed)
Inpatient Rehabilitation Admission Medication Review by a Pharmacist  A complete drug regimen review was completed for this patient to identify any potential clinically significant medication issues.  High Risk Drug Classes Is patient taking? Indication by Medication  Antipsychotic No   Anticoagulant Yes Heparin- VTE prophylaxis  Antibiotic No   Opioid Yes Norco- post-op pain  Antiplatelet No   Hypoglycemics/insulin No   Vasoactive Medication Yes HCTZ- hypertension  Chemotherapy No   Other Yes Protonix, pepcid- GERD Synthroid- hypothyroidism Keppra- seizure prophylaxis Xanax- acute anxiety     Type of Medication Issue Identified Description of Issue Recommendation(s)  Drug Interaction(s) (clinically significant)     Duplicate Therapy     Allergy     No Medication Administration End Date     Incorrect Dose     Additional Drug Therapy Needed     Significant med changes from prior encounter (inform family/care partners about these prior to discharge).    Other  PTA meds: Flonase Restart PTA med as clinically necessary on CIR or at time of discharge    Clinically significant medication issues were identified that warrant physician communication and completion of prescribed/recommended actions by midnight of the next day:  No  Time spent performing this drug regimen review (minutes):  30   Lalaine Overstreet BS, PharmD, BCPS Clinical Pharmacist 07/12/2021 1:52 PM

## 2021-07-13 DIAGNOSIS — R7989 Other specified abnormal findings of blood chemistry: Secondary | ICD-10-CM

## 2021-07-13 DIAGNOSIS — D496 Neoplasm of unspecified behavior of brain: Secondary | ICD-10-CM | POA: Diagnosis not present

## 2021-07-13 DIAGNOSIS — G8194 Hemiplegia, unspecified affecting left nondominant side: Secondary | ICD-10-CM | POA: Diagnosis not present

## 2021-07-13 DIAGNOSIS — I10 Essential (primary) hypertension: Secondary | ICD-10-CM

## 2021-07-13 LAB — CBC WITH DIFFERENTIAL/PLATELET
Abs Immature Granulocytes: 0.34 10*3/uL — ABNORMAL HIGH (ref 0.00–0.07)
Basophils Absolute: 0 10*3/uL (ref 0.0–0.1)
Basophils Relative: 0 %
Eosinophils Absolute: 0 10*3/uL (ref 0.0–0.5)
Eosinophils Relative: 0 %
HCT: 32.9 % — ABNORMAL LOW (ref 36.0–46.0)
Hemoglobin: 11.2 g/dL — ABNORMAL LOW (ref 12.0–15.0)
Immature Granulocytes: 3 %
Lymphocytes Relative: 13 %
Lymphs Abs: 1.7 10*3/uL (ref 0.7–4.0)
MCH: 30.9 pg (ref 26.0–34.0)
MCHC: 34 g/dL (ref 30.0–36.0)
MCV: 90.9 fL (ref 80.0–100.0)
Monocytes Absolute: 1.2 10*3/uL — ABNORMAL HIGH (ref 0.1–1.0)
Monocytes Relative: 9 %
Neutro Abs: 10.4 10*3/uL — ABNORMAL HIGH (ref 1.7–7.7)
Neutrophils Relative %: 75 %
Platelets: 237 10*3/uL (ref 150–400)
RBC: 3.62 MIL/uL — ABNORMAL LOW (ref 3.87–5.11)
RDW: 14 % (ref 11.5–15.5)
WBC: 13.7 10*3/uL — ABNORMAL HIGH (ref 4.0–10.5)
nRBC: 0 % (ref 0.0–0.2)

## 2021-07-13 LAB — COMPREHENSIVE METABOLIC PANEL
ALT: 36 U/L (ref 0–44)
AST: 14 U/L — ABNORMAL LOW (ref 15–41)
Albumin: 2.3 g/dL — ABNORMAL LOW (ref 3.5–5.0)
Alkaline Phosphatase: 74 U/L (ref 38–126)
Anion gap: 9 (ref 5–15)
BUN: 32 mg/dL — ABNORMAL HIGH (ref 8–23)
CO2: 25 mmol/L (ref 22–32)
Calcium: 8.6 mg/dL — ABNORMAL LOW (ref 8.9–10.3)
Chloride: 101 mmol/L (ref 98–111)
Creatinine, Ser: 1.05 mg/dL — ABNORMAL HIGH (ref 0.44–1.00)
GFR, Estimated: 58 mL/min — ABNORMAL LOW (ref >60)
Glucose, Bld: 230 mg/dL — ABNORMAL HIGH (ref 70–99)
Potassium: 4.1 mmol/L (ref 3.5–5.1)
Sodium: 135 mmol/L (ref 135–145)
Total Bilirubin: 0.3 mg/dL (ref 0.3–1.2)
Total Protein: 5.5 g/dL — ABNORMAL LOW (ref 6.5–8.1)

## 2021-07-13 MED ORDER — ENSURE MAX PROTEIN PO LIQD
11.0000 [oz_av] | Freq: Every day | ORAL | Status: DC
  Administered 2021-07-13 – 2021-07-22 (×8): 11 [oz_av] via ORAL
  Filled 2021-07-13 (×10): qty 330

## 2021-07-13 NOTE — Progress Notes (Signed)
PROGRESS NOTE   Subjective/Complaints: Reports some tingling/discomfort along scalp. Otherwise was able to sleep somewhat. Remembered me from when her daughter was a patient.   ROS: Patient denies fever, rash, sore throat, blurred vision, dizziness, nausea, vomiting, diarrhea, cough, shortness of breath or chest pain, joint or back/neck pain,  or mood change.    Objective:   No results found. Recent Labs    07/12/21 1404 07/13/21 0525  WBC 14.0* 13.7*  HGB 11.1* 11.2*  HCT 32.7* 32.9*  PLT 244 237   Recent Labs    07/12/21 1404 07/13/21 0525  NA  --  135  K  --  4.1  CL  --  101  CO2  --  25  GLUCOSE  --  230*  BUN  --  32*  CREATININE 1.18* 1.05*  CALCIUM  --  8.6*    Intake/Output Summary (Last 24 hours) at 07/13/2021 0916 Last data filed at 07/13/2021 0837 Gross per 24 hour  Intake 537 ml  Output --  Net 537 ml        Physical Exam: Vital Signs Blood pressure 121/66, pulse 77, temperature 97.9 F (36.6 C), resp. rate 18, height 5' 2.01" (1.575 m), weight 95.3 kg, SpO2 97 %.  General: Alert and oriented x 3, No apparent distress HEENT: Head is normocephalic, atraumatic, PERRLA, EOMI, sclera anicteric, oral mucosa pink and moist, dentition intact, ext ear canals clear,  Neck: Supple without JVD or lymphadenopathy Heart: Reg rate and rhythm. No murmurs rubs or gallops Chest: CTA bilaterally without wheezes, rales, or rhonchi; no distress Abdomen: Soft, non-tender, non-distended, bowel sounds positive. Extremities: No clubbing, cyanosis, trace LLE edema. Pulses are 2+ Psych: Pt's affect is appropriate. Pt is cooperative Skin: scalp incision cdi with scab,staples, area a little sensitive to touch Neuro:  Alert and oriented x 3. Normal insight and awareness. Intact Memory. Normal language and speech. Cranial nerve exam unremarkable. RUE and RLE 4-5/5. LUE 4-, LLE 3+ to 4-. Sensed LT/PP on all 4's. DTR's  1+ Musculoskeletal: Full ROM, No pain with AROM or PROM in the neck, trunk, or extremities. Posture appropriate     Assessment/Plan: 1. Functional deficits which require 3+ hours per day of interdisciplinary therapy in a comprehensive inpatient rehab setting. Physiatrist is providing close team supervision and 24 hour management of active medical problems listed below. Physiatrist and rehab team continue to assess barriers to discharge/monitor patient progress toward functional and medical goals  Care Tool:  Bathing              Bathing assist       Upper Body Dressing/Undressing Upper body dressing        Upper body assist      Lower Body Dressing/Undressing Lower body dressing      What is the patient wearing?: Hospital gown only, Underwear/pull up     Lower body assist Assist for lower body dressing: Maximal Assistance - Patient 25 - 49%     Toileting Toileting    Toileting assist Assist for toileting: Maximal Assistance - Patient 25 - 49%     Transfers Chair/bed transfer  Transfers assist  Locomotion Ambulation   Ambulation assist              Walk 10 feet activity   Assist           Walk 50 feet activity   Assist           Walk 150 feet activity   Assist           Walk 10 feet on uneven surface  activity   Assist           Wheelchair     Assist               Wheelchair 50 feet with 2 turns activity    Assist            Wheelchair 150 feet activity     Assist          Blood pressure 121/66, pulse 77, temperature 97.9 F (36.6 C), resp. rate 18, height 5' 2.01" (1.575 m), weight 95.3 kg, SpO2 97 %.  Medical Problem List and Plan: 1. 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   -Slow Decadron taper             -patient may shower but incision must be covered.               -ELOS/Goals: 10-14 days S             -Patient is beginning CIR therapies today including PT, OT, and SLP  2.  Antithrombotics: -DVT/anticoagulation:  Pharmaceutical: Heparin initiated 07/08/2021             -antiplatelet therapy: N/A 3. Pain Management: Hydrocodone as needed 4. Mood: Xanax 0.25 mg twice daily as needed             -antipsychotic agents: N/A 5. Neuropsych: This patient is capable of making decisions on her own behalf. 6. Skin/Wound Care: Routine skin checks 7. Fluids/Electrolytes/Nutrition: encourage po  -proteint supp for low albumin  -I personally reviewed the patient's labs today.   8.  Seizure prophylaxis.  Keppra 500 mg twice daily 9.  Hypertension.  Decrease HCTZ to 12.5mg  due to AKI, well controlled.  Monitor with increased mobility 10.  Hypothyroidism.  Synthroid 11.  Normocytic anemia.  hgb stable at 11.2 12.  Recent multiple teeth extraction 06/16/2021.  Continue Clindamycin 300 mg every 8 hours 13. AKI: BUN/Cr up to 32/1.05  -push fluids  -hold HCTZ 14. Leukocytosis: improving, down to 13.7k today  -likely steroid effect    LOS: 1 days A FACE TO FACE EVALUATION WAS PERFORMED  Meredith Staggers 07/13/2021, 9:16 AM

## 2021-07-13 NOTE — Progress Notes (Signed)
Inpatient Rehabilitation  Patient information reviewed and entered into eRehab system by Ruey Storer M. Jonael Paradiso, M.A., CCC/SLP, PPS Coordinator.  Information including medical coding, functional ability and quality indicators will be reviewed and updated through discharge.    

## 2021-07-13 NOTE — Evaluation (Signed)
Speech Language Pathology Assessment and Plan  Patient Details  Name: Denise Wright MRN: [id number] Date of Birth: [birth year]  SLP Diagnosis: Dysarthria;Cognitive Impairments;Other (comment) (voice disorder?)  Rehab Potential: Good ELOS: 2 weeks   Today's Date: 07/13/2021 SLP Individual Time: 1113-1200 SLP Individual Time Calculation (min): 47 min  Hospital Problem: Principal Problem:   Brain tumor [facility])  Past Medical History:  Past Medical History:  Diagnosis Date   Hyperlipidemia    Hypertension    Pneumonia    Thyroid disease    Past Surgical History:  Past Surgical History:  Procedure Laterality Date   ABDOMINAL HYSTERECTOMY  0254   APPLICATION OF CRANIAL NAVIGATION Right 07/06/2021   Procedure: APPLICATION OF CRANIAL NAVIGATION;  Surgeon: Karsten Ro, DO;  Location: [place];  Service: Neurosurgery;  Laterality: Right;   CRANIOTOMY Right 07/06/2021   Procedure: CRANIOTOMY TUMOR EXCISION;  Surgeon: Karsten Ro, DO;  Location: [place];  Service: Neurosurgery;  Laterality: Right;   TONSILLECTOMY  1965   TOOTH EXTRACTION      Assessment / Plan / Recommendation Clinical Impression  Patient is a 71 year old right-handed female with history of hypertension, normocytic anemia, hyperlipidemia as well as hypothyroidism.  Per chart review patient lives alone.  1 level home 4 steps to enter.  Independent prior to admission and working.  She does have good family support.  Presented 06/29/2021 with altered mental status and complaints of memory and recall issues for approximately 1-1/2 weeks.  She had reported a headache for approximately 2 days.  Denied any trauma or recent fall.  Patient did recently have 5 teeth extracted 06/16/2021 placed on clindamycin.  Cranial CT scan showed poorly visualized 2.3 cm right temporal mass with associated vasogenic edema as well as a 4 mm right to left midline shift.  CT of chest abdomen pelvis showed no findings of primary malignancy or metastatic  disease.  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  Central necrosis and mild internal hemorrhage.  There was mass-effect with edema and 5 mm midline shift.  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 Patient transferred to CIR on 07/12/2021 .  Pt presents with mild cognitive impairments as evidenced by SLUMS exam score of 19/30 with primary deficits in verbal fluency, short term recall, mental manipulation and executive function. Pt demonstrates moderate impairment with clock drawing including multiples of numbers, incorrect numbers and incorrect hand placement with poor awareness even with cueing. SLP showing clock to family members present to increase awareness/understanding of pts cognitive changes s/p surgery. Pt is oriented to recent events and current concepts, responds well to cueing throughout. Pt is able to recall home medications and detail bill paying system. Pt was independent PTA and is motivated to maintain independence at discharge. Pt will benefit from skilled ST to increase safety and independence with daily routine. Likely recommend intermittent supervision with cognitive tasks and ongoing ST services HH/OP.   Of note, pt with very weak, breathy voice that family reports began before Christmas. No change after surgery. Recommend ENT consult to determine etiology before treatment for voice. Pt may benefit from RMST to increase respiratory function and speech intelligibility.   Skilled Therapeutic Interventions          Pt participating in [facility] Mental Status Examination (SLUMS) as well as further non-standardized assessments of speech, language and cognition. Please see above.   SLP Assessment  Patient will need  skilled [place] [facility]ology Services during CIR admission    Recommendations  Recommended Consults: Consider ENT evaluation  (significant voice changes before hospitalization) Oral Care Recommendations: Oral care BID Patient destination: Home Follow up Recommendations: Home Health SLP;Outpatient SLP Equipment Recommended: None recommended by SLP    SLP Frequency 3 to 5 out of 7 days   SLP Duration  SLP Intensity  SLP Treatment/Interventions 2 weeks  Minumum of 1-2 x/day, 30 to 90 minutes  Cognitive remediation/compensation;Environmental controls;Internal/external aids;Therapeutic Exercise;Therapeutic Activities;Patient/family education;Functional tasks;Cueing hierarchy    Pain Pain Assessment Pain Scale: 0-10 Pain Score: 0-No pain  Prior Functioning Cognitive/Linguistic Baseline: Within functional limits Type of Home: House  Lives With: Alone Available Help at Discharge: Family;Available 24 hours/day Vocation: Full time employment  SLP Evaluation Cognition Overall Cognitive Status: Impaired/Different from baseline Arousal/Alertness: Awake/alert Orientation Level: Oriented X4 Year: 2023 Month: January Day of Week: Correct Attention: Sustained Sustained Attention: Impaired Sustained Attention Impairment: Verbal basic;Functional basic Memory: Impaired Memory Impairment: Retrieval deficit;Decreased short term memory Decreased Short Term Memory: Verbal complex;Functional complex Awareness: Impaired Awareness Impairment: Emergent impairment Problem Solving: Impaired Problem Solving Impairment: Verbal complex;Functional complex Executive Function: Organizing;Self Monitoring (clock drawing) Organizing: Impaired Organizing Impairment: Verbal basic;Functional basic Self Monitoring: Impaired Self Monitoring Impairment: Verbal basic;Functional basic Safety/Judgment: Appears intact  Comprehension Auditory Comprehension Overall Auditory Comprehension: Appears within functional limits for tasks assessed Yes/No Questions: Within Functional Limits Commands: Within Functional  Limits Expression Expression Primary Mode of Expression: Verbal Verbal Expression Overall Verbal Expression: Appears within functional limits for tasks assessed Oral Motor Oral Motor/Sensory Function Overall Oral Motor/Sensory Function: Within functional limits Motor Speech Overall Motor Speech: Other (comment) (family states voice began to change before christmas) Respiration: Within functional limits Phonation: Hoarse;Low vocal intensity;Breathy Articulation: Within functional limitis Intelligibility: Intelligibility reduced Word: 75-100% accurate Phrase: 50-74% accurate Sentence: 50-74% accurate Conversation: 50-74% accurate Effective Techniques: Increased vocal intensity  Care Tool Care Tool Cognition Ability to hear (with hearing aid or hearing appliances if normally used Ability to hear (with hearing aid or hearing appliances if normally used): 0. Adequate - no difficulty in normal conservation, social interaction, listening to TV   Expression of Ideas and Wants Expression of Ideas and Wants: 3. Some difficulty - exhibits some difficulty with expressing needs and ideas (e.g, some words or finishing thoughts) or speech is not clear   Understanding Verbal and Non-Verbal Content Understanding Verbal and Non-Verbal Content: 3. Usually understands - understands most conversations, but misses some part/intent of message. Requires cues at times to understand  Memory/Recall Ability Memory/Recall Ability : Current season;That he or she is in a hospital/hospital unit   Intelligibility: Intelligibility reduced Word: 75-100% accurate Phrase: 50-74% accurate Sentence: 50-74% accurate Conversation: 50-74% accurate  Short Term Goals: Week 1: SLP Short Term Goal 1 (Week 1): Pt will complete simple medication/money management with min A SLP Short Term Goal 2 (Week 1): Pt will utilize internal/external aids to increase functional recall with Min A SLP Short Term Goal 3 (Week 1): Pt will  solve moderately complex problems with min A cues SLP Short Term Goal 4 (Week 1): Pt will participate in functional alternating attention activities with min A verbal cues SLP Short Term Goal 5 (Week 1): Pt will utilize compensatory strategies for speech intelligibility to increase vocal volume with Supervision A  Refer to Care Plan for Long Term Goals  Recommendations for other services: None   Discharge Criteria: Patient will be discharged from SLP if patient refuses treatment 3 consecutive times without medical reason,  if treatment goals not met, if there is a change in medical status, if patient makes no progress towards goals or if patient is discharged from hospital.  The above assessment, treatment plan, treatment alternatives and goals were discussed and mutually agreed upon: by patient  Dewaine Conger 07/13/2021, 12:09 PM

## 2021-07-13 NOTE — Evaluation (Signed)
Physical Therapy Assessment and Plan  Patient Details  Name: Denise Wright MRN: [id number] Date of Birth: [birth year]  PT Diagnosis: Difficulty walking, Hemiplegia non-dominant, and Muscle weakness Rehab Potential: Good ELOS: 2 weeks   Today's Date: 07/13/2021 PT Individual Time: 1015-1110 PT Individual Time Calculation (min): 55 min    Hospital Problem: Principal Problem:   Brain tumor [facility])   Past Medical History:  Past Medical History:  Diagnosis Date   Hyperlipidemia    Hypertension    Pneumonia    Thyroid disease    Past Surgical History:  Past Surgical History:  Procedure Laterality Date   ABDOMINAL HYSTERECTOMY  4656   APPLICATION OF CRANIAL NAVIGATION Right 07/06/2021   Procedure: APPLICATION OF CRANIAL NAVIGATION;  Surgeon: Karsten Ro, DO;  Location: [place];  Service: Neurosurgery;  Laterality: Right;   CRANIOTOMY Right 07/06/2021   Procedure: CRANIOTOMY TUMOR EXCISION;  Surgeon: Karsten Ro, DO;  Location: [place];  Service: Neurosurgery;  Laterality: Right;   TONSILLECTOMY  1965   TOOTH EXTRACTION      Assessment & Plan Clinical Impression: Patient is a 71 year old right-handed female with history of hypertension, normocytic anemia, hyperlipidemia as well as hypothyroidism.  Per chart review patient lives alone.  1 level home 4 steps to enter.  Independent prior to admission and working.  She does have good family support.  Presented 06/29/2021 with altered mental status and complaints of memory and recall issues for approximately 1-1/2 weeks.  She had reported a headache for approximately 2 days.  Denied any trauma or recent fall.  Patient did recently have 5 teeth extracted 06/16/2021 placed on clindamycin.  Cranial CT scan showed poorly visualized 2.3 cm right temporal mass with associated vasogenic edema as well as a 4 mm right to left midline shift.  CT of chest abdomen pelvis showed no findings of primary malignancy or metastatic disease.  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  Central necrosis and mild internal hemorrhage.  There was mass-effect with edema and 5 mm midline shift.  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 Pieter Partridge Dawley.  Maintained on Keppra for seizure prophylaxis as well as Decadron with taper as directed.  She was cleared to begin subcutaneous heparin for DVT prophylaxis 07/08/2021.  Patient transferred to CIR on 07/12/2021 .   Patient currently requires mod with mobility secondary to muscle weakness, decreased cardiorespiratoy endurance, decreased attention to left, and decreased sitting balance, decreased standing balance, decreased postural control, hemiplegia, and decreased balance strategies.  Prior to hospitalization, patient was independent  with mobility and lived with Alone in a House home.  Home access is 6Stairs to enter.  Patient will benefit from skilled PT intervention to maximize safe functional mobility, minimize fall risk, and decrease caregiver burden for planned discharge home with 24 hour supervision.  Anticipate patient will benefit from follow up [place] at discharge.  PT - End of Session Activity Tolerance: Tolerates 30+ min activity with multiple rests Endurance Deficit: Yes PT Assessment Rehab Potential (ACUTE/IP ONLY): Good PT Patient demonstrates impairments in the following area(s): Balance;Endurance;Motor;Pain;Perception;Safety PT Transfers Functional Problem(s): Bed to Chair;Car;Furniture PT Locomotion Functional Problem(s): Ambulation;Stairs PT Plan PT Intensity: Minimum of 1-2 x/day ,45 to 90 minutes PT Frequency: 5 out of 7 days PT Duration Estimated Length of Stay: 2 weeks PT Treatment/Interventions: Ambulation/gait training;Community reintegration;DME/adaptive equipment instruction;Neuromuscular re-education;Psychosocial support;Stair training;UE/LE Strength  taining/ROM;Wheelchair propulsion/positioning;Balance/vestibular training;Discharge planning;Functional electrical  stimulation;Pain management;Skin care/wound management;Therapeutic Activities;UE/LE Coordination activities;Cognitive remediation/compensation;Disease management/prevention;Functional mobility training;Patient/family education;Splinting/orthotics;Therapeutic Exercise;Visual/perceptual remediation/compensation PT Transfers Anticipated Outcome(s): Supervision PT Locomotion Anticipated Outcome(s): Supervision PT Recommendation Follow Up Recommendations: Home health PT;24 hour supervision/assistance Patient destination: Home Equipment Recommended: To be determined   PT Evaluation Precautions/Restrictions Precautions Precautions: Fall Precaution Comments: L inattention Restrictions Weight Bearing Restrictions: No General Chart Reviewed: Yes Family/Caregiver Present: Yes  Pain Pain Assessment Pain Scale: 0-10 Pain Score: 0-No pain Pain Interference Pain Interference Pain Effect on Sleep: 1. Rarely or not at all Pain Interference with Therapy Activities: 1. Rarely or not at all Pain Interference with Day-to-Day Activities: 1. Rarely or not at all Home Living/Prior [place] Available Help at Discharge: Family;Available 24 hours/day Type of Home: House Home Access: Stairs to enter [employer] of Steps: 6 Entrance Stairs-Rails: Right Home Layout: One level  Lives With: Alone Prior Function Level of Independence: Independent with transfers;Independent with homemaking with ambulation;Independent with gait  Able to Take Stairs?: Yes Vocation: Full time employment Vision/Perception  Vision - History Ability to See in Adequate Light: 0 Adequate Perception Perception: Impaired Inattention/Neglect: Does not attend to left visual field Praxis Praxis: Intact  Cognition Overall Cognitive Status: Impaired/Different from baseline Arousal/Alertness:  Awake/alert Orientation Level: Oriented X4 Year: 2023 Month: January Day of Week: Correct Attention: Sustained Sustained Attention: Impaired Sustained Attention Impairment: Verbal basic;Functional basic Memory: Impaired Memory Impairment: Retrieval deficit;Decreased short term memory Decreased Short Term Memory: Verbal complex;Functional complex Awareness: Impaired Awareness Impairment: Emergent impairment Problem Solving: Impaired Problem Solving Impairment: Verbal complex;Functional complex Executive Function: Organizing;Self Monitoring (clock drawing) Organizing: Impaired Organizing Impairment: Verbal basic;Functional basic Self Monitoring: Impaired Self Monitoring Impairment: Verbal basic;Functional basic Safety/Judgment: Appears intact Sensation Sensation Light Touch: Appears Intact Coordination Gross Motor Movements are Fluid and Coordinated: Yes Fine Motor Movements are Fluid and Coordinated: Yes Motor  Motor Motor: Hemiplegia  Trunk/Postural Assessment  Cervical Assessment Cervical Assessment:  (forward head) Thoracic Assessment Thoracic Assessment:  (rounded shoulders) Lumbar Assessment Lumbar Assessment:  (posterior pelvic tilt) Postural Control Postural Control: Deficits on evaluation (delayed and inadequate)  Balance Balance Balance Assessed: Yes Static Sitting Balance Static Sitting - Balance Support: Feet supported Static Sitting - Level of Assistance: 5: Stand by assistance Dynamic Sitting Balance Dynamic Sitting - Balance Support: Feet supported Dynamic Sitting - Level of Assistance: 5: Stand by assistance Static Standing Balance Static Standing - Balance Support: Bilateral upper extremity supported;During functional activity Static Standing - Level of Assistance: 4: Min assist Dynamic Standing Balance Dynamic Standing - Balance Support: Bilateral upper extremity supported;During functional activity Dynamic Standing - Level of Assistance: 4: Min  assist Extremity Assessment  RLE Assessment General Strength Comments: Grossly 4/5 LLE Assessment General Strength Comments: Grossly 3+/5  Care Tool Care Tool Bed Mobility Roll left and right activity   Roll left and right assist level: Minimal Assistance - Patient > 75%    Sit to lying activity   Sit to lying assist level: Moderate Assistance - Patient 50 - 74%    Lying to sitting on side of bed activity   Lying to sitting on side of bed assist level: the ability to move from lying on the back to sitting on the side of the bed with no back support.: Moderate Assistance - Patient 50 - 74%     Care Tool Transfers Sit to stand transfer   Sit to stand assist level: Minimal Assistance - Patient > 75%    Chair/bed transfer   Chair/bed transfer assist level: Minimal Assistance -  Patient > 75%     Toilet transfer   Assist Level: Minimal Assistance - Patient > 75%    Car transfer   Car transfer assist level: Minimal Assistance - Patient > 75%      Care Tool Locomotion Ambulation   Assist level: Minimal Assistance - Patient > 75% Assistive device: Walker-rolling Max distance: 100'  Walk 10 feet activity   Assist level: Minimal Assistance - Patient > 75% Assistive device: Walker-rolling   Walk 50 feet with 2 turns activity   Assist level: Minimal Assistance - Patient > 75% Assistive device: Walker-rolling  Walk 150 feet activity Walk 150 feet activity did not occur: Safety/medical concerns      Walk 10 feet on uneven surfaces activity   Assist level: Minimal Assistance - Patient > 75% Assistive device: Walker-rolling  Stairs   Assist level: Moderate Assistance - Patient - 50 - 74% Stairs assistive device: 2 hand rails Max number of stairs: 12  Walk up/down 1 step activity   Walk up/down 1 step (curb) assist level: Moderate Assistance - Patient - 50 - 74% Walk up/down 1 step or curb assistive device: 2 hand rails  Walk up/down 4 steps activity   Walk up/down 4 steps  assist level: Moderate Assistance - Patient - 50 - 74% Walk up/down 4 steps assistive device: 2 hand rails  Walk up/down 12 steps activity   Walk up/down 12 steps assist level: Moderate Assistance - Patient - 50 - 74% Walk up/down 12 steps assistive device: 2 hand rails  Pick up small objects from floor   Pick up small object from the floor assist level: Moderate Assistance - Patient 50 - 74%    Wheelchair Is the patient using a wheelchair?: No          Wheel 50 feet with 2 turns activity      Wheel 150 feet activity        Refer to Care Plan for Long Term Goals  SHORT TERM GOAL WEEK 1 PT Short Term Goal 1 (Week 1): Pt will perform supine to sit with minA consistently. PT Short Term Goal 2 (Week 1): Pt will perform bed to chair consistently with CGA. PT Short Term Goal 3 (Week 1): Pt will ambulate x150' with CGA and LRAD. PT Short Term Goal 4 (Week 1): Pt will perform x6 steps with R hand rail and minA.  Recommendations for other services: None   Skilled Therapeutic Intervention  Evaluation completed (see details above and below) with education on PT POC and goals and individual treatment initiated with focus on balance, transfers, ambulation, and stair training. Pt received seated in recliner and agrees to therapy. No complaint of pain. Sit to stand and stand step transfer to [facility] with minA and cues for hand placement, anterior weight shift, and sequencing. WC transport to gym for time management. Pt performs sit to stand throughout session with minA and cues for hand placement. Pt ambulates x100' with minA, primarily for RW propulsion and management as pt ambulates extremely slowly. Unable to functionally change speed despite verbal cues. Following extended seated rest break, pt performs x12 6" steps with bilateral hand rails and modA with cueing for step sequencing and L lower extremity motor activation. Pt completes car transfer and ramp navigation with RW and cues for sequencing and  safety. Pt left seated in WC with SLP present.  Mobility Bed Mobility Bed Mobility: Supine to Sit;Sit to Supine Supine to Sit: Moderate Assistance - Patient 50-74% Sit to Supine:  Moderate Assistance - Patient 50-74% Transfers Transfers: Sit to Stand;Stand Pivot Transfers;Stand to Sit Sit to Stand: Minimal Assistance - Patient > 75% Stand to Sit: Minimal Assistance - Patient > 75% Stand Pivot Transfers: Minimal Assistance - Patient > 75% Stand Pivot Transfer Details: Tactile cues for weight shifting;Verbal cues for technique;Verbal cues for safe use of DME/AE;Verbal cues for sequencing;Verbal cues for gait pattern Transfer (Assistive device): Rolling walker Locomotion  Gait Ambulation: Yes Gait Assistance: Minimal Assistance - Patient > 75% Gait Distance (Feet): 100 Feet Assistive device: Rolling walker Gait Assistance Details: Verbal cues for sequencing;Verbal cues for gait pattern;Verbal cues for technique;Tactile cues for initiation;Tactile cues for posture Gait Gait: Yes Gait Pattern: Impaired Gait Pattern:  (extremely slow and with bilateral shortened stride lengths) Stairs / Additional Locomotion Stairs: Yes Stairs Assistance: Moderate Assistance - Patient 50 - 74% Stair Management Technique: Two rails Number of Stairs: 12 Height of Stairs: 6 Ramp: Minimal Assistance - Patient >75% Curb: Moderate Assistance - Patient 50 - 74% Wheelchair Mobility Wheelchair Mobility: No   Discharge Criteria: Patient will be discharged from PT if patient refuses treatment 3 consecutive times without medical reason, if treatment goals not met, if there is a change in medical status, if patient makes no progress towards goals or if patient is discharged from hospital.  The above assessment, treatment plan, treatment alternatives and goals were discussed and mutually agreed upon: by patient  Breck Coons, PT, DPT 07/13/2021, 12:40 PM

## 2021-07-13 NOTE — IPOC Note (Signed)
Overall Plan of Care [facility] Patient Details Name: Denise Wright MRN: [id number] DOB: [birth year]  Admitting Diagnosis: Brain tumor [facility]  Hospital Problems: Principal Problem:   Brain tumor [facility])     Functional Problem List: Nursing Bladder, Endurance, Medication Management, Perception, Safety, Skin Integrity  PT Balance, Endurance, Motor, Pain, Perception, Safety  OT Balance, Cognition, Endurance, Motor, Edema, Pain, Perception, Safety, Sensory, Skin Integrity  SLP Cognition  TR         Basic ADLs: OT Eating, Grooming, Bathing, Toileting, Dressing     Advanced  ADLs: OT       Transfers: PT Bed to Chair, Car, Manufacturing systems engineer, Metallurgist: PT Ambulation, Stairs     Additional Impairments: OT Fuctional Use of Upper Extremity  SLP Social Cognition   Problem Solving, Memory, Awareness, Attention  TR      Anticipated Outcomes Item Anticipated Outcome  Self Feeding Mod I  Swallowing      Basic self-care  Supervision  Insurance underwriter Transfers Supervision  Bowel/Bladder  Supervision  Transfers  Supervision  Locomotion  Supervision  Communication  Mod I  Cognition  Supervision-Mod I  Pain  n/a  Safety/Judgment  supervision and no falls   Therapy Plan: PT Intensity: Minimum of 1-2 x/day ,45 to 90 minutes PT Frequency: 5 out of 7 days PT Duration Estimated Length of Stay: 2 weeks OT Intensity: Minimum of 1-2 x/day, 45 to 90 minutes OT Frequency: 5 out of 7 days OT Duration/Estimated Length of Stay: 2 weeks SLP Intensity: Minumum of 1-2 x/day, 30 to 90 minutes SLP Frequency: 3 to 5 out of 7 days SLP Duration/Estimated Length of Stay: 2 weeks   Due to the current state of emergency, patients may not be receiving their 3-hours of Medicare-mandated therapy.   Team Interventions: Nursing Interventions Patient/Family Education, Bladder Management, Disease Management/Prevention, Medication Management, Skin  Care/Wound Management, Discharge Planning  PT interventions Ambulation/gait training, Community reintegration, DME/adaptive equipment instruction, Neuromuscular re-education, Psychosocial support, Stair training, UE/LE Strength taining/ROM, Wheelchair propulsion/positioning, Training and development officer, Discharge planning, Functional electrical stimulation, Pain management, Skin care/wound management, Therapeutic Activities, UE/LE Coordination activities, Cognitive remediation/compensation, Disease management/prevention, Functional mobility training, Patient/family education, Splinting/orthotics, Therapeutic Exercise, Visual/perceptual remediation/compensation  OT Interventions Training and development officer, Cognitive remediation/compensation, Community reintegration, Discharge planning, Disease mangement/prevention, Functional electrical stimulation, Functional mobility training, DME/adaptive equipment instruction, Patient/family education, Psychosocial support, Neuromuscular re-education, Pain management, Self Care/advanced ADL retraining, Skin care/wound managment, Splinting/orthotics, Therapeutic Activities, Therapeutic Exercise, UE/LE Strength taining/ROM, UE/LE Coordination activities, Visual/perceptual remediation/compensation, Wheelchair propulsion/positioning  SLP Interventions Cognitive remediation/compensation, Environmental controls, Internal/external aids, Therapeutic Exercise, Therapeutic Activities, Patient/family education, Functional tasks, Cueing hierarchy  TR Interventions    SW/CM Interventions Discharge Planning, Psychosocial Support, Patient/Family Education   Barriers to Discharge MD  Medical stability  Nursing Decreased caregiver support, Home environment access/layout, Incontinence, Wound Care, Lack of/limited family support, Weight, Medication compliance 1 level, 4 steps. Daughter and additional family to provide care at discharge.  PT      OT      SLP      SW Other  (comments) Challenges with obtaining HH due to insurance. Pt would benefit better from outpatient.   Team Discharge Planning: Destination: PT-Home ,OT- Home , SLP-Home Projected Follow-up: PT-Home health PT, 24 hour supervision/assistance, OT-  Home health OT, Outpatient OT, 24 hour supervision/assistance (HHOT vs OPOT), SLP-Home Health SLP, Outpatient SLP Projected Equipment Needs: PT-To be determined, OT- To be determined, SLP-None recommended by SLP Equipment Details: PT- ,  OT-TBD Patient/family involved in discharge planning: PT- Patient, Family member/caregiver,  OT-Patient, Family member/caregiver, SLP-Patient, Family member/caregiver  MD ELOS: 13-15 days Medical Rehab Prognosis:  Excellent Assessment: The patient has been admitted for CIR therapies with the diagnosis of GBM s/p right craniotomy. The team will be addressing functional mobility, strength, stamina, balance, safety, adaptive techniques and equipment, self-care, bowel and bladder mgt, patient and caregiver education, NMR, cognition, communication, community reentry. Goals have been set at supervision for self-care, mobility and sup/mod I for communication,cognition.   Due to the current state of emergency, patients may not be receiving their 3 hours per day of Medicare-mandated therapy.    Meredith Staggers, MD, FAAPMR     See Team Conference Notes for weekly updates to the plan of care

## 2021-07-13 NOTE — Plan of Care (Signed)
°  Problem: RH Balance Goal: LTG: Patient will maintain dynamic sitting balance (OT) Description: LTG:  Patient will maintain dynamic sitting balance with assistance during activities of daily living (OT) Flowsheets (Taken 07/13/2021 1028) LTG: Pt will maintain dynamic sitting balance during ADLs with: Supervision/Verbal cueing   Problem: Sit to Stand Goal: LTG:  Patient will perform sit to stand in prep for activites of daily living with assistance level (OT) Description: LTG:  Patient will perform sit to stand in prep for activites of daily living with assistance level (OT) Flowsheets (Taken 07/13/2021 1028) LTG: PT will perform sit to stand in prep for activites of daily living with assistance level: Supervision/Verbal cueing   Problem: RH Eating Goal: LTG Patient will perform eating w/assist, cues/equip (OT) Description: LTG: Patient will perform eating with assist, with/without cues using equipment (OT) Flowsheets (Taken 07/13/2021 1028) LTG: Pt will perform eating with assistance level of: Set up assist    Problem: RH Bathing Goal: LTG Patient will bathe all body parts with assist levels (OT) Description: LTG: Patient will bathe all body parts with assist levels (OT) Flowsheets (Taken 07/13/2021 1028) LTG: Pt will perform bathing with assistance level/cueing: Supervision/Verbal cueing   Problem: RH Dressing Goal: LTG Patient will perform upper body dressing (OT) Description: LTG Patient will perform upper body dressing with assist, with/without cues (OT). Flowsheets (Taken 07/13/2021 1028) LTG: Pt will perform upper body dressing with assistance level of: Set up assist Goal: LTG Patient will perform lower body dressing w/assist (OT) Description: LTG: Patient will perform lower body dressing with assist, with/without cues in positioning using equipment (OT) Flowsheets (Taken 07/13/2021 1028) LTG: Pt will perform lower body dressing with assistance level of: Supervision/Verbal cueing    Problem: RH Toileting Goal: LTG Patient will perform toileting task (3/3 steps) with assistance level (OT) Description: LTG: Patient will perform toileting task (3/3 steps) with assistance level (OT)  Flowsheets (Taken 07/13/2021 1028) LTG: Pt will perform toileting task (3/3 steps) with assistance level: Supervision/Verbal cueing   Problem: RH Functional Use of Upper Extremity Goal: LTG Patient will use RT/LT upper extremity as a (OT) Description: LTG: Patient will use right/left upper extremity as a stabilizer/gross assist/diminished/nondominant/dominant level with assist, with/without cues during functional activity (OT) Flowsheets (Taken 07/13/2021 1028) LTG: Use of upper extremity in functional activities: LUE as diminished level   Problem: RH Tub/Shower Transfers Goal: LTG Patient will perform tub/shower transfers w/assist (OT) Description: LTG: Patient will perform tub/shower transfers with assist, with/without cues using equipment (OT) Flowsheets (Taken 07/13/2021 1028) LTG: Pt will perform tub/shower stall transfers with assistance level of: Supervision/Verbal cueing   Problem: RH Attention Goal: LTG Patient will demonstrate this level of attention during functional activites (OT) Description: LTG:  Patient will demonstrate this level of attention during functional activites  (OT) Flowsheets (Taken 07/13/2021 1028) Patient will demonstrate above attention level in the following environment: Home LTG: Patient will demonstrate this level of attention during functional activites (OT): Supervision

## 2021-07-13 NOTE — Plan of Care (Signed)
°  Problem: RH Expression Communication Goal: LTG Patient will increase speech intelligibility (SLP) Description: LTG: Patient will increase speech intelligibility at word/phrase/conversation level with cues, % of the time (SLP) Flowsheets (Taken 07/13/2021 1147) LTG: Patient will increase speech intelligibility (SLP): Modified Independent   Problem: RH Problem Solving Goal: LTG Patient will demonstrate problem solving for (SLP) Description: LTG:  Patient will demonstrate problem solving for basic/complex daily situations with cues  (SLP) Flowsheets (Taken 07/13/2021 1147) LTG: Patient will demonstrate problem solving for (SLP): Complex daily situations LTG Patient will demonstrate problem solving for: Supervision   Problem: RH Memory Goal: LTG Patient will demonstrate ability for day to day (SLP) Description: LTG:   Patient will demonstrate ability for day to day recall/carryover during cognitive/linguistic activities with assist  (SLP) Flowsheets (Taken 07/13/2021 1147) LTG: Patient will demonstrate ability for day to day recall: Daily complex information LTG: Patient will demonstrate ability for day to day recall/carryover during cognitive/linguistic activities with assist (SLP): Modified Independent Goal: LTG Patient will use memory compensatory aids to (SLP) Description: LTG:  Patient will use memory compensatory aids to recall biographical/new, daily complex information with cues (SLP) Flowsheets (Taken 07/13/2021 1147) LTG: Patient will use memory compensatory aids to (SLP): Modified Independent   Problem: RH Attention Goal: LTG Patient will demonstrate this level of attention during functional activites (SLP) Description: LTG:  Patient will will demonstrate this level of attention during functional activites (SLP) Flowsheets (Taken 07/13/2021 1147) Patient will demonstrate during cognitive/linguistic activities the attention type of:  Selective  Alternating Patient will demonstrate this  level of attention during cognitive/linguistic activities in: Home LTG: Patient will demonstrate this level of attention during cognitive/linguistic activities with assistance of (SLP): Supervision   Problem: RH Awareness Goal: LTG: Patient will demonstrate awareness during functional activites type of (SLP) Description: LTG: Patient will demonstrate awareness during functional activites type of (SLP) Flowsheets (Taken 07/13/2021 1147) LTG: Patient will demonstrate awareness during cognitive/linguistic activities with assistance of (SLP): Modified Independent

## 2021-07-13 NOTE — Progress Notes (Signed)
Inpatient Rehabilitation Care Coordinator Assessment and Plan Patient Details  Name: Denise Wright MRN: [id number] Date of Birth: 03/19/1951  Today's Date: 07/13/2021  Hospital Problems: Principal Problem:   Brain tumor [facility])  Past Medical History:  Past Medical History:  Diagnosis Date   Hyperlipidemia    Hypertension    Pneumonia    Thyroid disease    Past Surgical History:  Past Surgical History:  Procedure Laterality Date   ABDOMINAL HYSTERECTOMY  9211   APPLICATION OF CRANIAL NAVIGATION Right 07/06/2021   Procedure: APPLICATION OF CRANIAL NAVIGATION;  Surgeon: Karsten Ro, DO;  Location: [place];  Service: Neurosurgery;  Laterality: Right;   CRANIOTOMY Right 07/06/2021   Procedure: CRANIOTOMY TUMOR EXCISION;  Surgeon: Karsten Ro, DO;  Location: [place];  Service: Neurosurgery;  Laterality: Right;   TONSILLECTOMY  1965   TOOTH EXTRACTION     Social History:  reports that she has never smoked. She has never used smokeless tobacco. She reports that she does not drink alcohol and does not use drugs.  Family / Support Systems Marital Status: Divorced Patient Roles: Parent Spouse/Significant Other: Divorced Children: 3 adul children- [name] (dtr; [phone]) and sons- Dwaine and Gwyndolyn Saxon. All children live in [place]. Other Supports: None reported Anticipated Caregiver: Children- [name] and Dwaine. Ability/Limitations of Caregiver: None reported Caregiver Availability: 24/7 Family Dynamics: Pt lives alone and works long hours.  Social History Preferred language: English Religion: Baptist Cultural Background: Pt has been working as Development worker, international aid with [employer] Education: [place] - How often do you need to have someone help you when you read instructions, pamphlets, or other written material from your doctor or pharmacy?: Never Writes: Yes Employment Status: Employed Name of Employer: [employer]  (Development worker, international aid for 7 years) Length of Employment: 66 (Years) Return to Work Plans: TBD. Reports that STD has been completed and in process. Pt reports she will use FMLA if needed. Legal History/Current Legal Issues: Denies Guardian/Conservator: N/A   Abuse/Neglect Abuse/Neglect Assessment Can Be Completed: Yes Physical Abuse: Denies Verbal Abuse: Denies Sexual Abuse: Denies Exploitation of patient/patient's resources: Denies Self-Neglect: Denies  Patient response to: Social Isolation - How often do you feel lonely or isolated from those around you?: Never  Emotional Status Pt's affect, behavior and adjustment status: Pt in good spirits at time of visit. Recent Psychosocial Issues: Denies Psychiatric History: Denies Substance Abuse History: Denies  Patient / Family Perceptions, Expectations & Goals Pt/Family understanding of illness & functional limitations: Pt and family have a general understanding of pt care needs Premorbid pt/family roles/activities: Independent Anticipated changes in roles/activities/participation: Assistance with ADLs/IADLs Pt/family expectations/goals: Pt goal is "going home and to be independent, and taking care of me." Pt family- "To be independent again."  [employer]: None Premorbid Home Care/DME Agencies: None Transportation available at discharge: TBD Is the patient able to respond to transportation needs?: Yes In the past 12 months, has lack of transportation kept you from medical appointments or from getting medications?: No In the past 12 months, has lack of transportation kept you from meetings, work, or from getting things needed for daily living?: No Resource referrals recommended: Neuropsychology  Discharge Planning Living Arrangements: Alone Support Systems: Children, Other relatives, Friends/neighbors Type of Residence: Private residence Insurance Resources: Multimedia programmer (specify), Medicare ([place]  (primary) and Medicare Part A. Pharmacy-[facility] on [address] Financial Resources: Employment Financial Screen Referred: No Living Expenses: Own Money Management: Patient Does the patient have any problems  obtaining your medications?: No Home Management: Pt manages all home care needs Patient/Family Preliminary Plans: TBD Care Coordinator Barriers to Discharge: Other (comments) Care Coordinator Barriers to Discharge Comments: Challenges with obtaining HH due to insurance. Pt would benefit better from outpatient. Care Coordinator Anticipated Follow Up Needs: HH/OP  Clinical Impression SW met with pt, pt children- [name] and Dwaine, and pt friend [place]. Pt is not a English as a second language teacher. No HCPOA. No DME. Pt would like education on advanced care directive. SW informed pt assigned RN on consult request.   Rana Snare 07/13/2021, 12:45 PM

## 2021-07-14 ENCOUNTER — Telehealth: Payer: Self-pay | Admitting: Internal Medicine

## 2021-07-14 DIAGNOSIS — R7989 Other specified abnormal findings of blood chemistry: Secondary | ICD-10-CM | POA: Diagnosis not present

## 2021-07-14 DIAGNOSIS — I10 Essential (primary) hypertension: Secondary | ICD-10-CM | POA: Diagnosis not present

## 2021-07-14 DIAGNOSIS — G8194 Hemiplegia, unspecified affecting left nondominant side: Secondary | ICD-10-CM | POA: Diagnosis not present

## 2021-07-14 DIAGNOSIS — D496 Neoplasm of unspecified behavior of brain: Secondary | ICD-10-CM | POA: Diagnosis not present

## 2021-07-14 LAB — BASIC METABOLIC PANEL
Anion gap: 8 (ref 5–15)
BUN: 32 mg/dL — ABNORMAL HIGH (ref 8–23)
CO2: 23 mmol/L (ref 22–32)
Calcium: 8.4 mg/dL — ABNORMAL LOW (ref 8.9–10.3)
Chloride: 102 mmol/L (ref 98–111)
Creatinine, Ser: 0.99 mg/dL (ref 0.44–1.00)
GFR, Estimated: >60 mL/min (ref >60)
Glucose, Bld: 213 mg/dL — ABNORMAL HIGH (ref 70–99)
Potassium: 3.9 mmol/L (ref 3.5–5.1)
Sodium: 133 mmol/L — ABNORMAL LOW (ref 135–145)

## 2021-07-14 NOTE — Telephone Encounter (Signed)
Moved pt's new pt appt with Dr. Mickeal Skinner to 2/9 per provider request. Spoke to pt's daughter who is aware of new appt date and time.

## 2021-07-14 NOTE — Progress Notes (Signed)
Physical Therapy Session Note  Patient Details  Name: Denise Wright MRN: [id number] Date of Birth: 27-Jul-1950  Today's Date: 07/14/2021 PT Individual Time: 1310-1406 PT Individual Time Calculation (min): 56 min   Short Term Goals: Week 1:  PT Short Term Goal 1 (Week 1): Pt will perform supine to sit with minA consistently. PT Short Term Goal 2 (Week 1): Pt will perform bed to chair consistently with CGA. PT Short Term Goal 3 (Week 1): Pt will ambulate x150' with CGA and LRAD. PT Short Term Goal 4 (Week 1): Pt will perform x6 steps with R hand rail and minA.  Skilled Therapeutic Interventions/Progress Updates:     Pt received seated in recliner and agrees to therapy. No complaint of pain. Sit to stand with MinA HHA and cues for initiation, hand placement, and sequencing. Pt performs stand step transfer to [facility] with HHA minA and cues for positioning. WC transport to gym for time management. Pt performs stand step transfer to Nustep with minA. Pt completes Nustep for strength and endurance training as well as reciprocal coordination and L hemibody NMR. Pt cued to maintain steps per minute >40 and pt is able to do so for 12:00 without rest break.   Pt ambulates x175' with RW and CGA, with cue to ambulate "as fast as possible". Pt ambulates marginally faster than previous session and maintains good posture and symmetrical reciprocal stride lengths. Following extended seated rest break, pt ambulates 175' without AD and with miNA, with cues for increasing gait speed and bilateral stride length.  WC transport back to room. Ambulatory transfer to recliner with miNA and cues for positioning and sequencing of stand to sit. Left seated in recliner with alarm intact and all needs within reach.  Therapy Documentation Precautions:  Precautions Precautions: Fall Precaution Comments: L inattention Restrictions Weight Bearing Restrictions: No   Therapy/Group: Individual Therapy  Breck Coons, PT,  DPT 07/14/2021, 2:48 PM

## 2021-07-14 NOTE — Progress Notes (Signed)
PROGRESS NOTE   Subjective/Complaints: Pt reports dizziness in therapies yesterday. Feels better this morning. Felt that therapy went well overall. Asked about her hoarse voice, SLP had mentioned to her ENT  ROS: Patient denies fever, rash, sore throat, blurred vision, dizziness, nausea, vomiting, diarrhea, cough, shortness of breath or chest pain, joint or back/neck pain,   or mood change.    Objective:   No results found. Recent Labs    07/12/21 1404 07/13/21 0525  WBC 14.0* 13.7*  HGB 11.1* 11.2*  HCT 32.7* 32.9*  PLT 244 237   Recent Labs    07/13/21 0525 07/14/21 0519  NA 135 133*  K 4.1 3.9  CL 101 102  CO2 25 23  GLUCOSE 230* 213*  BUN 32* 32*  CREATININE 1.05* 0.99  CALCIUM 8.6* 8.4*    Intake/Output Summary (Last 24 hours) at 07/14/2021 1058 Last data filed at 07/14/2021 0700 Gross per 24 hour  Intake 240 ml  Output --  Net 240 ml        Physical Exam: Vital Signs Blood pressure 133/67, pulse 81, temperature 97.9 F (36.6 C), temperature source Oral, resp. rate 18, height 5' 2.01" (1.575 m), weight 95.3 kg, SpO2 99 %.  Constitutional: No distress . Vital signs reviewed. HEENT: NCAT, EOMI, oral membranes moist, dysphonic Neck: supple Cardiovascular: RRR without murmur. No JVD    Respiratory/Chest: CTA Bilaterally without wheezes or rales. Normal effort    GI/Abdomen: BS +, non-tender, non-distended Ext: no clubbing, cyanosis, or edema Psych: pleasant and cooperative  Skin: scalp incision cdi with scab,staples, area a little sensitive to touch--stable Neuro:  Alert and oriented x 3. Normal insight and awareness. Intact Memory. Normal language and speech. Cranial nerve exam unremarkable. RUE and RLE 4-5/5. LUE 4-, LLE 3+ to 4-. Sensed LT/PP on all 4's. DTR's 1+ Musculoskeletal: Full ROM, No pain with AROM or PROM in the neck, trunk, or extremities. Posture appropriate     Assessment/Plan: 1.  Functional deficits which require 3+ hours per day of interdisciplinary therapy in a comprehensive inpatient rehab setting. Physiatrist is providing close team supervision and 24 hour management of active medical problems listed below. Physiatrist and rehab team continue to assess barriers to discharge/monitor patient progress toward functional and medical goals  Care Tool:  Bathing    Body parts bathed by patient: Left arm, Chest, Abdomen, Front perineal area, Right upper leg, Left upper leg, Face   Body parts bathed by helper: Left lower leg, Right lower leg, Buttocks, Right arm     Bathing assist Assist Level: Moderate Assistance - Patient 50 - 74%     Upper Body Dressing/Undressing Upper body dressing   What is the patient wearing?: Pull over shirt    Upper body assist Assist Level: Moderate Assistance - Patient 50 - 74%    Lower Body Dressing/Undressing Lower body dressing      What is the patient wearing?: Pants     Lower body assist Assist for lower body dressing: Moderate Assistance - Patient 50 - 74%     Toileting Toileting    Toileting assist Assist for toileting: Minimal Assistance - Patient > 75%     Transfers Chair/bed  transfer  Transfers assist     Chair/bed transfer assist level: Minimal Assistance - Patient > 75%     Locomotion Ambulation   Ambulation assist      Assist level: Minimal Assistance - Patient > 75% Assistive device: Walker-rolling Max distance: 100'   Walk 10 feet activity   Assist     Assist level: Minimal Assistance - Patient > 75% Assistive device: Walker-rolling   Walk 50 feet activity   Assist    Assist level: Minimal Assistance - Patient > 75% Assistive device: Walker-rolling    Walk 150 feet activity   Assist Walk 150 feet activity did not occur: Safety/medical concerns         Walk 10 feet on uneven surface  activity   Assist     Assist level: Minimal Assistance - Patient > 75% Assistive  device: Walker-rolling   Wheelchair     Assist Is the patient using a wheelchair?: No             Wheelchair 50 feet with 2 turns activity    Assist            Wheelchair 150 feet activity     Assist          Blood pressure 133/67, pulse 81, temperature 97.9 F (36.6 C), temperature source Oral, resp. rate 18, height 5' 2.01" (1.575 m), weight 95.3 kg, SpO2 99 %.  Medical Problem List and Plan: 1. 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   -Slow Decadron taper -Dr. Mickeal Skinner will see her here or reschedule her 2/9 appt             -patient may shower but incision must be covered.              -ELOS/Goals: 10-14 days S           -Continue CIR therapies including PT, OT, and SLP   2.  Antithrombotics: -DVT/anticoagulation:  Pharmaceutical: Heparin initiated 07/08/2021             -antiplatelet therapy: N/A 3. Pain Management: Hydrocodone as needed 4. Mood: Xanax 0.25 mg twice daily as needed             -antipsychotic agents: N/A 5. Neuropsych: This patient is capable of making decisions on her own behalf. 6. Skin/Wound Care: Routine skin checks 7. Fluids/Electrolytes/Nutrition: encourage intake  -protein supp has been for low albumin      8.  Seizure prophylaxis.  Keppra 500 mg twice daily 9.  Hypertension.    2/3 bp controlled off hctz 10.  Hypothyroidism.  Synthroid 11.  Normocytic anemia.  hgb stable at 11.2 12.  Recent multiple teeth extraction 06/16/2021.  Continue Clindamycin 300 mg every 8 hours 13. AKI: BUN/Cr   32/1.05-->still 32/0.99 2/3  -discussed fluid intake with patient  -held HCTZ  -recheck labs monday 14. Leukocytosis: improving, down to 13.7k today  -likely steroid effect--discussed that with patient 15. Dysphonia:  -likely d/t VC trauma from intubation  -observe for now, if persistent make ENT referral as outpt    LOS: 2 days A FACE TO  [place] 07/14/2021, 10:58 AM

## 2021-07-14 NOTE — Progress Notes (Signed)
Occupational Therapy Session Note  Patient Details  Name: Denise Wright MRN: [id number] Date of Birth: [birth year]  Today's Date: 07/14/2021 OT Individual Time: 2003-7944 OT Individual Time Calculation (min): 71 min    Short Term Goals: Week 1:  OT Short Term Goal 1 (Week 1): Patient will complete LB dressing with min A OT Short Term Goal 2 (Week 1): Patient will tolerate standing at the sink for 3 minutes in preparation for BADL task. OT Short Term Goal 3 (Week 1): Patient will use L UE to apply deodorant with CGA.  Skilled Therapeutic Interventions/Progress Updates:    Pt greeted semi-reclined in bed with 2 visitors present. Visitors left for therapy session. Pt completed bed mobility with increased time and min A. Pt reported need to go to the bathroom. Sit<>stand w/ RW and min A. Ambulation into bathroom w/ RW and CGA. Pt voided bowel and bladder, then stood to complete peri-care with set-up and CGA for balance. Pt with VERY slow gait speed requiring verbal cues to increase ambulation speed. Dressing tasks from recliner with education provided for hemi techniques. Incorporated functional use of L UE with all BADL tasks by having pt reach for objects on L side and open container with L hand. Pt's son entered room and was on the phone, which was very distracting to patient. She would constantly pause in the middle of a functional task and needs cues to continue. OT provided pt with small cut up foam cubes and addressed functional use of L UE with fine motor task. Focus on in-hand manipulation of foam cubes. Pt needed moderate multimodal cues to maintain attention to task in distracting environment. Pt left seated in recliner with chair alarm on, call bell in reach and needs met.   Therapy Documentation Precautions:  Precautions Precautions: Fall Precaution Comments: L inattention Restrictions Weight Bearing Restrictions: No  Pain: Pain Assessment Pain Scale: 0-10 Pain Score: 0-No  pain    Therapy/Group: Individual Therapy  Valma Cava 07/14/2021, 10:47 AM

## 2021-07-14 NOTE — Progress Notes (Signed)
Speech Language Pathology Daily Session Note  Patient Details  Name: Denise Wright MRN: [id number] Date of Birth: [birth year]  Today's Date: 07/14/2021 SLP Individual Time: 1101-1200 SLP Individual Time Calculation (min): 59 min  Short Term Goals: Week 1: SLP Short Term Goal 1 (Week 1): Pt will complete simple medication/money management with min A SLP Short Term Goal 2 (Week 1): Pt will utilize internal/external aids to increase functional recall with Min A SLP Short Term Goal 3 (Week 1): Pt will solve moderately complex problems with min A cues SLP Short Term Goal 4 (Week 1): Pt will participate in functional alternating attention activities with min A verbal cues SLP Short Term Goal 5 (Week 1): Pt will utilize compensatory strategies for speech intelligibility to increase vocal volume with Supervision A  Skilled Therapeutic Interventions: Pt seen for skilled ST with focus on cognitive goals, pt transferred from recliner to wheelchair with min A and brought to Speech room for tasks. Pt completing portions of the ALFA, "Counting Money" - 50%, "Solving Daily Math Problems" - 90%. Pt also completing functional writing task with 100% legibility. Pt voice continues to be very hoarse and quiet at baseline (spoke with MD about it this AM), requiring consistent cues to increase breath support and intensity for intelligibility at phrase/sentence level. Pt transported back to room after session, transferred to recliner and left with family present for needs. Cont ST POC.   Pain Pain Assessment Pain Scale: 0-10 Pain Score: 0-No pain  Therapy/Group: Individual Therapy  Dewaine Conger 07/14/2021, 11:43 AM

## 2021-07-14 NOTE — Care Management (Signed)
Inpatient Rehabilitation Center Individual Statement of Services  Patient Name:  Denise Wright  Date:  07/14/2021  Welcome to the [facility].  Our goal is to provide you with an individualized program based on your diagnosis and situation, designed to meet your specific needs.  With this comprehensive rehabilitation program, you will be expected to participate in at least 3 hours of rehabilitation therapies Monday-Friday, with modified therapy programming on the weekends.  Your rehabilitation program will include the following services:  Physical Therapy (PT), Occupational Therapy (OT), Speech Therapy (ST), 24 hour per day rehabilitation nursing, Therapeutic Recreaction (TR), Psychology, Neuropsychology, Care Coordinator, Rehabilitation Medicine, [place], and Other  Weekly team conferences will be held on Tuesdays to discuss your progress.  Your Inpatient Rehabilitation Care Coordinator will talk with you frequently to get your input and to update you on team discussions.  Team conferences with you and your family in attendance may also be held.  Expected length of stay: 2 weeks    Overall anticipated outcome: Supervision  Depending on your progress and recovery, your program may change. Your Inpatient Rehabilitation Care Coordinator will coordinate services and will keep you informed of any changes. Your Inpatient Rehabilitation Care Coordinator's name and contact numbers are listed  below.  The following services may also be recommended but are not provided by the [facility] will be made to provide these services after discharge if needed.  Arrangements include referral to agencies that provide these services.  Your insurance has been verified to be:  [place]  Your primary doctor is:   Luella Cook  Pertinent information will be shared with your doctor and your insurance company.  Inpatient Rehabilitation Care Coordinator:  Cathleen Corti [phone] or ([phone]  Information discussed with and copy given to patient by: Rana Snare, 07/14/2021, 9:45 AM

## 2021-07-15 DIAGNOSIS — D496 Neoplasm of unspecified behavior of brain: Secondary | ICD-10-CM | POA: Diagnosis not present

## 2021-07-15 MED ORDER — HYDROCHLOROTHIAZIDE 12.5 MG PO TABS
6.2500 mg | ORAL_TABLET | Freq: Every day | ORAL | Status: DC
  Administered 2021-07-15 – 2021-07-22 (×8): 6.25 mg via ORAL
  Filled 2021-07-15 (×8): qty 1

## 2021-07-15 NOTE — Progress Notes (Signed)
PROGRESS NOTE   Subjective/Complaints: Notes lower extremity edema Asks when HCTZ will be restarted Discussed held due to AKI, which has now normalized, and normal blood pressures, but systolic trending up today  ROS: Patient denies fever, rash, sore throat, blurred vision, dizziness, nausea, vomiting, diarrhea, cough, shortness of breath or chest pain, joint or back/neck pain,   or mood change. +lower extremity edema   Objective:   No results found. Recent Labs    07/13/21 0525  WBC 13.7*  HGB 11.2*  HCT 32.9*  PLT 237   Recent Labs    07/13/21 0525 07/14/21 0519  NA 135 133*  K 4.1 3.9  CL 101 102  CO2 25 23  GLUCOSE 230* 213*  BUN 32* 32*  CREATININE 1.05* 0.99  CALCIUM 8.6* 8.4*    Intake/Output Summary (Last 24 hours) at 07/15/2021 1701 Last data filed at 07/15/2021 0730 Gross per 24 hour  Intake 656 ml  Output --  Net 656 ml        Physical Exam: Vital Signs Blood pressure 116/71, pulse 78, temperature 99 F (37.2 C), resp. rate 16, height 5' 2.01" (1.575 m), weight 95.3 kg, SpO2 99 %.  Constitutional: No distress . Vital signs reviewed. HEENT: NCAT, EOMI, oral membranes moist, dysphonic Neck: supple Cardiovascular: RRR without murmur. No JVD    Respiratory/Chest: CTA Bilaterally without wheezes or rales. Normal effort    GI/Abdomen: BS +, non-tender, non-distended Ext: +bilateral lower extremity edema Psych: pleasant and cooperative  Skin: scalp incision cdi with scab,staples, area a little sensitive to touch--stable Neuro:  Alert and oriented x 3. Normal insight and awareness. Intact Memory. Normal language and speech. Cranial nerve exam unremarkable. RUE and RLE 4-5/5. LUE 4-, LLE 3+ to 4-. Sensed LT/PP on all 4's. DTR's 1+ Musculoskeletal: Full ROM, No pain with AROM or PROM in the neck, trunk, or extremities. Posture appropriate     Assessment/Plan: 1. Functional deficits which require 3+  hours per day of interdisciplinary therapy in a comprehensive inpatient rehab setting. Physiatrist is providing close team supervision and 24 hour management of active medical problems listed below. Physiatrist and rehab team continue to assess barriers to discharge/monitor patient progress toward functional and medical goals  Care Tool:  Bathing    Body parts bathed by patient: Left arm, Chest, Abdomen, Front perineal area, Right upper leg, Left upper leg, Face   Body parts bathed by helper: Buttocks     Bathing assist Assist Level: Moderate Assistance - Patient 50 - 74%     Upper Body Dressing/Undressing Upper body dressing   What is the patient wearing?: Pull over shirt    Upper body assist Assist Level: Moderate Assistance - Patient 50 - 74%    Lower Body Dressing/Undressing Lower body dressing      What is the patient wearing?: Pants, Incontinence brief     Lower body assist Assist for lower body dressing: Moderate Assistance - Patient 50 - 74%     Toileting Toileting    Toileting assist Assist for toileting: Moderate Assistance - Patient 50 - 74%     Transfers Chair/bed transfer  Transfers assist     Chair/bed transfer assist level:  Minimal Assistance - Patient > 75%     Locomotion Ambulation   Ambulation assist      Assist level: Minimal Assistance - Patient > 75% Assistive device: Walker-rolling Max distance: 100'   Walk 10 feet activity   Assist     Assist level: Minimal Assistance - Patient > 75% Assistive device: Walker-rolling   Walk 50 feet activity   Assist    Assist level: Minimal Assistance - Patient > 75% Assistive device: Walker-rolling    Walk 150 feet activity   Assist Walk 150 feet activity did not occur: Safety/medical concerns         Walk 10 feet on uneven surface  activity   Assist     Assist level: Minimal Assistance - Patient > 75% Assistive device: Walker-rolling   Wheelchair     Assist Is  the patient using a wheelchair?: No             Wheelchair 50 feet with 2 turns activity    Assist            Wheelchair 150 feet activity     Assist          Blood pressure 116/71, pulse 78, temperature 99 F (37.2 C), resp. rate 16, height 5' 2.01" (1.575 m), weight 95.3 kg, SpO2 99 %.  Medical Problem List and Plan: 1. 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   -Slow Decadron taper -Dr. Mickeal Skinner will see her here or reschedule her 2/9 appt             -patient may shower but incision must be covered.              -ELOS/Goals: 10-14 days S           -Continue CIR therapies including PT, OT, and SLP   2.  Antithrombotics: -DVT/anticoagulation:  Pharmaceutical: Heparin initiated 07/08/2021             -antiplatelet therapy: N/A 3. Pain Management: Hydrocodone as needed 4. Mood: Xanax 0.25 mg twice daily as needed             -antipsychotic agents: N/A 5. Neuropsych: This patient is capable of making decisions on her own behalf. 6. Skin/Wound Care: Routine skin checks 7. Fluids/Electrolytes/Nutrition: encourage intake  -protein supp has been for low albumin      8.  Seizure prophylaxis.  continue Keppra 500 mg twice daily 9.  Hypertension.    Slightly elevated, patient wishes to restart HCTZ due to edema, will do so.  10.  Hypothyroidism.  Synthroid 11.  Normocytic anemia.  hgb stable at 11.2 12.  Recent multiple teeth extraction 06/16/2021.  Continue Clindamycin 300 mg every 8 hours 13. AKI: BUN/Cr   32/1.05-->still 32/0.99 2/3  -discussed fluid intake with patient  -discussed that AKI has normalized.  14. Leukocytosis: improving, down to 13.7k today  -likely steroid effect--discussed that with patient 15. Dysphonia:  -likely d/t VC trauma from intubation  -observe for now, if persistent make ENT referral as outpt 16. Bilateral lower extremity edema: start low  dose HCTZ. Continue low/no added salt diet.    LOS: 3 days A FACE TO FACE EVALUATION WAS PERFORMED  Clide Deutscher Nur Krasinski 07/15/2021, 5:01 PM

## 2021-07-15 NOTE — Progress Notes (Signed)
Speech Language Pathology Daily Session Note  Patient Details  Name: Denise Wright MRN: [id number] Date of Birth: 04/22/1951  Today's Date: 07/15/2021 SLP Individual Time: 1300-1345 SLP Individual Time Calculation (min): 45 min  Short Term Goals: Week 1: SLP Short Term Goal 1 (Week 1): Pt will complete simple medication/money management with min A SLP Short Term Goal 2 (Week 1): Pt will utilize internal/external aids to increase functional recall with Min A SLP Short Term Goal 3 (Week 1): Pt will solve moderately complex problems with min A cues SLP Short Term Goal 4 (Week 1): Pt will participate in functional alternating attention activities with min A verbal cues SLP Short Term Goal 5 (Week 1): Pt will utilize compensatory strategies for speech intelligibility to increase vocal volume with Supervision A  Skilled Therapeutic Interventions: Pt seen for skilled ST with focus on cognitive goals, family members and friend present, patient in bed and agreeable to therapeutic tasks. SLP facilitating functional alternating attention during mildly complex calendar task by providing mod A fading to min A verbal and visual cues. Pt benefiting from extra processing time. MD in/out for assessment, pt asking appropriate questions regarding care, current medications, etc with ability to recall medical details and recent events with accuracy. Pt left in bed with alarm set and family/friend present for needs. Cont ST POC.   Pain Pain Assessment Pain Scale: 0-10 Pain Score: 0-No pain  Therapy/Group: Individual Therapy  Dewaine Conger 07/15/2021, 2:40 PM

## 2021-07-15 NOTE — Progress Notes (Addendum)
Physical Therapy Session Note  Patient Details  Name: Denise Wright MRN: [id number] Date of Birth: 11-Aug-1950  Today's Date: 07/15/2021 PT Individual Time: 0800-0903 PT Individual Time Calculation (min): 63 min   Short Term Goals: Week 1:  PT Short Term Goal 1 (Week 1): Pt will perform supine to sit with minA consistently. PT Short Term Goal 2 (Week 1): Pt will perform bed to chair consistently with CGA. PT Short Term Goal 3 (Week 1): Pt will ambulate x150' with CGA and LRAD. PT Short Term Goal 4 (Week 1): Pt will perform x6 steps with R hand rail and minA.  Skilled Therapeutic Interventions/Progress Updates:  Pt propped up in bed, finishing her breakfast.  She denied pain.   Pt rolled L without railing, slightly elevated HOB, and sat EOB with supervision.  Sit to stand ,supervision with cue for hand placement, to doff blue brief, which was too small. PT removed it.  Pt stood with supervision x 2 during while pulling green brief over hips, CGA.  She donned 2 clean gowns with mod assist, in sitting.  Gait training on level tile , RW, with 1 turn, 94' in 90 seconds, CG/supervision.; standing rest break, x 105' to return to bed.  Stand> sit with 1 cue for hand placement.  Mod cues for increasing velocity, as pt was distracted by environment to R.    neuromuscular re-education via demo, multimodal cues for calf raises with RUE support, LUE reaching up to elongate L hemibody, x 10; mini squats with light touch bil UE support, x 10, R hip flexion with L hand on sink, x 10.  Sit> supine with supervision and extra time and cues to avoid use of rails.  At end of session, pt resting in bed iwht alarm set and needs at hand.  Son present.     Therapy Documentation Precautions:  Precautions Precautions: Fall Precaution Comments: L inattention Restrictions Weight Bearing Restrictions: No      Therapy/Group: Individual Therapy  Tiffiny Worthy 07/15/2021, 5:08 PM

## 2021-07-15 NOTE — Progress Notes (Signed)
Occupational Therapy Session Note  Patient Details  Name: Denise Wright MRN: [id number] Date of Birth: 10-29-1950  Today's Date: 07/15/2021 OT Individual Time: 3888-7579 OT Individual Time Calculation (min): 75 min    Short Term Goals: Week 1:  OT Short Term Goal 1 (Week 1): Patient will complete LB dressing with min A OT Short Term Goal 2 (Week 1): Patient will tolerate standing at the sink for 3 minutes in preparation for BADL task. OT Short Term Goal 3 (Week 1): Patient will use L UE to apply deodorant with CGA.  Skilled Therapeutic Interventions/Progress Updates:     Pt received in bed with no pain reported out of 10 pain  ADL: Pt completes ADL at overall CGA Level for UB ADLs/mobility and mod A for LB ADLs. Skilled interventions include: VC for bed mobility strategies from flat bed with no rails to simulate home environment on both sides, batheing with TTB with VC for transfer sequencing, VC for increasing pace of stepping, and A to wash B lower legs/feet in shower. Pt requires MIN A to maintain figure 4 positioning to doff/don socks. Pt able to use sock aide to don socks with MIN A to facilitate grasp on LUE. Edu re pt and family present when gettting OOB to flatten bed and not use rail to improve core strength and functional mobility required for home environment   Pt left at end of session in bed with exit alarm on, call light in reach and all needs met   Therapy Documentation Precautions:  Precautions Precautions: Fall Precaution Comments: L inattention Restrictions Weight Bearing Restrictions: No   Therapy/Group: Individual Therapy  Tonny Branch 07/15/2021, 6:53 AM

## 2021-07-16 NOTE — Progress Notes (Signed)
Slept good. PRN vicodin & xanax given at 7127, per patient's request. BLE edema. Denise Wright A

## 2021-07-16 NOTE — Progress Notes (Signed)
Physical Therapy Session Note  Patient Details  Name: LATERA MCLIN MRN: [id number] Date of Birth: 01-17-1951  Today's Date: 07/16/2021 PT Individual Time: 0758-0859 PT Individual Time Calculation (min): 61 min   Short Term Goals: Week 1:  PT Short Term Goal 1 (Week 1): Pt will perform supine to sit with minA consistently. PT Short Term Goal 2 (Week 1): Pt will perform bed to chair consistently with CGA. PT Short Term Goal 3 (Week 1): Pt will ambulate x150' with CGA and LRAD. PT Short Term Goal 4 (Week 1): Pt will perform x6 steps with R hand rail and minA.  Skilled Therapeutic Interventions/Progress Updates:    Chart reviewed and pt agreeable to therapy. Pt received semi-reclined in bed with no c/o pain. Session focused on functional mobility to increase independence with ADLs and safety awareness for home mobility. Pt initiated session with transfer to EOB using supervision and no bed rails. Pt emphasized need to practice bed mobility without bed rails to match home environment and was able to complete transfer to EOB accordingly but with increased time. Pt then completed upper body dressing with ModA for placement and increased time. Pt also practiced judging safe amb distance. Pt was able to amb 229ft with supervision + RW and no increase need of assistance by end of amb trial. Pt noted need for toilet and amb to toilet with supervision + RW and MinA assist for hygiene. Overall, pt noted to have extremely slow movement with need of increased time. At end of session, pt was left seated in recliner with alarm engaged, nurse call bell and all needs in reach.   Therapy Documentation Precautions:  Precautions Precautions: Fall Precaution Comments: L inattention Restrictions Weight Bearing Restrictions: No   Therapy/Group: Individual Therapy  Marquette Saa,  PT, DPT 07/16/2021, 12:10 PM

## 2021-07-17 ENCOUNTER — Inpatient Hospital Stay: Payer: BC Managed Care – PPO | Attending: Internal Medicine

## 2021-07-17 ENCOUNTER — Other Ambulatory Visit: Payer: Self-pay | Admitting: Radiation Therapy

## 2021-07-17 DIAGNOSIS — I10 Essential (primary) hypertension: Secondary | ICD-10-CM | POA: Insufficient documentation

## 2021-07-17 DIAGNOSIS — Z806 Family history of leukemia: Secondary | ICD-10-CM | POA: Insufficient documentation

## 2021-07-17 DIAGNOSIS — D496 Neoplasm of unspecified behavior of brain: Secondary | ICD-10-CM | POA: Diagnosis not present

## 2021-07-17 DIAGNOSIS — Z90710 Acquired absence of both cervix and uterus: Secondary | ICD-10-CM | POA: Insufficient documentation

## 2021-07-17 DIAGNOSIS — G8194 Hemiplegia, unspecified affecting left nondominant side: Secondary | ICD-10-CM | POA: Diagnosis not present

## 2021-07-17 DIAGNOSIS — C712 Malignant neoplasm of temporal lobe: Secondary | ICD-10-CM | POA: Insufficient documentation

## 2021-07-17 DIAGNOSIS — R7989 Other specified abnormal findings of blood chemistry: Secondary | ICD-10-CM | POA: Diagnosis not present

## 2021-07-17 LAB — CBC
HCT: 31.4 % — ABNORMAL LOW (ref 36.0–46.0)
Hemoglobin: 10.3 g/dL — ABNORMAL LOW (ref 12.0–15.0)
MCH: 30.6 pg (ref 26.0–34.0)
MCHC: 32.8 g/dL (ref 30.0–36.0)
MCV: 93.2 fL (ref 80.0–100.0)
Platelets: 257 10*3/uL (ref 150–400)
RBC: 3.37 MIL/uL — ABNORMAL LOW (ref 3.87–5.11)
RDW: 15 % (ref 11.5–15.5)
WBC: 8.9 10*3/uL (ref 4.0–10.5)
nRBC: 0 % (ref 0.0–0.2)

## 2021-07-17 LAB — BASIC METABOLIC PANEL
Anion gap: 10 (ref 5–15)
BUN: 29 mg/dL — ABNORMAL HIGH (ref 8–23)
CO2: 23 mmol/L (ref 22–32)
Calcium: 8.2 mg/dL — ABNORMAL LOW (ref 8.9–10.3)
Chloride: 99 mmol/L (ref 98–111)
Creatinine, Ser: 1.1 mg/dL — ABNORMAL HIGH (ref 0.44–1.00)
GFR, Estimated: 54 mL/min — ABNORMAL LOW (ref >60)
Glucose, Bld: 193 mg/dL — ABNORMAL HIGH (ref 70–99)
Potassium: 4 mmol/L (ref 3.5–5.1)
Sodium: 132 mmol/L — ABNORMAL LOW (ref 135–145)

## 2021-07-17 NOTE — Progress Notes (Signed)
Physical Therapy Session Note  Patient Details  Name: Denise Wright MRN: [id number] Date of Birth: 08-05-1950  Today's Date: 07/17/2021 PT Individual Time: 0903-0959 PT Individual Time Calculation (min): 56 min   Short Term Goals: Week 1:  PT Short Term Goal 1 (Week 1): Pt will perform supine to sit with minA consistently. PT Short Term Goal 2 (Week 1): Pt will perform bed to chair consistently with CGA. PT Short Term Goal 3 (Week 1): Pt will ambulate x150' with CGA and LRAD. PT Short Term Goal 4 (Week 1): Pt will perform x6 steps with R hand rail and minA.  Skilled Therapeutic Interventions/Progress Updates:     Pt received supine in bed and agrees to therapy. No complaint of pain. Requesting to use restroom. Pt performs supine to sit with elevated HOB and bed features with minA. Sit to stand with CGA and cues for initiation. Pt ambulates x10' to toilet without AD and with minA for stability and cues for positioning. Pt is continent of bowel and bladder and is independent with pericare. Pt ambualtes to sink with CGA and washes hands with close supervision. WC transport to gym for time management.  Pt ambulates x300' without AD and cues for upright gaze to improve posture and balance, and increasing gait speed to decrease risk for falls. Pt ambulates majority with CGA but occasional slight LOB (likely associated with very slow gait speed and prolonged single leg stance time), requiring minA for stability.  PT educates pt on 6 Minute Walk Tests and pt is agreeable to perform. Pt completes with CGA/minA with similar cues to previous bout of ambulation, with score of 414'.  WC transport back to room. Left seated with all needs within reach.  Therapy Documentation Precautions:  Precautions Precautions: Fall Precaution Comments: L inattention Restrictions Weight Bearing Restrictions: No  Therapy/Group: Individual Therapy  Breck Coons, PT, DPT 07/17/2021, 12:41 PM

## 2021-07-17 NOTE — Progress Notes (Signed)
PROGRESS NOTE   Subjective/Complaints: No major issues. Asked about bp, swelling. Denies dizziness over weekend. Has been trying to drink more fluids  ROS: Patient denies fever, rash, sore throat, blurred vision, dizziness, nausea, vomiting, diarrhea, cough, shortness of breath or chest pain, joint or back/neck pain, headache, or mood change.    Objective:   No results found. Recent Labs    07/17/21 0525  WBC 8.9  HGB 10.3*  HCT 31.4*  PLT 257   Recent Labs    07/17/21 0525  NA 132*  K 4.0  CL 99  CO2 23  GLUCOSE 193*  BUN 29*  CREATININE 1.10*  CALCIUM 8.2*    Intake/Output Summary (Last 24 hours) at 07/17/2021 1136 Last data filed at 07/17/2021 0700 Gross per 24 hour  Intake 720 ml  Output --  Net 720 ml        Physical Exam: Vital Signs Blood pressure 122/66, pulse 90, temperature 98.3 F (36.8 C), temperature source Oral, resp. rate 18, height 5' 2.01" (1.575 m), weight 95.3 kg, SpO2 100 %.  Constitutional: No distress . Vital signs reviewed. HEENT: NCAT, EOMI, oral membranes moist Neck: supple Cardiovascular: RRR without murmur. No JVD    Respiratory/Chest: CTA Bilaterally without wheezes or rales. Normal effort    GI/Abdomen: BS +, non-tender, non-distended Ext: no clubbing, cyanosis, or tr LE edema Psych: pleasant and cooperative  Skin: scalp incision cdi with staples  Neuro:  Alert and oriented x 3. Fair insight and awareness. Intact Memory. Normal language and speech. Cranial nerve exam unremarkable. RUE and RLE 4-5/5. LUE 4-, LLE 3+ to 4-. Sensed LT/PP on all 4's. DTR's 1+ Musculoskeletal: Full ROM, No pain with AROM or PROM in the neck, trunk, or extremities. Posture appropriate     Assessment/Plan: 1. Functional deficits which require 3+ hours per day of interdisciplinary therapy in a comprehensive inpatient rehab setting. Physiatrist is providing close team supervision and 24 hour  management of active medical problems listed below. Physiatrist and rehab team continue to assess barriers to discharge/monitor patient progress toward functional and medical goals  Care Tool:  Bathing    Body parts bathed by patient: Left arm, Chest, Abdomen, Front perineal area, Right upper leg, Left upper leg, Face   Body parts bathed by helper: Buttocks     Bathing assist Assist Level: Moderate Assistance - Patient 50 - 74%     Upper Body Dressing/Undressing Upper body dressing   What is the patient wearing?: Pull over shirt    Upper body assist Assist Level: Moderate Assistance - Patient 50 - 74%    Lower Body Dressing/Undressing Lower body dressing      What is the patient wearing?: Pants, Incontinence brief     Lower body assist Assist for lower body dressing: Moderate Assistance - Patient 50 - 74%     Toileting Toileting    Toileting assist Assist for toileting: Moderate Assistance - Patient 50 - 74%     Transfers Chair/bed transfer  Transfers assist     Chair/bed transfer assist level: Minimal Assistance - Patient > 75%     Locomotion Ambulation   Ambulation assist      Assist level:  Minimal Assistance - Patient > 75% Assistive device: Walker-rolling Max distance: 100'   Walk 10 feet activity   Assist     Assist level: Minimal Assistance - Patient > 75% Assistive device: Walker-rolling   Walk 50 feet activity   Assist    Assist level: Minimal Assistance - Patient > 75% Assistive device: Walker-rolling    Walk 150 feet activity   Assist Walk 150 feet activity did not occur: Safety/medical concerns         Walk 10 feet on uneven surface  activity   Assist     Assist level: Minimal Assistance - Patient > 75% Assistive device: Walker-rolling   Wheelchair     Assist Is the patient using a wheelchair?: No             Wheelchair 50 feet with 2 turns activity    Assist            Wheelchair 150  feet activity     Assist          Blood pressure 122/66, pulse 90, temperature 98.3 F (36.8 C), temperature source Oral, resp. rate 18, height 5' 2.01" (1.575 m), weight 95.3 kg, SpO2 100 %.  Medical Problem List and Plan: 1. 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   -Slow Decadron taper -Dr. Mickeal Skinner will see her here or reschedule her 2/9 appt-d/w pt today             -patient may shower but incision must be covered.              -ELOS/Goals: 10-14 days S          -Continue CIR therapies including PT, OT, and SLP  2.  Antithrombotics: -DVT/anticoagulation:  Pharmaceutical: Heparin initiated 07/08/2021             -antiplatelet therapy: N/A 3. Pain Management: Hydrocodone as needed 4. Mood: Xanax 0.25 mg twice daily as needed             -antipsychotic agents: N/A 5. Neuropsych: This patient is capable of making decisions on her own behalf. 6. Skin/Wound Care: Routine skin checks 7. Fluids/Electrolytes/Nutrition: encourage intake  -protein supp has been for low albumin      8.  Seizure prophylaxis.  continue Keppra 500 mg twice daily 9.  Hypertension.    Controlled, pt asked to restart HCTZ due to edema--received 3 doses over weekend, BUN/Cr not really changed  10.  Hypothyroidism.  Synthroid 11.  Normocytic anemia.  hgb stable at 11.2 12.  Recent multiple teeth extraction 06/16/2021.  Continue Clindamycin 300 mg every 8 hours 13. AKI: BUN/Cr   32/1.05-->still 32/0.99 2/3  -stll present  -push fluids  -HCTZ resumed for LE edema 14. Leukocytosis: down to 8.9 today  -likely steroid effect--discussed that with patient 15. 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

## 2021-07-17 NOTE — Progress Notes (Signed)
Occupational Therapy Session Note  Patient Details  Name: Denise Wright MRN: [id number] Date of Birth: 31-Jul-1950  Today's Date: 07/17/2021 OT Individual Time: 4473-9584 OT Individual Time Calculation (min): 27 min    Short Term Goals: Week 1:  OT Short Term Goal 1 (Week 1): Patient will complete LB dressing with min A OT Short Term Goal 2 (Week 1): Patient will tolerate standing at the sink for 3 minutes in preparation for BADL task. OT Short Term Goal 3 (Week 1): Patient will use L UE to apply deodorant with CGA.  Skilled Therapeutic Interventions/Progress Updates:    Pt greeted seated in wc and agreeable to OT treatment session. Pt declined need for any BADL tasks. Pt brought to therapy gym in wc. Stand-pivot to NuStep with CGA. Pt completed 10 minutes on NuStep on level 5 for 10 minutes without rest break, intermittent cues to maintain attention to exercise. Fine motor coordination with foam block activity focused on palmer translation and pinch. Pt returned to room and left seated in wc with alarm belt on, call bell in reach, and needs met.  Therapy Documentation Precautions:  Precautions Precautions: Fall Precaution Comments: L inattention Restrictions Weight Bearing Restrictions: No  Pain:  Denies pain   Therapy/Group: Individual Therapy  Valma Cava 07/17/2021, 2:55 PM

## 2021-07-17 NOTE — Progress Notes (Signed)
Occupational Therapy Note  Patient Details  Name: Denise Wright MRN: [id number] Date of Birth: 1951/02/12  Today's Date: 07/17/2021 OT Missed Time: 75 Minutes Missed Time Reason: Patient fatigue  Patient greeted seated in wc with legs propped up on the bed, son present. Pt reported recently returning from PT session. PT declined to participate in OT at this time due to fatigue. OT offered self-care tasks, but she continued to decline despite encouragement and multiple options for participation. Pt left seated in wc with needs met.    Daneen Schick Infant Zink 07/17/2021, 12:57 PM

## 2021-07-17 NOTE — Progress Notes (Signed)
Speech Language Pathology Daily Session Note  Patient Details  Name: Denise Wright MRN: [id number] Date of Birth: [birth year]  Today's Date: 07/17/2021 SLP Individual Time: 1884-1660 SLP Individual Time Calculation (min): 45 min  Short Term Goals: Week 1: SLP Short Term Goal 1 (Week 1): Pt will complete simple medication/money management with min A SLP Short Term Goal 2 (Week 1): Pt will utilize internal/external aids to increase functional recall with Min A SLP Short Term Goal 3 (Week 1): Pt will solve moderately complex problems with min A cues SLP Short Term Goal 4 (Week 1): Pt will participate in functional alternating attention activities with min A verbal cues SLP Short Term Goal 5 (Week 1): Pt will utilize compensatory strategies for speech intelligibility to increase vocal volume with Supervision A  Skilled Therapeutic Interventions: Skilled treatment session focused on cognitive goals. Upon arrival, patient was awake in bed and agreeable to treatment session. Patient reported seeing a "lady in the mirror" but patient's daughter provided redirection. Patient transferred to the wheelchair with extra time and Min verbal cues for problem solving with task. SLP facilitated session by providing Mod A verbal and visual cues for organization, problem solving and error awareness during a complex scheduling task. Patient requested to use the bathroom at end of session and was continent of bladder. Patient left upright in wheelchair with alarm on and all needs within reach. Continue with current plan of care.      Pain No/Denies Pain   Therapy/Group: Individual Therapy  Claudell Rhody 07/17/2021, 3:19 PM

## 2021-07-18 DIAGNOSIS — I10 Essential (primary) hypertension: Secondary | ICD-10-CM | POA: Diagnosis not present

## 2021-07-18 DIAGNOSIS — R7989 Other specified abnormal findings of blood chemistry: Secondary | ICD-10-CM | POA: Diagnosis not present

## 2021-07-18 DIAGNOSIS — G8194 Hemiplegia, unspecified affecting left nondominant side: Secondary | ICD-10-CM | POA: Diagnosis not present

## 2021-07-18 DIAGNOSIS — D496 Neoplasm of unspecified behavior of brain: Secondary | ICD-10-CM | POA: Diagnosis not present

## 2021-07-18 MED ORDER — DEXAMETHASONE 2 MG PO TABS
2.0000 mg | ORAL_TABLET | Freq: Three times a day (TID) | ORAL | Status: DC
  Administered 2021-07-18 – 2021-07-22 (×12): 2 mg via ORAL
  Filled 2021-07-18 (×11): qty 1

## 2021-07-18 NOTE — Patient Care Conference (Signed)
Inpatient RehabilitationTeam Conference and Plan of Care Update Date: 07/18/2021   Time: 10:40 AM    Patient Name: Denise Wright      Medical Record Number: [id number]  Date of Birth: April 24, 1951 Sex: Female         Room/Bed: 69C02C/5C02C-01 Payor Info: Payor: [place] / Plan: BCBS COMM PPO / Product Type: *No Product type* /    Admit Date/Time:  07/12/2021  1:48 PM  Primary Diagnosis:  Brain tumor [facility])  Hospital Problems: Principal Problem:   Brain tumor [facility])    Expected Discharge Date: Expected Discharge Date: 07/22/21  Team Members Present: Physician leading conference: Dr. Alger Simons Social Worker Present: Loralee Pacas, [place] Nurse Present: Dorthula Nettles, RN PT Present: Tereasa Coop, PT OT Present: Cherylynn Ridges, OT SLP Present: Sherren Kerns, SLP PPS Coordinator present : Gunnar Fusi, SLP     Current Status/Progress Goal Weekly Team Focus  Bowel/Bladder   incontinent b/b  regain continence  time toilet q 2hr and prn   Swallow/Nutrition/ Hydration             ADL's   Min A overall, Mod A  for LB dressing, poor attention  supervision  self-care retraining, attention during functional tasks, L UE fine motor coordination, activity tolerance   Mobility   supervision bed mobility, sit to stand, CGA gait without AD x300'  Supervision  Family ed, DC prep   Communication   min A for vocal intensity  mod I  speech intelligiblity   Safety/Cognition/ Behavioral Observations  mod A complex tasks  mod I-to-sup A  memory, problem solving, organization and awareness.   Pain   4/10 pain reported headache  <2  assess pain q 4hr and prn   Skin   incision to head with staples  incision to show signs of healing and no S/S of infection  assess skin q shift and prn     Discharge Planning:  Pt will d/c to home with her son and dtr being primary support. THere will be support from various family members as well. Patient will need to be appropriate for  outpatient at time of d/c due to challeneges with obtaining home health becuase of insurance. D/c recs TBD.   Team Discussion: Frontal lobe area resection 07/06/2021. Reports pain, fixates on swelling. BP controlled. Dysphonia will be treated Outpatient. Continent/incontinent B/B, has urinary urgency. Reports 4/10 headache. Incision to head healing with staples intact.   Patient on target to meet rehab goals: yes, supervision goals. Currently supervision function, ambulation. Using left side more, needs cues to stay on task. Slow/deliberate with everything. Working on problem solving, memory, and organization.  *See Care Plan and progress notes for long and short-term goals.   Revisions to Treatment Plan: Finalizing discharge plans and medications.  Teaching Needs: Family education, medication/pain management, skin/wound care, safety awareness, bowel/bladder management, transfer/gait training, etc.   Current Barriers to Discharge: Decreased caregiver support, Incontinence, Wound care, Weight, and Medication compliance  Possible Resolutions to Barriers: Family education Follow-up PT/OT/SLP Order recommended DME     Medical Summary Current Status: GBM s/p resection. left hemiparesis with cognitive deficits, h/a. lower ext edema  Barriers to Discharge: Medical stability   Possible Resolutions to [employer] Focus: daily assessment of labs, wounds, pain control   Continued Need for Acute Rehabilitation Level of Care: The patient requires daily medical management by a physician with specialized training in physical medicine and rehabilitation for the following reasons: Direction of a multidisciplinary physical rehabilitation program  to maximize functional independence : Yes Medical management of patient stability for increased activity during participation in an intensive rehabilitation regime.: Yes Analysis of laboratory values and/or radiology reports with any subsequent need for  medication adjustment and/or medical intervention. : Yes   I attest that I was present, lead the team conference, and concur with the assessment and plan of the team.   Cristi Loron 07/18/2021, 2:06 PM

## 2021-07-18 NOTE — Progress Notes (Signed)
Speech Language Pathology Daily Session Note  Patient Details  Name: Denise Wright MRN: [id number] Date of Birth: 05-Jun-1951  Today's Date: 07/18/2021 SLP Individual Time: 1350-1435 SLP Individual Time Calculation (min): 45 min  Short Term Goals: Week 1: SLP Short Term Goal 1 (Week 1): Pt will complete simple medication/money management with min A SLP Short Term Goal 2 (Week 1): Pt will utilize internal/external aids to increase functional recall with Min A SLP Short Term Goal 3 (Week 1): Pt will solve moderately complex problems with min A cues SLP Short Term Goal 4 (Week 1): Pt will participate in functional alternating attention activities with min A verbal cues SLP Short Term Goal 5 (Week 1): Pt will utilize compensatory strategies for speech intelligibility to increase vocal volume with Supervision A  Skilled Therapeutic Interventions: Skilled treatment session focused on cognitive goals. SLP facilitated session by providing overall Max verbal and visual cues for reasoning while interpreting directions for medication administration from prescription bottles. Max verbal and visual cues were also needed for problem solving while identifying errors within a TID pill box. By end of session, only Min verbal cues were needed for reasoning and problem solving. SLP also provided Min verbal cues for recall of the patient's current medications and their functions. Patient requested to use the bathroom at end of session and was continent of bladder with supervision verbal cues needed for safety with task. Patient left upright in wheelchair with alarm on and all needs within reach. Continue with current plan of care.      Pain No/Denies Pain   Therapy/Group: Individual Therapy  Drako Maese 07/18/2021, 3:33 PM

## 2021-07-18 NOTE — Plan of Care (Signed)
°  Problem: RH Expression Communication Goal: LTG Patient will increase speech intelligibility (SLP) Description: LTG: Patient will increase speech intelligibility at word/phrase/conversation level with cues, % of the time (SLP) Flowsheets (Taken 07/18/2021 1539) LTG: Patient will increase speech intelligibility (SLP): Supervision Note: Goal downgraded due to current ELOS   Problem: RH Problem Solving Goal: LTG Patient will demonstrate problem solving for (SLP) Description: LTG:  Patient will demonstrate problem solving for basic/complex daily situations with cues  (SLP) Flowsheets (Taken 07/18/2021 1539) LTG Patient will demonstrate problem solving for: Minimal Assistance - Patient > 75% Note: Goal downgraded due to current ELOS   Problem: RH Memory Goal: LTG Patient will demonstrate ability for day to day (SLP) Description: LTG:   Patient will demonstrate ability for day to day recall/carryover during cognitive/linguistic activities with assist  (SLP) Flowsheets (Taken 07/18/2021 1539) LTG: Patient will demonstrate ability for day to day recall/carryover during cognitive/linguistic activities with assist (SLP): Minimal Assistance - Patient > 75% Note: Goal downgraded due to current ELOS Goal: LTG Patient will use memory compensatory aids to (SLP) Description: LTG:  Patient will use memory compensatory aids to recall biographical/new, daily complex information with cues (SLP) Flowsheets (Taken 07/18/2021 1539) LTG: Patient will use memory compensatory aids to (SLP): Minimal Assistance - Patient > 75% Note: Goal downgraded due to current ELOS   Problem: RH Awareness Goal: LTG: Patient will demonstrate awareness during functional activites type of (SLP) Description: LTG: Patient will demonstrate awareness during functional activites type of (SLP) Flowsheets (Taken 07/18/2021 1539) Patient will demonstrate during cognitive/linguistic activities awareness type of: Emergent LTG: Patient will  demonstrate awareness during cognitive/linguistic activities with assistance of (SLP): Minimal Assistance - Patient > 75% Note: Goal downgraded due to current ELOS

## 2021-07-18 NOTE — Progress Notes (Addendum)
Patient ID: Denise Wright, female   DOB: [birth year], 71 y.o.   MRN: [id number]  SW met with pt, pt son Carlynn Herald, Sharman Crate, and friend Doylene Bode to provide updates from team conference, and d/c date 2/11. SW informed will provide updates once aware on d/c recs. Family education is scheduled for Friday (2/10) 9am-12pm with pt son, dtr, and friend.   SW faxed outpatient PT/OT/SLP referral to [facility] *[phone]/f:[phone]). SW ordered RW and 3in1 BSC with [employer] via parachute.   *SW spoke with pt dtr [name] [phone]) to discuss above. Reports she will f/u if she has DME available.   Loralee Pacas, MSW, [place] Office: [phone] Cell: [phone] Fax: [phone]

## 2021-07-18 NOTE — Progress Notes (Signed)
PROGRESS NOTE   Subjective/Complaints: Pt still focusing on edema in legs. Trying to keep feet elevated. Wearing TEDs. H/A still   ROS: Patient denies fever, rash, sore throat, blurred vision, dizziness, nausea, vomiting, diarrhea, cough, shortness of breath or chest pain, joint or back/neck pain,  or mood change.    Objective:   No results found. Recent Labs    07/17/21 0525  WBC 8.9  HGB 10.3*  HCT 31.4*  PLT 257   Recent Labs    07/17/21 0525  NA 132*  K 4.0  CL 99  CO2 23  GLUCOSE 193*  BUN 29*  CREATININE 1.10*  CALCIUM 8.2*    Intake/Output Summary (Last 24 hours) at 07/18/2021 1137 Last data filed at 07/18/2021 0700 Gross per 24 hour  Intake 480 ml  Output --  Net 480 ml        Physical Exam: Vital Signs Blood pressure (!) 170/87, pulse 98, temperature 98 F (36.7 C), resp. rate 20, height 5' 2.01" (1.575 m), weight 95.3 kg, SpO2 95 %.  Constitutional: No distress . Vital signs reviewed. HEENT: NCAT, EOMI, oral membranes moist Neck: supple Cardiovascular: RRR without murmur. No JVD    Respiratory/Chest: CTA Bilaterally without wheezes or rales. Normal effort    GI/Abdomen: BS +, non-tender, non-distended Ext: no clubbing, cyanosis, LE: TR to+ edema Psych: pleasant and cooperative  Skin: scalp incision cdi with staples  Neuro:  Alert and oriented x 3. Fair insight and awareness. Intact Memory. Normal language and speech. Cranial nerve exam unremarkable. RUE and RLE 4-5/5. LUE 4-, LLE 3+ to 4-. Sensed LT/PP on all 4's. DTR's 1+ Musculoskeletal: Full ROM, No pain with AROM or PROM in the neck, trunk, or extremities. Posture appropriate     Assessment/Plan: 1. Functional deficits which require 3+ hours per day of interdisciplinary therapy in a comprehensive inpatient rehab setting. Physiatrist is providing close team supervision and 24 hour management of active medical problems listed  below. Physiatrist and rehab team continue to assess barriers to discharge/monitor patient progress toward functional and medical goals  Care Tool:  Bathing    Body parts bathed by patient: Left arm, Chest, Abdomen, Front perineal area, Right upper leg, Left upper leg, Face   Body parts bathed by helper: Buttocks     Bathing assist Assist Level: Moderate Assistance - Patient 50 - 74%     Upper Body Dressing/Undressing Upper body dressing   What is the patient wearing?: Pull over shirt    Upper body assist Assist Level: Moderate Assistance - Patient 50 - 74%    Lower Body Dressing/Undressing Lower body dressing      What is the patient wearing?: Pants, Incontinence brief     Lower body assist Assist for lower body dressing: Moderate Assistance - Patient 50 - 74%     Toileting Toileting    Toileting assist Assist for toileting: Moderate Assistance - Patient 50 - 74%     Transfers Chair/bed transfer  Transfers assist     Chair/bed transfer assist level: Minimal Assistance - Patient > 75%     Locomotion Ambulation   Ambulation assist      Assist level: Minimal Assistance -  Patient > 75% Assistive device: Walker-rolling Max distance: 100'   Walk 10 feet activity   Assist     Assist level: Minimal Assistance - Patient > 75% Assistive device: Walker-rolling   Walk 50 feet activity   Assist    Assist level: Minimal Assistance - Patient > 75% Assistive device: Walker-rolling    Walk 150 feet activity   Assist Walk 150 feet activity did not occur: Safety/medical concerns         Walk 10 feet on uneven surface  activity   Assist     Assist level: Minimal Assistance - Patient > 75% Assistive device: Walker-rolling   Wheelchair     Assist Is the patient using a wheelchair?: No             Wheelchair 50 feet with 2 turns activity    Assist            Wheelchair 150 feet activity     Assist           Blood pressure (!) 170/87, pulse 98, temperature 98 F (36.7 C), resp. rate 20, height 5' 2.01" (1.575 m), weight 95.3 kg, SpO2 95 %.  Medical Problem List and Plan: 1. 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   -Slow Decadron taper -Dr. Mickeal Skinner will see her here or reschedule her 2/9 appt-d/w pt today             -patient may shower but incision must be covered.              -ELOS/Goals: 2/1, goals S          -Continue CIR therapies including PT, OT, and SLP. Interdisciplinary team conference today to discuss goals, barriers to discharge, and dc planning.    2.  Antithrombotics: -DVT/anticoagulation:  Pharmaceutical: Heparin initiated 07/08/2021             -antiplatelet therapy: N/A 3. Pain Management: Hydrocodone as needed for headaches 4. Mood: Xanax 0.25 mg twice daily as needed             -antipsychotic agents: N/A 5. Neuropsych: This patient is capable of making decisions on her own behalf. 6. Skin/Wound Care: Routine skin checks 7. Fluids/Electrolytes/Nutrition: encourage intake  -protein supp has been for low albumin      8.  Seizure prophylaxis.  continue Keppra 500 mg twice daily 9.  Hypertension/lower ext edema.    Controlled, pt asked to restart HCTZ due to edema--received 3 doses over weekend -decrease decadron to 2mg  q8. That should help bp  -will try ACE wraps of legs at night 10.  Hypothyroidism.  Synthroid 11.  Normocytic anemia.  hgb stable at 11.2 12.  Recent multiple teeth extraction 06/16/2021.  Continue Clindamycin 300 mg every 8 hours 13. AKI: BUN/Cr   32/1.05-->still 32/0.99 2/3  -stll present  -push fluids  -HCTZ resumed for LE edema  -recheck Thursday 14. Leukocytosis: down to 8.9    -likely steroid effect--discussed that with patient 15. 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

## 2021-07-18 NOTE — Progress Notes (Signed)
Occupational Therapy Session Note  Patient Details  Name: Denise Wright MRN: [id number] Date of Birth: 1951-04-12  Today's Date: 07/18/2021 OT Individual Time: 6962-9528 OT Individual Time Calculation (min): 58 min   Short Term Goals: Week 1:  OT Short Term Goal 1 (Week 1): Patient will complete LB dressing with min A OT Short Term Goal 2 (Week 1): Patient will tolerate standing at the sink for 3 minutes in preparation for BADL task. OT Short Term Goal 3 (Week 1): Patient will use L UE to apply deodorant with CGA.  Skilled Therapeutic Interventions/Progress Updates:    Pt greeted semi-reclined in bed and agreeable to OT treatment session. Pt agreeable to shower this morning. Pt completed bed mobility with supervision, but needed ample time and cues to initiate due to being distracted by conversation with friend in the room. Pt ambulated to bathroom w/ RW and close supservision with verbal cues for gait speed and LLE step length. Pt voided bladder with min A for clothing management, but pt able to complete peri-care. Pt transferred into shower by stepping over shower ledge using grab bars and CGA. Bathing completed with CGA for balance when standing to wash buttocks. OT issued LH sponge to wash lower legs and back. Pt able to grasp wash cloth and use L UE for bathing tasks with min verbal cues. Dressing tasks completed seated in wc with min A for LB dressing and education provided for hemi dressing techniques. Pt able to get LE's into figure 4 position, but needed min A to maintain position while reaching forward. Pt ambulated to the sink to brush her teeth with close supervision and min cues to integrate L UE into toothbrushing task. Pt also needed cues to terminate task as pt distracted again by conversations in room with son and friend. Pt returned to wc and left seated in wc with chair alarm on, call bell in reach, breakfast set-up, and needs met.  Therapy Documentation Precautions:   Precautions Precautions: Fall Precaution Comments: L inattention Restrictions Weight Bearing Restrictions: No  Pain:  Denies pain   Therapy/Group: Individual Therapy  Valma Cava 07/18/2021, 8:34 AM

## 2021-07-18 NOTE — Progress Notes (Signed)
Occupational Therapy Session Note  Patient Details  Name: Denise Wright MRN: [id number] Date of Birth: [birth year]  Today's Date: 07/18/2021 OT Individual Time: 1100-1212 OT Individual Time Calculation (min): 72 min (12 min make up time)   Short Term Goals: Week 1:  OT Short Term Goal 1 (Week 1): Patient will complete LB dressing with min A OT Short Term Goal 2 (Week 1): Patient will tolerate standing at the sink for 3 minutes in preparation for BADL task. OT Short Term Goal 3 (Week 1): Patient will use L UE to apply deodorant with CGA.  Skilled Therapeutic Interventions/Progress Updates:     Pt received in w/c with family present with no pain reported during session, just fatigue  ADL: Pt completes ADL at overall CGA-S level for functional mobility and toileting needs within the room. Pt at end of the session is able to complete toileting with tactile cues for L attention to L hip to pull pants past hips. Pt washes hands at sink standing with rolling walker however leave sink with water running requiring VC for attention to turn off water  Therapeutic activity Pt completes finger>palm manipulation of small puzzle pieces in prep for functional task with 4 instances of dropping d/t L inattention. Pt then completes same task with coins requiring A to turn coin over with anything smaller than a quarter. Pt able to hold coins and count coins while walking with decreased gait speed with dual task. Pt is unable to complete palm>finger translation and holds coins in R hand and picks up with L to put into vending machine slot. Pt completes LUE dynamic reaching task moving cones from floor-shoulder height to overhead shelf with pt able to raise up on toes with CGA to place onto high shelf. Pt completes dual task/coordination of BUE bouncing a ball and ambulating with 3 LOB laterally to L and severely slow gait speed and increased SL stance with dual task requiring MIN A for balance especially during  turns.   Functonal mobility therex/conditioning: 1 min each x 5 sec rest  supine<>sit in B directions- 3  sit to stand- 9 (CGA level) towel resistance presses- 8  Pt left at end of session in bed with exit alarm on, call light in reach and all needs met   Therapy Documentation Precautions:  Precautions Precautions: Fall Precaution Comments: L inattention Restrictions Weight Bearing Restrictions: No General:    Therapy/Group: Individual Therapy  Tonny Branch 07/18/2021, 6:51 AM

## 2021-07-18 NOTE — Progress Notes (Signed)
Physical Therapy Session Note  Patient Details  Name: Denise Wright MRN: [id number] Date of Birth: 26-Nov-1950  Today's Date: 07/18/2021 PT Individual Time: [phone] PT Individual Time Calculation (min): 42 min   Short Term Goals: Week 1:  PT Short Term Goal 1 (Week 1): Pt will perform supine to sit with minA consistently. PT Short Term Goal 2 (Week 1): Pt will perform bed to chair consistently with CGA. PT Short Term Goal 3 (Week 1): Pt will ambulate x150' with CGA and LRAD. PT Short Term Goal 4 (Week 1): Pt will perform x6 steps with R hand rail and minA.  Skilled Therapeutic Interventions/Progress Updates:     Pt received standing at sink washing hands with NT. Agreeable to therapy and reports mild pain in L hand. Number not provided. PT provides rest breaks as needed to manage pain. WC transport to gym for time management. Pt performs BERG Balance test. Pt scores 34/56 on Berg Balance Test, indicating high risk for falls.   PT explains TUG test and pt completes with following score:  17.8 seconds 18.5 seconds 17.1 seconds  WC transport back to room. Left seated in WC with all needs within reach.  Therapy Documentation Precautions:  Precautions Precautions: Fall Precaution Comments: L inattention Restrictions Weight Bearing Restrictions: No   Therapy/Group: Individual Therapy  Breck Coons, PT, DPT 07/18/2021, 2:44 PM

## 2021-07-19 LAB — SURGICAL PATHOLOGY

## 2021-07-19 NOTE — Progress Notes (Signed)
Physical Therapy Session Note  Patient Details  Name: Denise Wright MRN: [id number] Date of Birth: 25-Dec-1950  Today's Date: 07/19/2021 PT Individual Time: 4235-3614 PT Individual Time Calculation (min): 53 min   Short Term Goals: Week 1:  PT Short Term Goal 1 (Week 1): Pt will perform supine to sit with minA consistently. PT Short Term Goal 2 (Week 1): Pt will perform bed to chair consistently with CGA. PT Short Term Goal 3 (Week 1): Pt will ambulate x150' with CGA and LRAD. PT Short Term Goal 4 (Week 1): Pt will perform x6 steps with R hand rail and minA.  Skilled Therapeutic Interventions/Progress Updates:     Pt received seated in recliner and agrees to therapy. No complaint of pain. Sit to stand with CGA and cues for initiation and body mechanics. Pt ambulates to WC, x10' with CGA and cues for navigating crowded environment. WC transport to gym for time management. Pt completes x12 6" steps with bilateral hand rails and CGA, with cues for step sequencing. Pt takes seated rest break prior to performing stepping activity in parallel bars. Pt cued to step up onto 4 inch step with L leg first then perform toe tap in 8 inch step with R to promote increased L lower extremity extensor activation and motor control. 1x10. Pt then performs 1x10 ascending with R lower extremity and toe tapping with L.   WC transport to dayroom. Pt ambulates with RW to restroom and performs toilet transfer with cues for sequencing. Pt continent of urine and perform sit to stand without physical assistance. Pt ambulates additional 150' with RW and cues to increase gait speed to decrease risk for falls. WC transport back to room. Left seated in recliner with alarm intact and all needs within reach.  Therapy Documentation Precautions:  Precautions Precautions: Fall Precaution Comments: L inattention Restrictions Weight Bearing Restrictions: No    Therapy/Group: Individual Therapy  Breck Coons, PT,  DPT 07/19/2021, 4:04 PM

## 2021-07-19 NOTE — Progress Notes (Signed)
Speech Language Pathology Daily Session Note  Patient Details  Name: Denise Wright MRN: [id number] Date of Birth: Jan 26, 1951  Today's Date: 07/19/2021 SLP Individual Time: 9675-9163 SLP Individual Time Calculation (min): 60 min  Short Term Goals: Week 1: SLP Short Term Goal 1 (Week 1): Pt will complete simple medication/money management with min A SLP Short Term Goal 2 (Week 1): Pt will utilize internal/external aids to increase functional recall with Min A SLP Short Term Goal 3 (Week 1): Pt will solve moderately complex problems with min A cues SLP Short Term Goal 4 (Week 1): Pt will participate in functional alternating attention activities with min A verbal cues SLP Short Term Goal 5 (Week 1): Pt will utilize compensatory strategies for speech intelligibility to increase vocal volume with Supervision A  Skilled Therapeutic Interventions: Skilled treatment session focused on cognitive goals. Upon arrival, patient was asleep while supine in bed. Patient was easily roused but required extra time and Min verbal cues for initiation to sit EOB. Patient requested to use the bathroom and ambulated with supervision. Patient was continent of bowel and bladder without any unsafe behaviors noted during peri care. SLP facilitated session by providing overall Mod-Max A verbal and visual cues for problem solving and error awareness while organizing a TID pill box. The task was unable to be completed due to time constraints and will need to be continued during next session. Patient transferred to the recliner at end of session and left with family present and all needs within reach. Continue with current plan of care.      Pain No/Denies Pain   Therapy/Group: Individual Therapy  Fernando Torry, [place] 07/19/2021, 1:23 PM

## 2021-07-19 NOTE — Progress Notes (Signed)
PROGRESS NOTE   Subjective/Complaints: Headaches better. We discussed her staples today. Appreciative of efforts of therapy team. ACE wraps on legs  ROS: Patient denies fever, rash, sore throat, blurred vision, dizziness, nausea, vomiting, diarrhea, cough, shortness of breath or chest pain, joint or back/neck pain, headache, or mood change.     Objective:   No results found. Recent Labs    07/17/21 0525  WBC 8.9  HGB 10.3*  HCT 31.4*  PLT 257   Recent Labs    07/17/21 0525  NA 132*  K 4.0  CL 99  CO2 23  GLUCOSE 193*  BUN 29*  CREATININE 1.10*  CALCIUM 8.2*    Intake/Output Summary (Last 24 hours) at 07/19/2021 0857 Last data filed at 07/19/2021 0800 Gross per 24 hour  Intake 598 ml  Output --  Net 598 ml        Physical Exam: Vital Signs Blood pressure 139/74, pulse 87, temperature 98.3 F (36.8 C), temperature source Oral, resp. rate 16, height 5' 2.01" (1.575 m), weight 95.3 kg, SpO2 95 %.  Constitutional: No distress . Vital signs reviewed. HEENT: NCAT, EOMI, oral membranes moist, soft spoken Neck: supple Cardiovascular: RRR without murmur. No JVD    Respiratory/Chest: CTA Bilaterally without wheezes or rales. Normal effort    GI/Abdomen: BS +, non-tender, non-distended Ext: no clubbing, cyanosis, tr LE edema, ACE wraps on Psych: pleasant and cooperative  Skin: scalp incision cdi with staples  Neuro:  Alert and oriented x 3. Fair insight and awareness. Intact Memory. Normal language and speech. Cranial nerve exam unremarkable. RUE and RLE 4-5/5. LUE 4-, LLE 3+ to 4-. Sensed LT/PP on all 4's. DTR's 1+--neuro exam stable. Musculoskeletal: Full ROM, No pain with AROM or PROM in the neck, trunk, or extremities. Posture appropriate     Assessment/Plan: 1. Functional deficits which require 3+ hours per day of interdisciplinary therapy in a comprehensive inpatient rehab setting. Physiatrist is providing  close team supervision and 24 hour management of active medical problems listed below. Physiatrist and rehab team continue to assess barriers to discharge/monitor patient progress toward functional and medical goals  Care Tool:  Bathing    Body parts bathed by patient: Left arm, Chest, Abdomen, Front perineal area, Right upper leg, Left upper leg, Face   Body parts bathed by helper: Buttocks     Bathing assist Assist Level: Moderate Assistance - Patient 50 - 74%     Upper Body Dressing/Undressing Upper body dressing   What is the patient wearing?: Pull over shirt    Upper body assist Assist Level: Moderate Assistance - Patient 50 - 74%    Lower Body Dressing/Undressing Lower body dressing      What is the patient wearing?: Pants, Incontinence brief     Lower body assist Assist for lower body dressing: Moderate Assistance - Patient 50 - 74%     Toileting Toileting    Toileting assist Assist for toileting: Moderate Assistance - Patient 50 - 74%     Transfers Chair/bed transfer  Transfers assist     Chair/bed transfer assist level: Minimal Assistance - Patient > 75%     Locomotion Ambulation   Ambulation assist  Assist level: Minimal Assistance - Patient > 75% Assistive device: Walker-rolling Max distance: 100'   Walk 10 feet activity   Assist     Assist level: Minimal Assistance - Patient > 75% Assistive device: Walker-rolling   Walk 50 feet activity   Assist    Assist level: Minimal Assistance - Patient > 75% Assistive device: Walker-rolling    Walk 150 feet activity   Assist Walk 150 feet activity did not occur: Safety/medical concerns         Walk 10 feet on uneven surface  activity   Assist     Assist level: Minimal Assistance - Patient > 75% Assistive device: Walker-rolling   Wheelchair     Assist Is the patient using a wheelchair?: No             Wheelchair 50 feet with 2 turns  activity    Assist            Wheelchair 150 feet activity     Assist          Blood pressure 139/74, pulse 87, temperature 98.3 F (36.8 C), temperature source Oral, resp. rate 16, height 5' 2.01" (1.575 m), weight 95.3 kg, SpO2 95 %.  Medical Problem List and Plan: 1. 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   -Slow Decadron taper -Dr. Mickeal Skinner will see her here or reschedule her 2/9 appt-d/w pt today             -patient may shower but incision must be covered.              -ELOS/Goals: 2/11, goals S          -Continue CIR therapies including PT, OT, and SLP  2.  Antithrombotics: -DVT/anticoagulation:  Pharmaceutical: Heparin initiated 07/08/2021             -antiplatelet therapy: N/A 3. Pain Management: Hydrocodone as needed for headaches 4. Mood: Xanax 0.25 mg twice daily as needed             -antipsychotic agents: N/A 5. Neuropsych: This patient is capable of making decisions on her own behalf. 6. Skin/Wound Care: Routine skin checks 7. Fluids/Electrolytes/Nutrition: encourage intake  -protein supp has been for low albumin      8.  Seizure prophylaxis.  continue Keppra 500 mg twice daily 9.  Hypertension/lower ext edema.    Controlled, pt asked to restart HCTZ due to edema--received 3 doses over weekend -decreased decadron to 2mg  q8. That should help bp  -  ACE wraps of legs at night 10.  Hypothyroidism.  Synthroid 11.  Normocytic anemia.  hgb stable at 11.2 12.  Recent multiple teeth extraction 06/16/2021.  Continue Clindamycin 300 mg every 8 hours 13. AKI: BUN/Cr   32/1.05-->still 32/0.99 2/3  -stll present  -pushing fluids  -HCTZ resumed for LE edema  -recheck Thursday 14. Leukocytosis: down to 8.9    -likely steroid effect--discussed that with patient 15. Dysphonia:  -likely d/t VC trauma from intubation  -observe for now, if persistent make ENT referral as  outpt    LOS: 7 days A FACE TO [place] 07/19/2021, 8:57 AM

## 2021-07-19 NOTE — Progress Notes (Signed)
Received PRN xanax/Vicodin last pm per her request and slept throughout the night.

## 2021-07-19 NOTE — Discharge Summary (Signed)
Physician Discharge Summary  Patient ID: Denise Wright MRN: [id number] DOB/AGE: [birth year] 71 y.o.  Admit date: 07/12/2021 Discharge date: 07/22/2021  Discharge Diagnoses:  Principal Problem:   Brain tumor [facility]) DVT prophylaxis Mood stabilization Seizure prophylaxis Hypertension Hypothyroidism Normocytic anemia Recent multiple teeth extraction  Discharged Condition: Stable  Significant Diagnostic Studies: CT Head Wo Contrast  Result Date: 06/29/2021 CLINICAL DATA:  t c/o fatigue AND feeling "dis-combobulated" since oral surgery on 06/16/2021. Pt reports she fell on Christmas night when she tripped over a box of fruit on the floor but did not hit head. EXAM: CT HEAD WITHOUT CONTRAST TECHNIQUE: Contiguous axial images were obtained from the base of the skull through the vertex without intravenous contrast. RADIATION DOSE REDUCTION: This exam was performed according to the departmental dose-optimization program which includes automated exposure control, adjustment of the mA and/or kV according to patient size and/or use of iterative reconstruction technique. COMPARISON:  None. FINDINGS: Brain: No evidence of large-territorial acute infarction. No parenchymal hemorrhage. Poorly visualized 2.3 cm right temporal mass with associated vasogenic edema. No extra-axial collection. 4 mm right to left midline shift. Partial effacement of the right frontal and temporal sulci. No hydrocephalus. Basilar cisterns are patent. Vascular: No hyperdense vessel. Skull: No acute fracture or focal lesion. Sinuses/Orbits: Paranasal sinuses and mastoid air cells are clear. The orbits are unremarkable. Other: None. IMPRESSION: Poorly visualized 2.3 cm right temporal mass with associated vasogenic edema as well as 4 mm right to left midline shift. Finding consistent with malignancy. Recommend MRI brain with and without contrast for further evaluation. Electronically Signed   By: Iven Finn M.D.   On: 06/29/2021 22:55    MR BRAIN W WO CONTRAST  Result Date: 07/07/2021 CLINICAL DATA:  Brain/CNS neoplasm, staging postop EXAM: MRI HEAD WITHOUT AND WITH CONTRAST TECHNIQUE: Multiplanar, multiecho pulse sequences of the brain and surrounding structures were obtained without and with intravenous contrast. CONTRAST:  9.75mL GADAVIST GADOBUTROL 1 MMOL/ML IV SOLN COMPARISON:  Preoperative study 06/30/2021 FINDINGS: Brain: Postoperative changes of frontotemporal mass resection. Fluid, blood products, and air present within the resection cavity. Extra-axial collection is present underlying the craniotomy. There is bifrontal extra-axial air. Intrinsic T1 hyperintense blood products are present. There is some superimposed enhancement at the resection cavity margins that is likely postoperative. Mild nodularity is present inferiorly (series 23, image 18). Remains surrounding T2 FLAIR hyperintensity similar in extent to the preoperative study. Mass effect remains present including effacement the right lateral ventricle and mild leftward midline shift. There is no hydrocephalus. Vascular: Major vessel flow voids at the skull base are preserved. Skull and upper cervical spine: Normal marrow signal is preserved. Sinuses/Orbits: Minor mucosal thickening.  Orbits are unremarkable. Other: Sella is unremarkable.  Mastoid air cells are clear. IMPRESSION: Expected postoperative changes of gross total resection of right frontotemporal mass. Attention on follow-up recommended for mildly nodular enhancement at the inferior margin. Persistent mass effect with mild leftward midline shift. Electronically Signed   By: Macy Mis M.D.   On: 07/07/2021 16:41   MR Brain W and Wo Contrast  Result Date: 06/30/2021 CLINICAL DATA:  Brain mass. EXAM: MRI HEAD WITHOUT AND WITH CONTRAST TECHNIQUE: Multiplanar, multiecho pulse sequences of the brain and surrounding structures were obtained without and with intravenous contrast. CONTRAST:  9.67mL GADAVIST  GADOBUTROL 1 MMOL/ML IV SOLN COMPARISON:  CT head 06/29/2021. FINDINGS: Brain: Large mass lesion right temporal lobe. The mass shows irregular peripheral enhancement and central necrosis. Mild internal hemorrhage is noted. Surrounding nonenhancing edema is  noted. The mass measures approximately 4.5 x 5.7 x 4.5 cm. There is mass-effect with 5 mm midline shift to the right. No second lesion identified Mild patchy white matter hyperintensities bilaterally most likely chronic ischemia. No acute infarct. Vascular: Normal arterial flow voids. Skull and upper cervical spine: No focal skeletal lesion. Sinuses/Orbits: Mucosal edema and air-fluid level right maxillary sinus. Negative orbit Other: None IMPRESSION: Large enhancing mass lesion in the right temporal lobe. Central necrosis and mild internal hemorrhage. 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 There is mass-effect with edema and 5 mm midline shift. Electronically Signed   By: Franchot Gallo M.D.   On: 06/30/2021 18:00   CT CHEST ABDOMEN PELVIS W CONTRAST  Result Date: 06/30/2021 CLINICAL DATA:  Brain metastases, unknown primary. "Pt c/o fatigue AND feeling "dis-combobulated" since oral surgery on 06/16/2021. Pt reports she fell on Christmas night when she tripped over a box of fruit on the floor but did not hit head. EXAM: CT CHEST, ABDOMEN, AND PELVIS WITH CONTRAST TECHNIQUE: Multidetector CT imaging of the chest, abdomen and pelvis was performed following the standard protocol during bolus administration of intravenous contrast. RADIATION DOSE REDUCTION: This exam was performed according to the departmental dose-optimization program which includes automated exposure control, adjustment of the mA and/or kV according to patient size and/or use of iterative reconstruction technique. CONTRAST:  174mL OMNIPAQUE IOHEXOL 300 MG/ML  SOLN COMPARISON:  None. FINDINGS: CHEST: Ports and Devices: None. Lungs/airways: No focal consolidation. No pulmonary nodule. No pulmonary  mass. No pulmonary contusion or laceration. No pneumatocele formation. The central airways are patent. Pleura: No pleural effusion. No pneumothorax. No hemothorax. Lymph Nodes: No mediastinal, hilar, or axillary lymphadenopathy. Mediastinum: There is a 1.1 x 1 x 1.3 cm hypodensity within the mediastinum inferior to the right inferior pulmonary vein (2:35) no pneumomediastinum. No aortic injury or mediastinal hematoma. The thoracic aorta is normal in caliber. The heart is normal in size. No significant pericardial effusion. The esophagus is unremarkable.  Possible tiny hiatal hernia. The thyroid is unremarkable. The main pulmonary artery is normal in caliber. No central or proximal segmental pulmonary embolus. Chest Wall / Breasts: No chest wall mass. Musculoskeletal: No acute rib or sternal fracture. No spinal fracture. No suspicious lytic or blastic osseous lesion. Densely sclerotic T6 vertebral body lesion likely represents a bone island (6:84). ABDOMEN / PELVIS: Liver: Not enlarged. There is a 1.5 x 1 cm fluid density lesion within the right hepatic lobe (2:51) as well as another measuring 1.6 x 0 1 cm (2:49). Adjacent associated subcentimeter hypodensity (2:51). Couple of other subcentimeter hypodensities noted throughout the liver (2:35, 42). No laceration or subcapsular hematoma. Biliary System: The gallbladder is otherwise unremarkable with no radio-opaque gallstones. No biliary ductal dilatation. Pancreas: Normal pancreatic contour. No main pancreatic duct dilatation. Spleen: Not enlarged. Single punctate hypodensity too small to characterize. Otherwise no focal lesion. No laceration, subcapsular hematoma, or vascular injury. Adrenal Glands: No nodularity bilaterally. Kidneys: Bilateral kidneys enhance symmetrically. No hydronephrosis. No contusion, laceration, or subcapsular hematoma. Punctate hypodensities too small to characterize. No injury to the vascular structures or collecting systems. No  hydroureter. The urinary bladder is unremarkable. Bowel: No small or large bowel wall thickening or dilatation. Scattered colonic diverticulosis. The appendix is normal. Mesentery, Omentum, and Peritoneum: No simple free fluid ascites. No pneumoperitoneum. No hemoperitoneum. No mesenteric hematoma identified. No organized fluid collection. Pelvic Organs: Status post hysterectomy. Bilateral adnexal regions are unremarkable. Likely left ovary visualized (2:96). Right ovary not definitely visualized. Lymph Nodes: No abdominal,  pelvic, inguinal lymphadenopathy. Vasculature: Mild atherosclerotic. No abdominal aorta or iliac aneurysm. No active contrast extravasation or pseudoaneurysm. Musculoskeletal: No significant soft tissue hematoma. No acute pelvic fracture. No spinal fracture. No suspicious lytic or blastic osseous lesions. Intervertebral disc space vacuum phenomenon at the L4-L5 level. IMPRESSION: 1. No findings of primary malignancy or metastatic disease. 2. No acute traumatic injury to the chest, abdomen, or pelvis. 3. No acute fracture or traumatic malalignment of the thoracic or lumbar spine. Other imaging findings of potential clinical significance: 1. Indeterminate 1.1 x 1 x 1.3 cm likely mediastinal hypodensity inferior to the right inferior pulmonary vein. Finding could represent a pericardial cyst or duplication cyst. Recommend attention on follow-up. 2. Several hypodensities within the liver some of which measure fluid density (likely hepatic cysts) and some of which are too small to characterize. 3. Scattered colonic diverticulosis. Electronically Signed   By: Iven Finn M.D.   On: 06/30/2021 00:27    Labs:  Basic Metabolic Panel: Recent Labs  Lab 07/14/21 0519 07/17/21 0525  NA 133* 132*  K 3.9 4.0  CL 102 99  CO2 23 23  GLUCOSE 213* 193*  BUN 32* 29*  CREATININE 0.99 1.10*  CALCIUM 8.4* 8.2*    CBC: Recent Labs  Lab 07/17/21 0525  WBC 8.9  HGB 10.3*  HCT 31.4*  MCV 93.2   PLT 257    CBG: No results for input(s): GLUCAP in the last 168 hours.  Family history.  Mother with diabetes hypertension.  Father with hypertension and leukemia as well as CVA.  Denies any colon cancer esophageal cancer or rectal cancer  Brief HPI:   Denise Wright is a 71 y.o. right-handed female with history of hypertension normocytic anemia hyperlipidemia as well as hypothyroidism.  Per chart review patient lives alone.  1 level home.  Independent prior to admission and working.  Presented 06/29/2021 with altered mental status and complaints of memory and recall issues for approximately 1 to 1-1/2 weeks.  She had reported headache for approximately 2 days.  Denied any trauma or recent fall.  Patient did recently have 5 teeth extracted 06/16/2021 placed on clindamycin.  Cranial CT scan showed poorly visualized 2.3 cm right temporal mass with associated vasogenic edema as well as 4 mm right to left midline shift.  CT of the chest abdomen pelvis showed no findings of primary malignancy or metastatic disease.  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  Central necrosis and mild internal hemorrhage.  There was mass effect with edema and a 5 mm midline shift.  Patient underwent right frontal temporal craniotomy stereotactic for resection of supratentorial intra-axial tumor 07/06/2021 per Dr. Reatha Armour.  Maintained on Keppra for seizure prophylaxis as well as Decadron taper.  She was cleared to begin subcutaneous Lovenox for DVT prophylaxis 07/08/2021.  Therapy evaluations completed due to patient decreased functional mobility altered mental status was admitted for a comprehensive rehab program.   Hospital Course: Osie Merkin Guedes was admitted to rehab 07/12/2021 for inpatient therapies to consist of PT, ST and OT at least three hours five days a week. Past admission physiatrist, therapy team and rehab RN have worked together to provide customized collaborative  inpatient rehab.  Pertaining to patient's supratentorial tumor likely GBM status post right frontal temporal craniotomy resection 07/06/2021.  Surgical site healing nicely patient was to follow-up with Dr. Reatha Armour of neurosurgery as well as Dr. Mickeal Skinner with pathology report positive for glioblastoma.  It  was discussed with patient and daughter regarding full pathology report and treatment pathways recommend proceeding with course of intensity modulated radiation therapy and concurrent daily  temozolomide.  She was completing a slow Decadron taper.  She had been cleared for subcutaneous heparin for DVT prophylaxis 07/08/2021 no bleeding episodes.  Hydrocodone ongoing as needed for headaches.  Mood stabilization with Xanax emotional support provided.  Blood pressure controlled on low-dose HCTZ.  Keppra ongoing for seizure prophylaxis no seizure activity and recommended discontinuation at discharge as patient had no seizures..  Hypothyroidism with hormone supplement as directed.  Normocytic anemia hemoglobin stable 11.2.  Patient did have a recent multiple tooth extraction 06/16/2021 she continued to be maintained on clindamycin as directed.  Patient did have some dysphonia likely related to trauma from intubation continue to be observed and she could follow-up outpatient ENT if needed.   Blood pressures were monitored on TID basis and controlled     Rehab course: During patient's stay in rehab weekly team conferences were held to monitor patient's progress, set goals and discuss barriers to discharge. At admission, patient required minimal assist sit to stand min mod assist 12 feet rolling walker  Physical exam.  Blood pressure 128/69 pulse 73 temperature 98.7 respirations 20 oxygen saturations 98% room air Constitutional.  No acute distress HEENT Head.  Craniotomy site clean and dry Eyes.  Pupils round and reactive to light no discharge without nystagmus Neck.  Supple nontender no JVD without  thyromegaly Cardiac regular rate rhythm without any extra sounds or murmur heard Abdomen.  Soft nontender positive bowel sounds without rebound Respiratory effort normal no respiratory distress without wheeze Skin.  Intact Neurologic.  Alert sitting up in bed makes eye contact with examiner.  Follows simple commands.  Right side strength intact.  Left side strength 4/5.  Sensation intact  He/She  has had improvement in activity tolerance, balance, postural control as well as ability to compensate for deficits. He/She has had improvement in functional use RUE/LUE  and RLE/LLE as well as improvement in awareness.  Patient performs Berg balance test score 34/56.  Sit to stand contact-guard.  Ambulates to the toilet without assistive device minimal assist for stability.  She can ambulate up to 300 feet without assistive device.  Monitoring for left-sided attention.  Patient completes finger palm manipulation of small puzzle pieces and prep for functional task with 4 instances of dropping due to left-sided intention.  Patient completed bed mobility supervision.  Ambulates to the bathroom supervision.  Patient voided bladder minimal assist for clothing management but able to complete PeriCare.  Transferred to shower by stepping over shower and using grab bars contact-guard.  Patient able to grasp washcloth use left upper extremity for bathing tasks with minimal verbal cues.  Dressing task completed seated in wheelchair with minimal assist for lower body dressing.  Speech therapy followed up needed some cues for reasoning while interpreting direction for medication administration from prescription bottles.  It was discussed with family need for supervision assist on discharge.  Full family teaching completed plan discharged to home       Disposition: Discharge to home    Diet: Regular  Special Instructions: No driving smoking or alcohol  Medications at discharge 1.  Tylenol as needed 2.  Xanax 0.25  mg twice daily as needed 3.  Colace 100 mg p.o. twice daily 4.  Pepcid 20 mg p.o. daily 5.  HCTZ 6.25 mg p.o. daily 6.  Hydrocodone 1 tablet every 4 hours as needed pain  7  Synthroid 50 mcg p.o. daily 8.  Multivitamin daily 9.  Decadron 2 mg every 8 hours x3 days then 2 mg twice daily x3 days then 2 mg daily x3 days and stop 10.  Clindamycin 300 mg every 8 hours completed  30-35 minutes were spent completing discharge summary and discharge planning  Discharge Instructions     Ambulatory referral to Occupational Therapy   Complete by: As directed    Eval and treat   Ambulatory referral to Physical Medicine Rehab   Complete by: As directed    Moderate complexity follow-up 1 to 2 weeks brain tumor/GBM   Ambulatory referral to Physical Therapy   Complete by: As directed    Eval and treat   Ambulatory referral to Speech Therapy   Complete by: As directed    Eval and treat        Follow-up Information     Meredith Staggers, MD Follow up.   Specialty: Physical Medicine and Rehabilitation Why: Office to call for appointment Contact information: [place] [address] [phone]         Dawley, Theodoro Doing, DO Follow up.   Why: Call for appointment Contact information: [place] [address] [phone]         Ventura Sellers, MD Follow up.   Specialties: Psychiatry, Neurology, Oncology Why: Call for appointment Contact information: [place] [address] [phone]                 Signed: Cathlyn Parsons 07/20/2021, 6:19 AM

## 2021-07-19 NOTE — Progress Notes (Signed)
Physical Therapy Session Note  Patient Details  Name: Denise Wright MRN: [id number] Date of Birth: 1950-06-23  Today's Date: 07/19/2021 PT Individual Time: 0923-3007 PT Individual Time Calculation (min): 45 min   Short Term Goals: Week 1:  PT Short Term Goal 1 (Week 1): Pt will perform supine to sit with minA consistently. PT Short Term Goal 2 (Week 1): Pt will perform bed to chair consistently with CGA. PT Short Term Goal 3 (Week 1): Pt will ambulate x150' with CGA and LRAD. PT Short Term Goal 4 (Week 1): Pt will perform x6 steps with R hand rail and minA.  Skilled Therapeutic Interventions/Progress Updates:    Patient received sitting up in recliner, agreeable to PT. She denies pain. Patient with questions regarding apps for ipad for cognition. PT relaying that they can be beneficial in moderation to prevent eye and mental fatigue. Patient verbalizing understanding. Patient ambulating ~172ft to 5th floor therapy gym with RW and CGA. Verbal cues for increased gait speed and fluidity of steps needed. Patient completing 10 mins on NuStep with goal of >50steps/min for improved reciprocal coordinated movement at faster speed. Patient ambulating back to her room with slightly faster gait speed noted. Upon sitting in recliner, patient reporting that she felt as though her brief were wet. Supervision for ambulatory transfer to toilet. Patient found to have been incontinent with further continent void. Supervision for perihygiene and clothing management. Patient returning to recliner, needs within reach.   Therapy Documentation Precautions:  Precautions Precautions: Fall Precaution Comments: L inattention Restrictions Weight Bearing Restrictions: No    Therapy/Group: Individual Therapy  Karoline Caldwell, PT, DPT, CBIS  07/19/2021, 7:51 AM

## 2021-07-19 NOTE — Progress Notes (Signed)
Occupational Therapy Session Note  Patient Details  Name: Denise Wright MRN: [id number] Date of Birth: [birth year]  Today's Date: 07/19/2021 OT Individual Time: 0810-0910 OT Individual Time Calculation (min): 60 min    Short Term Goals: Week 1:  OT Short Term Goal 1 (Week 1): Patient will complete LB dressing with min A OT Short Term Goal 2 (Week 1): Patient will tolerate standing at the sink for 3 minutes in preparation for BADL task. OT Short Term Goal 3 (Week 1): Patient will use L UE to apply deodorant with CGA.  Skilled Therapeutic Interventions/Progress Updates:     Pt received in bed  with no pain reported  ADL: Pt completes ADL at overall S-MIN A level requiring increased time for processing and intiation. Skilled interventions include: VC for hand placement during transitional movements, VC/for maintaining fig 4 positioning, stanidng at sink for UB/grooming ADLs for standing balance/tolerance, and figure 4 wrapping of BLE for edema management. Pt demo good carryover lf LE dressing strategies but continues to be limited by edema and LLE/UE strength to maintain positioning. Pt educated on edema management strategies/positioning in bed for nap between session to elevate Les on pillow. Pt continues to require VC for orientation of LB clothing and MIN A for donning non skid socks. Pt would benefit from practicing donning compression socks in prep for DC home  Pt left at end of session in bed with exit alarm on, call light in reach and all needs met   Therapy Documentation Precautions:  Precautions Precautions: Fall Precaution Comments: L inattention Restrictions Weight Bearing Restrictions: No General:      Therapy/Group: Individual Therapy  Tonny Branch 07/19/2021, 6:44 AM

## 2021-07-20 ENCOUNTER — Telehealth: Payer: Self-pay | Admitting: Radiation Oncology

## 2021-07-20 ENCOUNTER — Other Ambulatory Visit: Payer: Self-pay | Admitting: Radiation Therapy

## 2021-07-20 ENCOUNTER — Inpatient Hospital Stay (HOSPITAL_BASED_OUTPATIENT_CLINIC_OR_DEPARTMENT_OTHER): Payer: BC Managed Care – PPO | Admitting: Internal Medicine

## 2021-07-20 DIAGNOSIS — C712 Malignant neoplasm of temporal lobe: Secondary | ICD-10-CM | POA: Diagnosis not present

## 2021-07-20 DIAGNOSIS — Z90710 Acquired absence of both cervix and uterus: Secondary | ICD-10-CM | POA: Diagnosis not present

## 2021-07-20 DIAGNOSIS — C719 Malignant neoplasm of brain, unspecified: Secondary | ICD-10-CM

## 2021-07-20 DIAGNOSIS — I10 Essential (primary) hypertension: Secondary | ICD-10-CM

## 2021-07-20 DIAGNOSIS — Z806 Family history of leukemia: Secondary | ICD-10-CM | POA: Diagnosis not present

## 2021-07-20 DIAGNOSIS — Z7189 Other specified counseling: Secondary | ICD-10-CM | POA: Insufficient documentation

## 2021-07-20 LAB — BASIC METABOLIC PANEL
Anion gap: 7 (ref 5–15)
BUN: 23 mg/dL (ref 8–23)
CO2: 27 mmol/L (ref 22–32)
Calcium: 8.8 mg/dL — ABNORMAL LOW (ref 8.9–10.3)
Chloride: 102 mmol/L (ref 98–111)
Creatinine, Ser: 1.11 mg/dL — ABNORMAL HIGH (ref 0.44–1.00)
GFR, Estimated: 54 mL/min — ABNORMAL LOW (ref >60)
Glucose, Bld: 150 mg/dL — ABNORMAL HIGH (ref 70–99)
Potassium: 4.6 mmol/L (ref 3.5–5.1)
Sodium: 136 mmol/L (ref 135–145)

## 2021-07-20 NOTE — Progress Notes (Signed)
Patient ID: Denise Wright, female   DOB: February 11, 1951, 71 y.o.   MRN: [id number]   The patient's right frontotemporal craniotomy staples were removed without any complications or distress to the patient. The incision is CDI, and well approximated. No complicating features appreciated.   Marvis Moeller, DNP, NP-C Neurosurgery  07/20/2021 11:11 AM

## 2021-07-20 NOTE — Progress Notes (Signed)
Occupational Therapy Session Note  Patient Details  Name: Denise Wright MRN: [id number] Date of Birth: [birth year]  Today's Date: 07/20/2021 OT Individual Time: 8177-1165 OT Individual Time Calculation (min): 71 min    Short Term Goals: Week 1:  OT Short Term Goal 1 (Week 1): Patient will complete LB dressing with min A OT Short Term Goal 2 (Week 1): Patient will tolerate standing at the sink for 3 minutes in preparation for BADL task. OT Short Term Goal 3 (Week 1): Patient will use L UE to apply deodorant with CGA.  Skilled Therapeutic Interventions/Progress Updates:    Pt greeted semi-reclined in bed with friend Doylene Bode present. Pt agreeable to shower this morning. She completed bed mobility with increased time and supervision. SiT<>stand w/ RW and verbal cues for hand placement to stand. Supervision to the bathroom where pt transitioned to the [facility] over toilet. Pt voided bowel and bladder, but needed cues to attend to toileting task due to attention deficits and being distracted by conversation with friend in the room. Verbal cues to initiate peri-care after void. Pt then transitioned into shower stepping over ledge and using grab bar with close supervision. Bathing sit<>stand using grab bars and LH sponge with overall close supervision and min cues to recall which body parts she had already washed. Pt transferred out of shower with CGA to step over ledge, then stood to brush teeth in standing with supervision and min cues to locate grooming items on L side. Dressing tasks completed sit<>stand from recliner with min cues for hemi -dressing techniques. OT educated on friction reducing bag to don TED hose. Pt then set-up for breakfast and left seated in recliner with chair alarm on, call bell in reach, family present, and needs met.   Therapy Documentation Precautions:  Precautions Precautions: Fall Precaution Comments: L inattention Restrictions Weight Bearing Restrictions: No Pain:  Denies  pain   Therapy/Group: Individual Therapy  Valma Cava 07/20/2021, 8:51 AM

## 2021-07-20 NOTE — Telephone Encounter (Signed)
Called daughter to schedule the patient for a consultation w. Dr. Lisbeth Renshaw. No answer, LVM for a return call.

## 2021-07-20 NOTE — Progress Notes (Signed)
[facility] [place], [place] [id number] [phone]   New Patient Evaluation  Date of Service: 07/20/21 Patient Name: Denise Wright Patient MRN: [id number] Patient DOB: [birth year] Provider: Ventura Sellers, MD  Identifying Statement:  Denise Wright is a 71 y.o. female with right temporal glioblastoma who presents for initial consultation and evaluation.    Referring Provider: Willey Blade, MD [place],  [place] [id number]  Oncologic History: Oncology History  Glioblastoma with isocitrate dehydrogenase gene wildtype [facility])  07/06/2021 Surgery   Right temporal craniotomy, resection with Dr. Reatha Armour; path is glioblastoma IDH-1wt   07/12/2021 Initial Diagnosis   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[facility])     Biomarkers:  MGMT Unknown.  IDH 1/2 Wild type.  EGFR Unknown  TERT Unknown   History of Present Illness: The patient's records from the referring physician were obtained and reviewed and the patient interviewed to confirm this HPI.  Due to urgency of condition and formulation of treatment plan, this visit occurred while patient was inpatient at rehab facility.  Denise Wright presented to medical attention last month with several days, weeks of progressive mental status changes.  Family noticed she was confused and disoriented over normal conversation and activities of daily living.  Baseline she is functionally independent, lives alone, works full time with Museum/gallery exhibitions officer company.  CNS imaging demonstrated an enhancing mass in the right temporal lobe; she underwent craniotomy, resection with Dr. Reatha Armour on 07/06/21, which demonstrated glioblastoma.  Following surgery she experienced worsening left sided weakness, which prompted rehabilitation admission.  At present, she is doing some walking on her own, albeit slowly.  Left arm and hand have improved, she is starting  occupational therapy this week.  Medications: Current Facility-Administered Medications on File Prior to Visit  Medication Dose Route Frequency Provider Last Rate Last Admin   acetaminophen (TYLENOL) tablet 650 mg  650 mg Oral Q6H PRN Cathlyn Parsons, PA-C   650 mg at 07/20/21 1058   Or   acetaminophen (TYLENOL) suppository 650 mg  650 mg Rectal Q6H PRN Angiulli, Lavon Paganini, PA-C       ALPRAZolam Duanne Moron) tablet 0.25 mg  0.25 mg Oral BID PRN Cathlyn Parsons, PA-C   0.25 mg at 07/18/21 2209   clindamycin (CLEOCIN) capsule 300 mg  300 mg Oral Q8H AngiulliLavon Paganini, PA-C   300 mg at 07/20/21 4982   dexamethasone (DECADRON) tablet 2 mg  2 mg Oral Q8H Meredith Staggers, MD   2 mg at 07/20/21 6415   docusate sodium (COLACE) capsule 100 mg  100 mg Oral BID Cathlyn Parsons, PA-C   100 mg at 07/20/21 8309   famotidine (PEPCID) tablet 20 mg  20 mg Oral Daily Cathlyn Parsons, PA-C   20 mg at 07/20/21 4076   heparin injection 5,000 Units  5,000 Units Subcutaneous Q8H Cathlyn Parsons, PA-C   5,000 Units at 07/20/21 8088   hydrochlorothiazide (HYDRODIURIL) tablet 6.25 mg  6.25 mg Oral Daily Izora Ribas, MD   6.25 mg at 07/20/21 1103   HYDROcodone-acetaminophen (NORCO/VICODIN) 5-325 MG per tablet 1 tablet  1 tablet Oral Q4H PRN Cathlyn Parsons, PA-C   1 tablet at 07/18/21 2209   levETIRAcetam (KEPPRA) tablet 500 mg  500 mg Oral BID Cathlyn Parsons, PA-C   500 mg at 07/20/21 1594   levothyroxine (SYNTHROID) tablet 50 mcg  50 mcg Oral QAC breakfast  Cathlyn Parsons, PA-C   50 mcg at 07/20/21 0277   multivitamin with minerals tablet 1 tablet  1 tablet Oral Daily Cathlyn Parsons, PA-C   1 tablet at 07/20/21 4128   ondansetron (ZOFRAN) tablet 4 mg  4 mg Oral Q4H PRN AngiulliLavon Paganini, PA-C       Or   ondansetron [facility]) injection 4 mg  4 mg Intravenous Q4H PRN Angiulli, Lavon Paganini, PA-C       pantoprazole (PROTONIX) EC tablet 40 mg  40 mg Oral QHS Cathlyn Parsons, PA-C   40 mg at  07/19/21 2115   protein supplement (ENSURE MAX) liquid  11 oz Oral Daily Meredith Staggers, MD   11 oz at 07/20/21 7867   Current Outpatient Medications on File Prior to Visit  Medication Sig Dispense Refill   b complex vitamins capsule Take 1 capsule by mouth daily.     chlorhexidine (PERIDEX) 0.12 % solution 15 mLs by Mouth Rinse route 2 (two) times daily.     cholecalciferol (VITAMIN D) 1000 UNITS tablet Take 2,000 Units by mouth daily.     clindamycin (CLEOCIN) 300 MG capsule Take 300 mg by mouth every 8 (eight) hours.     fluticasone (FLONASE) 50 MCG/ACT nasal spray Place 1 spray into both nostrils daily as needed for allergies. OTC     hydrochlorothiazide (HYDRODIURIL) 25 MG tablet Take 25 mg by mouth daily.     ibuprofen (ADVIL) 400 MG tablet Take 400 mg by mouth every 4 (four) hours as needed for pain.     levothyroxine (SYNTHROID) 50 MCG tablet Take 50 mcg by mouth daily.     Multiple Vitamins-Minerals (MULTIVITAMIN WITH MINERALS) tablet Take 1 tablet by mouth daily.      Allergies:  Allergies  Allergen Reactions   Fexofenadine Hives   Penicillins Itching and Other (See Comments)    Pulling sensation in body   Past Medical History:  Past Medical History:  Diagnosis Date   Hyperlipidemia    Hypertension    Pneumonia    Thyroid disease    Past Surgical History:  Past Surgical History:  Procedure Laterality Date   ABDOMINAL HYSTERECTOMY  6720   APPLICATION OF CRANIAL NAVIGATION Right 07/06/2021   Procedure: APPLICATION OF CRANIAL NAVIGATION;  Surgeon: Karsten Ro, DO;  Location: [facility];  Service: Neurosurgery;  Laterality: Right;   CRANIOTOMY Right 07/06/2021   Procedure: CRANIOTOMY TUMOR EXCISION;  Surgeon: Karsten Ro, DO;  Location: [facility];  Service: Neurosurgery;  Laterality: Right;   TONSILLECTOMY  1965   TOOTH EXTRACTION     Social History:  Social History   Socioeconomic History   Marital status: Divorced    Spouse name: Not on file   Number of children:  3   Years of education: 20   Highest education level: Not on file  Occupational History   Occupation: Masters degree in Psychologist, counselling: [employer]   Occupation: Working on Engineer, maintenance   Occupation: Training and development officer of [employer]  Tobacco Use   Smoking status: Never   Smokeless tobacco: Never  Vaping Use   Vaping Use: Never used  Substance and Sexual Activity   Alcohol use: No   Drug use: No   Sexual activity: Yes    Birth control/protection: Surgical  Other Topics Concern   Not on file  Social History Narrative   Divorced, lives in [place] alone.  Independent of ADLs.  Social Determinants of Health   Financial Resource Strain: Not on file  Food Insecurity: Not on file  Transportation Needs: Not on file  Physical Activity: Not on file  Stress: Not on file  Social Connections: Not on file  Intimate Partner Violence: Not on file   Family History:  Family History  Problem Relation Age of Onset   Diabetes Mother    Hypertension Mother    Hypertension Father    Leukemia Father    Stroke Father    Cancer Brother    Pneumonia Brother     Review of Systems: Constitutional: Doesn't report fevers, chills or abnormal weight loss Eyes: Doesn't report blurriness of vision Ears, nose, mouth, throat, and face: Doesn't report sore throat Respiratory: Doesn't report cough, dyspnea or wheezes Cardiovascular: Doesn't report palpitation, chest discomfort  Gastrointestinal:  Doesn't report nausea, constipation, diarrhea GU: Doesn't report incontinence Skin: Doesn't report skin rashes Neurological: Per HPI Musculoskeletal: Doesn't report joint pain Behavioral/Psych: Doesn't report anxiety  Physical Exam: Wt Readings from Last 3 Encounters:  07/19/21 218 lb 11.1 oz (99.2 kg)  07/06/21 206 lb 2.1 oz (93.5 kg)  09/01/20 210 lb (95.3 kg)   Temp Readings from Last 3 Encounters:  07/19/21 98.1 F (36.7 C) (Oral)   07/12/21 98.8 F (37.1 C)  09/01/20 98 F (36.7 C)   BP Readings from Last 3 Encounters:  07/19/21 123/80  07/12/21 128/69  09/01/20 135/75   Pulse Readings from Last 3 Encounters:  07/19/21 88  07/12/21 73  09/01/20 75   KPS: 70. General: Alert, cooperative, pleasant, in no acute distress Head: Normal EENT: No conjunctival injection or scleral icterus.  Lungs: Resp effort normal Cardiac: Regular rate Abdomen: Non-distended abdomen Skin: No rashes cyanosis or petechiae. Extremities: No clubbing or edema  Neurologic Exam: Mental Status: Awake, alert, attentive to examiner. Oriented to self and environment. Language is fluent with intact comprehension.  Cranial Nerves: Visual acuity is grossly normal. Visual fields are full. Extra-ocular movements intact. No ptosis. Face is symmetric Motor: Tone and bulk are normal. Power is 4+/5 in left arm and leg. Reflexes are symmetric, no pathologic reflexes present.  Sensory: Intact to light touch Gait: Deferred   Labs: I have reviewed the data as listed    Component Value Date/Time   NA 136 07/20/2021 0603   K 4.6 07/20/2021 0603   CL 102 07/20/2021 0603   CO2 27 07/20/2021 0603   GLUCOSE 150 (H) 07/20/2021 0603   BUN 23 07/20/2021 0603   CREATININE 1.11 (H) 07/20/2021 0603   CALCIUM 8.8 (L) 07/20/2021 0603   PROT 5.5 (L) 07/13/2021 0525   ALBUMIN 2.3 (L) 07/13/2021 0525   AST 14 (L) 07/13/2021 0525   ALT 36 07/13/2021 0525   ALKPHOS 74 07/13/2021 0525   BILITOT 0.3 07/13/2021 0525   GFRNONAA 54 (L) 07/20/2021 0603   GFRAA 70 (L) 07/17/2011 0311   Lab Results  Component Value Date   WBC 8.9 07/17/2021   NEUTROABS 10.4 (H) 07/13/2021   HGB 10.3 (L) 07/17/2021   HCT 31.4 (L) 07/17/2021   MCV 93.2 07/17/2021   PLT 257 07/17/2021    Imaging:  CT Head Wo Contrast  Result Date: 06/29/2021 CLINICAL DATA:  t c/o fatigue AND feeling "dis-combobulated" since oral surgery on 06/16/2021. Pt reports she fell on Christmas  night when she tripped over a box of fruit on the floor but did not hit head. EXAM: CT HEAD WITHOUT CONTRAST TECHNIQUE: Contiguous axial images were obtained from the  base of the skull through the vertex without intravenous contrast. RADIATION DOSE REDUCTION: This exam was performed according to the departmental dose-optimization program which includes automated exposure control, adjustment of the mA and/or kV according to patient size and/or use of iterative reconstruction technique. COMPARISON:  None. FINDINGS: Brain: No evidence of large-territorial acute infarction. No parenchymal hemorrhage. Poorly visualized 2.3 cm right temporal mass with associated vasogenic edema. No extra-axial collection. 4 mm right to left midline shift. Partial effacement of the right frontal and temporal sulci. No hydrocephalus. Basilar cisterns are patent. Vascular: No hyperdense vessel. Skull: No acute fracture or focal lesion. Sinuses/Orbits: Paranasal sinuses and mastoid air cells are clear. The orbits are unremarkable. Other: None. IMPRESSION: Poorly visualized 2.3 cm right temporal mass with associated vasogenic edema as well as 4 mm right to left midline shift. Finding consistent with malignancy. Recommend MRI brain with and without contrast for further evaluation. Electronically Signed   By: Iven Finn M.D.   On: 06/29/2021 22:55   MR BRAIN W WO CONTRAST  Result Date: 07/07/2021 CLINICAL DATA:  Brain/CNS neoplasm, staging postop EXAM: MRI HEAD WITHOUT AND WITH CONTRAST TECHNIQUE: Multiplanar, multiecho pulse sequences of the brain and surrounding structures were obtained without and with intravenous contrast. CONTRAST:  9.14m GADAVIST GADOBUTROL 1 MMOL/ML IV SOLN COMPARISON:  Preoperative study 06/30/2021 FINDINGS: Brain: Postoperative changes of frontotemporal mass resection. Fluid, blood products, and air present within the resection cavity. Extra-axial collection is present underlying the craniotomy. There is  bifrontal extra-axial air. Intrinsic T1 hyperintense blood products are present. There is some superimposed enhancement at the resection cavity margins that is likely postoperative. Mild nodularity is present inferiorly (series 23, image 18). Remains surrounding T2 FLAIR hyperintensity similar in extent to the preoperative study. Mass effect remains present including effacement the right lateral ventricle and mild leftward midline shift. There is no hydrocephalus. Vascular: Major vessel flow voids at the skull base are preserved. Skull and upper cervical spine: Normal marrow signal is preserved. Sinuses/Orbits: Minor mucosal thickening.  Orbits are unremarkable. Other: Sella is unremarkable.  Mastoid air cells are clear. IMPRESSION: Expected postoperative changes of gross total resection of right frontotemporal mass. Attention on follow-up recommended for mildly nodular enhancement at the inferior margin. Persistent mass effect with mild leftward midline shift. Electronically Signed   By: PMacy MisM.D.   On: 07/07/2021 16:41   MR Brain W and Wo Contrast  Result Date: 06/30/2021 CLINICAL DATA:  Brain mass. EXAM: MRI HEAD WITHOUT AND WITH CONTRAST TECHNIQUE: Multiplanar, multiecho pulse sequences of the brain and surrounding structures were obtained without and with intravenous contrast. CONTRAST:  9.574mGADAVIST GADOBUTROL 1 MMOL/ML IV SOLN COMPARISON:  CT head 06/29/2021. FINDINGS: Brain: Large mass lesion right temporal lobe. The mass shows irregular peripheral enhancement and central necrosis. Mild internal hemorrhage is noted. Surrounding nonenhancing edema is noted. The mass measures approximately 4.5 x 5.7 x 4.5 cm. There is mass-effect with 5 mm midline shift to the right. No second lesion identified Mild patchy white matter hyperintensities bilaterally most likely chronic ischemia. No acute infarct. Vascular: Normal arterial flow voids. Skull and upper cervical spine: No focal skeletal lesion.  Sinuses/Orbits: Mucosal edema and air-fluid level right maxillary sinus. Negative orbit Other: None IMPRESSION: Large enhancing mass lesion in the right temporal lobe. Central necrosis and mild internal hemorrhage. 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 There is mass-effect with edema and 5 mm midline shift. Electronically Signed   By: ChFranchot Gallo.D.   On: 06/30/2021 18:00  CT CHEST ABDOMEN PELVIS W CONTRAST  Result Date: 06/30/2021 CLINICAL DATA:  Brain metastases, unknown primary. "Pt c/o fatigue AND feeling "dis-combobulated" since oral surgery on 06/16/2021. Pt reports she fell on Christmas night when she tripped over a box of fruit on the floor but did not hit head. EXAM: CT CHEST, ABDOMEN, AND PELVIS WITH CONTRAST TECHNIQUE: Multidetector CT imaging of the chest, abdomen and pelvis was performed following the standard protocol during bolus administration of intravenous contrast. RADIATION DOSE REDUCTION: This exam was performed according to the departmental dose-optimization program which includes automated exposure control, adjustment of the mA and/or kV according to patient size and/or use of iterative reconstruction technique. CONTRAST:  135m OMNIPAQUE IOHEXOL 300 MG/ML  SOLN COMPARISON:  None. FINDINGS: CHEST: Ports and Devices: None. Lungs/airways: No focal consolidation. No pulmonary nodule. No pulmonary mass. No pulmonary contusion or laceration. No pneumatocele formation. The central airways are patent. Pleura: No pleural effusion. No pneumothorax. No hemothorax. Lymph Nodes: No mediastinal, hilar, or axillary lymphadenopathy. Mediastinum: There is a 1.1 x 1 x 1.3 cm hypodensity within the mediastinum inferior to the right inferior pulmonary vein (2:35) no pneumomediastinum. No aortic injury or mediastinal hematoma. The thoracic aorta is normal in caliber. The heart is normal in size. No significant pericardial effusion. The esophagus is unremarkable.  Possible tiny hiatal hernia. The thyroid  is unremarkable. The main pulmonary artery is normal in caliber. No central or proximal segmental pulmonary embolus. Chest Wall / Breasts: No chest wall mass. Musculoskeletal: No acute rib or sternal fracture. No spinal fracture. No suspicious lytic or blastic osseous lesion. Densely sclerotic T6 vertebral body lesion likely represents a bone island (6:84). ABDOMEN / PELVIS: Liver: Not enlarged. There is a 1.5 x 1 cm fluid density lesion within the right hepatic lobe (2:51) as well as another measuring 1.6 x 0 1 cm (2:49). Adjacent associated subcentimeter hypodensity (2:51). Couple of other subcentimeter hypodensities noted throughout the liver (2:35, 42). No laceration or subcapsular hematoma. Biliary System: The gallbladder is otherwise unremarkable with no radio-opaque gallstones. No biliary ductal dilatation. Pancreas: Normal pancreatic contour. No main pancreatic duct dilatation. Spleen: Not enlarged. Single punctate hypodensity too small to characterize. Otherwise no focal lesion. No laceration, subcapsular hematoma, or vascular injury. Adrenal Glands: No nodularity bilaterally. Kidneys: Bilateral kidneys enhance symmetrically. No hydronephrosis. No contusion, laceration, or subcapsular hematoma. Punctate hypodensities too small to characterize. No injury to the vascular structures or collecting systems. No hydroureter. The urinary bladder is unremarkable. Bowel: No small or large bowel wall thickening or dilatation. Scattered colonic diverticulosis. The appendix is normal. Mesentery, Omentum, and Peritoneum: No simple free fluid ascites. No pneumoperitoneum. No hemoperitoneum. No mesenteric hematoma identified. No organized fluid collection. Pelvic Organs: Status post hysterectomy. Bilateral adnexal regions are unremarkable. Likely left ovary visualized (2:96). Right ovary not definitely visualized. Lymph Nodes: No abdominal, pelvic, inguinal lymphadenopathy. Vasculature: Mild atherosclerotic. No abdominal  aorta or iliac aneurysm. No active contrast extravasation or pseudoaneurysm. Musculoskeletal: No significant soft tissue hematoma. No acute pelvic fracture. No spinal fracture. No suspicious lytic or blastic osseous lesions. Intervertebral disc space vacuum phenomenon at the L4-L5 level. IMPRESSION: 1. No findings of primary malignancy or metastatic disease. 2. No acute traumatic injury to the chest, abdomen, or pelvis. 3. No acute fracture or traumatic malalignment of the thoracic or lumbar spine. Other imaging findings of potential clinical significance: 1. Indeterminate 1.1 x 1 x 1.3 cm likely mediastinal hypodensity inferior to the right inferior pulmonary vein. Finding could represent a pericardial cyst or  duplication cyst. Recommend attention on follow-up. 2. Several hypodensities within the liver some of which measure fluid density (likely hepatic cysts) and some of which are too small to characterize. 3. Scattered colonic diverticulosis. Electronically Signed   By: Iven Finn M.D.   On: 06/30/2021 00:27    Pathology: SURGICAL PATHOLOGY  * THIS IS AN AMENDED REPORT *  CASE: [id number]  PATIENT: Darryl Lent  Surgical Pathology Report  * Amendment *   Reason for Amendment #1: Outside consultation   Clinical History: Brain tumor (ms)    FINAL MICROSCOPIC DIAGNOSIS:   A and B. BRAIN, RIGHT TEMPORAL , RESECTION:  -  Glioblastoma, IDH-wild-type, WHO grade 4  -  See comment   COMMENT:   This case has been sent to [facility] (prior pathology) for  additional work-up.  Once complete, an addendum will be issued.   AMENDMENT NOTE: DIAGNOSTIC INFORMATION HAS NOT BEEN CHANGED.  The  original report indicated a preliminary diagnosis of "high-grade glioma;  pending outside consultation"; this has been amended to "glioblastoma,  IDH wild-type WHO grade 4".  A copy of the consult will be scanned into  epic for review.   Consultation note (written by Dr. 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  Immunohistochemical stains performed at [facility] show the  tumor cells are positive for GFAP and Olig2.  IDH1 (p.  R132H) mutant  protein is negative.  ATRX expression is retained and p53 labels a  subset of tumor cells.  The Ki-67 labeling index is high.  Synaptophysin  highlights entrapped axons.    Assessment/Plan 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HCC)  Goals of care, counseling/discussion  We appreciate the opportunity to participate in the care of Denise Wright.  She presents with clinical and radiographic syndrome consistent with right temporal glioblastoma, IDHwt.  She exhibits motor deficits, but these are improving with aggressive physical therapy.    We had an extensive conversation with her and her daughter regarding pathology, prognosis, and available treatment pathways.  They would like to move forward with standard treatments available.  We ultimately recommended proceeding with course of intensity modulated radiation therapy and concurrent daily Temozolomide.  Radiation will be administered Mon-Fri over 6 weeks, Temodar will be dosed at 23m/m2 to be given daily over 42 days.  We reviewed side effects of temodar, including fatigue, nausea/vomiting, constipation, and cytopenias.  Informed consent was verbally obtained at bedside to proceed with oral chemotherapy.  Chemotherapy should be held for the following:  ANC less than 1,000  Platelets less than 100,000  LFT or creatinine greater than 2x ULN  If clinical concerns/contraindications develop  Every 2 weeks during radiation, labs will be checked accompanied by a clinical evaluation in the brain tumor clinic.  Screening for potential clinical trials was performed and discussed using eligibility criteria for active protocols [facility], as well as national database available on [email or web address]     The patient may be a candidate for Ramipril cognitive research protocol at this time; further discussion will occur with the research team.  Decadron taper will continue according to post-op taper plan; Keppra may be discontinued upon discharge given lack of seizures thus far.   We spent twenty additional minutes teaching regarding the natural history, biology, and historical experience in the treatment of brain tumors. We then discussed in detail the current recommendations for therapy  focusing on the mode of administration, mechanism of action, anticipated toxicities, and quality of life issues associated with this plan. We also provided teaching sheets for the patient to take home as an additional resource.  We will arrange visit again in the clinic prior to onset of radiation, to continue discussion of treatment plan and goals of care.  All questions were answered. The patient knows to call the clinic with any problems, questions or concerns. No barriers to learning were detected.  The total time spent in the encounter was 60 minutes and more than 50% was on counseling and review of test results   Ventura Sellers, MD Medical Director of Neuro-Oncology [facility] [place] 07/20/21 3:03 PM

## 2021-07-20 NOTE — Telephone Encounter (Signed)
Called patient to schedule a consultation w. Dr. Moody. No answer, LVM for a return call.  

## 2021-07-20 NOTE — Progress Notes (Signed)
PROGRESS NOTE   Subjective/Complaints: Pt without new issues. Up in BR with OT. Woke up a little earlier than she would have liked this morning  ROS: Patient denies fever, rash, sore throat, blurred vision, dizziness, nausea, vomiting, diarrhea, cough, shortness of breath or chest pain, joint or back/neck pain, or mood change.     Objective:   No results found. No results for input(s): WBC, HGB, HCT, PLT in the last 72 hours.  Recent Labs    07/20/21 0603  NA 136  K 4.6  CL 102  CO2 27  GLUCOSE 150*  BUN 23  CREATININE 1.11*  CALCIUM 8.8*    Intake/Output Summary (Last 24 hours) at 07/20/2021 1009 Last data filed at 07/19/2021 1755 Gross per 24 hour  Intake 472 ml  Output 2 ml  Net 470 ml        Physical Exam: Vital Signs Blood pressure 123/80, pulse 88, temperature 98.1 F (36.7 C), temperature source Oral, resp. rate 20, height 5' 2.01" (1.575 m), weight 99.2 kg, SpO2 100 %.  Constitutional: No distress . Vital signs reviewed. HEENT: NCAT, EOMI, oral membranes moist Neck: supple Cardiovascular: RRR without murmur. No JVD    Respiratory/Chest: CTA Bilaterally without wheezes or rales. Normal effort    GI/Abdomen: BS +, non-tender, non-distended Ext: no clubbing, cyanosis, tr LE edema Psych: pleasant and cooperative   Skin: scalp incision cdi with staples  Neuro:  Alert and oriented x 3. Fair insight and awareness. Intact Memory. Normal language and speech. Cranial nerve exam unremarkable. RUE and RLE 4-5/5. LUE 4-, LLE 3+ to 4-. Sensed LT/PP on all 4's. DTR's 1+--neuro exam stable to improved Musculoskeletal: Full ROM, No pain with AROM or PROM in the neck, trunk, or extremities. Posture appropriate     Assessment/Plan: 1. Functional deficits which require 3+ hours per day of interdisciplinary therapy in a comprehensive inpatient rehab setting. Physiatrist is providing close team supervision and 24 hour  management of active medical problems listed below. Physiatrist and rehab team continue to assess barriers to discharge/monitor patient progress toward functional and medical goals  Care Tool:  Bathing    Body parts bathed by patient: Left arm, Chest, Abdomen, Front perineal area, Right upper leg, Left upper leg, Face   Body parts bathed by helper: Buttocks     Bathing assist Assist Level: Moderate Assistance - Patient 50 - 74%     Upper Body Dressing/Undressing Upper body dressing   What is the patient wearing?: Pull over shirt    Upper body assist Assist Level: Moderate Assistance - Patient 50 - 74%    Lower Body Dressing/Undressing Lower body dressing      What is the patient wearing?: Pants, Incontinence brief     Lower body assist Assist for lower body dressing: Moderate Assistance - Patient 50 - 74%     Toileting Toileting    Toileting assist Assist for toileting: Moderate Assistance - Patient 50 - 74%     Transfers Chair/bed transfer  Transfers assist     Chair/bed transfer assist level: Minimal Assistance - Patient > 75%     Locomotion Ambulation   Ambulation assist      Assist  level: Minimal Assistance - Patient > 75% Assistive device: Walker-rolling Max distance: 100'   Walk 10 feet activity   Assist     Assist level: Minimal Assistance - Patient > 75% Assistive device: Walker-rolling   Walk 50 feet activity   Assist    Assist level: Minimal Assistance - Patient > 75% Assistive device: Walker-rolling    Walk 150 feet activity   Assist Walk 150 feet activity did not occur: Safety/medical concerns         Walk 10 feet on uneven surface  activity   Assist     Assist level: Minimal Assistance - Patient > 75% Assistive device: Walker-rolling   Wheelchair     Assist Is the patient using a wheelchair?: No             Wheelchair 50 feet with 2 turns activity    Assist            Wheelchair 150  feet activity     Assist          Blood pressure 123/80, pulse 88, temperature 98.1 F (36.7 C), temperature source Oral, resp. rate 20, height 5' 2.01" (1.575 m), weight 99.2 kg, SpO2 100 %.  Medical Problem List and Plan: 1. 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   -Slow Decadron taper -Dr. Mickeal Skinner will see her here or reschedule her 2/9 appt              -patient may shower but incision must be covered.              -ELOS/Goals: 2/11, goals S          -Continue CIR therapies including PT, OT, and SLP  2.  Antithrombotics: -DVT/anticoagulation:  Pharmaceutical: Heparin initiated 07/08/2021             -antiplatelet therapy: N/A 3. Pain Management: Hydrocodone as needed for headaches 4. Mood: Xanax 0.25 mg twice daily as needed             -antipsychotic agents: N/A 5. Neuropsych: This patient is capable of making decisions on her own behalf. 6. Skin/Wound Care: remove staples today 7. Fluids/Electrolytes/Nutrition: encourage intake  -protein supp has been for low albumin      8.  Seizure prophylaxis.  continue Keppra 500 mg twice daily 9.  Hypertension/lower ext edema.    -low dose hctz resumed -decreased decadron to 2mg  q8. That should help bp  -  ACE wraps of legs at night helpful for edema 10.  Hypothyroidism.  Synthroid 11.  Normocytic anemia.  hgb stable at 11.2 12.  Recent multiple teeth extraction 06/16/2021.  Continue Clindamycin 300 mg every 8 hours 13. AKI: BUN/Cr   32/1.05-->still 32/0.99 2/3  -stll present  -pushing fluids  -HCTZ resumed for LE edema  -BUN/Cr improved to near normal 2/9 14. Leukocytosis: down to 8.9    -likely steroid effect--discussed that with patient 15. Dysphonia:  -likely d/t VC trauma from intubation  -continue to monitor, if persistent make ENT referral as outpt    LOS: 8 days A FACE TO FACE EVALUATION WAS PERFORMED  Meredith Staggers 07/20/2021, 10:09 AM

## 2021-07-20 NOTE — Progress Notes (Signed)
Occupational Therapy Session Note  Patient Details  Name: Denise Wright MRN: [id number] Date of Birth: Feb 26, 1951  Today's Date: 07/20/2021 OT Individual Time: 1105-1205 OT Individual Time Calculation (min): 60 min    Short Term Goals: Week 1:  OT Short Term Goal 1 (Week 1): Patient will complete LB dressing with min A OT Short Term Goal 2 (Week 1): Patient will tolerate standing at the sink for 3 minutes in preparation for BADL task. OT Short Term Goal 3 (Week 1): Patient will use L UE to apply deodorant with CGA.  Skilled Therapeutic Interventions/Progress Updates:     Pt received in bed with no pain but reports needing to use the bathroom. Pt with episode of incontinence on way to bathroom. ADL: Pt completes ADL at overall S level transfers with RW and MIN A for LB dressing. Skilled interventions include: VC for hand placement during transitional movements, elevation of walker to improve posture, VC for thoroughness of buttock hygiene and VC for turning water off. Pt provided with total A for doffing pants d/t being soiled. Pt completes 3/3 components of toileting with supervision.   Therapeutic activity Pt ambulates to/from gym with no AD and CGA with Vc for quick pace to improve balance. Pt completes seated [facility] with checkers palm<>finger translation and stacking checkers with LUE. Pt requires VC to decrease gravity as compensatory strategy and challenge [place]. Pt completes functional mobility to RN station and carries cup of ice in L hand back to the room.   Pt left at end of session in recliner with exit alarm on, call light in reach and all needs met   Therapy Documentation Precautions:  Precautions Precautions: Fall Precaution Comments: L inattention Restrictions Weight Bearing Restrictions: No   Therapy/Group: Individual Therapy  Tonny Branch 07/20/2021, 6:58 AM

## 2021-07-20 NOTE — Progress Notes (Signed)
Physical Therapy Session Note  Patient Details  Name: Denise Wright MRN: [id number] Date of Birth: [birth year]  Today's Date: 07/20/2021 PT Individual Time: 2458-0998 PT Individual Time Calculation (min): 69 min   Short Term Goals: Week 1:  PT Short Term Goal 1 (Week 1): Pt will perform supine to sit with minA consistently. PT Short Term Goal 2 (Week 1): Pt will perform bed to chair consistently with CGA. PT Short Term Goal 3 (Week 1): Pt will ambulate x150' with CGA and LRAD. PT Short Term Goal 4 (Week 1): Pt will perform x6 steps with R hand rail and minA.  Skilled Therapeutic Interventions/Progress Updates:     Pt received seated in recliner and agrees to therapy. No complaint of pain. Sit to stand with minA initially and remainder of session with close supervision and cues for sequencing. Pt performs ambulatory transfer to [facility] with CGA and cues for sequencing. WC transport to gym for time management. Pt performs gait training on treadmill with litegait for bodyweight support. Pt performs step up onto treadmill with L HHA and minA with cues for step sequencing. Pt able to standing with bilateral upper extremity support while PT dons harness. Pt performs following bouts on treadmill with extended seated rest break in between:  2:15 for 158' at 0.5 to 0.9 mph 4:00 for 349' at 1.0 mph  Backward for 2:00 at 0.5 mph for 70'  PT provides cues for trunk rotation, upright gaze to improve posture and balance, increasing stride length, and increasing step height due to pt tendency to drag feet, especially on L side and more with fatigue.  Pt steps down from treadmill with minA L HHA. Left seated in recliner with alarm intact and all needs within reach.  Therapy Documentation Precautions:  Precautions Precautions: Fall Precaution Comments: L inattention Restrictions Weight Bearing Restrictions: No   Therapy/Group: Individual Therapy  Breck Coons, PT, DPT 07/20/2021, 2:03 PM

## 2021-07-21 MED ORDER — DOCUSATE SODIUM 100 MG PO CAPS
100.0000 mg | ORAL_CAPSULE | Freq: Two times a day (BID) | ORAL | 0 refills | Status: AC
Start: 2021-07-21 — End: ?

## 2021-07-21 MED ORDER — LEVOTHYROXINE SODIUM 50 MCG PO TABS
50.0000 ug | ORAL_TABLET | Freq: Every day | ORAL | 0 refills | Status: AC
Start: 2021-07-21 — End: ?

## 2021-07-21 MED ORDER — HYDROCODONE-ACETAMINOPHEN 5-325 MG PO TABS: 1.0000 | ORAL_TABLET | ORAL | 0 refills | Status: DC | PRN

## 2021-07-21 MED ORDER — ACETAMINOPHEN 325 MG PO TABS
650.0000 mg | ORAL_TABLET | Freq: Four times a day (QID) | ORAL | Status: AC | PRN
Start: 2021-07-21 — End: ?

## 2021-07-21 MED ORDER — HYDROCHLOROTHIAZIDE 12.5 MG PO TABS: 6.2500 mg | ORAL_TABLET | Freq: Every day | ORAL | 0 refills | Status: DC

## 2021-07-21 MED ORDER — FAMOTIDINE 20 MG PO TABS: 20.0000 mg | ORAL_TABLET | Freq: Every day | ORAL | 0 refills | Status: DC

## 2021-07-21 MED ORDER — DEXAMETHASONE 2 MG PO TABS: ORAL_TABLET | ORAL | 0 refills | Status: DC

## 2021-07-21 MED ORDER — ALPRAZOLAM 0.25 MG PO TABS
0.2500 mg | ORAL_TABLET | Freq: Two times a day (BID) | ORAL | 0 refills | Status: DC | PRN
Start: 2021-07-21 — End: 2021-10-18

## 2021-07-21 NOTE — Progress Notes (Signed)
Physical Therapy Discharge Summary  Patient Details  Name: Denise Wright MRN: [id number] Date of Birth: Oct 02, 1950  Today's Date: 07/21/2021 PT Individual Time: 0901-1000 PT Individual Time Calculation (min): 59 min    Patient has met 8 of 8 long term goals due to improved activity tolerance, improved balance, improved postural control, increased strength, improved attention, and improved coordination.  Patient to discharge at an ambulatory level Supervision.   Patient's daughter attended family education and is independent to provide the necessary physical and cognitive assistance at discharge.  Reasons goals not met: NA  Recommendation:  Patient will benefit from ongoing skilled PT services in outpatient setting to continue to advance safe functional mobility, address ongoing impairments in strength, balance, ambulation, and minimize fall risk.  Equipment: No equipment provided  Reasons for discharge: treatment goals met and discharge from hospital  Patient/family agrees with progress made and goals achieved: Yes  Skilled Therapeutic Interventions: Pt received seated in recliner and agrees to therapy. No complaint of pain. Sit to stand and stand step transfer to [facility] with cues for sequencing, positioning, and increased eccentric control of sit to stand. Family present for family education and PT provides recommendation for supervision whenever pt is mobilizing OOB, and recommends ambulating at least 1x/hour at home with family. WC transport to gym for time management. Pt completes car transfer and ramp navigation with demonstration of technique and verbal cues for sequencing and increasing stride length and gait speed to decrease risk for falls. Pt ambulates x400' with same cueing as well as cues to increase trunk rotation and arm swing for improved balance. Pt completes x12 6" steps with cues for hand placement and step sequencing. Pt then perform furniture transfer from low couch with  cues for use of momentum. Pt performs transfer onto bed utilizing step stool as she has a high bed at home. Sit<>supine independently. WC transport back to room. PT re-wraps bilateral lower extremities for edema control and provides education on technique. Left seated in WC and handed off to SLP.   PT Discharge Precautions/Restrictions Precautions Precautions: Fall Precaution Comments: L inattention, improved from eval Restrictions Weight Bearing Restrictions: No Pain Interference Pain Interference Pain Effect on Sleep: 1. Rarely or not at all Pain Interference with Therapy Activities: 1. Rarely or not at all Pain Interference with Day-to-Day Activities: 1. Rarely or not at all Vision/Perception  Vision - History Ability to See in Adequate Light: 0 Adequate Perception Perception: Impaired Inattention/Neglect: Does not attend to left visual field;Other (comment) (much improved from eval) Praxis Praxis: Intact  Cognition Overall Cognitive Status: Impaired/Different from baseline Arousal/Alertness: Awake/alert Orientation Level: Oriented X4 Attention: Selective Sustained Attention: Appears intact Sustained Attention Impairment: Verbal basic;Functional basic Selective Attention: Impaired Selective Attention Impairment: Functional complex Memory: Impaired Memory Impairment: Retrieval deficit;Decreased short term memory Awareness: Impaired Awareness Impairment: Emergent impairment Problem Solving: Impaired Problem Solving Impairment: Functional complex Safety/Judgment: Appears intact Sensation Sensation Light Touch: Appears Intact Coordination Gross Motor Movements are Fluid and Coordinated: Yes Fine Motor Movements are Fluid and Coordinated: Yes Motor  Motor Motor: Hemiplegia Motor - Discharge Observations: Much improved from eval  Mobility Bed Mobility Bed Mobility: Supine to Sit;Sit to Supine Supine to Sit: Independent Sit to Supine:  Independent Transfers Transfers: Sit to Stand;Stand Pivot Transfers;Stand to Sit Sit to Stand: Supervision/Verbal cueing Stand to Sit: Supervision/Verbal cueing Stand Pivot Transfers: Supervision/Verbal cueing Stand Pivot Transfer Details: Verbal cues for sequencing;Verbal cues for technique Transfer (Assistive device): None Locomotion  Gait Ambulation: Yes Gait Assistance: Supervision/Verbal cueing  Gait Distance (Feet): 400 Feet Assistive device: None Gait Assistance Details: Verbal cues for gait pattern;Verbal cues for technique Gait Gait: Yes Gait Pattern: Impaired Gait Pattern: Decreased stride length Gait velocity: decreased Stairs / Additional Locomotion Stairs: Yes Stairs Assistance: Supervision/Verbal cueing Stair Management Technique: One rail Right Number of Stairs: 12 Height of Stairs: 6 Ramp: Supervision/Verbal cueing Curb: Supervision/Verbal cueing Wheelchair Mobility Wheelchair Mobility: No  Trunk/Postural Assessment  Cervical Assessment Cervical Assessment:  (forward head) Thoracic Assessment Thoracic Assessment:  (rounded shoulders) Lumbar Assessment Lumbar Assessment:  (posterior pelvic tilt) Postural Control Postural Control: Within Functional Limits  Balance Balance Balance Assessed: Yes Static Sitting Balance Static Sitting - Balance Support: Feet supported Static Sitting - Level of Assistance: 7: Independent Dynamic Sitting Balance Dynamic Sitting - Balance Support: Feet supported Dynamic Sitting - Level of Assistance: 7: Independent Static Standing Balance Static Standing - Balance Support: During functional activity Static Standing - Level of Assistance: 5: Stand by assistance Dynamic Standing Balance Dynamic Standing - Balance Support: During functional activity Dynamic Standing - Level of Assistance: 5: Stand by assistance Extremity Assessment  RLE Assessment General Strength Comments: Grossly 4+/5 LLE Assessment General Strength  Comments: Grossly 4/5    Breck Coons, PT ,DPT 07/21/2021, 4:12 PM

## 2021-07-21 NOTE — Progress Notes (Signed)
Physical Therapy Session Note  Patient Details  Name: Denise Wright MRN: [id number] Date of Birth: 09-24-1950  Today's Date: 07/21/2021 PT Individual Time: 2482-5003 PT Individual Time Calculation (min): 28 min   Short Term Goals: Week 1:  PT Short Term Goal 1 (Week 1): Pt will perform supine to sit with minA consistently. PT Short Term Goal 2 (Week 1): Pt will perform bed to chair consistently with CGA. PT Short Term Goal 3 (Week 1): Pt will ambulate x150' with CGA and LRAD. PT Short Term Goal 4 (Week 1): Pt will perform x6 steps with R hand rail and minA. Week 2:     Skilled Therapeutic Interventions/Progress Updates:   PAIN pt reports "tension" in forehead but states - it is not pain yet.  I know when it starts to turn that way and I will let the nurses know if I need meds."    Pt initially exiting bathroom w/nurse in room.  Pt ambulating w/RW.  Turn/sit to wc w/supervision and transported to breezeway.  Gait >277ft w/RW mod I including turns.  Repeated Sit to stand x 10 without use of hands for functional strengthening. Maintains balance w/transitions independently.  Sit to stand from wc mod I to Rw. Gait 28ft in household simulated environment mod I w/RW, turn/sit to recliner mod I.  Pt handed off to nurse Josh in room for administration of meds.   Therapy Documentation Precautions:  Precautions Precautions: Fall Precaution Comments: L inattention, improved from eval Restrictions Weight Bearing Restrictions: No   Therapy/Group: Individual Therapy Callie Fielding, [place] 07/21/2021, 2:40 PM

## 2021-07-21 NOTE — Progress Notes (Signed)
Speech Language Pathology Discharge Summary  Patient Details  Name: Denise Wright MRN: [id number] Date of Birth: 11-Nov-1950  Today's Date: 07/21/2021 SLP Individual Time: 1000-1045 SLP Individual Time Calculation (min): 45 min   Skilled Therapeutic Interventions:  Skilled treatment session focused on cognitive goals and completion of family education with the patient's daughter and son. SLP facilitated session by providing education regarding patient's current cognitive deficits and strategies to utilize at home to maximize recall, attention, problem solving and overall safety at home. Handouts were given to reinforce information. SLP re-administered the [facility] Mental Status Examination (SLUMS). Patient scored  26/30 points with a score of 27 or above considered normal. Patient's overall score improved 7 points since initial evaluation. Patient left upright in wheelchair with alarm on and all needs within reach.     Patient has met 6 of 6 long term goals.  Patient to discharge at [facility] level.   Reasons goals not met: N/A   Clinical Impression/Discharge Summary: Patient has made functional gains and has met 6 of 6 LTGs this admission. Currently, patient demonstrates improved cognitive functioning and requires overall Min-Mod A multimodal cues to complete novel, mildly complex tasks safely in regards to problem solving, recall with use of strategies, selective attention and emergent awareness. Patient's overall speech intelligibility is impacted by low vocal intensity which the patient's family reports has been since November of 2022. Recommend ENT consult. Patient and family education is complete and patient will discharge home with 24 hour supervision from family. Patient would benefit from f/u SLP services to maximize her cognitive functioning and overall functional independence in order to reduce caregiver burden.   Care Partner:  Caregiver Able to Provide Assistance: Yes   Type of Caregiver Assistance: Physical;Cognitive  Recommendation:  Outpatient SLP;24 hour supervision/assistance  Rationale for SLP Follow Up: Reduce caregiver burden;Maximize cognitive function and independence   Equipment: N/A   Reasons for discharge: Discharged from hospital;Treatment goals met   Patient/Family Agrees with Progress Made and Goals Achieved: Yes    Denise Wright 07/21/2021, 6:40 AM

## 2021-07-21 NOTE — Plan of Care (Signed)
Problem: RH Balance Goal: LTG: Patient will maintain dynamic sitting balance (OT) Description: LTG:  Patient will maintain dynamic sitting balance with assistance during activities of daily living (OT) Outcome: Completed/Met Goal: LTG Patient will maintain dynamic standing with ADLs (OT) Description: LTG:  Patient will maintain dynamic standing balance with assist during activities of daily living (OT)  Outcome: Completed/Met   Problem: Sit to Stand Goal: LTG:  Patient will perform sit to stand in prep for activites of daily living with assistance level (OT) Description: LTG:  Patient will perform sit to stand in prep for activites of daily living with assistance level (OT) Outcome: Completed/Met   Problem: RH Eating Goal: LTG Patient will perform eating w/assist, cues/equip (OT) Description: LTG: Patient will perform eating with assist, with/without cues using equipment (OT) Outcome: Completed/Met   Problem: RH Bathing Goal: LTG Patient will bathe all body parts with assist levels (OT) Description: LTG: Patient will bathe all body parts with assist levels (OT) Outcome: Completed/Met   Problem: RH Dressing Goal: LTG Patient will perform upper body dressing (OT) Description: LTG Patient will perform upper body dressing with assist, with/without cues (OT). Outcome: Completed/Met Goal: LTG Patient will perform lower body dressing w/assist (OT) Description: LTG: Patient will perform lower body dressing with assist, with/without cues in positioning using equipment (OT) Outcome: Completed/Met   Problem: RH Toileting Goal: LTG Patient will perform toileting task (3/3 steps) with assistance level (OT) Description: LTG: Patient will perform toileting task (3/3 steps) with assistance level (OT)  Outcome: Completed/Met   Problem: RH Functional Use of Upper Extremity Goal: LTG Patient will use RT/LT upper extremity as a (OT) Description: LTG: Patient will use right/left upper extremity as  a stabilizer/gross assist/diminished/nondominant/dominant level with assist, with/without cues during functional activity (OT) Outcome: Completed/Met   Problem: RH Tub/Shower Transfers Goal: LTG Patient will perform tub/shower transfers w/assist (OT) Description: LTG: Patient will perform tub/shower transfers with assist, with/without cues using equipment (OT) Outcome: Completed/Met   Problem: RH Attention Goal: LTG Patient will demonstrate this level of attention during functional activites (OT) Description: LTG:  Patient will demonstrate this level of attention during functional activites  (OT) Outcome: Completed/Met

## 2021-07-21 NOTE — Progress Notes (Signed)
Occupational Therapy Discharge Summary  Patient Details  Name: Denise Wright MRN: [id number] Date of Birth: November 30, 1950  Today's Date: 07/21/2021 OT Individual Time: 1104-1200 OT Individual Time Calculation (min): 56 min   OT treatment session focused on increased independence with BADL tasks and family education. Pt's daughter present. Discussed home set-up, tub shower transfer using tub bench, and modifications for safe BADL participation. Educated on attention deficits within functional BADL tasks and need for cues to maintain attention to task. Pt completed BADL tasks at overall supervision. Level. OT educated pt and daughter on use of bag technique to don TED hose. Discussed fine motor activities for L hand as well. Pt left seated in recliner with family present and needs met.    Patient has met 11 of 11 long term goals due to improved activity tolerance, improved balance, postural control, ability to compensate for deficits, functional use of  LEFT upper and LEFT lower extremity, improved attention, improved awareness, and improved coordination.  Patient to discharge at overall Supervision level.  Patient's care partner is independent to provide the necessary physical and cognitive assistance at discharge for higher level iADL tasks and shower transfers.    Reasons goals not met: n/a  Recommendation:  Patient will benefit from ongoing skilled OT services in outpatient setting to continue to advance functional skills in the area of BADL, functional use of L UE.  Equipment: No equipment provided  Reasons for discharge: treatment goals met and discharge from hospital  Patient/family agrees with progress made and goals achieved: Yes  OT Discharge Precautions/Restrictions  Precautions Precautions: Fall Precaution Comments: L inattention, improved from eval Restrictions Weight Bearing Restrictions: No Pain  Denies pain ADL ADL Eating: Modified independent Grooming: Modified  independent Upper Body Bathing: Supervision/safety Lower Body Bathing: Supervision/safety Upper Body Dressing: Supervision/safety Lower Body Dressing: Supervision/safety Toileting: Supervision/safety Toilet Transfer: Distant supervision Gaffer Transfer: Close supervision Perception  Perception: Impaired Inattention/Neglect: Does not attend to left visual field;Other (comment) (much improved from eval) Praxis Praxis: Intact Cognition Overall Cognitive Status: Impaired/Different from baseline Arousal/Alertness: Awake/alert Orientation Level: Oriented X4 Year: 2023 Month: January Day of Week: Correct Attention: Selective Sustained Attention: Appears intact Sustained Attention Impairment: Verbal basic;Functional basic Selective Attention: Impaired Selective Attention Impairment: Functional complex Memory: Impaired Memory Impairment: Retrieval deficit;Decreased short term memory Immediate Memory Recall: Bed;Blue;Sock Memory Recall Sock: Without Cue Memory Recall Blue: Without Cue Memory Recall Bed: Without Cue Awareness: Impaired Awareness Impairment: Emergent impairment Problem Solving: Impaired Problem Solving Impairment: Functional complex Safety/Judgment: Appears intact Sensation Sensation Light Touch: Appears Intact Coordination Gross Motor Movements are Fluid and Coordinated: Yes Fine Motor Movements are Fluid and Coordinated: Yes Motor  Motor Motor: Hemiplegia Motor - Discharge Observations: Much improved from eval Mobility  Bed Mobility Bed Mobility: Supine to Sit;Sit to Supine Supine to Sit: Independent Sit to Supine: Independent Transfers Sit to Stand: Supervision/Verbal cueing Stand to Sit: Supervision/Verbal cueing  Trunk/Postural Assessment  Cervical Assessment Cervical Assessment:  (forward head) Thoracic Assessment Thoracic Assessment:  (rounded shoulders) Lumbar Assessment Lumbar Assessment:  (posterior pelvic tilt) Postural  Control Postural Control: Within Functional Limits  Balance Balance Balance Assessed: Yes Static Sitting Balance Static Sitting - Balance Support: Feet supported Static Sitting - Level of Assistance: 7: Independent Dynamic Sitting Balance Dynamic Sitting - Balance Support: Feet supported Dynamic Sitting - Level of Assistance: 7: Independent Static Standing Balance Static Standing - Balance Support: During functional activity Static Standing - Level of Assistance: 5: Stand by assistance Dynamic Standing Balance Dynamic Standing - Balance  Support: During functional activity Dynamic Standing - Level of Assistance: 5: Stand by assistance Extremity/Trunk Assessment RUE Assessment RUE Assessment: Within Functional Limits LUE Assessment LUE Assessment: Exceptions to [facility] LUE Body System: Neuro Brunstrum levels for arm and hand: Arm;Hand Brunstrum level for arm: Stage V Relative Independence from Synergy Brunstrum level for hand: Stage VI Isolated joint movements LUE AROM (degrees) Left Shoulder Flexion: 160 Degrees LUE Strength LUE Overall Strength Comments: 4/5 overall   Denise Wright Denise Wright 07/21/2021, 3:22 PM

## 2021-07-21 NOTE — Progress Notes (Addendum)
PROGRESS NOTE   Subjective/Complaints: Had a pretty good night. No new issues. Family is in for education. Staples out with too much difficulty  ROS: Patient denies fever, rash, sore throat, blurred vision, dizziness, nausea, vomiting, diarrhea, cough, shortness of breath or chest pain,  or mood change.     Objective:   No results found. No results for input(s): WBC, HGB, HCT, PLT in the last 72 hours.  Recent Labs    07/20/21 0603  NA 136  K 4.6  CL 102  CO2 27  GLUCOSE 150*  BUN 23  CREATININE 1.11*  CALCIUM 8.8*    Intake/Output Summary (Last 24 hours) at 07/21/2021 1318 Last data filed at 07/21/2021 0900 Gross per 24 hour  Intake 442 ml  Output --  Net 442 ml        Physical Exam: Vital Signs Blood pressure 116/60, pulse 79, temperature 98 F (36.7 C), resp. rate 18, height 5' 2.01" (1.575 m), weight 99.2 kg, SpO2 100 %.  Constitutional: No distress . Vital signs reviewed. HEENT: NCAT, EOMI, oral membranes moist Neck: supple Cardiovascular: RRR without murmur. No JVD    Respiratory/Chest: CTA Bilaterally without wheezes or rales. Normal effort    GI/Abdomen: BS +, non-tender, non-distended Ext: no clubbing, cyanosis, or edema Psych: pleasant and cooperative  Skin: scalp incision cdi with staples removed Neuro:  Alert and oriented x 3. Fair insight and awareness. Intact Memory. Normal language and speech. Cranial nerve exam unremarkable. RUE and RLE 4-5/5. LUE 4-, LLE 3+ to 4-. Sensed LT/PP on all 4's. Ambulating with CGA/supervision only Musculoskeletal: Full ROM, No pain with AROM or PROM in the neck, trunk, or extremities. Posture appropriate     Assessment/Plan: 1. Functional deficits which require 3+ hours per day of interdisciplinary therapy in a comprehensive inpatient rehab setting. Physiatrist is providing close team supervision and 24 hour management of active medical problems listed  below. Physiatrist and rehab team continue to assess barriers to discharge/monitor patient progress toward functional and medical goals  Care Tool:  Bathing    Body parts bathed by patient: Left arm, Chest, Abdomen, Front perineal area, Right upper leg, Left upper leg, Face   Body parts bathed by helper: Buttocks     Bathing assist Assist Level: Moderate Assistance - Patient 50 - 74%     Upper Body Dressing/Undressing Upper body dressing   What is the patient wearing?: Pull over shirt    Upper body assist Assist Level: Moderate Assistance - Patient 50 - 74%    Lower Body Dressing/Undressing Lower body dressing      What is the patient wearing?: Pants, Incontinence brief     Lower body assist Assist for lower body dressing: Moderate Assistance - Patient 50 - 74%     Toileting Toileting    Toileting assist Assist for toileting: Moderate Assistance - Patient 50 - 74%     Transfers Chair/bed transfer  Transfers assist     Chair/bed transfer assist level: Minimal Assistance - Patient > 75%     Locomotion Ambulation   Ambulation assist      Assist level: Minimal Assistance - Patient > 75% Assistive device: Walker-rolling Max distance: 100'  Walk 10 feet activity   Assist     Assist level: Minimal Assistance - Patient > 75% Assistive device: Walker-rolling   Walk 50 feet activity   Assist    Assist level: Minimal Assistance - Patient > 75% Assistive device: Walker-rolling    Walk 150 feet activity   Assist Walk 150 feet activity did not occur: Safety/medical concerns         Walk 10 feet on uneven surface  activity   Assist     Assist level: Minimal Assistance - Patient > 75% Assistive device: Walker-rolling   Wheelchair     Assist Is the patient using a wheelchair?: No             Wheelchair 50 feet with 2 turns activity    Assist            Wheelchair 150 feet activity     Assist           Blood pressure 116/60, pulse 79, temperature 98 F (36.7 C), resp. rate 18, height 5' 2.01" (1.575 m), weight 99.2 kg, SpO2 100 %.  Medical Problem List and Plan: 1. 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   -Slow Decadron taper -Dr. Mickeal Skinner saw pt 2/9. Plan is XRT + temodar              -patient may shower                -ELOS/Goals: 2/11, goals S  -f/u with [facility] 3-4 weeks             -Continue CIR therapies including PT, OT, and SLP   2.  Antithrombotics: -DVT/anticoagulation:  Pharmaceutical: Heparin initiated 07/08/2021             -antiplatelet therapy: N/A 3. Pain Management: Hydrocodone as needed for headaches 4. Mood: Xanax 0.25 mg twice daily as needed             -antipsychotic agents: N/A 5. Neuropsych: This patient is capable of making decisions on her own behalf. 6. Skin/Wound Care: staples out, wound looks great 7. Fluids/Electrolytes/Nutrition: encourage intake  -protein supp has been for low albumin      8.  Seizure prophylaxis.  continue Keppra 500 mg twice daily 9.  Hypertension/lower ext edema.    -low dose hctz resumed -2/10 reduce decadron to 2mg  q12 which should further help -  ACE wraps of legs at night helpful for edema 10.  Hypothyroidism.  Synthroid 11.  Normocytic anemia.  hgb stable at 11.2 12.  Recent multiple teeth extraction 06/16/2021.  Continue Clindamycin 300 mg every 8 hours 13. AKI: BUN/Cr   32/1.05-->still 32/0.99 2/3  -pushing fluids  -low dose HCTZ resumed for LE edema  -BUN/Cr improved to near normal 2/9 14. Leukocytosis: down to 8.9    -likely steroid effect--discussed that with patient 15. Dysphonia:  -likely d/t VC trauma from intubation  -continue to monitor, if persistent make ENT referral as outpt    LOS: 9 days A FACE TO FACE EVALUATION WAS PERFORMED  Meredith Staggers 07/21/2021, 1:18 PM

## 2021-07-22 NOTE — Progress Notes (Signed)
Inpatient Rehabilitation Discharge Medication Review by a Pharmacist  A complete drug regimen review was completed for this patient to identify any potential clinically significant medication issues.  High Risk Drug Classes Is patient taking? 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    Other       Clinically significant medication issues were identified that warrant physician communication and completion of prescribed/recommended actions by midnight of the next day:  No  Time spent performing this drug regimen review (minutes):  20   Vance Peper, PharmD PGY1 Pharmacy Resident Phone [phone] 07/22/2021 8:25 AM   Please check AMION for all [place] phone numbers After 10:00 PM, call [place] [phone]

## 2021-07-22 NOTE — Progress Notes (Signed)
INPATIENT REHABILITATION DISCHARGE NOTE   Discharge instructions by: Linna Hoff PA  Verbalized understanding: verbalized   Skin care/Wound care healing? No wounds   Pain: 0/10  IV's: removed  Tubes/Drains: none  O2: room air   Safety instructions: given to pt and family   Patient belongings: sent with family   Discharged to: home   Discharged via: wheelchair   Notes:

## 2021-07-24 NOTE — Progress Notes (Signed)
Radiation Oncology         [phone] ________________________________  Name: Denise Wright        MRN: [id number]  Date of Service: 07/25/2021 DOB: [birth year]  XN:[id number], Denise Millin, MD  Denise Sellers, MD     REFERRING PHYSICIAN: Ventura Sellers, MD   DIAGNOSIS: The primary encounter diagnosis was 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[place]). A diagnosis of Glioblastoma of temporal lobe ([place]) was also pertinent to this visit.   HISTORY OF PRESENT ILLNESS: Denise Wright is a 71 y.o. female seen at the request of Dr. Mickeal Wright for a new diagnosis of glioblastoma.   The patient had several days of mental status changes that were concerning to her family and she was evaluated in the emergency department and CT head without contrast on 06/29/2021 showed a poorly visualized 2.3 cm right temporal mass with 4 mm right to left midline shift CT chest abdomen pelvis was negative for a primary but she did have an indeterminate 1.1 x 1.3 cm mediastinal hypodensity inferior to the right inferior pulmonary vein.  MRI of the brain with and without contrast on 06/30/2021 showed a large enhancing mass lesion in the right temporal lobe with central necrosis and mild internal hemorrhage there was mass effect with edema and 5 mm midline shift.  She underwent craniotomy with excision of the tumor with Dr. Reatha Wright on 07/06/2021 and postoperative MRI on 07/07/2021 showed expected postoperative changes with gross total resection of the right frontotemporal mass persistent mass effect with mild leftward midline shift was seen.  Final pathology showed IDH wild-type glioblastoma.  She has met with Dr. Mickeal Wright and is seen to discuss chemoradiation.    PREVIOUS RADIATION THERAPY: No   PAST MEDICAL HISTORY:  Past Medical History:  Diagnosis Date   Hyperlipidemia    Hypertension    Pneumonia    Thyroid disease        PAST SURGICAL HISTORY: Past Surgical History:  Procedure Laterality Date    ABDOMINAL HYSTERECTOMY  2025   APPLICATION OF CRANIAL NAVIGATION Right 07/06/2021   Procedure: APPLICATION OF CRANIAL NAVIGATION;  Surgeon: Dawley, Theodoro Doing, DO;  Location: [place];  Service: Neurosurgery;  Laterality: Right;   CRANIOTOMY Right 07/06/2021   Procedure: CRANIOTOMY TUMOR EXCISION;  Surgeon: Denise Ro, DO;  Location: [facility];  Service: Neurosurgery;  Laterality: Right;   TONSILLECTOMY  1965   TOOTH EXTRACTION       FAMILY HISTORY:  Family History  Problem Relation Age of Onset   Diabetes Mother    Hypertension Mother    Hypertension Father    Leukemia Father    Stroke Father    Cancer Brother    Pneumonia Brother      SOCIAL HISTORY:  reports that she has never smoked. She has never used smokeless tobacco. She reports that she does not drink alcohol and does not use drugs.   ALLERGIES: Fexofenadine and Penicillins   MEDICATIONS:  Current Outpatient Medications  Medication Sig Dispense Refill   acetaminophen (TYLENOL) 325 MG tablet Take 2 tablets (650 mg total) by mouth every 6 (six) hours as needed for mild pain (or Fever >/= 101).     ALPRAZolam (XANAX) 0.25 MG tablet Take 1 tablet (0.25 mg total) by mouth 2 (two) times daily as needed for anxiety. 10 tablet 0   b complex vitamins capsule Take 1 capsule by mouth daily.     cholecalciferol (VITAMIN D) 1000 UNITS tablet Take 2,000 Units by mouth daily.  dexamethasone (DECADRON) 2 MG tablet 2 mg every 8 hours x3 days then 2 mg twice daily x3 days then 2 mg daily x3 days and stop 18 tablet 0   docusate sodium (COLACE) 100 MG capsule Take 1 capsule (100 mg total) by mouth 2 (two) times daily. 10 capsule 0   famotidine (PEPCID) 20 MG tablet Take 1 tablet (20 mg total) by mouth daily. 30 tablet 0   fluticasone (FLONASE) 50 MCG/ACT nasal spray Place 1 spray into both nostrils daily as needed for allergies. OTC     hydrochlorothiazide (HYDRODIURIL) 12.5 MG tablet Take 0.5 tablets (6.25 mg total) by mouth daily. 30 tablet  0   HYDROcodone-acetaminophen (NORCO/VICODIN) 5-325 MG tablet Take 1 tablet by mouth every 4 (four) hours as needed for moderate pain. 30 tablet 0   levothyroxine (SYNTHROID) 50 MCG tablet Take 1 tablet (50 mcg total) by mouth daily. 30 tablet 0   Multiple Vitamins-Minerals (MULTIVITAMIN WITH MINERALS) tablet Take 1 tablet by mouth daily.     No current facility-administered medications for this encounter.     REVIEW OF SYSTEMS: On review of systems, the patient reports that she is Wright well overall. She is walking at home without significant difficulty and feels like she's almost back to normal. Her confusion has improved significantly since surgery, and she is hoping to get back to normal work schedule as well. She had her staples taken out and otherwise feels quite well. No other complaints are verbalized.      PHYSICAL EXAM:  Wt Readings from Last 3 Encounters:  07/25/21 216 lb 9.6 oz (98.2 kg)  07/19/21 218 lb 11.1 oz (99.2 kg)  07/06/21 206 lb 2.1 oz (93.5 kg)   Temp Readings from Last 3 Encounters:  07/25/21 97.8 F (36.6 C)  07/22/21 98.2 F (36.8 C)  07/12/21 98.8 F (37.1 C)   BP Readings from Last 3 Encounters:  07/25/21 (!) 106/58  07/22/21 136/78  07/12/21 128/69   Pulse Readings from Last 3 Encounters:  07/25/21 87  07/22/21 88  07/12/21 73   Pain Assessment Pain Score: 0-No pain/10  In general this is a well appearing African American female in no acute distress. She's alert and oriented x4 and appropriate throughout the examination. Cardiopulmonary assessment is negative for acute distress and she exhibits normal effort.     ECOG = 1  0 - Asymptomatic (Fully active, able to carry on all predisease activities without restriction)  1 - Symptomatic but completely ambulatory (Restricted in physically strenuous activity but ambulatory and able to carry out work of a light or sedentary nature. For example, light housework, office work)  2 - Symptomatic,  <50% in bed during the day (Ambulatory and capable of all self care but unable to carry out any work activities. Up and about more than 50% of waking hours)  3 - Symptomatic, >50% in bed, but not bedbound (Capable of only limited self-care, confined to bed or chair 50% or more of waking hours)  4 - Bedbound (Completely disabled. Cannot carry on any self-care. Totally confined to bed or chair)  5 - Death   Eustace Pen MM, Creech RH, Tormey DC, et al. [phone]). "Toxicity and response criteria of the [facility]". [place] Oncol. 5 (6): 649-55    LABORATORY DATA:  Lab Results  Component Value Date   WBC 8.9 07/17/2021   HGB 10.3 (L) 07/17/2021   HCT 31.4 (L) 07/17/2021   MCV 93.2 07/17/2021   PLT 257  07/17/2021   Lab Results  Component Value Date   NA 136 07/20/2021   K 4.6 07/20/2021   CL 102 07/20/2021   CO2 27 07/20/2021   Lab Results  Component Value Date   ALT 36 07/13/2021   AST 14 (L) 07/13/2021   ALKPHOS 74 07/13/2021   BILITOT 0.3 07/13/2021      RADIOGRAPHY: CT Head Wo Contrast  Result Date: 06/29/2021 CLINICAL DATA:  t c/o fatigue AND feeling "dis-combobulated" since oral surgery on 06/16/2021. Pt reports she fell on Christmas night when she tripped over a box of fruit on the floor but did not hit head. EXAM: CT HEAD WITHOUT CONTRAST TECHNIQUE: Contiguous axial images were obtained from the base of the skull through the vertex without intravenous contrast. RADIATION DOSE REDUCTION: This exam was performed according to the departmental dose-optimization program which includes automated exposure control, adjustment of the mA and/or kV according to patient size and/or use of iterative reconstruction technique. COMPARISON:  None. FINDINGS: Brain: No evidence of large-territorial acute infarction. No parenchymal hemorrhage. Poorly visualized 2.3 cm right temporal mass with associated vasogenic edema. No extra-axial collection. 4 mm right to left midline  shift. Partial effacement of the right frontal and temporal sulci. No hydrocephalus. Basilar cisterns are patent. Vascular: No hyperdense vessel. Skull: No acute fracture or focal lesion. Sinuses/Orbits: Paranasal sinuses and mastoid air cells are clear. The orbits are unremarkable. Other: None. IMPRESSION: Poorly visualized 2.3 cm right temporal mass with associated vasogenic edema as well as 4 mm right to left midline shift. Finding consistent with malignancy. Recommend MRI brain with and without contrast for further evaluation. Electronically Signed   By: Iven Finn M.D.   On: 06/29/2021 22:55   MR BRAIN W WO CONTRAST  Result Date: 07/07/2021 CLINICAL DATA:  Brain/CNS neoplasm, staging postop EXAM: MRI HEAD WITHOUT AND WITH CONTRAST TECHNIQUE: Multiplanar, multiecho pulse sequences of the brain and surrounding structures were obtained without and with intravenous contrast. CONTRAST:  9.39m GADAVIST GADOBUTROL 1 MMOL/ML IV SOLN COMPARISON:  Preoperative study 06/30/2021 FINDINGS: Brain: Postoperative changes of frontotemporal mass resection. Fluid, blood products, and air present within the resection cavity. Extra-axial collection is present underlying the craniotomy. There is bifrontal extra-axial air. Intrinsic T1 hyperintense blood products are present. There is some superimposed enhancement at the resection cavity margins that is likely postoperative. Mild nodularity is present inferiorly (series 23, image 18). Remains surrounding T2 FLAIR hyperintensity similar in extent to the preoperative study. Mass effect remains present including effacement the right lateral ventricle and mild leftward midline shift. There is no hydrocephalus. Vascular: Major vessel flow voids at the skull base are preserved. Skull and upper cervical spine: Normal marrow signal is preserved. Sinuses/Orbits: Minor mucosal thickening.  Orbits are unremarkable. Other: Sella is unremarkable.  Mastoid air cells are clear. IMPRESSION:  Expected postoperative changes of gross total resection of right frontotemporal mass. Attention on follow-up recommended for mildly nodular enhancement at the inferior margin. Persistent mass effect with mild leftward midline shift. Electronically Signed   By: PMacy MisM.D.   On: 07/07/2021 16:41   MR Brain W and Wo Contrast  Result Date: 06/30/2021 CLINICAL DATA:  Brain mass. EXAM: MRI HEAD WITHOUT AND WITH CONTRAST TECHNIQUE: Multiplanar, multiecho pulse sequences of the brain and surrounding structures were obtained without and with intravenous contrast. CONTRAST:  9.587mGADAVIST GADOBUTROL 1 MMOL/ML IV SOLN COMPARISON:  CT head 06/29/2021. FINDINGS: Brain: Large mass lesion right temporal lobe. The mass shows irregular peripheral enhancement and central necrosis.  Mild internal hemorrhage is noted. Surrounding nonenhancing edema is noted. The mass measures approximately 4.5 x 5.7 x 4.5 cm. There is mass-effect with 5 mm midline shift to the right. No second lesion identified Mild patchy white matter hyperintensities bilaterally most likely chronic ischemia. No acute infarct. Vascular: Normal arterial flow voids. Skull and upper cervical spine: No focal skeletal lesion. Sinuses/Orbits: Mucosal edema and air-fluid level right maxillary sinus. Negative orbit Other: None IMPRESSION: Large enhancing mass lesion in the right temporal lobe. Central necrosis and mild internal hemorrhage. 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 There is mass-effect with edema and 5 mm midline shift. Electronically Signed   By: Franchot Gallo M.D.   On: 06/30/2021 18:00   CT CHEST ABDOMEN PELVIS W CONTRAST  Result Date: 06/30/2021 CLINICAL DATA:  Brain metastases, unknown primary. "Pt c/o fatigue AND feeling "dis-combobulated" since oral surgery on 06/16/2021. Pt reports she fell on Christmas night when she tripped over a box of fruit on the floor but did not hit head. EXAM: CT CHEST, ABDOMEN, AND PELVIS WITH CONTRAST TECHNIQUE:  Multidetector CT imaging of the chest, abdomen and pelvis was performed following the standard protocol during bolus administration of intravenous contrast. RADIATION DOSE REDUCTION: This exam was performed according to the departmental dose-optimization program which includes automated exposure control, adjustment of the mA and/or kV according to patient size and/or use of iterative reconstruction technique. CONTRAST:  171m OMNIPAQUE IOHEXOL 300 MG/ML  SOLN COMPARISON:  None. FINDINGS: CHEST: Ports and Devices: None. Lungs/airways: No focal consolidation. No pulmonary nodule. No pulmonary mass. No pulmonary contusion or laceration. No pneumatocele formation. The central airways are patent. Pleura: No pleural effusion. No pneumothorax. No hemothorax. Lymph Nodes: No mediastinal, hilar, or axillary lymphadenopathy. Mediastinum: There is a 1.1 x 1 x 1.3 cm hypodensity within the mediastinum inferior to the right inferior pulmonary vein (2:35) no pneumomediastinum. No aortic injury or mediastinal hematoma. The thoracic aorta is normal in caliber. The heart is normal in size. No significant pericardial effusion. The esophagus is unremarkable.  Possible tiny hiatal hernia. The thyroid is unremarkable. The main pulmonary artery is normal in caliber. No central or proximal segmental pulmonary embolus. Chest Wall / Breasts: No chest wall mass. Musculoskeletal: No acute rib or sternal fracture. No spinal fracture. No suspicious lytic or blastic osseous lesion. Densely sclerotic T6 vertebral body lesion likely represents a bone island (6:84). ABDOMEN / PELVIS: Liver: Not enlarged. There is a 1.5 x 1 cm fluid density lesion within the right hepatic lobe (2:51) as well as another measuring 1.6 x 0 1 cm (2:49). Adjacent associated subcentimeter hypodensity (2:51). Couple of other subcentimeter hypodensities noted throughout the liver (2:35, 42). No laceration or subcapsular hematoma. Biliary System: The gallbladder is otherwise  unremarkable with no radio-opaque gallstones. No biliary ductal dilatation. Pancreas: Normal pancreatic contour. No main pancreatic duct dilatation. Spleen: Not enlarged. Single punctate hypodensity too small to characterize. Otherwise no focal lesion. No laceration, subcapsular hematoma, or vascular injury. Adrenal Glands: No nodularity bilaterally. Kidneys: Bilateral kidneys enhance symmetrically. No hydronephrosis. No contusion, laceration, or subcapsular hematoma. Punctate hypodensities too small to characterize. No injury to the vascular structures or collecting systems. No hydroureter. The urinary bladder is unremarkable. Bowel: No small or large bowel wall thickening or dilatation. Scattered colonic diverticulosis. The appendix is normal. Mesentery, Omentum, and Peritoneum: No simple free fluid ascites. No pneumoperitoneum. No hemoperitoneum. No mesenteric hematoma identified. No organized fluid collection. Pelvic Organs: Status post hysterectomy. Bilateral adnexal regions are unremarkable. Likely left ovary visualized (2:96).  Right ovary not definitely visualized. Lymph Nodes: No abdominal, pelvic, inguinal lymphadenopathy. Vasculature: Mild atherosclerotic. No abdominal aorta or iliac aneurysm. No active contrast extravasation or pseudoaneurysm. Musculoskeletal: No significant soft tissue hematoma. No acute pelvic fracture. No spinal fracture. No suspicious lytic or blastic osseous lesions. Intervertebral disc space vacuum phenomenon at the L4-L5 level. IMPRESSION: 1. No findings of primary malignancy or metastatic disease. 2. No acute traumatic injury to the chest, abdomen, or pelvis. 3. No acute fracture or traumatic malalignment of the thoracic or lumbar spine. Other imaging findings of potential clinical significance: 1. Indeterminate 1.1 x 1 x 1.3 cm likely mediastinal hypodensity inferior to the right inferior pulmonary vein. Finding could represent a pericardial cyst or duplication cyst. Recommend  attention on follow-up. 2. Several hypodensities within the liver some of which measure fluid density (likely hepatic cysts) and some of which are too small to characterize. 3. Scattered colonic diverticulosis. Electronically Signed   By: Iven Finn M.D.   On: 06/30/2021 00:27       IMPRESSION/PLAN: 1. IDH wild-type glioblastoma of the right temporal lobe.Dr. Lisbeth Renshaw discusses the pathology findings and reviews the nature of primary brain disease. Dr. Lisbeth Renshaw recommends chemoRT, and she will see Dr. Mickeal Wright next week to discuss next steps for Temodar chemotherapy.  We discussed the risks, benefits, short, and long term effects of radiotherapy, as well as the curative intent, and the patient is interested in proceeding. Dr. Lisbeth Renshaw discusses the delivery and logistics of radiotherapy and anticipates a course of 6 weeks of radiotherapy. Written consent is obtained and placed in the chart, a copy was provided to the patient. She will be contacted to discuss scheduling simulation by our staff. We anticipate she would start treatment either the last week of this month, or the first week of March 2023.  In a visit lasting 60 minutes, greater than 50% of the time was spent face to face with Dr. Lisbeth Renshaw (and myself on Webex remotely) discussing the patient's condition, in preparation for the discussion, and coordinating the patient's care.   The above documentation reflects my direct findings during this shared patient visit. Please see the separate note by Dr. Lisbeth Renshaw on this date for the remainder of the patient's plan of care.    Carola Rhine, [facility]   **Disclaimer: This note was dictated with voice recognition software. Similar sounding words can inadvertently be transcribed and this note may contain transcription errors which may not have been corrected upon publication of note.**

## 2021-07-24 NOTE — Progress Notes (Signed)
Inpatient Rehabilitation Care Coordinator Discharge Note   Patient Details  Name: Denise Wright MRN: [id number] Date of Birth: 09-Apr-1951   Discharge location: D/c to home with her children to provide assistance. Support from various family/friends.  Length of Stay: 9 days  Discharge activity level: Supervision  Home/community participation: Limited  Patient response JH:HIDUPB Literacy - How often do you need to have someone help you when you read instructions, pamphlets, or other written material from your doctor or pharmacy?: Never  Patient response DH:DIXBOE Isolation - How often do you feel lonely or isolated from those around you?: Never  Services provided included: MD, RD, PT, OT, SLP, CM, RN, TR, Pharmacy, Neuropsych, SW  Financial Services:  Charity fundraiser Utilized: [place] offered to/list presented to: Yes  Follow-up services arranged:  Outpatient    Outpatient Servicies: [facility] for outpatient PT/OT/SLP DME : has RW and 3in1 BSC    Patient response to transportation need: Is the patient able to respond to transportation needs?: Yes In the past 12 months, has lack of transportation kept you from medical appointments or from getting medications?: No In the past 12 months, has lack of transportation kept you from meetings, work, or from getting things needed for daily living?: No   Comments (or additional information):  Patient/Family verbalized understanding of follow-up arrangements:  Yes  Individual responsible for coordination of the follow-up plan: contact pt dtr [name] [phone]  Confirmed correct DME delivered: Rana Snare 07/24/2021    Rana Snare

## 2021-07-24 NOTE — Progress Notes (Signed)
Location/Histology of Brain Tumor: Right temporal Glioblastoma  Patient presented with several days, weeks of progressive mental status changes.  Her family noticed she was confused and disoriented over normal conversation and activities of daily living.  Past or anticipated interventions, if any, per neurosurgery:  Dr. Reatha Armour -Right temporal craniotomy, path is glioblastoma IDH-1wt 07/06/2021   Past or anticipated interventions, if any, per medical oncology:  Dr. Mickeal Skinner 07/20/2021 -We ultimately recommended proceeding with course of intensity modulated radiation therapy and concurrent daily Temozolomide.  Radiation will be administered Mon-Fri over 6 weeks, Temodar will be dosed at 6m/m2 to be given daily over 42 days. -Every 2 weeks during radiation, labs will be checked accompanied by a clinical evaluation in the brain tumor clinic -The patient may be a candidate for Ramipril cognitive research protocol at this time; further discussion will occur with the research team.    Dose of Decadron, if applicable: 2 mg BID, Stop on Friday 07/28/2021  Recent neurologic symptoms, if any:  Seizures: No Headaches: She reports mild headaches on occasion, relieved with aleve.  Nausea: No Dizziness/ataxia: No Difficulty with hand coordination: She reports improvement in her left hand.  Able to move it more/better. Focal numbness/weakness: Denies numbness and weakness. Visual deficits/changes: No Confusion/Memory deficits: Family notes occasional memory loss. Gait:  She is using a walker to get around, walking slowly.    SAFETY ISSUES: Prior radiation? No Pacemaker/ICD? No Possible current pregnancy?  Hysterectomy  Is the patient on methotrexate? no  Additional Complaints / other details:  -Lives at home with family, prior to this she lived alone.

## 2021-07-25 ENCOUNTER — Encounter (HOSPITAL_COMMUNITY): Payer: Self-pay

## 2021-07-25 ENCOUNTER — Ambulatory Visit
Admission: RE | Admit: 2021-07-25 | Discharge: 2021-07-25 | Disposition: A | Discharge status: Home / Self Care | Payer: BC Managed Care – PPO | Source: Ambulatory Visit | Attending: Radiation Oncology | Admitting: Radiation Oncology

## 2021-07-25 ENCOUNTER — Other Ambulatory Visit: Payer: Self-pay

## 2021-07-25 ENCOUNTER — Encounter: Payer: Self-pay | Admitting: Radiation Oncology

## 2021-07-25 VITALS — BP 106/58 | HR 87 | Temp 97.8°F | Resp 20 | Ht 62.5 in | Wt 216.6 lb

## 2021-07-25 DIAGNOSIS — K769 Liver disease, unspecified: Secondary | ICD-10-CM | POA: Diagnosis not present

## 2021-07-25 DIAGNOSIS — C719 Malignant neoplasm of brain, unspecified: Secondary | ICD-10-CM

## 2021-07-25 DIAGNOSIS — Z800 Family history of malignant neoplasm of digestive organs: Secondary | ICD-10-CM | POA: Diagnosis not present

## 2021-07-25 DIAGNOSIS — K5730 Diverticulosis of large intestine without perforation or abscess without bleeding: Secondary | ICD-10-CM | POA: Diagnosis not present

## 2021-07-25 DIAGNOSIS — Z79899 Other long term (current) drug therapy: Secondary | ICD-10-CM | POA: Insufficient documentation

## 2021-07-25 DIAGNOSIS — E079 Disorder of thyroid, unspecified: Secondary | ICD-10-CM | POA: Diagnosis not present

## 2021-07-25 DIAGNOSIS — I10 Essential (primary) hypertension: Secondary | ICD-10-CM | POA: Insufficient documentation

## 2021-07-25 DIAGNOSIS — E785 Hyperlipidemia, unspecified: Secondary | ICD-10-CM | POA: Insufficient documentation

## 2021-07-25 DIAGNOSIS — C712 Malignant neoplasm of temporal lobe: Secondary | ICD-10-CM

## 2021-07-25 DIAGNOSIS — Z806 Family history of leukemia: Secondary | ICD-10-CM | POA: Diagnosis not present

## 2021-07-25 DIAGNOSIS — Z8701 Personal history of pneumonia (recurrent): Secondary | ICD-10-CM | POA: Diagnosis not present

## 2021-07-28 ENCOUNTER — Other Ambulatory Visit: Payer: Self-pay

## 2021-07-28 ENCOUNTER — Ambulatory Visit
Admission: RE | Admit: 2021-07-28 | Discharge: 2021-07-28 | Disposition: A | Discharge status: Home / Self Care | Payer: BC Managed Care – PPO | Source: Ambulatory Visit | Attending: Radiation Oncology | Admitting: Radiation Oncology

## 2021-07-28 ENCOUNTER — Inpatient Hospital Stay: Payer: BC Managed Care – PPO | Admitting: Emergency Medicine

## 2021-07-28 VITALS — BP 110/54 | HR 89 | Temp 97.2°F | Resp 18 | Wt 215.4 lb

## 2021-07-28 DIAGNOSIS — C712 Malignant neoplasm of temporal lobe: Secondary | ICD-10-CM | POA: Insufficient documentation

## 2021-07-28 DIAGNOSIS — C719 Malignant neoplasm of brain, unspecified: Secondary | ICD-10-CM

## 2021-07-28 DIAGNOSIS — Z510 Encounter for antineoplastic radiation therapy: Secondary | ICD-10-CM | POA: Insufficient documentation

## 2021-07-28 MED ORDER — SODIUM CHLORIDE 0.9% FLUSH
10.0000 mL | Freq: Once | INTRAVENOUS | Status: AC
  Administered 2021-07-28: 10 mL via INTRAVENOUS

## 2021-07-28 NOTE — Progress Notes (Signed)
Has armband been applied?  Yes  Does patient have an allergy to IV contrast dye?: No   Has patient ever received premedication for IV contrast dye?:  n/a  Does patient take metformin?: n/a  If patient does take metformin when was the last dose: N/a  Date of lab work: 07/20/2021 BUN: 23 CR: 1.11 eGfr: 54  IV site: Right Wrist  Has IV site been added to flowsheet?

## 2021-07-28 NOTE — Research (Signed)
[id number] A Single Arm, Pilot Study of Ramipril for Preventing Radiation-Induced Cognitive Decline in Glioblastoma (GBM) Patients Receiving Brain Radiotherapy  INTRO CONSENTS  Patient Denise Wright was identified by this Clinical Research Nurse as a potential candidate for the above listed study.  This Clinical Research Nurse met with Denise Wright, [id number], on 07/28/21 in a manner and location that ensures patient privacy to discuss participation in the above listed research study.  Patient is Accompanied by [name] .  A copy of the informed consent document and separate HIPAA Authorization was provided to the patient.  Patient reads, speaks, and understands [place].   Patient was provided with the business card of this Nurse and encouraged to contact the research team with any questions.  Approximately 25 minutes were spent with the patient reviewing the informed consent documents.  Patient was provided the option of taking informed consent documents home to review and was encouraged to review at their convenience with their support network, including other care providers. Patient took the consent documents home to review.  Will f/u with patient in next few days to determine interest.  Will introduce DCP-001 to patient at that time.  Wells Guiles 'Learta CoddingNeysa Bonito, RN, BSN Clinical Research Nurse I 07/28/21 3:30 PM

## 2021-07-31 ENCOUNTER — Telehealth: Payer: Self-pay | Admitting: Pharmacist

## 2021-07-31 ENCOUNTER — Other Ambulatory Visit (HOSPITAL_COMMUNITY): Payer: Self-pay

## 2021-07-31 ENCOUNTER — Other Ambulatory Visit: Payer: Self-pay | Admitting: Internal Medicine

## 2021-07-31 ENCOUNTER — Encounter: Payer: Self-pay | Admitting: Internal Medicine

## 2021-07-31 ENCOUNTER — Telehealth: Payer: Self-pay

## 2021-07-31 DIAGNOSIS — C719 Malignant neoplasm of brain, unspecified: Secondary | ICD-10-CM

## 2021-07-31 MED ORDER — ONDANSETRON HCL 8 MG PO TABS
8.0000 mg | ORAL_TABLET | Freq: Two times a day (BID) | ORAL | 1 refills | Status: DC | PRN
  Filled 2021-07-31: qty 30, 15d supply, fill #0
  Filled 2021-09-06: qty 30, 15d supply, fill #1

## 2021-07-31 MED ORDER — TEMOZOLOMIDE 140 MG PO CAPS
140.0000 mg | ORAL_CAPSULE | Freq: Every day | ORAL | 0 refills | Status: DC
  Filled 2021-07-31: qty 45, 45d supply, fill #0
  Filled 2021-08-01: qty 28, 28d supply, fill #0
  Filled 2021-08-22: qty 14, 14d supply, fill #1

## 2021-07-31 NOTE — Telephone Encounter (Signed)
Oral Oncology Pharmacist Encounter  Received new prescription for Temodar (temozolomide) for the treatment of glioblastoma, IDH-1 WT, in conjunction with radiation, planned duration 42 days.  BMP and CBC from 07/20/21 assessed, noted Scr 1.11 mg/dL (CrCl ~73.8 mL/min) no baseline dose adjustments required. Prescription dose and frequency assessed for appropriateness. Appropriate for therapy initiation.   Current medication list in Epic reviewed, no relevant/significant DDIs with Temodar identified.  Evaluated chart and no patient barriers to medication adherence noted.   Patient agreement for treatment documented in MD note on 07/20/21.  Prescription has been e-scribed to the [facility] for benefits analysis and approval.  Oral Oncology Clinic will continue to follow for insurance authorization, copayment issues, initial counseling and start date.  Leron Croak, PharmD, BCPS Hematology/Oncology Clinical Pharmacist [facility] and [place] [phone] 07/31/2021 9:55 AM

## 2021-07-31 NOTE — Telephone Encounter (Signed)
Oral Oncology Patient Advocate Encounter  Prior Authorization for Temodar has been approved.    PA# [id number] Effective dates: 07/31/21 through 07/30/22  Patients co-pay is $0  Oral Oncology Clinic will continue to follow.   [place] Patient [place] Phone [phone] Fax [phone] 07/31/2021 12:37 PM

## 2021-07-31 NOTE — Progress Notes (Signed)

## 2021-07-31 NOTE — Telephone Encounter (Signed)
Oral Oncology Patient Advocate Encounter   Received notification from [facility] that prior authorization for Temodar is required.   PA submitted on CoverMyMeds Key [id number] Status is pending   Oral Oncology Clinic will continue to follow.  [place] Patient [place] Phone [phone] Fax [phone] 07/31/2021 9:54 AM

## 2021-08-01 ENCOUNTER — Encounter: Payer: BC Managed Care – PPO | Attending: Registered Nurse | Admitting: Registered Nurse

## 2021-08-01 ENCOUNTER — Other Ambulatory Visit (HOSPITAL_COMMUNITY): Payer: Self-pay

## 2021-08-01 ENCOUNTER — Other Ambulatory Visit: Payer: Self-pay

## 2021-08-01 ENCOUNTER — Encounter: Payer: Self-pay | Admitting: Registered Nurse

## 2021-08-01 VITALS — BP 127/75 | HR 86 | Temp 97.6°F | Ht 62.5 in | Wt 224.0 lb

## 2021-08-01 DIAGNOSIS — I10 Essential (primary) hypertension: Secondary | ICD-10-CM | POA: Insufficient documentation

## 2021-08-01 DIAGNOSIS — C719 Malignant neoplasm of brain, unspecified: Secondary | ICD-10-CM | POA: Insufficient documentation

## 2021-08-01 NOTE — Telephone Encounter (Signed)
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   Counseled patient on administration, dosing, side effects, monitoring, drug-food interactions, safe handling, storage, and disposal.  Patient will take Temodar 177m capsules, 1 capsule (140 mg total daily dose), by mouth once daily, may take at bedtime and on an empty stomach to decrease nausea and vomiting.  Patient will take Temodar concurrent with radiation for 42 days straight.  Temodar start date: 08/06/21 PM Radiation start date: 08/07/21   Patient will take Zofran 843mtablet, 1 tablet by mouth 30-60 min prior to Temodar dose to help decrease N/V once starting adjuvant therapy. Prophylactic Zofran will not be used at initiation of concurrent phase, but will be initiated if nausea develops despite Temodar administration on an empty stomach and at bedtime.   Adverse effects include but are not limited to: nausea, vomiting, decreased appetite, GI upset, and fatigue.  Rare but serious adverse effects include pneumocystis pneumonia and secondary malignancy. PCP prophylaxis will not be initiated at this time, but may be added based on lymphocyte count in the future.  Reviewed with patient importance of keeping a medication schedule and plan for any missed doses. No barriers to medication adherence identified.  Medication reconciliation performed and medication/allergy list updated.  Insurance authorization for Temodar has been obtained. Test claim at the pharmacy revealed copayment $0 for 1st fill of Temodar. [facility] informed the pharmacy will reach out 5-7 days prior to needing next fill of Temodar to coordinate continued medication acquisition to prevent break  in therapy.   All questions answered.  Ms. NoDonofriooiced understanding and appreciation.   Medication education handout and medication calendar [facility]21. Patient knows to call the office with questions or concerns. Oral Chemotherapy Clinic phone number provided to patient.   ReLeron CroakPharmD, BCPS Hematology/Oncology Clinical Pharmacist [facility] [id number] 086 5970/21/2023 12:53 PM
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
## 2021-08-03 ENCOUNTER — Inpatient Hospital Stay (HOSPITAL_BASED_OUTPATIENT_CLINIC_OR_DEPARTMENT_OTHER): Payer: BC Managed Care – PPO | Admitting: Internal Medicine

## 2021-08-03 ENCOUNTER — Other Ambulatory Visit: Payer: Self-pay | Admitting: Emergency Medicine

## 2021-08-03 ENCOUNTER — Other Ambulatory Visit: Payer: Self-pay

## 2021-08-03 ENCOUNTER — Encounter: Payer: Self-pay | Admitting: Internal Medicine

## 2021-08-03 ENCOUNTER — Inpatient Hospital Stay: Payer: BC Managed Care – PPO | Admitting: Emergency Medicine

## 2021-08-03 ENCOUNTER — Inpatient Hospital Stay: Payer: BC Managed Care – PPO

## 2021-08-03 VITALS — BP 119/53 | HR 82 | Temp 98.1°F | Resp 18 | Ht 62.5 in | Wt 225.3 lb

## 2021-08-03 DIAGNOSIS — C712 Malignant neoplasm of temporal lobe: Secondary | ICD-10-CM | POA: Diagnosis present

## 2021-08-03 DIAGNOSIS — I10 Essential (primary) hypertension: Secondary | ICD-10-CM

## 2021-08-03 DIAGNOSIS — Z90710 Acquired absence of both cervix and uterus: Secondary | ICD-10-CM | POA: Diagnosis not present

## 2021-08-03 DIAGNOSIS — Z806 Family history of leukemia: Secondary | ICD-10-CM | POA: Diagnosis not present

## 2021-08-03 DIAGNOSIS — C719 Malignant neoplasm of brain, unspecified: Secondary | ICD-10-CM

## 2021-08-03 DIAGNOSIS — Z7189 Other specified counseling: Secondary | ICD-10-CM

## 2021-08-03 LAB — CMP (CANCER CENTER ONLY)
ALT: 24 U/L (ref 0–44)
AST: 16 U/L (ref 15–41)
Albumin: 3 g/dL — ABNORMAL LOW (ref 3.5–5.0)
Alkaline Phosphatase: 93 U/L (ref 38–126)
Anion gap: 4 — ABNORMAL LOW (ref 5–15)
BUN: 24 mg/dL — ABNORMAL HIGH (ref 8–23)
CO2: 29 mmol/L (ref 22–32)
Calcium: 8.4 mg/dL — ABNORMAL LOW (ref 8.9–10.3)
Chloride: 108 mmol/L (ref 98–111)
Creatinine: 0.76 mg/dL (ref 0.44–1.00)
GFR, Estimated: >60 mL/min (ref >60)
Glucose, Bld: 75 mg/dL (ref 70–99)
Potassium: 4.1 mmol/L (ref 3.5–5.1)
Sodium: 141 mmol/L (ref 135–145)
Total Bilirubin: 0.4 mg/dL (ref 0.3–1.2)
Total Protein: 5.6 g/dL — ABNORMAL LOW (ref 6.5–8.1)

## 2021-08-03 LAB — CBC WITH DIFFERENTIAL (CANCER CENTER ONLY)
Abs Immature Granulocytes: 0.05 10*3/uL (ref 0.00–0.07)
Basophils Absolute: 0 10*3/uL (ref 0.0–0.1)
Basophils Relative: 0 %
Eosinophils Absolute: 0.1 10*3/uL (ref 0.0–0.5)
Eosinophils Relative: 2 %
HCT: 32.3 % — ABNORMAL LOW (ref 36.0–46.0)
Hemoglobin: 10.4 g/dL — ABNORMAL LOW (ref 12.0–15.0)
Immature Granulocytes: 1 %
Lymphocytes Relative: 22 %
Lymphs Abs: 1.3 10*3/uL (ref 0.7–4.0)
MCH: 31 pg (ref 26.0–34.0)
MCHC: 32.2 g/dL (ref 30.0–36.0)
MCV: 96.1 fL (ref 80.0–100.0)
Monocytes Absolute: 0.6 10*3/uL (ref 0.1–1.0)
Monocytes Relative: 9 %
Neutro Abs: 4.1 10*3/uL (ref 1.7–7.7)
Neutrophils Relative %: 66 %
Platelet Count: 144 10*3/uL — ABNORMAL LOW (ref 150–400)
RBC: 3.36 MIL/uL — ABNORMAL LOW (ref 3.87–5.11)
RDW: 15.4 % (ref 11.5–15.5)
WBC Count: 6.1 10*3/uL (ref 4.0–10.5)
nRBC: 0 % (ref 0.0–0.2)

## 2021-08-03 LAB — RESEARCH LABS

## 2021-08-03 NOTE — Research (Signed)
[id number] A Single Arm, Pilot Study of Ramipril for Preventing Radiation-Induced Cognitive Decline in Glioblastoma (GBM) Patients Receiving Brain Radiotherapy  CONSENT  Patient Denise Wright was identified by this Clinical Research Nurse as a potential candidate for the above listed study.  This Clinical Research Nurse met with Denise Wright, [id number] on 08/03/21 in a manner and location that ensures patient privacy to discuss participation in the above listed research study.  Patient is Accompanied by [name] .  Patient was previously provided with informed consent documents.  Patient confirmed they have read the informed consent documents.  As outlined in the informed consent form, this Nurse and Denise Wright discussed the purpose of the research study, the investigational nature of the study, study procedures and requirements for study participation, potential risks and benefits of study participation, as well as alternatives to participation.  This study is not blinded or double-blinded. The patient understands participation is voluntary and they may withdraw from study participation at any time.  This study does not involve randomization.  Potential side effects were reviewed with patient as outlined in the consent form, and patient made aware there may be side effects not yet known. This study does not involve a placebo. Patient understands enrollment is pending full eligibility review.   Confidentiality and how the patient's information will be used as part of study participation were discussed.  Patient was informed there is not reimbursement provided for their time and effort spent on trial participation.  The patient is encouraged to discuss research study participation with their insurance provider to determine what costs they may incur as part of study participation, including research related injury.    All questions were answered to patient's satisfaction.  The informed  consent and separate HIPAA Authorization was reviewed page by page.  The patient's mental and emotional status is appropriate to provide informed consent, and the patient verbalizes an understanding of study participation.  Patient has agreed to participate in the above listed research study and has voluntarily signed the informed consent version date 04/04/21 ([facility] date 05/25/21) and separate HIPAA Authorization, version date 04/26/17 ([facility] date 05/15/21)  on 08/03/21 at 12:00 PM.  The patient was provided with a copy of the signed informed consent form and separate HIPAA Authorization for their reference.  No study specific procedures were obtained prior to the signing of the informed consent document.  Approximately 45 minutes were spent with the patient reviewing the informed consent documents.  Patient was not requested to complete a Release of Information form.  Denise Wright 'Learta CoddingNeysa Bonito, RN, BSN Clinical Research Nurse I 08/03/21 1:42 PM

## 2021-08-03 NOTE — Research (Signed)
[id number] A Single Arm, Pilot Study of Ramipril for Preventing Radiation-Induced Cognitive Decline in Glioblastoma (GBM) Patients Receiving Brain Radiotherapy  REGISTRATION  Patient JERICHO CIESLIK, was registered to the above listed study, assigned study # [id number]. Randomization is not required for this study.  Study drug ordered today (08/03/21).  Wells Guiles 'Learta CoddingNeysa Bonito, RN, BSN Clinical Research Nurse I 08/03/21 2:59 PM

## 2021-08-03 NOTE — Progress Notes (Signed)
[facility] [place], [place] [address] [phone]   Interval Evaluation  Date of Service: 08/03/21 Patient Name: Denise Wright Patient MRN: [id number] Patient DOB: 17-Oct-1950 Provider: Ventura Sellers, MD  Identifying Statement:  Denise Wright is a 71 y.o. female with right temporal glioblastoma   Oncologic History: Oncology History  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[place])  07/06/2021 Surgery   Right temporal craniotomy, resection with Dr. Reatha Armour; path is glioblastoma IDH-1wt   07/12/2021 Initial Diagnosis   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[place])   08/07/2021 -  Chemotherapy   Patient is on Treatment Plan : BRAIN GLIOBLASTOMA Radiation Therapy With Concurrent Temozolomide 75 mg/m2 Daily Followed By Sequential Maintenance Temozolomide x 6-12 cycles       Biomarkers:  MGMT Unknown.  IDH 1/2 Wild type.  EGFR Unknown  TERT Unknown   Interval History: Denise Wright presents today to establish care in brain tumor clinic for her glioblastoma.  She describes considerable improvement in left sided weakness, overall functional status since completing inpatient rehab course.  She is now doing some walking on her own around the home, although typically is relying on a walker.  She is able to button a shirt, tie her shoes, and dresses/toilets without any assistance.  Denies seizures or headaches today.  H+P (07/20/21) Patient presented to medical attention last month with several days, weeks of progressive mental status changes.  Family noticed she was confused and disoriented over normal conversation and activities of daily living.  Baseline she is functionally independent, lives alone, works full time with Museum/gallery exhibitions officer company.  CNS imaging demonstrated an enhancing mass in the right temporal lobe; she underwent craniotomy, resection with Dr. Reatha Armour on 07/06/21, which  demonstrated glioblastoma.  Following surgery she experienced worsening left sided weakness, which prompted rehabilitation admission.  At present, she is doing some walking on her own, albeit slowly.  Left arm and hand have improved, she is starting occupational therapy this week.  Medications: Current Outpatient Medications on File Prior to Visit  Medication Sig Dispense Refill   acetaminophen (TYLENOL) 325 MG tablet Take 2 tablets (650 mg total) by mouth every 6 (six) hours as needed for mild pain (or Fever >/= 101).     ALPRAZolam (XANAX) 0.25 MG tablet Take 1 tablet (0.25 mg total) by mouth 2 (two) times daily as needed for anxiety. 10 tablet 0   b complex vitamins capsule Take 1 capsule by mouth daily.     cholecalciferol (VITAMIN D) 1000 UNITS tablet Take 2,000 Units by mouth daily.     dexamethasone (DECADRON) 2 MG tablet 2 mg every 8 hours x3 days then 2 mg twice daily x3 days then 2 mg daily x3 days and stop 18 tablet 0   docusate sodium (COLACE) 100 MG capsule Take 1 capsule (100 mg total) by mouth 2 (two) times daily. 10 capsule 0   famotidine (PEPCID) 20 MG tablet Take 1 tablet (20 mg total) by mouth daily. 30 tablet 0   fluticasone (FLONASE) 50 MCG/ACT nasal spray Place 1 spray into both nostrils daily as needed for allergies. OTC     hydrochlorothiazide (HYDRODIURIL) 12.5 MG tablet Take 0.5 tablets (6.25 mg total) by mouth daily. 30 tablet 0   HYDROcodone-acetaminophen (NORCO/VICODIN) 5-325 MG tablet Take 1 tablet by mouth every 4 (four) hours as needed for moderate pain. (Patient not taking: Reported on 08/01/2021) 30 tablet 0   levothyroxine (SYNTHROID) 50  MCG tablet Take 1 tablet (50 mcg total) by mouth daily. 30 tablet 0   Multiple Vitamins-Minerals (MULTIVITAMIN WITH MINERALS) tablet Take 1 tablet by mouth daily.     ondansetron (ZOFRAN) 8 MG tablet Take 1 tablet (8 mg total) by mouth 2 (two) times daily as needed (nausea and vomiting). May take 30-60 minutes prior to Temodar  administration if nausea/vomiting occurs. 30 tablet 1   temozolomide (TEMODAR) 140 MG capsule Take 1 capsule (140 mg total) by mouth daily. May take on an empty stomach to decrease nausea & vomiting. 42 capsule 0   No current facility-administered medications on file prior to visit.    Allergies:  Allergies  Allergen Reactions   Fexofenadine Hives   Penicillins Itching and Other (See Comments)    Pulling sensation in body   Past Medical History:  Past Medical History:  Diagnosis Date   Hyperlipidemia    Hypertension    Pneumonia    Thyroid disease    Past Surgical History:  Past Surgical History:  Procedure Laterality Date   ABDOMINAL HYSTERECTOMY  7371   APPLICATION OF CRANIAL NAVIGATION Right 07/06/2021   Procedure: APPLICATION OF CRANIAL NAVIGATION;  Surgeon: Karsten Ro, DO;  Location: [facility];  Service: Neurosurgery;  Laterality: Right;   CRANIOTOMY Right 07/06/2021   Procedure: CRANIOTOMY TUMOR EXCISION;  Surgeon: Karsten Ro, DO;  Location: [facility];  Service: Neurosurgery;  Laterality: Right;   TONSILLECTOMY  1965   TOOTH EXTRACTION     Social History:  Social History   Socioeconomic History   Marital status: Divorced    Spouse name: Not on file   Number of children: 3   Years of education: 20   Highest education level: Not on file  Occupational History   Occupation: Masters degree in Psychologist, counselling: [employer]   Occupation: Working on Engineer, maintenance   Occupation: Training and development officer of [employer]  Tobacco Use   Smoking status: Never   Smokeless tobacco: Never  Vaping Use   Vaping Use: Never used  Substance and Sexual Activity   Alcohol use: No   Drug use: No   Sexual activity: Yes    Birth control/protection: Surgical  Other Topics Concern   Not on file  Social History Narrative   Divorced, lives in [place] alone.  Independent of ADLs.   Social Determinants of Health   Financial Resource  Strain: Not on file  Food Insecurity: Not on file  Transportation Needs: Not on file  Physical Activity: Not on file  Stress: Not on file  Social Connections: Not on file  Intimate Partner Violence: Not on file   Family History:  Family History  Problem Relation Age of Onset   Diabetes Mother    Hypertension Mother    Hypertension Father    Leukemia Father    Stroke Father    Cancer Brother    Pneumonia Brother     Review of Systems: Constitutional: Doesn't report fevers, chills or abnormal weight loss Eyes: Doesn't report blurriness of vision Ears, nose, mouth, throat, and face: Doesn't report sore throat Respiratory: Doesn't report cough, dyspnea or wheezes Cardiovascular: Doesn't report palpitation, chest discomfort  Gastrointestinal:  Doesn't report nausea, constipation, diarrhea GU: Doesn't report incontinence Skin: Doesn't report skin rashes Neurological: Per HPI Musculoskeletal: Doesn't report joint pain Behavioral/Psych: Doesn't report anxiety  Physical Exam: Wt Readings from Last 3 Encounters:  08/01/21 224 lb (101.6 kg)  07/28/21 215 lb 6.4 oz (  97.7 kg)  07/25/21 216 lb 9.6 oz (98.2 kg)   Temp Readings from Last 3 Encounters:  08/01/21 97.6 F (36.4 C)  07/28/21 (!) 97.2 F (36.2 C)  07/25/21 97.8 F (36.6 C)   BP Readings from Last 3 Encounters:  08/01/21 127/75  07/28/21 (!) 110/54  07/25/21 (!) 106/58   Pulse Readings from Last 3 Encounters:  08/01/21 86  07/28/21 89  07/25/21 87   KPS: 70 ECOG 1 General: Alert, cooperative, pleasant, in no acute distress Head: Normal EENT: No conjunctival injection or scleral icterus.  Lungs: Resp effort normal Cardiac: Regular rate Abdomen: Non-distended abdomen Skin: No rashes cyanosis or petechiae. Extremities: 2+ dependent edema  Neurologic Exam: Mental Status: Awake, alert, attentive to examiner. Oriented to self and environment. Language is fluent with intact comprehension.  Cranial Nerves:  Visual acuity is grossly normal. Visual fields are full. Extra-ocular movements intact. No ptosis. Face is symmetric Motor: Tone and bulk are normal. Power is 4+/5 in left arm and leg. Reflexes are symmetric, no pathologic reflexes present.  Sensory: Intact to light touch Gait: Hemiparetic   Labs: I have reviewed the data as listed    Component Value Date/Time   NA 136 07/20/2021 0603   K 4.6 07/20/2021 0603   CL 102 07/20/2021 0603   CO2 27 07/20/2021 0603   GLUCOSE 150 (H) 07/20/2021 0603   BUN 23 07/20/2021 0603   CREATININE 1.11 (H) 07/20/2021 0603   CALCIUM 8.8 (L) 07/20/2021 0603   PROT 5.5 (L) 07/13/2021 0525   ALBUMIN 2.3 (L) 07/13/2021 0525   AST 14 (L) 07/13/2021 0525   ALT 36 07/13/2021 0525   ALKPHOS 74 07/13/2021 0525   BILITOT 0.3 07/13/2021 0525   GFRNONAA 54 (L) 07/20/2021 0603   GFRAA 70 (L) 07/17/2011 0311   Lab Results  Component Value Date   WBC 8.9 07/17/2021   NEUTROABS 10.4 (H) 07/13/2021   HGB 10.3 (L) 07/17/2021   HCT 31.4 (L) 07/17/2021   MCV 93.2 07/17/2021   PLT 257 07/17/2021    Imaging:  MR BRAIN W WO CONTRAST  Result Date: 07/07/2021 CLINICAL DATA:  Brain/CNS neoplasm, staging postop EXAM: MRI HEAD WITHOUT AND WITH CONTRAST TECHNIQUE: Multiplanar, multiecho pulse sequences of the brain and surrounding structures were obtained without and with intravenous contrast. CONTRAST:  9.54m GADAVIST GADOBUTROL 1 MMOL/ML IV SOLN COMPARISON:  Preoperative study 06/30/2021 FINDINGS: Brain: Postoperative changes of frontotemporal mass resection. Fluid, blood products, and air present within the resection cavity. Extra-axial collection is present underlying the craniotomy. There is bifrontal extra-axial air. Intrinsic T1 hyperintense blood products are present. There is some superimposed enhancement at the resection cavity margins that is likely postoperative. Mild nodularity is present inferiorly (series 23, image 18). Remains surrounding T2 FLAIR  hyperintensity similar in extent to the preoperative study. Mass effect remains present including effacement the right lateral ventricle and mild leftward midline shift. There is no hydrocephalus. Vascular: Major vessel flow voids at the skull base are preserved. Skull and upper cervical spine: Normal marrow signal is preserved. Sinuses/Orbits: Minor mucosal thickening.  Orbits are unremarkable. Other: Sella is unremarkable.  Mastoid air cells are clear. IMPRESSION: Expected postoperative changes of gross total resection of right frontotemporal mass. Attention on follow-up recommended for mildly nodular enhancement at the inferior margin. Persistent mass effect with mild leftward midline shift. Electronically Signed   By: PMacy MisM.D.   On: 07/07/2021 16:41     Assessment/Plan 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[place]  Goals  of care, counseling/discussion  Denise Wright is clinically stable today, now having completed course of inpatient rehabilitation.  Dexamethasone has been fully weaned off.  We had further conversation with her and her family regarding pathology, prognosis, and available treatment pathways for Glioblastoma.  We ultimately recommended proceeding with course of intensity modulated radiation therapy and concurrent daily Temozolomide.  Radiation will be administered Mon-Fri over 6 weeks, Temodar will be dosed at 69m/m2 to be given daily over 42 days.  We reviewed side effects of temodar, including fatigue, nausea/vomiting, constipation, and cytopenias.  Informed consent was verbally obtained at bedside to proceed with oral chemotherapy, which will begin 08/07/21.  Chemotherapy should be held for the following:  ANC less than 1,000  Platelets less than 100,000  LFT or creatinine greater than 2x ULN  If clinical concerns/contraindications develop  Every 2 weeks during radiation, labs will be checked accompanied by a clinical evaluation in the  brain tumor clinic.  We also discussed her potential eligibility for in-house clinical trial: Testing Ramipril to Prevent Memory Loss in People with Glioblastoma.  She would like to obtain more information from the research team at this time.  She has recovered adequately from surgery to participate in this study.  All questions were answered. The patient knows to call the clinic with any problems, questions or concerns. No barriers to learning were detected.  The total time spent in the encounter was 45 minutes and more than 50% was on counseling and review of test results   ZVentura Sellers MD Medical Director of Neuro-Oncology [facility]02/23/23 10:22 AM

## 2021-08-03 NOTE — Research (Signed)
[id number] A Single Arm, Pilot Study of Ramipril for Preventing Radiation-Induced Cognitive Decline in Glioblastoma (GBM) Patients Receiving Brain Radiotherapy  This Nurse has reviewed this patient's inclusion and exclusion criteria as a second review and confirms Denise Wright is eligible for study participation.  Patient may continue with enrollment.  Foye Spurling, BSN, RN, [employer] Research Nurse II 08/03/2021 2:28 PM

## 2021-08-03 NOTE — Research (Signed)
[id number] A Single Arm, Pilot Study of Ramipril for Preventing Radiation-Induced Cognitive Decline in Glioblastoma (GBM) Patients Receiving Brain Radiotherapy  ELIGIBILITY CHECK  This Nurse has reviewed this patient's inclusion and exclusion criteria and confirmed Denise Wright is eligible for study participation.  Patient will continue with enrollment.  Menopausal status (women only): Denise Wright is post menopausal with LMP in 1983 when she had a hysterectomy, confirmed with patient today (08/03/21).  Reviewed all exclusion criteria with patient today who verbally denied all.  Review medical history, medication, and allergies with patient.  No changes.  Patient states she is able to give informed consent today, and read/speaks/understands English.  BP today during MD Vaslow's visit- 119/53.  Patient denies any prior cancers or prior cancer treatment.  Per MD Vaslow use of Optune is not included in pt's planned treatment at this time.  Confirmed her height today of 5' 2.5".  Patient completed her PROs and Neurocognitive testing today with Denise Wright.  Drug distribution scheduled for 08/07/2021 during/after pt's initial RT appt.  Wells Guiles 'Learta CoddingNeysa Bonito, RN, BSN Clinical Research Nurse I 08/03/21 3:41 PM

## 2021-08-03 NOTE — Research (Signed)
DCP-001: Use of a Clinical Trial Screening Tool to Address Cancer Health Disparities in the [facility])  CONSENT  Patient Denise Wright was identified by this Clinical Research Nurse as a potential candidate for the above listed study.  This Clinical Research Nurse met with BAO COREAS, [id number], on 08/03/21 in a manner and location that ensures patient privacy to discuss participation in the above listed research study.  Patient is Accompanied by her daughter [name] .  A copy of the informed consent document and separate HIPAA Authorization was provided to the patient.  Patient reads, speaks, and understands [place].    Patient was provided with the business card of this Nurse and encouraged to contact the research team with any questions.  Patient was provided the option of taking informed consent documents home to review and was encouraged to review at their convenience with their support network, including other care providers. Patient is comfortable with making a decision regarding study participation today.  As outlined in the informed consent form, this Nurse and Hannah Beat Schueler discussed the purpose of the research study, the investigational nature of the study, study procedures and requirements for study participation, potential risks and benefits of study participation, as well as alternatives to participation. This study is not blinded. The patient understands participation is voluntary and they may withdraw from study participation at any time.  This study does not involve randomization.  This study does not involve an investigational drug or device. This study does not involve a placebo. Patient understands enrollment is pending full eligibility review.   Confidentiality and how the patient's information will be used as part of study participation were discussed.  Patient was informed there is not reimbursement provided for their time and effort  spent on trial participation.  The patient is encouraged to discuss research study participation with their insurance provider to determine what costs they may incur as part of study participation, including research related injury.    All questions were answered to patient's satisfaction.  The informed consent and separate HIPAA Authorization was reviewed page by page.  The patient's mental and emotional status is appropriate to provide informed consent, and the patient verbalizes an understanding of study participation.  Patient has agreed to participate in the above listed research study and has voluntarily signed the informed consent version date 03/21/21 ([facility] version date 07/06/21) and separate HIPAA Authorization, version date 05/27/19 ([facility] version date 04/05/21)  on 08/03/21 at 11:39 AM.  The patient was provided with a copy of the signed informed consent form and separate HIPAA Authorization for their reference.  No study specific procedures were obtained prior to the signing of the informed consent document.  Approximately 25 minutes were spent with the patient reviewing the informed consent documents.  Patient was not requested to complete a Release of Information form.  Wells Guiles 'Learta CoddingNeysa Bonito, RN, BSN Clinical Research Nurse I 08/03/21 1:39 PM

## 2021-08-06 DIAGNOSIS — C712 Malignant neoplasm of temporal lobe: Secondary | ICD-10-CM | POA: Diagnosis not present

## 2021-08-07 ENCOUNTER — Ambulatory Visit
Admission: RE | Admit: 2021-08-07 | Discharge: 2021-08-07 | Disposition: A | Discharge status: Home / Self Care | Payer: BC Managed Care – PPO | Source: Ambulatory Visit | Attending: Radiation Oncology | Admitting: Radiation Oncology

## 2021-08-07 ENCOUNTER — Inpatient Hospital Stay: Payer: BC Managed Care – PPO | Admitting: Emergency Medicine

## 2021-08-07 ENCOUNTER — Other Ambulatory Visit: Payer: Self-pay | Admitting: Emergency Medicine

## 2021-08-07 ENCOUNTER — Other Ambulatory Visit: Payer: Self-pay

## 2021-08-07 DIAGNOSIS — C719 Malignant neoplasm of brain, unspecified: Secondary | ICD-10-CM

## 2021-08-07 DIAGNOSIS — C712 Malignant neoplasm of temporal lobe: Secondary | ICD-10-CM | POA: Diagnosis not present

## 2021-08-07 MED ORDER — INV-RAMIPRIL 1.25 MG CAP WF-1801 (#133): 1.2500 mg | ORAL_CAPSULE | Freq: Every day | ORAL | 0 refills | Status: DC

## 2021-08-07 NOTE — Research (Signed)
[id number] A Single Arm, Pilot Study of Ramipril for Preventing Radiation-Induced Cognitive Decline in Glioblastoma (GBM) Patients Receiving Brain Radiotherapy  STUDY DRUG DISTRIBUTION VISIT  Confirmed correct patient identifiers with Laddie Aquas, PharmD today before distribution to patient.  Distributed 1 bottle of study drug with the following information to patient (accompanied by daughter):  WF [id number] No. [id number] Lot: [id number] Exp. 03/10/2022 Ramipril 1.25 mg capsules 133 capsules  Patient was provided with administration and drug diary education.  Patient was given drug diaries.  Patient verbalizes understanding to start taking 1 pill daily of study drug starting tonight (08/07/2021).  Pt denies any questions or concerns at this time but is aware to f/u as needed.  Wells Guiles 'Learta CoddingNeysa Bonito, RN, BSN Clinical Research Nurse I 08/07/21 4:28 PM

## 2021-08-08 ENCOUNTER — Ambulatory Visit: Payer: BC Managed Care – PPO | Admitting: Internal Medicine

## 2021-08-08 ENCOUNTER — Ambulatory Visit
Admission: RE | Admit: 2021-08-08 | Discharge: 2021-08-08 | Disposition: A | Discharge status: Home / Self Care | Payer: BC Managed Care – PPO | Source: Ambulatory Visit | Attending: Radiation Oncology | Admitting: Radiation Oncology

## 2021-08-08 ENCOUNTER — Encounter: Payer: Self-pay | Admitting: Internal Medicine

## 2021-08-08 DIAGNOSIS — C712 Malignant neoplasm of temporal lobe: Secondary | ICD-10-CM | POA: Diagnosis not present

## 2021-08-09 ENCOUNTER — Other Ambulatory Visit: Payer: Self-pay

## 2021-08-09 ENCOUNTER — Ambulatory Visit
Admission: RE | Admit: 2021-08-09 | Discharge: 2021-08-09 | Disposition: A | Discharge status: Home / Self Care | Payer: BC Managed Care – PPO | Source: Ambulatory Visit | Attending: Radiation Oncology | Admitting: Radiation Oncology

## 2021-08-09 DIAGNOSIS — C719 Malignant neoplasm of brain, unspecified: Secondary | ICD-10-CM | POA: Diagnosis not present

## 2021-08-10 ENCOUNTER — Ambulatory Visit
Admission: RE | Admit: 2021-08-10 | Discharge: 2021-08-10 | Disposition: A | Discharge status: Home / Self Care | Payer: BC Managed Care – PPO | Source: Ambulatory Visit | Attending: Radiation Oncology | Admitting: Radiation Oncology

## 2021-08-10 ENCOUNTER — Inpatient Hospital Stay: Payer: BC Managed Care – PPO

## 2021-08-10 ENCOUNTER — Telehealth: Payer: Self-pay | Admitting: Emergency Medicine

## 2021-08-10 ENCOUNTER — Other Ambulatory Visit: Payer: Self-pay

## 2021-08-10 DIAGNOSIS — Z90710 Acquired absence of both cervix and uterus: Secondary | ICD-10-CM | POA: Insufficient documentation

## 2021-08-10 DIAGNOSIS — C719 Malignant neoplasm of brain, unspecified: Secondary | ICD-10-CM | POA: Diagnosis not present

## 2021-08-10 DIAGNOSIS — C712 Malignant neoplasm of temporal lobe: Secondary | ICD-10-CM | POA: Insufficient documentation

## 2021-08-10 DIAGNOSIS — Z806 Family history of leukemia: Secondary | ICD-10-CM | POA: Insufficient documentation

## 2021-08-10 DIAGNOSIS — I10 Essential (primary) hypertension: Secondary | ICD-10-CM | POA: Insufficient documentation

## 2021-08-10 LAB — RESEARCH LABS

## 2021-08-10 NOTE — Telephone Encounter (Signed)
[id number] A Single Arm, Pilot Study of Ramipril for Preventing Radiation-Induced Cognitive Decline in Glioblastoma (GBM) Patients Receiving Brain Radiotherapy ? ?Contacted patient's daughter [name] to see if patient can come in a bit earlier than RT appt to have labs drawn for study.  [name] agreed to lab appt at around 3:30, appt created, orders in. ? ?Wells Guiles 'Learta CoddingNeysa Bonito, RN, BSN ?Clinical Research Nurse I ?08/10/21 ?9:24 AM ? ?

## 2021-08-11 ENCOUNTER — Encounter: Payer: Self-pay | Admitting: Occupational Therapy

## 2021-08-11 ENCOUNTER — Encounter: Payer: Self-pay | Admitting: Physical Therapy

## 2021-08-11 ENCOUNTER — Ambulatory Visit
Admission: RE | Admit: 2021-08-11 | Discharge: 2021-08-11 | Disposition: A | Discharge status: Home / Self Care | Payer: BC Managed Care – PPO | Source: Ambulatory Visit | Attending: Radiation Oncology | Admitting: Radiation Oncology

## 2021-08-11 ENCOUNTER — Ambulatory Visit: Payer: BC Managed Care – PPO | Attending: Internal Medicine | Admitting: Occupational Therapy

## 2021-08-11 ENCOUNTER — Ambulatory Visit: Payer: BC Managed Care – PPO

## 2021-08-11 ENCOUNTER — Ambulatory Visit: Payer: BC Managed Care – PPO | Admitting: Physical Therapy

## 2021-08-11 VITALS — BP 138/80

## 2021-08-11 DIAGNOSIS — R2689 Other abnormalities of gait and mobility: Secondary | ICD-10-CM | POA: Diagnosis present

## 2021-08-11 DIAGNOSIS — R471 Dysarthria and anarthria: Secondary | ICD-10-CM | POA: Diagnosis present

## 2021-08-11 DIAGNOSIS — R41844 Frontal lobe and executive function deficit: Secondary | ICD-10-CM | POA: Insufficient documentation

## 2021-08-11 DIAGNOSIS — R278 Other lack of coordination: Secondary | ICD-10-CM | POA: Insufficient documentation

## 2021-08-11 DIAGNOSIS — M6281 Muscle weakness (generalized): Secondary | ICD-10-CM

## 2021-08-11 DIAGNOSIS — R2681 Unsteadiness on feet: Secondary | ICD-10-CM

## 2021-08-11 DIAGNOSIS — R41840 Attention and concentration deficit: Secondary | ICD-10-CM | POA: Insufficient documentation

## 2021-08-11 DIAGNOSIS — R41841 Cognitive communication deficit: Secondary | ICD-10-CM | POA: Insufficient documentation

## 2021-08-11 DIAGNOSIS — R41842 Visuospatial deficit: Secondary | ICD-10-CM | POA: Diagnosis present

## 2021-08-11 DIAGNOSIS — C719 Malignant neoplasm of brain, unspecified: Secondary | ICD-10-CM

## 2021-08-11 MED ORDER — SONAFINE EX EMUL
1.0000 "application " | Freq: Once | CUTANEOUS | Status: AC
  Administered 2021-08-11: 1 via TOPICAL

## 2021-08-11 NOTE — Therapy (Signed)
OUTPATIENT PHYSICAL THERAPY NEURO EVALUATION   Patient Name: Denise Wright MRN: [id number] DOB:1950-10-02, 71 y.o., female Today's Date: 08/11/2021  PCP: Willey Blade, MD REFERRING PROVIDER: Cathlyn Parsons, PA-C    PT End of Session - 08/11/21 1427     Visit Number 1    Number of Visits 15   14 + eval   Date for PT Re-Evaluation 10/20/21    Progress Note Due on Visit 10    PT Start Time 1102    PT Stop Time 1146    PT Time Calculation (min) 44 min    Equipment Utilized During Treatment Gait belt    Activity Tolerance Patient tolerated treatment well;Patient limited by fatigue    Behavior During Therapy WFL for tasks assessed/performed             Past Medical History:  Diagnosis Date   Hyperlipidemia    Hypertension    Pneumonia    Thyroid disease    Past Surgical History:  Procedure Laterality Date   ABDOMINAL HYSTERECTOMY  2426   APPLICATION OF CRANIAL NAVIGATION Right 07/06/2021   Procedure: APPLICATION OF CRANIAL NAVIGATION;  Surgeon: Karsten Ro, DO;  Location: [place];  Service: Neurosurgery;  Laterality: Right;   CRANIOTOMY Right 07/06/2021   Procedure: CRANIOTOMY TUMOR EXCISION;  Surgeon: Karsten Ro, DO;  Location: [place];  Service: Neurosurgery;  Laterality: Right;   TONSILLECTOMY  1965   TOOTH EXTRACTION     Patient Active Problem List   Diagnosis Date Noted   Goals of care, counseling/discussion 07/20/2021   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[place]) 07/12/2021   Brain mass 06/30/2021   Hypothyroidism 06/30/2021   Hypokalemia 06/30/2021   Essential hypertension 05/25/2021   Hyperglycemia 07/14/2011   Normocytic anemia 07/13/2011    ONSET DATE: 07/19/2021   REFERRING DIAG: D49.6 (ICD-10-CM) - Brain tumor ([place])   THERAPY DIAG:  Other abnormalities of gait and mobility  Muscle weakness (generalized)  Unsteadiness on feet  SUBJECTIVE:                                                                                                                                                                                               SUBJECTIVE STATEMENT: Pt is completing her first full week of chemo and radiation, has 6 weeks total.  She is unsure how she is supposed to feel though she is tired.  Has fallen within last 2 weeks in home, sons had to help her up.  She is worried about continuing to fall.    Pt accompanied by: family member-daughter; [name]  PERTINENT HISTORY: HTN, hypothyroidism,  hyperglycemia, normocytic anemia, glioblastoma with isocitrate dehydrogenase gene wildtype; pt had R frontotemporal craniotomy for resection of supratentorial tumor on 07/06/2021.  PAIN:  Are you having pain? Yes NPRS scale: 6/10 Pain location: Left ankle Pain orientation: Other: all over ankle   PAIN TYPE: aching Pain description: intermittent, dull, and aching  Aggravating factors: Standing Relieving factors: Elevation, pain meds from doctor No MOI; pain dec with walking between therapy appts  PRECAUTIONS: Fall  WEIGHT BEARING RESTRICTIONS No  FALLS: Has patient fallen in last 6 months? Yes, Number of falls: 2  One fall d/t tripping over obstacle that she did not see; second she was in bathroom, stood from toilet, fell, does not remember being dizzy  LIVING ENVIRONMENT: Lives with: lives alone and Has 24/hr care provided by 2 sons and daughter Lives in: House/apartment Stairs: Yes; External: 6 steps; on right going up, on left going up, and can reach both-pt has additional 2 steps from driveway to walkway leading to external stairs Has following equipment at home: Environmental consultant - 2 wheeled, Electronics engineer, and Grab bars  PLOF: Independent, Leisure: traveling, go out with the girls, and On medical leave as Development worker, 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  MUSCLE TONE: None noted in LE  POSTURE: rounded shoulders, forward head, and increased thoracic kyphosis  AROM/PROM:    AROM Right 08/11/2021 Left 08/11/2021  Hip flexion [facility]  Hip extension " "  Hip abduction " "  Hip adduction " "  Hip internal rotation " "  Hip external rotation " "  Knee flexion " "  Knee extension " "  Ankle dorsiflexion " "  Ankle plantarflexion " "   (Blank rows = not tested) MMT: Left shoulder 4-/5 MMT Right 08/11/2021 Left 08/11/2021  Hip flexion 4/5 3/5  Hip extension    Hip abduction 4/5 4/5  Hip adduction 5/5 5/5  Hip internal rotation    Hip external rotation    Knee flexion 4-/5 3+/5  Knee extension 4+/5 4+/5  Ankle dorsiflexion 5/5 4+/5; no pain  Ankle plantarflexion    Ankle inversion    Ankle eversion    (Blank rows = not tested)  TRANSFERS: Assistive device utilized: Environmental consultant - 2 wheeled  Sit to stand: Modified independence Stand to sit: Modified independence  STAIRS: 6", 4 step up using bilateral rails, step-to pattern, up with LLE, down with RLE.  Edu on safe stair negotiation, "up with the good, down with the bad" to prevent falls on steps into home.    GAIT: Gait pattern: step to pattern, decreased stride length, and trunk flexed Distance walked: Several bouts in and around gym for balance and fall risk assessments. Assistive device utilized: Environmental consultant - 2 wheeled Level of assistance: SBA  FUNCTIONAL TESTs:  5 times sit to stand: 15.91 sec Timed up and go (TUG): 19.53 sec w/ RW 10 meter  walk test: (normal)=0.56 m/sec or 1.67ft/sec; (Fast)=.77 m/sec or 2.56 ft/sec  w/ RW   PATIENT EDUCATION: Education details: Edu on safe stair navigation at home, POC, goals assessed. Person educated: Patient Education method: Explanation Education comprehension: verbalized understanding     ASSESSMENT:  CLINICAL IMPRESSION: Patient is a 71 y.o. female who was seen today for physical therapy evaluation and treatment for functional mobility deficits following R frontotemporal craniotomy of glioblastoma on 07/06/2021.  Pt has a significant PMH of HTN, hypothyroidism, hyperglycemia, normocytic anemia, glioblastoma with isocitrate dehydrogenase gene wildtype.  Identified impairments include generalized weakness with inc weakness of the LLE, decreased gait speed, increased distractibility during functional tasks, and stair negotiation.  When assessed, 5xSTS, TUG, and 10MWT indicate fall risk.  10MWT indicates she is a household ambulator.  They would benefit from skilled PT to address impairments as noted and progress towards long term goals.     OBJECTIVE IMPAIRMENTS Abnormal gait, decreased activity tolerance, decreased balance, decreased endurance, decreased mobility, difficulty walking, decreased strength, and increased edema.   ACTIVITY LIMITATIONS cleaning, community activity, driving, meal prep, occupation, laundry, yard work, and shopping.   PERSONAL FACTORS HTN, hypothyroidism, hyperglycemia are also affecting patient's functional outcome.    REHAB POTENTIAL: Good  CLINICAL DECISION MAKING: Evolving/moderate complexity  EVALUATION COMPLEXITY: Moderate   GOALS: Goals reviewed with patient? Yes  SHORT TERM GOALS:  Pt will be independent with strength and balance HEP with supervision from family. Baseline: to be initiated at second visit Target date: 09/15/2021 Goal status: INITIAL  2.  Pt will decrease 5xSTS to <13 seconds in order to demonstrate decreased risk for falls and  improved functional bilateral LE strength and power. Baseline: 15.91 sec Target date: 09/15/2021 Goal status: INITIAL  3.  Pt will demonstrate TUG of <16 seconds in order to decrease risk of falls and improve functional mobility using LRAD. Baseline: 19.53 seconds Target date: 09/15/2021 Goal status: INITIAL  4.  Pt will demonstrate a gait speed of >2 feet/sec at normal pace w/ LRAD in order to decrease risk for falls.  Baseline: 1.86 ft/sec w/ RW Target date: 09/15/2021 Goal status: INITIAL  LONG TERM GOALS:  PT to assess BERG and set goal. Baseline: not assessed on eval Target date: 10/20/2021 Goal status: INITIAL  2.  PT to assess 2MWT and set goal. Baseline: not assessed on eval Target date: 10/20/2021 Goal status: INITIAL  3.  Pt will ambulate up eight 6" stairs using unilateral rails w/ step-through pattern. Baseline: Pt ambulates up 4 stairs using bilateral rails w/ step-to pattern. Target date: 10/20/2021 Goal status: INITIAL  4.  Pt will ambulate 500 feet with LRAD and SBA level of assist to promote household and community access.  Baseline: household ambulator Target date: 10/20/2021 Goal status: INITIAL  5.  Pt will report that she has returned to community exercise program.  Baseline: does not currently attend Target date: 10/20/2021 Goal status: INITIAL  PLAN: PT FREQUENCY:  1x/wk for 6 weeks then 2x/wk for 4 weeks  PT DURATION: 10 weeks  PLANNED INTERVENTIONS: Therapeutic exercises, Therapeutic activity, Neuromuscular re-education, Balance training, Gait training, Patient/Family education, Stair training, Vestibular training, and DME instructions  PLAN FOR NEXT SESSION: Assess BERG and 2MWT.  Initiate balance and strengthening HEP.   Bary Richard, PT, DPT 08/11/2021, 2:30 PM

## 2021-08-11 NOTE — Therapy (Signed)
[facility] [address] [place], [place], [address] Phone: [phone]   Fax:  [phone]  Speech Language Pathology Evaluation  Patient Details  Name: Denise Wright MRN: [id number] Date of Birth: [birth year] Referring Provider (SLP): Denise Wright, [place]   Encounter Date: 08/11/2021   End of Session - 08/11/21 1501     Visit Number 1    Number of Visits 25    Date for SLP Re-Evaluation 11/09/21    SLP Start Time 1016    SLP Stop Time  1100    SLP Time Calculation (min) 44 min    Activity Tolerance Patient tolerated treatment well             Past Medical History:  Diagnosis Date   Hyperlipidemia    Hypertension    Pneumonia    Thyroid disease     Past Surgical History:  Procedure Laterality Date   ABDOMINAL HYSTERECTOMY  5093   APPLICATION OF CRANIAL NAVIGATION Right 07/06/2021   Procedure: APPLICATION OF CRANIAL NAVIGATION;  Surgeon: Karsten Ro, DO;  Location: [place];  Service: Neurosurgery;  Laterality: Right;   CRANIOTOMY Right 07/06/2021   Procedure: CRANIOTOMY TUMOR EXCISION;  Surgeon: Karsten Ro, DO;  Location: [place];  Service: Neurosurgery;  Laterality: Right;   TONSILLECTOMY  1965   TOOTH EXTRACTION      There were no vitals filed for this visit.   Subjective Assessment - 08/11/21 1030     Subjective Re: appointment tracking "My problem is that I have trouble figuring out what day it is."  "i got a ding for this appointment today and thought what's that dinging me for? Then I looked at it and said, 'Oh that's what it's dinging me for.'"                SLP Evaluation [facility] - 08/11/21 1236       SLP Visit Information   SLP Received On 08/10/21    Referring Provider (SLP) Denise Rinne, PA-C    Onset Date 06-29-21    Medical Diagnosis Glioblastoma resection      Subjective   Patient/Family Stated Goal Improve skills to go back to work      Pain Assessment    Currently in Pain? Yes    Pain Score 8     Pain Location Ankle    Pain Orientation Left    Pain Type Acute pain      General Information   HPI pt      Balance Screen   Has the patient fallen in the past 6 months Yes    How many times? 2    Has the patient had a decrease in activity level because of a fear of falling?  No    Is the patient reluctant to leave their home because of a fear of falling?  No      Prior Functional Status   Cognitive/Linguistic Baseline Within functional limits    Type of Home House     Lives With Alone    Available Support Family    Vocation Full time employment      Cognition   Overall Cognitive Status Impaired/Different from baseline    Area of Impairment Attention;Awareness;Memory;Orientation    Orientation Level Time   pt remarked she had ongoing difficulty knowing the day of the week   Attention Comments Suspect pt processing is impaired as pt took exactly 2 minutes to complete symbol cancellation subtest, and 2  minutes 45 seconds to complete clock drawing. SLP suspects pt's higher level attention (alternating,    Memory Comments Pt is not independently performing med adminstration "It's about 50-50" Denise Wright said. She also requires cues for some medical appointments as Denise Wright (daughter) stated she sometimes needs to tell pt when she has appointments. Pt has appointment calendar at home but is not crossing off dates and Denise Wright endorses difficulty with temporal orientation (weekday orientation). SLP suggested having pt cross off the day prior to going to bed, in order to assist with weekday orientation and thus appointment tracking.    Awareness Comments Intellectual awareness impaired - pt could not ID/verbally provide any cognitive deficits. Additionally, emergent awareness is imaired as pt did not note errors with clock drawing, nor in symbol cancellation until SLP drew her attention to them. After that, however pt completed 12/12 cancels.    Behaviors Other  (comment)      Auditory Comprehension   Overall Auditory Comprehension Appears within functional limits for tasks assessed      Verbal Expression   Overall Verbal Expression Appears within functional limits for tasks assessed      Motor Speech   Overall Motor Speech Appears within functional limits for tasks assessed   However Denise Wright states when pt is very fatigued speech will become minimally dysarthric     Standardized Assessments   Standardized Assessments  --   Portions of Cognitive Linguistic Quick Test                         ADULT SLP TREATMENT - 08/11/21 0001       Treatment Provided   Treatment provided Dysphagia               SLP Education - 08/11/21 1500     Education Details memory compensations for appointments and for medication    Person(s) Educated Patient;Child(ren)    Methods Explanation    Comprehension Verbalized understanding;Need further instruction              SLP Short Term Goals - 08/11/21 1518       SLP SHORT TERM GOAL #1   Title pt will complete standardized testing    Time 2    Period Weeks    Status New    Target Date 08/25/21      SLP SHORT TERM GOAL #2   Title Pt will verbalize 3 attention/memory compensations for making/tracking appointments, managing meds, and other daily tasks in 2 sessions    Time 4    Period Weeks    Status New    Target Date 09/08/21      SLP SHORT TERM GOAL #3   Title pt will verbally generate and carryover (with min A) solutions to problems encountered in everyday living in 2 sessions    Time 6    Period Weeks    Status New    Target Date 09/22/21      SLP SHORT TERM GOAL #4   Title pt will verbally state errors on mod complex written or verbal tasks with nonverbal cues    Time 6    Period Weeks    Status New    Target Date 09/22/21              SLP Long Term Goals - 08/11/21 1520       SLP LONG TERM GOAL #1   Title Pt will verbalize AND IMPLEMEMENT 3  attention/memory compensations for making/tracking appointments,  managing meds, and other daily tasks in 4 sessions    Time 8    Period Weeks    Status New    Target Date 10/06/21      SLP LONG TERM GOAL #2   Title Pt will score higher on a patient-reported outcome measure in the last 1-2 visits than when taken in the first 2 sessions    Time 12    Period Weeks    Status New    Target Date 11/09/21      SLP LONG TERM GOAL #3   Title pt will verbally generate and independently carryover solutions to problems encountered in everyday living with min A in 4 sessions    Time 12    Period Weeks    Status New    Target Date 11/09/21      SLP LONG TERM GOAL #4   Title pt will note verbal or written errors in work-like tasks independently for improving verbal and/or written accuracy    Time 12    Period Weeks    Status New    Target Date 11/09/21              Plan - 08/11/21 1505     Clinical Impression Statement 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 Currently pt is having diffiuclty with communication in and about medication management, appointment tracking, and completion of household tasks. Her performance in some of these areas may decr as she progresses through chemo/rad tx due to temporary physiological changes to the brain. She will benefit from skilled ST targeting these deficits.    Speech Therapy Frequency 2x / week    Duration 12 weeks   pt may schedule x1/week for 6 weeks due to chemo/rad schedule   Treatment/Interventions Cognitive reorganization;Environmental controls;Internal/external aids;Compensatory strategies;Patient/family education;Functional tasks;Cueing hierarchy    Potential to Achieve Goals Good    Consulted and Agree with Plan of Care Patient             Patient will benefit from skilled therapeutic intervention in order to improve the  following deficits and impairments:   Cognitive communication deficit  Dysarthria and anarthria    Problem List Patient Active Problem List   Diagnosis Date Noted   Goals of care, counseling/discussion 07/20/2021   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

## 2021-08-11 NOTE — Progress Notes (Signed)
Pt here for patient teaching.  Pt given Radiation and You booklet, skin care instructions, and Sonafine.  Reviewed areas of pertinence such as fatigue, hair loss, nausea and vomiting, skin changes, headache, and blurry vision . Pt able to give teach back of to pat skin and use unscented/gentle soap,apply Sonafine bid and avoid applying anything to skin within 4 hours of treatment. Pt verbalizes understanding of information given and will contact nursing with any questions or concerns.    Forbes Loll M. Trayven Lumadue RN, BSN  

## 2021-08-11 NOTE — Therapy (Signed)
[facility] [address] [place], [place], [address] Phone: [phone]   Fax:  [phone]  Occupational Therapy Evaluation  Patient Details  Name: Denise Wright MRN: [id number] Date of Birth: Sep 28, 1950 Referring Provider (OT): Muc[place]ones f/u Vaslow   Encounter Date: 08/11/2021   OT End of Session - 08/11/21 1039     Visit Number 1    Number of Visits 9    Date for OT Re-Evaluation 10/06/21    Authorization Type BCBS    Authorization Time Period VL: 30 PT/OT combined, 30 ST, Auth NOT req'd    OT Start Time 0940   arrival time   OT Stop Time 1015    OT Time Calculation (min) 35 min    Activity Tolerance Patient tolerated treatment well;Patient limited by fatigue    Behavior During Therapy [facility] for tasks assessed/performed             Past Medical History:  Diagnosis Date   Hyperlipidemia    Hypertension    Pneumonia    Thyroid disease     Past Surgical History:  Procedure Laterality Date   ABDOMINAL HYSTERECTOMY  8032   APPLICATION OF CRANIAL NAVIGATION Right 07/06/2021   Procedure: APPLICATION OF CRANIAL NAVIGATION;  Surgeon: Karsten Ro, DO;  Location: [facility];  Service: Neurosurgery;  Laterality: Right;   CRANIOTOMY Right 07/06/2021   Procedure: CRANIOTOMY TUMOR EXCISION;  Surgeon: Karsten Ro, DO;  Location: [facility];  Service: Neurosurgery;  Laterality: Right;   TONSILLECTOMY  1965   TOOTH EXTRACTION      There were no vitals filed for this visit.   Subjective Assessment - 08/11/21 1033     Subjective  Pt is a 71 year old female that presents to Neuro OPOT s/p R frontotemporal craniotomy with tumor resection d/t R temporal lobe Glioblastoma diagnosis. Pt presented with AMS 06/30/2021 to ED and subsequently diagnosed with R temporal glioblastoma with imaging revealing large enhaving mass lesion in right temporal lobe, edema and 5 mm midline shift. Pt underwent craniotomy for resection of tumor on  07/06/21. Pt reports "want to be independent again". Pt reports pain in L ankle at 8/10 severity with worsening with weight bearing. Pt denies any nausea and reports consistent fatigue prior and during chemo/radiation. Pt started chemotherapy Sunday evening in pill form and is receiving radiation therapy 5x/a week right now.    Patient is accompanied by: Family member    Pertinent History PMH: HTN, HLD, Pneumonia, Thyroid Disease, Active Cancer    Limitations GBM with chemotherapy/radiation treatment currently    Patient Stated Goals be independent again    Currently in Pain? Yes    Pain Score 8     Pain Location Ankle    Pain Orientation Left    Pain Descriptors / Indicators Aching;Sore    Pain Type Acute pain    Aggravating Factors  weight bearing               [facility] OT Assessment - 08/11/21 0942       Assessment   Medical Diagnosis Glioblastoma GBM    Referring Provider (OT) [place] f/u Vaslow    Onset Date/Surgical Date 07/06/21    Hand Dominance Right    Prior Therapy IP      Precautions   Precautions Fall    Precaution Comments Active cancer - receciving radiation and chemotherapy      Balance Screen   Has the patient fallen in the past 6 months  Yes    How many times? 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, hangout with family and friends      ADL   Eating/Feeding Modified  independent    Grooming Modified independent   daughter is Merchant navy officer hair some   Upper Body Bathing Minimal assistance    Lower Body Bathing Minimal assistance    Upper Body Dressing Increased time    Lower Body Dressing Minimal assistance   needs assistance for compression stockings   Toilet Transfer Modified independent    Toileting - Clothing Manipulation Modified independent    Toileting -  Runner, broadcasting/film/video bars;Shower seat with back      IADL   Shopping Completely unable to shop    Light Housekeeping Needs help with all home maintenance tasks    Meal Prep Does not utilize stove or Education officer, environmental Relies on family or friends for transportation    Medication Management Takes responsibility if medication is prepared in advance in seperate dosage    Physiological scientist financial matters independently (budgets, writes checks, pays rent, bills goes to bank), collects and keeps track of income      Mobility   Mobility Status Comments walking with walker      Written Expression   Dominant Hand Right      Vision - History   Baseline Vision Wears glasses all the time    Additional Comments denies any changes to vision - received new Rx this week.      Cognition   Overall Cognitive Status Impaired/Different from baseline    Area of Impairment Attention;Awareness;Problem solving    Bradyphrenia Yes      Observation/Other Assessments   Focus on Therapeutic Outcomes (FOTO)  N/A      Sensation   Light Touch Appears Intact    Hot/Cold Appears Intact    Additional Comments denies any tingling or numbness      Coordination   9 Hole Peg Test Right;Left    Right 9 Hole Peg Test 37.15s    Left 9 Hole Peg Test 37.64s      ROM / Strength   AROM / PROM / Strength AROM;Strength      AROM   Overall AROM  Within functional limits for tasks performed      Strength    Overall Strength Within functional limits for tasks performed    Overall Strength Comments proximal strength WFL      Hand Function   Right Hand Gross Grasp Functional    Right Hand Grip (lbs) 45.6    Left Hand Gross Grasp Functional    Left Hand Grip (lbs) 31.3  OT Short Term Goals - 08/11/21 1035       OT SHORT TERM GOAL #1   Title Pt will be independent with initial HEP for grip strength in LUE    Time 4    Period Weeks    Status New    Target Date 09/08/21               OT Long Term Goals - 08/11/21 1036       OT LONG TERM GOAL #1   Title Pt will be independent with updated HEP    Time 8    Period Weeks    Status New    Target Date 10/06/21      OT LONG TERM GOAL #2   Title Pt will verbalize understanding of energy conservation strategies    Time 8    Period Weeks    Status New      OT LONG TERM GOAL #3   Title Pt complete all basic ADLs with Mod I and good safety awareness with ecxeption of compression stockings.    Baseline min A    Time 8    Period Weeks    Status New      OT LONG TERM GOAL #4   Title Pt will perform environmental scanning with cog component with 90% accuracy or greater.    Time 8    Period Weeks    Status New      OT LONG TERM GOAL #5   Title Pt will perform simple warm meal prep and/or light housekeeping with good safety awareness and distant supervision    Time 8    Period Weeks    Status New                   Plan - 08/11/21 1029     Clinical Impression Statement Pt is a 71 year old female that presents to Neuro OPOT s/p R frontotemporal craniotomy with tumor resection d/t R temporal lobe Glioblastoma diagnosis. Pt presented with AMS 06/30/2021 to ED and subsequently diagnosed with R temporal glioblastoma with imaging revealing large enhaving mass lesion in right temporal lobe, edema and 5 mm midline shift. Pt underwent craniotomy for resection of tumor on  07/06/21. Pt presents with slight LUE weakness in grip strength and decreased processing and alternating attention. Pt with unsteadiness on feet that impedes overall independence with ADLs and IADLs. Skilled occupational therapy is recommended to target listed areas of deficit and increase independence with ADLs and IADLs and decrease caregiver burden.    OT Occupational Profile and History Problem Focused Assessment - Including review of records relating to presenting problem    Occupational performance deficits (Please refer to evaluation for details): IADL's;ADL's;Leisure    Body Structure / Function / Physical Skills ADL;Decreased knowledge of use of DME;Strength;UE functional use;ROM;IADL    Cognitive Skills Attention;Safety Awareness;Problem Solve;Energy/Drive    Rehab Potential Good    Clinical Decision Making Limited treatment options, no task modification necessary    Comorbidities Affecting Occupational Performance: None    Modification or Assistance to Complete Evaluation  No modification of tasks or assist necessary to complete eval    OT Frequency 1x / week    OT Duration 8 weeks    OT Treatment/Interventions Self-care/ADL training;Functional Mobility Training;Neuromuscular education;Patient/family education;Therapeutic exercise;Cognitive remediation/compensation;Visual/perceptual remediation/compensation;Therapeutic activities;DME and/or AE instruction;Energy conservation    Plan putty HEP, energy conservation strategies, dynamic standing balance    Consulted and Agree with Plan of Care Patient;Family member/caregiver  Family Member Consulted daughter             Patient will benefit from skilled therapeutic intervention in order to improve the following deficits and impairments:   Body Structure / Function / Physical Skills: ADL, Decreased knowledge of use of DME, Strength, UE functional use, ROM, IADL Cognitive Skills: Attention, Safety Awareness, Problem Solve,  Energy/Drive     Visit Diagnosis: Muscle weakness (generalized)  Attention and concentration deficit  Other lack of coordination  Visuospatial deficit  Frontal lobe and executive function deficit  Unsteadiness on feet    Problem List Patient Active Problem List   Diagnosis Date Noted   Goals of care, counseling/discussion 07/20/2021   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

## 2021-08-11 NOTE — Patient Instructions (Signed)
? ?  Have Denise Wright set alarms for med times in order to ask family to get her the medications. ? ?You could have Denise Wright put the appointments she makes today into her phone and/or write them on her calendar. ? ?Assist Denise Wright to recall to cross off the day before she goes to bed in order to help her track her appointments. ?

## 2021-08-14 ENCOUNTER — Ambulatory Visit
Admission: RE | Admit: 2021-08-14 | Discharge: 2021-08-14 | Disposition: A | Discharge status: Home / Self Care | Payer: BC Managed Care – PPO | Source: Ambulatory Visit | Attending: Radiation Oncology | Admitting: Radiation Oncology

## 2021-08-14 ENCOUNTER — Inpatient Hospital Stay: Payer: BC Managed Care – PPO | Admitting: Emergency Medicine

## 2021-08-14 DIAGNOSIS — C719 Malignant neoplasm of brain, unspecified: Secondary | ICD-10-CM | POA: Diagnosis not present

## 2021-08-14 NOTE — Research (Addendum)
[id number] A Single Arm, Pilot Study of Ramipril for Preventing Radiation-Induced Cognitive Decline in Glioblastoma (GBM) Patients Receiving Brain Radiotherapy ? ?WEEK 1 TITRATION ? ?Patient arrives today Accompanied by [name] for the Week 1 Titration visit visit.  ?  ?LABS: ?Mandatory labs are collected per consent and study protocol on 08/10/21.  Confirmed that patient took 4 doses of Ramipril before labs were drawn.  Patient Denise Wright tolerated well without complaint.  Abnormal labs reviewed in AE section below. ?  ?MEDICATION REVIEW: ?Patient reviews and verifies the current medication list is correct.  Patient states that during visit with PCP on 3/1 she was initially prescribed Lasix and told to stop HCTZ, however the provider changed their mind and told her to continue with HCTZ and not start Lasix. Patient did not take any doses of Lasix.  Patient states she started applying Corley per PCP recommendation (OTC balm for muscle/joint pain) to L ankle, then to both ankles once R ankle started hurting as well. ? ?Reported changes were recorded on the medication list and marked as reviewed. All other listed medications remain the same. ? ?VITAL SIGNS: ?Vital signs are collected per study protocol.  BP today 116/54. ?  ?ADVERSE EVENTS:  All graded using CTCAE v. 5.0 ?Patient Denise Wright reports L ankle pain/arthralgia (Grade 1) that started around 08/09/21 and R ankle pain/arthralgia (Grade 1) that started on 08/13/21.  Patient states that she spoke with her PCP about pain during appt on 08/09/21 and was advised to use magne sports balm OTC to use as needed for pain.  Pt denies any injury.  Pt also reports soaking both legs epsom salts in the evening, as well as elevating both legs throughout the day.  Per MD Mauri Reading this is unrelated to study drug, no action or reporting required at this time. ?CMP done on 08/10/21 shows the following: ?Increased creatinine (Grade 1) of 1.04 which remains within  study lab limits, per MD Vaslow unrelated to study drug, no reporting or action required at this time. ?eGFR is decreased at 58 ml/min but does not meet requirements for Chronic kidney disease, per MD Vaslow this value is clinically insignificant at this time and unrelated to study drug. ?Hypocalcemia of 8.4 (Grade 1), per MD Vaslow unrelated to study drug, no reporting or action required at this time. ?Hypoalbuminemia of 3.4 (Grade 1), per MD Vaslow unrelated to study drug, no reporting or action required at this time. ?Decreased total protein is not mentioned in CTCAE, per MD Vaslow value is clinically insignificant at this time and unrelated to study drug. ? ? ?ADVERSE EVENT LOG:  ?Denise Wright ?[id number] ? ?08/14/2021 ? ?Adverse Event Log ? ?Study/Protocol: WF 1801 ?Cycle: Week 1 Titration ? ?Event Grade Onset Date Resolved Date Drug Name Attribution Treatment Comments  ?Arthralgia (L ankle pain) Grade 1 08/09/21 Ongoing Ramipril Unrelated Sports balm, elevation, epsom salts   ?Arthralgia (R ankle pain) Grade 1 08/13/21 Ongoing Ramipril Unrelated Sports balm, elevation, epsom salts   ?Increased creatinine Grade 1 08/10/21 Ongoing Ramipril Unrelated None   ?Hypocalcemia Grade 1 08/10/21 Ongoing Ramipril Unrelated None   ?Hypoalbuminemia Grade 1 08/10/21 Ongoing Ramipril Unrelated None   ? ? ?MEDICATION DIARIES and IRT: ?Patient returned Week 1 medication diaries today.  Per study diaries, 7 doses were taken for a 100% compliance rate. Bottle #1 (Lot [id number]) is returned with 126 remaining tablets. Confirmed pill count with Clinical Research Nurse [facility].  Based on returned medication, 7 doses  were taken with a compliance rate of 100%.  Patient Denise Wright is questioned about any discrepancies. Patient is reminded of the importance of taking study medications as directed, pt verbalizes understanding. Bottle returned to patient along with drug diaries.  Copy of March diary made and original returned to patient,  patient signed and turned in February diary.  Patient meets requirements for titration up of study medication.  Pt instructed to start taking 2 capsules daily starting today (08/14/21), verbalized understanding. ? ?DISPOSITION: Upon completion off all study requirements, patient was escorted to exit with daughter and belongings.  ? ?The patient was thanked for their time and continued voluntary participation in this study. Patient Denise Wright has been provided direct contact information and is encouraged to contact this Nurse for any needs or questions. ? ?Denise Guiles 'Learta Codding' Christoher Drudge, RN, BSN ?Clinical Research Nurse I ?08/15/21 ?4:27 PM ? ? ?

## 2021-08-15 ENCOUNTER — Encounter: Payer: Self-pay | Admitting: Internal Medicine

## 2021-08-15 ENCOUNTER — Other Ambulatory Visit: Payer: Self-pay

## 2021-08-15 ENCOUNTER — Ambulatory Visit
Admission: RE | Admit: 2021-08-15 | Discharge: 2021-08-15 | Disposition: A | Discharge status: Home / Self Care | Payer: BC Managed Care – PPO | Source: Ambulatory Visit | Attending: Radiation Oncology | Admitting: Radiation Oncology

## 2021-08-15 DIAGNOSIS — C719 Malignant neoplasm of brain, unspecified: Secondary | ICD-10-CM | POA: Diagnosis not present

## 2021-08-15 MED ORDER — INV-RAMIPRIL 1.25 MG CAP WF-1801 (#133): 2.5000 mg | ORAL_CAPSULE | Freq: Every day | ORAL | 0 refills | Status: DC

## 2021-08-16 ENCOUNTER — Other Ambulatory Visit: Payer: Self-pay | Admitting: Emergency Medicine

## 2021-08-16 ENCOUNTER — Ambulatory Visit
Admission: RE | Admit: 2021-08-16 | Discharge: 2021-08-16 | Disposition: A | Discharge status: Home / Self Care | Payer: BC Managed Care – PPO | Source: Ambulatory Visit | Attending: Radiation Oncology | Admitting: Radiation Oncology

## 2021-08-16 DIAGNOSIS — C719 Malignant neoplasm of brain, unspecified: Secondary | ICD-10-CM

## 2021-08-17 ENCOUNTER — Ambulatory Visit
Admission: RE | Admit: 2021-08-17 | Discharge: 2021-08-17 | Disposition: A | Discharge status: Home / Self Care | Payer: BC Managed Care – PPO | Source: Ambulatory Visit | Attending: Radiation Oncology | Admitting: Radiation Oncology

## 2021-08-17 ENCOUNTER — Inpatient Hospital Stay (HOSPITAL_BASED_OUTPATIENT_CLINIC_OR_DEPARTMENT_OTHER): Payer: BC Managed Care – PPO | Admitting: Internal Medicine

## 2021-08-17 ENCOUNTER — Inpatient Hospital Stay: Payer: BC Managed Care – PPO

## 2021-08-17 ENCOUNTER — Other Ambulatory Visit: Payer: Self-pay

## 2021-08-17 ENCOUNTER — Encounter: Payer: Self-pay | Admitting: Internal Medicine

## 2021-08-17 VITALS — BP 112/51 | HR 102 | Temp 97.3°F | Resp 18 | Wt 219.7 lb

## 2021-08-17 DIAGNOSIS — C719 Malignant neoplasm of brain, unspecified: Secondary | ICD-10-CM

## 2021-08-17 LAB — CMP (CANCER CENTER ONLY)
ALT: 11 U/L (ref 0–44)
AST: 11 U/L — ABNORMAL LOW (ref 15–41)
Albumin: 3.5 g/dL (ref 3.5–5.0)
Alkaline Phosphatase: 99 U/L (ref 38–126)
Anion gap: 8 (ref 5–15)
BUN: 18 mg/dL (ref 8–23)
CO2: 31 mmol/L (ref 22–32)
Calcium: 8.7 mg/dL — ABNORMAL LOW (ref 8.9–10.3)
Chloride: 103 mmol/L (ref 98–111)
Creatinine: 1.15 mg/dL — ABNORMAL HIGH (ref 0.44–1.00)
GFR, Estimated: 52 mL/min — ABNORMAL LOW (ref >60)
Glucose, Bld: 107 mg/dL — ABNORMAL HIGH (ref 70–99)
Potassium: 3.8 mmol/L (ref 3.5–5.1)
Sodium: 142 mmol/L (ref 135–145)
Total Bilirubin: 0.4 mg/dL (ref 0.3–1.2)
Total Protein: 6.6 g/dL (ref 6.5–8.1)

## 2021-08-17 LAB — CBC WITH DIFFERENTIAL (CANCER CENTER ONLY)
Abs Immature Granulocytes: 0.06 10*3/uL (ref 0.00–0.07)
Basophils Absolute: 0 10*3/uL (ref 0.0–0.1)
Basophils Relative: 0 %
Eosinophils Absolute: 0.1 10*3/uL (ref 0.0–0.5)
Eosinophils Relative: 1 %
HCT: 31.3 % — ABNORMAL LOW (ref 36.0–46.0)
Hemoglobin: 10.3 g/dL — ABNORMAL LOW (ref 12.0–15.0)
Immature Granulocytes: 1 %
Lymphocytes Relative: 13 %
Lymphs Abs: 1.1 10*3/uL (ref 0.7–4.0)
MCH: 31.4 pg (ref 26.0–34.0)
MCHC: 32.9 g/dL (ref 30.0–36.0)
MCV: 95.4 fL (ref 80.0–100.0)
Monocytes Absolute: 0.9 10*3/uL (ref 0.1–1.0)
Monocytes Relative: 10 %
Neutro Abs: 6.7 10*3/uL (ref 1.7–7.7)
Neutrophils Relative %: 75 %
Platelet Count: 276 10*3/uL (ref 150–400)
RBC: 3.28 MIL/uL — ABNORMAL LOW (ref 3.87–5.11)
RDW: 14.3 % (ref 11.5–15.5)
WBC Count: 8.9 10*3/uL (ref 4.0–10.5)
nRBC: 0 % (ref 0.0–0.2)

## 2021-08-17 LAB — RESEARCH LABS

## 2021-08-17 MED ORDER — FUROSEMIDE 20 MG PO TABS: 20.0000 mg | ORAL_TABLET | Freq: Every day | ORAL | 1 refills | Status: DC

## 2021-08-17 NOTE — Progress Notes (Signed)
[facility] [place], [place] [address] [phone]   Interval Evaluation  Date of Service: 08/17/21 Patient Name: Denise Wright Patient MRN: [id number] Patient DOB: 1950/12/10 Provider: Ventura Sellers, MD  Identifying Statement:  Denise Wright is a 71 y.o. female with right temporal glioblastoma   Oncologic History: Oncology History  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[facility])  07/06/2021 Surgery   Right temporal craniotomy, resection with Dr. Reatha Armour; path is glioblastoma IDH-1wt   07/12/2021 Initial Diagnosis   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[facility])   08/07/2021 -  Chemotherapy   Patient is on Treatment Plan : BRAIN GLIOBLASTOMA Radiation Therapy With Concurrent Temozolomide 75 mg/m2 Daily Followed By Sequential Maintenance Temozolomide x 6-12 cycles       Biomarkers:  MGMT Unknown.  IDH 1/2 Wild type.  EGFR Unknown  TERT Unknown   Interval History: Denise Wright presents today for follow up, now having completed 2 weeks of radiation and Temodar.  She reports overall stability of left sided weakness.  No issues tolerating therapy. Continues to do some walking on her own around the home, although typically is relying on a walker.  She is able to button a shirt, tie her shoes, and dresses/toilets without any assistance.  Denies seizures or headaches today.  H+P (07/20/21) Patient presented to medical attention last month with several days, weeks of progressive mental status changes.  Family noticed she was confused and disoriented over normal conversation and activities of daily living.  Baseline she is functionally independent, lives alone, works full time with Museum/gallery exhibitions officer company.  CNS imaging demonstrated an enhancing mass in the right temporal lobe; she underwent craniotomy, resection with Dr. Reatha Armour on 07/06/21, which demonstrated glioblastoma.  Following surgery she  experienced worsening left sided weakness, which prompted rehabilitation admission.  At present, she is doing some walking on her own, albeit slowly.  Left arm and hand have improved, she is starting occupational therapy this week.  Medications: Current Outpatient Medications on File Prior to Visit  Medication Sig Dispense Refill   acetaminophen (TYLENOL) 325 MG tablet Take 2 tablets (650 mg total) by mouth every 6 (six) hours as needed for mild pain (or Fever >/= 101).     ALPRAZolam (XANAX) 0.25 MG tablet Take 1 tablet (0.25 mg total) by mouth 2 (two) times daily as needed for anxiety. 10 tablet 0   b complex vitamins capsule Take 1 capsule by mouth daily.     cholecalciferol (VITAMIN D) 1000 UNITS tablet Take 2,000 Units by mouth daily.     docusate sodium (COLACE) 100 MG capsule Take 1 capsule (100 mg total) by mouth 2 (two) times daily. 10 capsule 0   famotidine (PEPCID) 20 MG tablet Take 1 tablet (20 mg total) by mouth daily. 30 tablet 0   fluticasone (FLONASE) 50 MCG/ACT nasal spray Place 1 spray into both nostrils daily as needed for allergies. OTC     hydrochlorothiazide (HYDRODIURIL) 12.5 MG tablet Take 0.5 tablets (6.25 mg total) by mouth daily. 30 tablet 0   HYDROcodone-acetaminophen (NORCO/VICODIN) 5-325 MG tablet Take 1 tablet by mouth every 4 (four) hours as needed for moderate pain. (Patient not taking: Reported on 08/01/2021) 30 tablet 0   Investigational Ramipril 1.25 MG capsule [id number] Take 2 capsules (2.5 mg total) by mouth at bedtime for 7 days. Take at bedtime with or without food. Drink adequate water to avoid dehydration. Avoid salt substitutes that are high  in potassium.  0   levothyroxine (SYNTHROID) 50 MCG tablet Take 1 tablet (50 mcg total) by mouth daily. 30 tablet 0   Multiple Vitamins-Minerals (MULTIVITAMIN WITH MINERALS) tablet Take 1 tablet by mouth daily.     NON FORMULARY Apply topically as needed. Magne Sport Balm Apply to skin as needed for joint/muscle pain      ondansetron (ZOFRAN) 8 MG tablet Take 1 tablet (8 mg total) by mouth 2 (two) times daily as needed (nausea and vomiting). May take 30-60 minutes prior to Temodar administration if nausea/vomiting occurs. (Patient not taking: Reported on 08/03/2021) 30 tablet 1   temozolomide (TEMODAR) 140 MG capsule Take 1 capsule (140 mg total) by mouth daily. May take on an empty stomach to decrease nausea & vomiting. (Patient not taking: Reported on 08/03/2021) 42 capsule 0   No current facility-administered medications on file prior to visit.    Allergies:  Allergies  Allergen Reactions   Fexofenadine Hives   Penicillins Itching and Other (See Comments)    Pulling sensation in body   Past Medical History:  Past Medical History:  Diagnosis Date   Hyperlipidemia    Hypertension    Pneumonia    Thyroid disease    Past Surgical History:  Past Surgical History:  Procedure Laterality Date   ABDOMINAL HYSTERECTOMY  2355   APPLICATION OF CRANIAL NAVIGATION Right 07/06/2021   Procedure: APPLICATION OF CRANIAL NAVIGATION;  Surgeon: Karsten Ro, DO;  Location: [facility];  Service: Neurosurgery;  Laterality: Right;   CRANIOTOMY Right 07/06/2021   Procedure: CRANIOTOMY TUMOR EXCISION;  Surgeon: Karsten Ro, DO;  Location: [facility];  Service: Neurosurgery;  Laterality: Right;   TONSILLECTOMY  1965   TOOTH EXTRACTION     Social History:  Social History   Socioeconomic History   Marital status: Divorced    Spouse name: Not on file   Number of children: 3   Years of education: 20   Highest education level: Not on file  Occupational History   Occupation: Masters degree in Psychologist, counselling: [employer]   Occupation: Working on Engineer, maintenance   Occupation: Training and development officer of [employer]  Tobacco Use   Smoking status: Never   Smokeless tobacco: Never  Vaping Use   Vaping Use: Never used  Substance and Sexual Activity   Alcohol use: No   Drug use: No    Sexual activity: Yes    Birth control/protection: Surgical  Other Topics Concern   Not on file  Social History Narrative   Divorced, lives in [place] alone.  Independent of ADLs.   Social Determinants of Health   Financial Resource Strain: Not on file  Food Insecurity: Not on file  Transportation Needs: Not on file  Physical Activity: Not on file  Stress: Not on file  Social Connections: Not on file  Intimate Partner Violence: Not on file   Family History:  Family History  Problem Relation Age of Onset   Diabetes Mother    Hypertension Mother    Hypertension Father    Leukemia Father    Stroke Father    Cancer Brother    Pneumonia Brother     Review of Systems: Constitutional: Doesn't report fevers, chills or abnormal weight loss Eyes: Doesn't report blurriness of vision Ears, nose, mouth, throat, and face: Doesn't report sore throat Respiratory: Doesn't report cough, dyspnea or wheezes Cardiovascular: Doesn't report palpitation, chest discomfort  Gastrointestinal:  Doesn't report nausea, constipation, diarrhea  GU: Doesn't report incontinence Skin: Doesn't report skin rashes Neurological: Per HPI Musculoskeletal: Doesn't report joint pain Behavioral/Psych: Doesn't report anxiety  Physical Exam: Wt Readings from Last 3 Encounters:  08/03/21 225 lb 4.8 oz (102.2 kg)  08/01/21 224 lb (101.6 kg)  07/28/21 215 lb 6.4 oz (97.7 kg)   Temp Readings from Last 3 Encounters:  08/03/21 98.1 F (36.7 C) (Temporal)  08/01/21 97.6 F (36.4 C)  07/28/21 (!) 97.2 F (36.2 C)   BP Readings from Last 3 Encounters:  08/11/21 138/80  08/03/21 (!) 119/53  08/01/21 127/75   Pulse Readings from Last 3 Encounters:  08/03/21 82  08/01/21 86  07/28/21 89   KPS: 70 ECOG 1 General: Alert, cooperative, pleasant, in no acute distress Head: Normal EENT: No conjunctival injection or scleral icterus.  Lungs: Resp effort normal Cardiac: Regular rate Abdomen: Non-distended  abdomen Skin: No rashes cyanosis or petechiae. Extremities: 2+ dependent edema  Neurologic Exam: Mental Status: Awake, alert, attentive to examiner. Oriented to self and environment. Language is fluent with intact comprehension.  Cranial Nerves: Visual acuity is grossly normal. Visual fields are full. Extra-ocular movements intact. No ptosis. Face is symmetric Motor: Tone and bulk are normal. Power is 4+/5 in left arm and leg. Reflexes are symmetric, no pathologic reflexes present.  Sensory: Intact to light touch Gait: Hemiparetic   Labs: I have reviewed the data as listed    Component Value Date/Time   NA 141 08/03/2021 1313   K 4.1 08/03/2021 1313   CL 108 08/03/2021 1313   CO2 29 08/03/2021 1313   GLUCOSE 75 08/03/2021 1313   BUN 24 (H) 08/03/2021 1313   CREATININE 0.76 08/03/2021 1313   CALCIUM 8.4 (L) 08/03/2021 1313   PROT 5.6 (L) 08/03/2021 1313   ALBUMIN 3.0 (L) 08/03/2021 1313   AST 16 08/03/2021 1313   ALT 24 08/03/2021 1313   ALKPHOS 93 08/03/2021 1313   BILITOT 0.4 08/03/2021 1313   GFRNONAA >60 08/03/2021 1313   GFRAA 70 (L) 07/17/2011 0311   Lab Results  Component Value Date   WBC 6.1 08/03/2021   NEUTROABS 4.1 08/03/2021   HGB 10.4 (L) 08/03/2021   HCT 32.3 (L) 08/03/2021   MCV 96.1 08/03/2021   PLT 144 (L) 08/03/2021     Assessment/Plan 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[facility])  Denise Wright is clinically stable today, now having completed 2 weeks of IMRT and Temodar.    We ultimately recommended continuing with course of intensity modulated radiation therapy and concurrent daily Temozolomide.  Radiation will be administered Mon-Fri over 6 weeks, Temodar will be dosed at 82m/m2 to be given daily over 42 days.  We reviewed side effects of temodar, including fatigue, nausea/vomiting, constipation, and cytopenias.  Chemotherapy should be held for the following:  ANC less than 1,000  Platelets less than 100,000  LFT or  creatinine greater than 2x ULN  If clinical concerns/contraindications develop  Every 2 weeks during radiation, labs will be checked accompanied by a clinical evaluation in the brain tumor clinic.  For refractory dependent edema, will prescribe lasix 246mdaily.  Will also move forward wtih Testing Ramipril to Prevent Memory Loss in People with Glioblastoma, with help of research team.   All questions were answered. The patient knows to call the clinic with any problems, questions or concerns. No barriers to learning were detected.  The total time spent in the encounter was 30 minutes and more than 50% was on counseling and review of test  results   Ventura Sellers, MD Medical Director of Neuro-Oncology [facility] 08/17/21 3:29 PM

## 2021-08-18 ENCOUNTER — Ambulatory Visit: Payer: BC Managed Care – PPO | Admitting: Occupational Therapy

## 2021-08-18 ENCOUNTER — Encounter: Payer: Self-pay | Admitting: Physical Therapy

## 2021-08-18 ENCOUNTER — Ambulatory Visit: Payer: BC Managed Care – PPO

## 2021-08-18 ENCOUNTER — Encounter: Payer: Self-pay | Admitting: Occupational Therapy

## 2021-08-18 ENCOUNTER — Ambulatory Visit: Payer: BC Managed Care – PPO | Admitting: Physical Therapy

## 2021-08-18 ENCOUNTER — Ambulatory Visit
Admission: RE | Admit: 2021-08-18 | Discharge: 2021-08-18 | Disposition: A | Discharge status: Home / Self Care | Payer: BC Managed Care – PPO | Source: Ambulatory Visit | Attending: Radiation Oncology | Admitting: Radiation Oncology

## 2021-08-18 DIAGNOSIS — M6281 Muscle weakness (generalized): Secondary | ICD-10-CM

## 2021-08-18 DIAGNOSIS — R2689 Other abnormalities of gait and mobility: Secondary | ICD-10-CM

## 2021-08-18 DIAGNOSIS — R41842 Visuospatial deficit: Secondary | ICD-10-CM

## 2021-08-18 DIAGNOSIS — C719 Malignant neoplasm of brain, unspecified: Secondary | ICD-10-CM | POA: Diagnosis not present

## 2021-08-18 DIAGNOSIS — R41844 Frontal lobe and executive function deficit: Secondary | ICD-10-CM

## 2021-08-18 DIAGNOSIS — R2681 Unsteadiness on feet: Secondary | ICD-10-CM

## 2021-08-18 DIAGNOSIS — R278 Other lack of coordination: Secondary | ICD-10-CM

## 2021-08-18 DIAGNOSIS — R41841 Cognitive communication deficit: Secondary | ICD-10-CM

## 2021-08-18 DIAGNOSIS — R41840 Attention and concentration deficit: Secondary | ICD-10-CM

## 2021-08-18 NOTE — Patient Instructions (Signed)
Access Code: [email or web address] ?URL: [email or web address] ?Date: 08/18/2021 ?Prepared by: Elease Etienne ? ?Exercises ?Seated Hip Abduction with Resistance - 1 x daily - 4 x weekly - 2 sets - 12 reps ?Sit to Stand - 1 x daily - 4 x weekly - 2 sets - 10 reps ?Seated Isometric Hip Adduction with Ball - 1 x daily - 4 x weekly - 2 sets - 12 reps - 3 seconds hold ?Standing Marching - 1 x daily - 4 x weekly - 2 sets - 20 reps ?

## 2021-08-18 NOTE — Therapy (Signed)
[facility] ?[place], [place], [address] ?Phone: [phone]   Fax:  [phone] ? ?Occupational Therapy Treatment ? ?Patient Details  ?Name: Denise Wright ?MRN: [id number] ?Date of Birth: 06/17/1950 ?Referring Provider (OT): [facility] f/u [facility] ? ? ?Encounter Date: 08/18/2021 ? ? OT End of Session - 08/18/21 1020   ? ? Visit Number 2   ? Number of Visits 9   ? Date for OT Re-Evaluation 10/06/21   ? Authorization Type BCBS   ? Authorization Time Period VL: 30 PT/OT combined, 30 ST, Auth NOT req'd   ? OT Start Time 1016   ? OT Stop Time 1100   ? OT Time Calculation (min) 44 min   ? Activity Tolerance Patient tolerated treatment well;Patient limited by fatigue   ? Behavior During Therapy [facility] for tasks assessed/performed   ? ?  ?  ? ?  ? ? ?Past Medical History:  ?Diagnosis Date  ? Hyperlipidemia   ? Hypertension   ? Pneumonia   ? Thyroid disease   ? ? ?Past Surgical History:  ?Procedure Laterality Date  ? ABDOMINAL HYSTERECTOMY  1983  ? APPLICATION OF CRANIAL NAVIGATION Right 07/06/2021  ? Procedure: APPLICATION OF CRANIAL NAVIGATION;  Surgeon: Dawley, Theodoro Doing, DO;  Location: [facility];  Service: Neurosurgery;  Laterality: Right;  ? CRANIOTOMY Right 07/06/2021  ? Procedure: CRANIOTOMY TUMOR EXCISION;  Surgeon: Karsten Ro, DO;  Location: [facility];  Service: Neurosurgery;  Laterality: Right;  ? TONSILLECTOMY  1965  ? TOOTH EXTRACTION    ? ? ?There were no vitals filed for this visit. ? ? Subjective Assessment - 08/18/21 1019   ? ? Subjective  "I have pain in my right leg  - between my knee and my ankle"   ? Patient is accompanied by: Family member   ? Pertinent History PMH: HTN, HLD, Pneumonia, Thyroid Disease, Active Cancer   ? Limitations GBM with chemotherapy/radiation treatment currently   ? Patient Stated Goals be independent again   ? Currently in Pain? Yes   ? Pain Score 8    ? Pain Location Leg   ? Pain Orientation Right   ? Pain Descriptors /  Indicators Aching;Sore   ? Pain Type Chronic pain   ? Pain Onset More than a month ago   ? Pain Frequency Constant   ? ?  ?  ? ?  ? ? ? ? ? ? ? ? ? ? ? ? ? ? ? OT Treatments/Exercises (OP) - 08/18/21 1047   ? ?  ? Visual/Perceptual Exercises  ? Copy this Image Pegboard   ? Pegboard with small pegs wth use of LUE for coordination. Pt copied pattern (3 squares red orange and green) with 100% accuracy   ? ?  ?  ? ?  ? ?Resistance Clothespins 1-8# with placing on antenna - pt only placed approximately 8 before needing to stop d/t time constraint. Pt reqd increased time for task.  ? ?Yellow theraputty issued. See pt instructions. ? ? ? ? ? ? ? OT Education - 08/18/21 1108   ? ? Education Details yellow theraputty issued - see pt instructions   ? Person(s) Educated Patient;Child(ren)   ? Methods Demonstration;Explanation   ? Comprehension Verbalized understanding;Returned demonstration   ? ?  ?  ? ?  ? ? ? OT Short Term Goals - 08/11/21 1035   ? ?  ? OT SHORT TERM GOAL #1  ? Title Pt will be independent  with initial HEP for grip strength in LUE   ? Time 4   ? Period Weeks   ? Status New   ? Target Date 09/08/21   ? ?  ?  ? ?  ? ? ? ? OT Long Term Goals - 08/11/21 1036   ? ?  ? OT LONG TERM GOAL #1  ? Title Pt will be independent with updated HEP   ? Time 8   ? Period Weeks   ? Status New   ? Target Date 10/06/21   ?  ? OT LONG TERM GOAL #2  ? Title Pt will verbalize understanding of energy conservation strategies   ? Time 8   ? Period Weeks   ? Status New   ?  ? OT LONG TERM GOAL #3  ? Title Pt complete all basic ADLs with Mod I and good safety awareness with ecxeption of compression stockings.   ? Baseline min A   ? Time 8   ? Period Weeks   ? Status New   ?  ? OT LONG TERM GOAL #4  ? Title Pt will perform environmental scanning with cog component with 90% accuracy or greater.   ? Time 8   ? Period Weeks   ? Status New   ?  ? OT LONG TERM GOAL #5  ? Title Pt will perform simple warm meal prep and/or light housekeeping  with good safety awareness and distant supervision   ? Time 8   ? Period Weeks   ? Status New   ? ?  ?  ? ?  ? ? ? ? ? ? ? ? Plan - 08/18/21 1109   ? ? Clinical Impression Statement Pt has verbalized understanding of goals - pt's daughter present and also verbalized understanding.   ? OT Occupational Profile and History Problem Focused Assessment - Including review of records relating to presenting problem   ? Occupational performance deficits (Please refer to evaluation for details): IADL's;ADL's;Leisure   ? Body Structure / Function / Physical Skills ADL;Decreased knowledge of use of DME;Strength;UE functional use;ROM;IADL   ? Cognitive Skills Attention;Safety Awareness;Problem Solve;Energy/Drive   ? Rehab Potential Good   ? Clinical Decision Making Limited treatment options, no task modification necessary   ? Comorbidities Affecting Occupational Performance: None   ? Modification or Assistance to Complete Evaluation  No modification of tasks or assist necessary to complete eval   ? OT Frequency 1x / week   ? OT Duration 8 weeks   ? OT Treatment/Interventions Self-care/ADL training;Functional Mobility Training;Neuromuscular education;Patient/family education;Therapeutic exercise;Cognitive remediation/compensation;Visual/perceptual remediation/compensation;Therapeutic activities;DME and/or AE instruction;Energy conservation   ? Plan vis/cog task, energy conservation strategies, dynamic standing balance   ? Consulted and Agree with Plan of Care Patient;Family member/caregiver   ? Family Member Consulted daughter   ? ?  ?  ? ?  ? ? ?Patient will benefit from skilled therapeutic intervention in order to improve the following deficits and impairments:   ?Body Structure / Function / Physical Skills: ADL, Decreased knowledge of use of DME, Strength, UE functional use, ROM, IADL ?Cognitive Skills: Attention, Safety Awareness, Problem Solve, Energy/Drive ?  ? ? ?Visit Diagnosis: ?Visuospatial deficit ? ?Frontal lobe and  executive function deficit ? ?Other abnormalities of gait and mobility ? ?Muscle weakness (generalized) ? ?Unsteadiness on feet ? ?Attention and concentration deficit ? ?Other lack of coordination ? ? ? ?Problem List ?Patient Active Problem List  ? Diagnosis Date Noted  ? Goals of care, counseling/discussion 07/20/2021  ?  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[facility]) 07/12/2021  ? Brain mass 06/30/2021  ? Hypothyroidism 06/30/2021  ? Hypokalemia 06/30/2021  ? Essential hypertension 05/25/2021  ? Hyperglycemia 07/14/2011  ? Normocytic anemia 07/13/2011  ? ? ?Zachery Conch, OT ?08/18/2021, 11:10 AM ? ?[facility] ?[address] ?[place], [place], [address] ?Phone: [phone]   Fax:  [phone] ? ?Name: Denise Wright ?MRN: [id number] ?Date of Birth: 08-Sep-1950 ? ?

## 2021-08-18 NOTE — Patient Instructions (Signed)
1. Grip Strengthening (Resistive Putty)   Squeeze putty using thumb and all fingers. Repeat _20___ times. Do __2__ sessions per day.   2. Roll putty into tube on table and pinch between each finger and thumb x 10 reps each. (can do ring and small finger together)     Copyright  VHI. All rights reserved.   

## 2021-08-18 NOTE — Therapy (Signed)
[facility] ?[place] ?[place], [place], [address] ?Phone: [phone]   Fax:  [phone] ? ?Speech Language Pathology Treatment ? ?Patient Details  ?Name: Denise Wright ?MRN: [id number] ?Date of Birth: 17-Feb-1951 ?Referring Provider (SLP): Lauraine Rinne, PA-C ? ? ?Encounter Date: 08/18/2021 ? ? End of Session - 08/18/21 1037   ? ? Visit Number 2   ? Number of Visits 25   ? Date for SLP Re-Evaluation 11/09/21   ? Authorization Type BCBS   ? SLP Start Time 1100   ? SLP Stop Time  1150   ? SLP Time Calculation (min) 50 min   ? Activity Tolerance Patient tolerated treatment well   ? ?  ?  ? ?  ? ? ?Past Medical History:  ?Diagnosis Date  ? Hyperlipidemia   ? Hypertension   ? Pneumonia   ? Thyroid disease   ? ? ?Past Surgical History:  ?Procedure Laterality Date  ? ABDOMINAL HYSTERECTOMY  1983  ? APPLICATION OF CRANIAL NAVIGATION Right 07/06/2021  ? Procedure: APPLICATION OF CRANIAL NAVIGATION;  Surgeon: Dawley, Theodoro Doing, DO;  Location: [facility];  Service: Neurosurgery;  Laterality: Right;  ? CRANIOTOMY Right 07/06/2021  ? Procedure: CRANIOTOMY TUMOR EXCISION;  Surgeon: Karsten Ro, DO;  Location: [facility];  Service: Neurosurgery;  Laterality: Right;  ? TONSILLECTOMY  1965  ? TOOTH EXTRACTION    ? ? ?There were no vitals filed for this visit. ? ? Subjective Assessment - 08/18/21 1058   ? ? Subjective "a little more tired"   ? Patient is accompained by: Family member   Denise Wright, daughter  ? Currently in Pain? Yes   ? Pain Score 4    ? Pain Location Leg   ? ?  ?  ? ?  ? ? ? ? ? ? ? ? ADULT SLP TREATMENT - 08/18/21 1037   ? ?  ? General Information  ? Behavior/Cognition Alert;Cooperative;Pleasant mood   ?  ? Treatment Provided  ? Treatment provided Cognitive-Linquistic   ?  ? Cognitive-Linquistic Treatment  ? Treatment focused on Cognition;Patient/family/caregiver education   ? Skilled Treatment Pt reported good tolerance with rad/chemo and no overt cognitive  linguistic challenges since last session. Pt's daughter indicated some confusion re: managing appointments requiring visual feedback (via MyChart) to improve patient understanding and comprehension. Some reduced comprehension exhibited in conversation (could be in part attention) requiring SLP rephrasing and repetition. SLP completed CLQT today, which revealed mild cognitive linguistic deficits (moderate-memory and visuospatial skills; mild- attention, executive functioning). Pt exhibited adequate recall but exhibited delayed processing, reduced problem solving, and reduced attention/awareness. SLP reviewed subtests with errors (symbol trails, design memory), in which pt appeared to possibly explain away deficits (ex: debating sizes of items). Some atypical behaviors noted including switching to non-dominant hand during design generation task to "see if I could do it" given some recent left sided weakness.   ?  ? Assessment / Recommendations / Plan  ? Plan Continue with current plan of care   ?  ? Progression Toward Goals  ? Progression toward goals Progressing toward goals   ? ?  ?  ? ?  ? ? ? SLP Education - 08/18/21 1226   ? ? Education Details CLQT results and analysis, recommendations for home   ? Person(s) Educated Patient;Child(ren)   ? Methods Explanation;Demonstration;Verbal cues   ? Comprehension Verbalized understanding;Returned demonstration;Verbal cues required;Need further instruction   ? ?  ?  ? ?  ? ? ?  SLP Short Term Goals - 08/18/21 1038   ? ?  ? SLP SHORT TERM GOAL #1  ? Title pt will complete standardized testing   ? Time 2   ? Period Weeks   ? Status Achieved   ? Target Date 08/25/21   ?  ? SLP SHORT TERM GOAL #2  ? Title Pt will verbalize 3 attention/memory compensations for making/tracking appointments, managing meds, and other daily tasks in 2 sessions   ? Time 4   ? Period Weeks   ? Status On-going   ? Target Date 09/08/21   ?  ? SLP SHORT TERM GOAL #3  ? Title pt will verbally generate and  carryover (with min A) solutions to problems encountered in everyday living in 2 sessions   ? Time 6   ? Period Weeks   ? Status On-going   ? Target Date 09/22/21   ?  ? SLP SHORT TERM GOAL #4  ? Title pt will verbally state errors on mod complex written or verbal tasks with nonverbal cues   ? Time 6   ? Period Weeks   ? Status On-going   ? Target Date 09/22/21   ? ?  ?  ? ?  ? ? ? SLP Long Term Goals - 08/18/21 1038   ? ?  ? SLP LONG TERM GOAL #1  ? Title Pt will verbalize AND IMPLEMEMENT 3 attention/memory compensations for making/tracking appointments, managing meds, and other daily tasks in 4 sessions   ? Time 8   ? Period Weeks   ? Status On-going   ? Target Date 10/06/21   ?  ? SLP LONG TERM GOAL #2  ? Title Pt will score higher on a patient-reported outcome measure in the last 1-2 visits than when taken in the first 2 sessions   ? Time 12   ? Period Weeks   ? Status On-going   ? Target Date 11/09/21   ?  ? SLP LONG TERM GOAL #3  ? Title pt will verbally generate and independently carryover solutions to problems encountered in everyday living with min A in 4 sessions   ? Time 12   ? Period Weeks   ? Status On-going   ? Target Date 11/09/21   ?  ? SLP LONG TERM GOAL #4  ? Title pt will note verbal or written errors in work-like tasks independently for improving verbal and/or written accuracy   ? Time 12   ? Period Weeks   ? Status On-going   ? Target Date 11/09/21   ? ?  ?  ? ?  ? ? ? Plan - 08/18/21 1038   ? ? 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 CLQT completed today, which revealed overall mild deficits (mild attention/executive functioning & moderate memory/visuospatial skills). Reduced awareness of cognitive lingusitic deficits exhibited with daughter often indicating functional impact at home (ex: confusion re: appointment management). Currently pt is having  difficulty with communication in and about medication management, appointment tracking, and completion of household tasks. Her performance in some of these areas may decr as she progresses through chemo/rad tx due to temporary physiological changes to the brain. She will benefit from skilled ST targeting these deficits.   ? Speech Therapy Frequency 2x / week   ? Duration 12 weeks   pt may schedule x1/week for 6 weeks due to chemo/rad schedule  ? Treatment/Interventions Cognitive  reorganization;Environmental controls;Internal/external aids;Compensatory strategies;Patient/family education;Functional tasks;Cueing hierarchy   ? Potential to Achieve Goals Good   ? Consulted and Agree with Plan of Care Patient   ? ?  ?  ? ?  ? ? ?Patient will benefit from skilled therapeutic intervention in order to improve the following deficits and impairments:   ?Cognitive communication deficit ? ? ? ?Problem List ?Patient Active Problem List  ? Diagnosis Date Noted  ? Goals of care, counseling/discussion 07/20/2021  ? 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[facility]) 07/12/2021  ? Brain mass 06/30/2021  ? Hypothyroidism 06/30/2021  ? Hypokalemia 06/30/2021  ? Essential hypertension 05/25/2021  ? Hyperglycemia 07/14/2011  ? Normocytic anemia 07/13/2011  ? ? ?Marzetta Board, CCC-SLP ?08/18/2021, 12:33 PM ? ?[facility] ?[place] ?[place], [place], [address] ?Phone: [phone]   Fax:  [phone] ? ? ?Name: Reesha Debes Krienke ?MRN: [id number] ?Date of Birth: March 03, 1951 ? ?

## 2021-08-18 NOTE — Therapy (Signed)
OUTPATIENT PHYSICAL THERAPY TREATMENT NOTE   Patient Name: CHARDAE MULKERN MRN: [id number] DOB:02-21-1951, 71 y.o., female Today's Date: 08/18/2021  PCP: Willey Blade, MD REFERRING PROVIDER: Willey Blade, MD   PT End of Session - 08/18/21 1347     Visit Number 2    Number of Visits 15    Date for PT Re-Evaluation 10/20/21    Progress Note Due on Visit 10    PT Start Time 1232    PT Stop Time 1315    PT Time Calculation (min) 43 min    Activity Tolerance Patient tolerated treatment well             Past Medical History:  Diagnosis Date   Hyperlipidemia    Hypertension    Pneumonia    Thyroid disease    Past Surgical History:  Procedure Laterality Date   ABDOMINAL HYSTERECTOMY  8250   APPLICATION OF CRANIAL NAVIGATION Right 07/06/2021   Procedure: APPLICATION OF CRANIAL NAVIGATION;  Surgeon: Karsten Ro, DO;  Location: [facility];  Service: Neurosurgery;  Laterality: Right;   CRANIOTOMY Right 07/06/2021   Procedure: CRANIOTOMY TUMOR EXCISION;  Surgeon: Karsten Ro, DO;  Location: [facility];  Service: Neurosurgery;  Laterality: Right;   TONSILLECTOMY  1965   TOOTH EXTRACTION     Patient Active Problem List   Diagnosis Date Noted   Goals of care, counseling/discussion 07/20/2021   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[facility]) 07/12/2021   Brain mass 06/30/2021   Hypothyroidism 06/30/2021   Hypokalemia 06/30/2021   Essential hypertension 05/25/2021   Hyperglycemia 07/14/2011   Normocytic anemia 07/13/2011    REFERRING DIAG: D49.6 (ICD-10-CM) - Brain tumor ([facility] [facility])   THERAPY DIAG:  Other abnormalities of gait and mobility  Muscle weakness (generalized)  PERTINENT HISTORY: HTN, hypothyroidism, hyperglycemia, normocytic anemia, glioblastoma with isocitrate dehydrogenase gene wildtype  PRECAUTIONS: Fall  SUBJECTIVE: Pt states her MD just put her on Lasix PRN for mild edema in her R>L LE.  She states they don't suspect blood clots.  PAIN:   Are you having pain? Yes: NPRS scale: 5/10 Pain location: RLE Pain description: Aching  Today's Treatment (08/18/2021): 2MWT w/ RW: 219' 2MWT w/o AD: 215' BERG:  43/56 HEP initiated: see below  STS w/ overhead press 3lbs x5, alternating arm for press up  HEP: Access Code: [email or web address] URL: [email or web address] Date: 08/18/2021 Prepared by: Elease Etienne  Exercises Seated Hip Abduction with Resistance - 1 x daily - 4 x weekly - 2 sets - 12 reps Sit to Stand - 1 x daily - 4 x weekly - 2 sets - 10 reps Seated Isometric Hip Adduction with Ball - 1 x daily - 4 x weekly - 2 sets - 12 reps - 3 seconds hold Standing Marching - 1 x daily - 4 x weekly - 2 sets - 20 reps  OBJECTIVE:    DIAGNOSTIC FINDINGS: Pt had MRI prior to surgical resection of tumor   COGNITION: Overall cognitive status: Within functional limits for tasks assessed               SENSATION: Light touch: Appears intact Hot/Cold: Appears intact-pt reports no difficulty noting difference in shower and when washing hands   COORDINATION: Fingertip opposition: normal bilaterally Heel-to-shin:  normal bilaterally; inc time   EDEMA:  Mild bilateral LEs-pt states she has had this for a while now.   MUSCLE TONE: None noted in LE   POSTURE: rounded shoulders, forward head, and increased thoracic kyphosis   AROM/PROM:  AROM Right 08/11/2021 Left 08/11/2021  Hip flexion [facility]  Hip extension " "  Hip abduction " "  Hip adduction " "  Hip internal rotation " "  Hip external rotation " "  Knee flexion " "  Knee extension " "  Ankle dorsiflexion " "  Ankle plantarflexion " "   (Blank rows = not tested) MMT: Left shoulder 4-/5 MMT Right 08/11/2021 Left 08/11/2021  Hip flexion 4/5 3/5  Hip extension      Hip abduction 4/5 4/5  Hip adduction 5/5 5/5  Hip internal rotation      Hip external rotation      Knee flexion 4-/5 3+/5  Knee extension 4+/5 4+/5  Ankle dorsiflexion 5/5 4+/5; no pain   Ankle plantarflexion      Ankle inversion      Ankle eversion      (Blank rows = not tested)   TRANSFERS: Assistive device utilized: Environmental consultant - 2 wheeled  Sit to stand: Modified independence Stand to sit: Modified independence   STAIRS: 6", 4 step up using bilateral rails, step-to pattern, up with LLE, down with RLE.  Edu on safe stair negotiation, "up with the good, down with the bad" to prevent falls on steps into home.     GAIT: Gait pattern: step to pattern, decreased stride length, and trunk flexed Distance walked: Several bouts in and around gym for balance and fall risk assessments. Assistive device utilized: Environmental consultant - 2 wheeled Level of assistance: SBA   FUNCTIONAL TESTs:  5 times sit to stand: 15.91 sec Timed up and go (TUG): 19.53 sec w/ RW 10 meter walk test: (normal)=0.56 m/sec or 1.52f/sec; (Fast)=.77 m/sec or 2.56 ft/sec  w/ RW     PATIENT EDUCATION: Education details: Edu on initial HEP and use of LRAD vs RW in home with daughter's supervision for safety. Person educated: Patient Education method: Explanation Education comprehension: verbalized understanding         ASSESSMENT:   CLINICAL IMPRESSION: Pt tolerates treatment well with initiation of HEP for strength and balance.  Continued assessment measures for balance today with pt's BERG score indicating moderate fall risk and 2MWT indicating some decreased endurance with functional mobility both with and without the use of her RW.  She is making progress towards goals.        OBJECTIVE IMPAIRMENTS Abnormal gait, decreased activity tolerance, decreased balance, decreased endurance, decreased mobility, difficulty walking, decreased strength, and increased edema.    ACTIVITY LIMITATIONS cleaning, community activity, driving, meal prep, occupation, laundry, yard work, and shopping.    PERSONAL FACTORS HTN, hypothyroidism, hyperglycemia are also affecting patient's functional outcome.      REHAB POTENTIAL:  Good   CLINICAL DECISION MAKING: Evolving/moderate complexity   EVALUATION COMPLEXITY: Moderate     GOALS: Goals reviewed with patient? Yes   SHORT TERM GOALS:   Pt will be independent with strength and balance HEP with supervision from family. Baseline: to be initiated at second visit Target date: 09/15/2021 Goal status: INITIAL   2.  Pt will decrease 5xSTS to <13 seconds in order to demonstrate decreased risk for falls and improved functional bilateral LE strength and power. Baseline: 15.91 sec Target date: 09/15/2021 Goal status: INITIAL   3.  Pt will demonstrate TUG of <16 seconds in order to decrease risk of falls and improve functional mobility using LRAD. Baseline: 19.53 seconds Target date: 09/15/2021 Goal status: INITIAL   4.  Pt will demonstrate a gait speed of >2 feet/sec at normal  pace w/ LRAD in order to decrease risk for falls.  Baseline: 1.86 ft/sec w/ RW Target date: 09/15/2021 Goal status: INITIAL   LONG TERM GOALS:   Pt will increase BERG balance score to 46/56 to demonstrate improved static balance. Baseline: 43/56 Target date: 10/20/2021 Goal status: INITIAL   2.  Pt will ambulate>300' feet on 2MWT to demonstrate improved endurance for functional tasks in home and community. Baseline: 2MWT w/ RW: 219' & 2MWT w/o AD: 215' Target date: 10/20/2021 Goal status: INITIAL   3.  Pt will ambulate up eight 6" stairs using unilateral rails w/ step-through pattern. Baseline: Pt ambulates up 4 stairs using bilateral rails w/ step-to pattern. Target date: 10/20/2021 Goal status: INITIAL   4.  Pt will ambulate 500 feet with LRAD and SBA level of assist to promote household and community access.  Baseline: household ambulator Target date: 10/20/2021 Goal status: INITIAL   5.  Pt will report that she has returned to community exercise program.  Baseline: does not currently attend Target date: 10/20/2021 Goal status: INITIAL   PLAN: PT FREQUENCY:  1x/wk for 6 weeks  then 2x/wk for 4 weeks   PT DURATION: 10 weeks   PLANNED INTERVENTIONS: Therapeutic exercises, Therapeutic activity, Neuromuscular re-education, Balance training, Gait training, Patient/Family education, Stair training, Vestibular training, and DME instructions   PLAN FOR NEXT SESSION:  NuStep for endurance, trial SPC vs no AD for balance and gait training, incorporate SLS and tandem activities, stair negotiation w/ unilateral rail   Bary Richard, PT, DPT 08/18/2021, 1:51 PM

## 2021-08-21 ENCOUNTER — Other Ambulatory Visit: Payer: Self-pay | Admitting: Emergency Medicine

## 2021-08-21 ENCOUNTER — Inpatient Hospital Stay: Payer: BC Managed Care – PPO | Admitting: Emergency Medicine

## 2021-08-21 ENCOUNTER — Ambulatory Visit
Admission: RE | Admit: 2021-08-21 | Discharge: 2021-08-21 | Disposition: A | Discharge status: Home / Self Care | Payer: BC Managed Care – PPO | Source: Ambulatory Visit | Attending: Radiation Oncology | Admitting: Radiation Oncology

## 2021-08-21 ENCOUNTER — Other Ambulatory Visit: Payer: Self-pay

## 2021-08-21 DIAGNOSIS — C719 Malignant neoplasm of brain, unspecified: Secondary | ICD-10-CM

## 2021-08-21 NOTE — Research (Unsigned)
[id number] A Single Arm, Pilot Study of Ramipril for Preventing Radiation-Induced Cognitive Decline in Glioblastoma (GBM) Patients Receiving Brain Radiotherapy   WEEK 2 TITRATION   Patient arrives today (08/21/21) Accompanied by daughter [name] for the Week 2 (Week 3 Treatment Schedule) Titration visit visit.    LABS: Mandatory labs are collected per consent and study protocol on 08/17/21.  Confirmed that patient took 4 doses of Ramipril before labs were drawn.  Patient Keia Rask Fluckiger tolerated well without complaint.  Abnormal labs reviewed in AE section below.   MEDICATION REVIEW: Patient reviews and verifies the current medication list is correct.  Patient was prescribed Lasix during MD Vaslow visit on 08/17/21 to take as needed for bilat leg swelling.  Reported changes were recorded on the medication list and marked as reviewed. All other listed medications remain the same.   VITAL SIGNS: Vital signs are collected per study protocol.  BP today 120/67.   ADVERSE EVENTS:  All graded using CTCAE v. 5.0 Patient Bettyjane Shenoy Stotts reports bilateral ankle pain/arthralgia (Grade 1) that started around 08/09/21 and R ankle pain/arthralgia (Grade 1) that started on 08/13/21.  Patient states that she spoke with her PCP about pain during appt on 08/09/21 and was advised to use magne sports balm OTC to use as needed for pain.  Pt denies any injury.  Pt also reports soaking both legs epsom salts in the evening, as well as elevating both legs throughout the day.  Per MD Mauri Reading this is unrelated to study drug, no action or reporting required at this time. CMP done on 08/10/21 shows the following: Increased creatinine (Grade 1) of 1.04 which remains within study lab limits, per MD Vaslow unrelated to study drug, no reporting or action required at this time. eGFR is decreased at 58 ml/min but does not meet requirements for Chronic kidney disease, per MD Vaslow this value is clinically insignificant at this time and unrelated to  study drug. Hypocalcemia of 8.4 (Grade 1), per MD Vaslow unrelated to study drug, no reporting or action required at this time. Hypoalbuminemia of 3.4 (Grade 1), per MD Vaslow unrelated to study drug, no reporting or action required at this time. Decreased total protein is not mentioned in CTCAE, per MD Vaslow value is clinically insignificant at this time and unrelated to study drug.     ADVERSE EVENT LOGShantoria Ellwood Capizzi [phone]   08/21/2021   Adverse Event Log   Study/Protocol: WF 1801 Cycle: Week 2 Titration   Event Grade Onset Date Resolved Date Drug Name Attribution Treatment Comments  Arthralgia (L ankle pain) Grade 1 08/09/21 Ongoing Ramipril Unrelated Sports balm, elevation, epsom salts    Arthralgia (R ankle pain) Grade 1 08/13/21 Ongoing Ramipril Unrelated Sports balm, elevation, epsom salts    Increased creatinine Grade 1 08/10/21 Ongoing Ramipril Unrelated None    Hypocalcemia Grade 1 08/10/21 Ongoing Ramipril Unrelated None    Hypoalbuminemia Grade 1 08/10/21 Ongoing Ramipril Unrelated None        MEDICATION DIARIES and IRT: Patient returned Week 1 medication diaries today.  Per study diaries, 7 doses were taken for a 100% compliance rate. Bottle #1 (Lot [id number]) is returned with 126 remaining tablets. Confirmed pill count with Clinical Research Nurse [facility].  Based on returned medication, 7 doses were taken with a compliance rate of 100%.  Patient Lakesia Dahle Bonneau is questioned about any discrepancies. Patient is reminded of the importance of taking study medications as directed, pt verbalizes understanding. Bottle returned to  patient along with drug diaries.  Copy of March diary made and original returned to patient, patient signed and turned in February diary.  Patient meets requirements for titration up of study medication.  Pt instructed to start taking 2 capsules daily starting today (08/14/21), verbalized understanding.   DISPOSITION: Upon completion off all study  requirements, patient was escorted to exit with daughter and belongings.    The patient was thanked for their time and continued voluntary participation in this study. Patient Onell Mcmath Fricker has been provided direct contact information and is encouraged to contact this Nurse for any needs or questions.

## 2021-08-22 ENCOUNTER — Other Ambulatory Visit (HOSPITAL_COMMUNITY): Payer: Self-pay

## 2021-08-22 ENCOUNTER — Encounter: Payer: Self-pay | Admitting: Internal Medicine

## 2021-08-22 ENCOUNTER — Ambulatory Visit
Admission: RE | Admit: 2021-08-22 | Discharge: 2021-08-22 | Disposition: A | Discharge status: Home / Self Care | Payer: BC Managed Care – PPO | Source: Ambulatory Visit | Attending: Radiation Oncology | Admitting: Radiation Oncology

## 2021-08-22 DIAGNOSIS — C719 Malignant neoplasm of brain, unspecified: Secondary | ICD-10-CM | POA: Diagnosis not present

## 2021-08-22 MED ORDER — INV-RAMIPRIL 1.25 MG CAP WF-1801 (#133): 5.0000 mg | ORAL_CAPSULE | Freq: Every day | ORAL | 0 refills | Status: DC

## 2021-08-23 ENCOUNTER — Encounter: Payer: Self-pay | Admitting: Physical Therapy

## 2021-08-23 ENCOUNTER — Ambulatory Visit
Admission: RE | Admit: 2021-08-23 | Discharge: 2021-08-23 | Disposition: A | Discharge status: Home / Self Care | Payer: BC Managed Care – PPO | Source: Ambulatory Visit | Attending: Radiation Oncology | Admitting: Radiation Oncology

## 2021-08-23 ENCOUNTER — Other Ambulatory Visit: Payer: Self-pay

## 2021-08-23 ENCOUNTER — Ambulatory Visit: Payer: BC Managed Care – PPO

## 2021-08-23 ENCOUNTER — Ambulatory Visit: Payer: BC Managed Care – PPO | Admitting: Physical Therapy

## 2021-08-23 ENCOUNTER — Ambulatory Visit: Payer: BC Managed Care – PPO | Admitting: Occupational Therapy

## 2021-08-23 ENCOUNTER — Encounter: Payer: Self-pay | Admitting: Occupational Therapy

## 2021-08-23 ENCOUNTER — Other Ambulatory Visit (HOSPITAL_COMMUNITY): Payer: Self-pay

## 2021-08-23 DIAGNOSIS — M6281 Muscle weakness (generalized): Secondary | ICD-10-CM

## 2021-08-23 DIAGNOSIS — R2689 Other abnormalities of gait and mobility: Secondary | ICD-10-CM

## 2021-08-23 DIAGNOSIS — R41842 Visuospatial deficit: Secondary | ICD-10-CM

## 2021-08-23 DIAGNOSIS — R2681 Unsteadiness on feet: Secondary | ICD-10-CM

## 2021-08-23 DIAGNOSIS — R41841 Cognitive communication deficit: Secondary | ICD-10-CM

## 2021-08-23 DIAGNOSIS — C719 Malignant neoplasm of brain, unspecified: Secondary | ICD-10-CM | POA: Diagnosis not present

## 2021-08-23 DIAGNOSIS — R41840 Attention and concentration deficit: Secondary | ICD-10-CM

## 2021-08-23 DIAGNOSIS — R41844 Frontal lobe and executive function deficit: Secondary | ICD-10-CM

## 2021-08-23 DIAGNOSIS — R278 Other lack of coordination: Secondary | ICD-10-CM

## 2021-08-23 NOTE — Patient Instructions (Signed)
Energy Conservation Techniques  Sit for as many activities as possible. Use slow, smooth movements.  Rushing increases discomfort. Determine the necessity of performing the task.  Simplify those tasks that are necessary.  (Get clothes out of the dryer when they are warm instead of ironing, let dishes air dry, etc.) Take frequent rests both during and between activities.  Avoid repetitive tasks. Pre-plan your activities; try a daily and/or weekly schedule.  Spread out the activities that are most fatiguing (break up cleaning tasks over multiple days). Remember to plan a balance of work, rest and recreation. Consider the best time for each activity.  Do the most exertive task when you have the most energy. Don't carry items if you can push them.  Slide, don't lift. Push, don't pull. Utilize two hands when appropriate. Maintain good posture and use proper body mechanics. Avoid remaining in one position for too long. When lifting, bend at the knees, not at the waist.  Exhale when bending down, inhale when straightening up. Carry objects as close to your body and as near to the center of the pelvis.  11. Avoid wasted body movements (position yourself for the task so that you avoid bending, twisting, etc. when possible). 12. Select the best working environment.  Consider lighting, ventilation, clothing, and equipment. 13. Organize your storage areas, making the items you use daily convenient.  Store heaviest items at waist            height.  Store frequently used items between shoulders and knee height.  Consider leaving frequently used       items on countertops.  (You can organize in storage baskets based on time used/purpose). 14. Feelings and emotions can be real causes of fatigue.  Try to avoid unnecessary worry, irritation, or  frustration.  Avoid stress, it can also be a source of fatigue. 15. Get help from other people for difficult tasks. 16. Explore equipment or items that may be able to do  the job for you with greater ease.  (Electric can  openers, blenders, lightweight items for cleaning, etc.) 

## 2021-08-23 NOTE — Therapy (Signed)
[facility] ?[place] ?[place], [place], [address] ?Phone: [phone]   Fax:  [phone] ? ?Speech Language Pathology Treatment ? ?Patient Details  ?Name: Denise Wright ?MRN: [id number] ?Date of Birth: Dec 20, 1950 ?Referring Provider (SLP): Lauraine Rinne, PA-C ? ? ?Encounter Date: 08/23/2021 ? ? End of Session - 08/23/21 1225   ? ? Visit Number 3   ? Date for SLP Re-Evaluation 11/09/21   ? Authorization Type BCBS   ? SLP Start Time 1146   ? SLP Stop Time  1230   ? SLP Time Calculation (min) 44 min   ? Activity Tolerance Patient tolerated treatment well   ? ?  ?  ? ?  ? ? ?Past Medical History:  ?Diagnosis Date  ? Hyperlipidemia   ? Hypertension   ? Pneumonia   ? Thyroid disease   ? ? ?Past Surgical History:  ?Procedure Laterality Date  ? ABDOMINAL HYSTERECTOMY  1983  ? APPLICATION OF CRANIAL NAVIGATION Right 07/06/2021  ? Procedure: APPLICATION OF CRANIAL NAVIGATION;  Surgeon: Dawley, Theodoro Doing, DO;  Location: [facility];  Service: Neurosurgery;  Laterality: Right;  ? CRANIOTOMY Right 07/06/2021  ? Procedure: CRANIOTOMY TUMOR EXCISION;  Surgeon: Karsten Ro, DO;  Location: [facility];  Service: Neurosurgery;  Laterality: Right;  ? TONSILLECTOMY  1965  ? TOOTH EXTRACTION    ? ? ?There were no vitals filed for this visit. ? ? Subjective Assessment - 08/23/21 1147   ? ? Subjective "it was interesting"   ? Patient is accompained by: Family member   [name], daughter  ? Currently in Pain? Yes   ? Pain Score 3    ? Pain Location Leg   ? ?  ?  ? ?  ? ? ? ? ? ? ? ? ADULT SLP TREATMENT - 08/23/21 0001   ? ?  ? General Information  ? Behavior/Cognition Alert;Cooperative;Pleasant mood   ?  ? Treatment Provided  ? Treatment provided Cognitive-Linquistic   ?  ? Cognitive-Linquistic Treatment  ? Treatment focused on Cognition;Patient/family/caregiver education   ? Skilled Treatment Pt recalled activities from OT/PT with report of needing increased processing time compared to  baseline. Pt reported completed 3 tax returns without suspected error with use of double check and extra time. Pt continues to use MyChart for appointments and assists with medication management cuing family on specific times. For simple deductive reasoning puzzle, pt identified wrong answer despite cued double check. Inattention to detail reported x1. For subsequent tasks, pt noted to crossed out all items and required intermittent min to mod SLP A to identify errors. SLP generated strategy for patient to slow rate and re-read instructions twice before beginning, which improved accuracy.   ?  ? Assessment / Recommendations / Plan  ? Plan Continue with current plan of care   ?  ? Progression Toward Goals  ? Progression toward goals Progressing toward goals   ? ?  ?  ? ?  ? ? ? SLP Education - 08/23/21 1236   ? ? Education Details double check, work slowly, re-read instructions to improve attention and comprehension   ? Person(s) Educated Patient;Child(ren)   ? Methods Explanation;Demonstration;Verbal cues;Handout   ? Comprehension Verbalized understanding;Returned demonstration;Verbal cues required;Need further instruction   ? ?  ?  ? ?  ? ? ? SLP Short Term Goals - 08/23/21 1153   ? ?  ? SLP SHORT TERM GOAL #1  ? Title pt will complete standardized testing   ?  Status Achieved   ? Target Date 08/25/21   ?  ? SLP SHORT TERM GOAL #2  ? Title Pt will verbalize 3 attention/memory compensations for making/tracking appointments, managing meds, and other daily tasks in 2 sessions   ? Baseline 08-23-21   ? Time 3   ? Period Weeks   ? Status On-going   ? Target Date 09/08/21   ?  ? SLP SHORT TERM GOAL #3  ? Title pt will verbally generate and carryover (with min A) solutions to problems encountered in everyday living in 2 sessions   ? Baseline 08-23-21   ? Time 5   ? Period Weeks   ? Status On-going   ? Target Date 09/22/21   ?  ? SLP SHORT TERM GOAL #4  ? Title pt will verbally state errors on mod complex written or verbal  tasks with nonverbal cues   ? Time 5   ? Period Weeks   ? Status On-going   ? Target Date 09/22/21   ? ?  ?  ? ?  ? ? ? SLP Long Term Goals - 08/23/21 1154   ? ?  ? SLP LONG TERM GOAL #1  ? Title Pt will verbalize AND IMPLEMEMENT 3 attention/memory compensations for making/tracking appointments, managing meds, and other daily tasks in 4 sessions   ? Time 7   ? Period Weeks   ? Status On-going   ? Target Date 10/06/21   ?  ? SLP LONG TERM GOAL #2  ? Title Pt will score higher on a patient-reported outcome measure in the last 1-2 visits than when taken in the first 2 sessions   ? Time 11   ? Period Weeks   ? Status On-going   ? Target Date 11/09/21   ?  ? SLP LONG TERM GOAL #3  ? Title pt will verbally generate and independently carryover solutions to problems encountered in everyday living with min A in 4 sessions   ? Time 11   ? Period Weeks   ? Status On-going   ? Target Date 11/09/21   ?  ? SLP LONG TERM GOAL #4  ? Title pt will note verbal or written errors in work-like tasks independently for improving verbal and/or written accuracy   ? Time 11   ? Period Weeks   ? Status On-going   ? Target Date 11/09/21   ? ?  ?  ? ?  ? ? ? Plan - 08/23/21 1225   ? ? 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 Some cognitive linguistic improvements reported at home. Structured tasks revealed reduced comprehension, attention, and problem solving, which improved with intermittent min to mod A and cued use of generated strategies. Her performance in some of these areas may decr as she progresses through chemo/rad tx due to temporary physiological changes to the brain. She will benefit from skilled ST targeting these deficits.   ? Speech Therapy Frequency 2x / week   ? Duration 12 weeks   pt may schedule x1/week for 6 weeks due to chemo/rad schedule  ? Treatment/Interventions Cognitive  reorganization;Environmental controls;Internal/external aids;Compensatory strategies;Patient/family education;Functional tasks;Cueing hierarchy   ? Potential to Achieve Goals Good   ? Consulted and Agree with Plan of Care Patient   ? ?  ?  ? ?  ? ? ?Patient will benefit from skilled therapeutic intervention in order to improve the following deficits and  impairments:   ?Cognitive communication deficit ? ? ? ?Problem List ?Patient Active Problem List  ? Diagnosis Date Noted  ? Goals of care, counseling/discussion 07/20/2021  ? 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[place]) 07/12/2021  ? Brain mass 06/30/2021  ? Hypothyroidism 06/30/2021  ? Hypokalemia 06/30/2021  ? Essential hypertension 05/25/2021  ? Hyperglycemia 07/14/2011  ? Normocytic anemia 07/13/2011  ? ? ?Marzetta Board, CCC-SLP ?08/23/2021, 12:38 PM ? ?[facility] ?[place] ?[place], [place], [address] ?Phone: [phone]   Fax:  [phone] ? ? ?Name: Denise Wright ?MRN: [id number] ?Date of Birth: 1951-03-14 ? ?

## 2021-08-23 NOTE — Therapy (Signed)
?OUTPATIENT PHYSICAL THERAPY TREATMENT NOTE ? ? ?Patient Name: Denise Wright ?MRN: [id number] ?DOB:1950/11/14, 71 y.o., female ?Today's Date: 08/23/2021 ? ?PCP: Willey Blade, MD ?REFERRING PROVIDER: Cathlyn Parsons, PA-C ? ? PT End of Session - 08/23/21 1026   ? ? Visit Number 3   ? Number of Visits 15   ? Date for PT Re-Evaluation 10/20/21   ? Progress Note Due on Visit 10   ? PT Start Time 1020   ? Equipment Utilized During Treatment Gait belt   ? Activity Tolerance Patient tolerated treatment well   ? Behavior During Therapy [facility] for tasks assessed/performed   ? ?  ?  ? ?  ? ? ?Past Medical History:  ?Diagnosis Date  ? Hyperlipidemia   ? Hypertension   ? Pneumonia   ? Thyroid disease   ? ?Past Surgical History:  ?Procedure Laterality Date  ? ABDOMINAL HYSTERECTOMY  1983  ? APPLICATION OF CRANIAL NAVIGATION Right 07/06/2021  ? Procedure: APPLICATION OF CRANIAL NAVIGATION;  Surgeon: Dawley, Theodoro Doing, DO;  Location: [facility];  Service: Neurosurgery;  Laterality: Right;  ? CRANIOTOMY Right 07/06/2021  ? Procedure: CRANIOTOMY TUMOR EXCISION;  Surgeon: Karsten Ro, DO;  Location: [facility];  Service: Neurosurgery;  Laterality: Right;  ? TONSILLECTOMY  1965  ? TOOTH EXTRACTION    ? ?Patient Active Problem List  ? Diagnosis Date Noted  ? Goals of care, counseling/discussion 07/20/2021  ? 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[place] [facility]) 07/12/2021  ? Brain mass 06/30/2021  ? Hypothyroidism 06/30/2021  ? Hypokalemia 06/30/2021  ? Essential hypertension 05/25/2021  ? Hyperglycemia 07/14/2011  ? Normocytic anemia 07/13/2011  ? ? ?REFERRING DIAG: D49.6 (ICD-10-CM) - Brain tumor ([place])  ? ?THERAPY DIAG:  ?No diagnosis found. ? ?PERTINENT HISTORY: HTN, hypothyroidism, hyperglycemia, normocytic anemia, glioblastoma with isocitrate dehydrogenase gene wildtype ? ?PRECAUTIONS: Fall ? ?SUBJECTIVE: Pt states they increased her experimental drug as seen in meds list.  HEP is going well, but resistance is too low.  Fatigue  is 6/10.  Pt states she is continuing to have mild swelling and pain in RLE, mainly bottom of foot, that goes away with elevation and returns with walking and prolonged standing.  She has started the lasix as prescribed by her MD who is aware of ongoing edema. ? ?PAIN:  ?Are you having pain? Yes: NPRS scale: 5/10 ?Pain location: RLE ?Pain description: Aching ? ?Today's Treatment (08/23/2021): ?Reviewed and adjusted HEP with resistance band with green band instead of red for following exercise:  seated hip abduction 2x8 ? ?Pt ambulates 230' w/ slow speed, narrowed BOS, and decreased arm swing R>L, during laps cued for step size and width with visual cues to floor tile.  Pt performs head turn and nods during ambulation with slight decrease in speed with head turns to the R.  Worked on changing gait speed throughout.  Pt spends less time on LLE during stance with mild bilateral trendelenburg during stance phase.  No AD used.  CGA-SBA ? ?6" stairs x8, step-to on initial trial with R rail going up and bilateral rails coming down.  Pt hesitates to step down with LLE.  Cued for step-up with reciprocal gait and R rail followed by R rail and step-through going down.  Pt has soft knee bend with left on descent so advised to continue to do step-to at home during descent for safety.  SBA. ? ?Pt walks 115' with ball toss in air cued for increased UE engagement for dual tasking.  Alt  toe taps 2x20 w/ unilateral R rail progressed to no UE support on second trial to faciliate SLS with pelvic facilitation to promote glut engagement. ? ?PATIENT EDUCATION: ?Education details: Edu on fatigue management and monitoring for safety with Hep and functional activity at home. ?Person educated: Patient ?Education method: Explanation ?Education comprehension: verbalized understanding ? ?HEP: ?Access Code: [email or web address] ? ?OBJECTIVE:  ?  ?DIAGNOSTIC FINDINGS: Pt had MRI prior to surgical resection of tumor ?  ?COGNITION: ?Overall cognitive status:  Within functional limits for tasks assessed ?  ?            ?SENSATION: ?Light touch: Appears intact ?Hot/Cold: Appears intact-pt reports no difficulty noting difference in shower and when washing hands ?  ?COORDINATION: ?Fingertip opposition: normal bilaterally ?Heel-to-shin:  normal bilaterally; inc time ?  ?EDEMA:  ?Mild bilateral LEs-pt states she has had this for a while now. ?  ?MUSCLE TONE: None noted in LE ?  ?POSTURE: rounded shoulders, forward head, and increased thoracic kyphosis ?  ?AROM/PROM:   ?  ?AROM Right ?08/11/2021 Left ?08/11/2021  ?Hip flexion [facility] WFL  ?Hip extension " "  ?Hip abduction " "  ?Hip adduction " "  ?Hip internal rotation " "  ?Hip external rotation " "  ?Knee flexion " "  ?Knee extension " "  ?Ankle dorsiflexion " "  ?Ankle plantarflexion " "  ? (Blank rows = not tested) ?MMT: ?Left shoulder 4-/5 ?MMT Right ?08/11/2021 Left ?08/11/2021  ?Hip flexion 4/5 3/5  ?Hip extension      ?Hip abduction 4/5 4/5  ?Hip adduction 5/5 5/5  ?Hip internal rotation      ?Hip external rotation      ?Knee flexion 4-/5 3+/5  ?Knee extension 4+/5 4+/5  ?Ankle dorsiflexion 5/5 4+/5; no pain  ?Ankle plantarflexion      ?Ankle inversion      ?Ankle eversion      ?(Blank rows = not tested) ?  ?TRANSFERS: ?Assistive device utilized: Environmental consultant - 2 wheeled  ?Sit to stand: Modified independence ?Stand to sit: Modified independence ?  ?STAIRS: 6", 4 step up using bilateral rails, step-to pattern, up with LLE, down with RLE.  Edu on safe stair negotiation, "up with the good, down with the bad" to prevent falls on steps into home.   ?  ?GAIT: ?Gait pattern: step to pattern, decreased stride length, and trunk flexed ?Distance walked: Several bouts in and around gym for balance and fall risk assessments. ?Assistive device utilized: Environmental consultant - 2 wheeled ?Level of assistance: SBA ?  ?FUNCTIONAL TESTs:  ?5 times sit to stand: 15.91 sec ?Timed up and go (TUG): 19.53 sec w/ RW ?10 meter walk test: (normal)=0.56 m/sec or 1.37f/sec;  (Fast)=.77 m/sec or 2.56 ft/sec  w/ RW  ?  ?ASSESSMENT: ?  ?CLINICAL IMPRESSION: ?Pt tolerates treatment well with modification of original HEP with increased resistance.  Pt is ambulating without AD with some gait deviations noted and addressed in session.  She is challenged by narrow BOS, dual tasking, environment scanning, and glut weakness.  She is making progress towards goals.  ?  ?  ?  ?OBJECTIVE IMPAIRMENTS Abnormal gait, decreased activity tolerance, decreased balance, decreased endurance, decreased mobility, difficulty walking, decreased strength, and increased edema.  ?  ?ACTIVITY LIMITATIONS cleaning, community activity, driving, meal prep, occupation, laundry, yard work, and shopping.  ?  ?PERSONAL FACTORS HTN, hypothyroidism, hyperglycemia are also affecting patient's functional outcome.  ?  ?  ?REHAB POTENTIAL: Good ?  ?CLINICAL DECISION MAKING: Evolving/moderate complexity ?  ?  EVALUATION COMPLEXITY: Moderate ?  ?  ?GOALS: ?Goals reviewed with patient? Yes ?  ?SHORT TERM GOALS: ?  ?Pt will be independent with strength and balance HEP with supervision from family. ?Baseline: to be initiated at second visit ?Target date: 09/15/2021 ?Goal status: INITIAL ?  ?2.  Pt will decrease 5xSTS to <13 seconds in order to demonstrate decreased risk for falls and improved functional bilateral LE strength and power. ?Baseline: 15.91 sec ?Target date: 09/15/2021 ?Goal status: INITIAL ?  ?3.  Pt will demonstrate TUG of <16 seconds in order to decrease risk of falls and improve functional mobility using LRAD. ?Baseline: 19.53 seconds ?Target date: 09/15/2021 ?Goal status: INITIAL ?  ?4.  Pt will demonstrate a gait speed of >2 feet/sec at normal pace w/ LRAD in order to decrease risk for falls.  ?Baseline: 1.86 ft/sec w/ RW ?Target date: 09/15/2021 ?Goal status: INITIAL ?  ?LONG TERM GOALS: ?  ?Pt will increase BERG balance score to 46/56 to demonstrate improved static balance. ?Baseline: 43/56 ?Target date: 10/20/2021 ?Goal  status: INITIAL ?  ?2.  Pt will ambulate>300' feet on 2MWT to demonstrate improved endurance for functional tasks in home and community. ?Baseline: 2MWT w/ RW: 219' & 2MWT w/o AD: 215' ?Target date: 10/20/2021

## 2021-08-23 NOTE — Therapy (Signed)
[facility] ?[place] ?[place], [place], [address] ?Phone: [phone]   Fax:  [phone] ? ?Occupational Therapy Treatment ? ?Patient Details  ?Name: Journii Nierman Say ?MRN: [id number] ?Date of Birth: [birth year] ?Referring Provider (OT): [place] f/u [place] ? ? ?Encounter Date: 08/23/2021 ? ? OT End of Session - 08/23/21 1106   ? ? Visit Number 3   ? Number of Visits 9   ? Date for OT Re-Evaluation 10/06/21   ? Authorization Type BCBS   ? Authorization Time Period VL: 30 PT/OT combined, 30 ST, Auth NOT req'd   ? OT Start Time 1104   ? OT Stop Time 1145   ? OT Time Calculation (min) 41 min   ? Activity Tolerance Patient tolerated treatment well;Patient limited by fatigue   ? Behavior During Therapy [facility] for tasks assessed/performed   ? ?  ?  ? ?  ? ? ?Past Medical History:  ?Diagnosis Date  ? Hyperlipidemia   ? Hypertension   ? Pneumonia   ? Thyroid disease   ? ? ?Past Surgical History:  ?Procedure Laterality Date  ? ABDOMINAL HYSTERECTOMY  1983  ? APPLICATION OF CRANIAL NAVIGATION Right 07/06/2021  ? Procedure: APPLICATION OF CRANIAL NAVIGATION;  Surgeon: Dawley, Theodoro Doing, DO;  Location: [facility];  Service: Neurosurgery;  Laterality: Right;  ? CRANIOTOMY Right 07/06/2021  ? Procedure: CRANIOTOMY TUMOR EXCISION;  Surgeon: Karsten Ro, DO;  Location: [facility];  Service: Neurosurgery;  Laterality: Right;  ? TONSILLECTOMY  1965  ? TOOTH EXTRACTION    ? ? ?There were no vitals filed for this visit. ? ? Subjective Assessment - 08/23/21 1105   ? ? Subjective  "I feel good"   ? Patient is accompanied by: Family member   ? Pertinent History PMH: HTN, HLD, Pneumonia, Thyroid Disease, Active Cancer   ? Limitations GBM with chemotherapy/radiation treatment currently   ? Patient Stated Goals be independent again   ? Currently in Pain? Yes   ? Pain Score 7    ? Pain Location Leg   ? Pain Orientation Right   ? Pain Descriptors / Indicators Aching;Sore   ? Pain Type Chronic  pain   ? Pain Onset More than a month ago   ? Pain Frequency Constant   ? Aggravating Factors  finished walking   ? ?  ?  ? ?  ? ? ?24 pc puzzle with mod cues and increased time for completing. Pt req'd cues for attending to details on pieces and putting parts to whole.  ? ?MVPT 68.5% accuracy with majority of errors in visual closure and visual memory. ? ? ? ? ? ? ? ? ? ? ? ? ? ? ? ? ? ? ? ? OT Education - 08/23/21 1116   ? ? Education Details energy conservation strategies - see pt instructions   ? Person(s) Educated Patient;Child(ren)   ? Methods Demonstration;Explanation   ? Comprehension Verbalized understanding;Returned demonstration   ? ?  ?  ? ?  ? ? ? OT Short Term Goals - 08/23/21 1106   ? ?  ? OT SHORT TERM GOAL #1  ? Title Pt will be independent with initial HEP for grip strength in LUE   ? Time 4   ? Period Weeks   ? Status Achieved   independent with yellow theraputty  ? Target Date 09/08/21   ? ?  ?  ? ?  ? ? ? ? OT Long Term Goals - 08/11/21  1036   ? ?  ? OT LONG TERM GOAL #1  ? Title Pt will be independent with updated HEP   ? Time 8   ? Period Weeks   ? Status New   ? Target Date 10/06/21   ?  ? OT LONG TERM GOAL #2  ? Title Pt will verbalize understanding of energy conservation strategies   ? Time 8   ? Period Weeks   ? Status New   ?  ? OT LONG TERM GOAL #3  ? Title Pt complete all basic ADLs with Mod I and good safety awareness with ecxeption of compression stockings.   ? Baseline min A   ? Time 8   ? Period Weeks   ? Status New   ?  ? OT LONG TERM GOAL #4  ? Title Pt will perform environmental scanning with cog component with 90% accuracy or greater.   ? Time 8   ? Period Weeks   ? Status New   ?  ? OT LONG TERM GOAL #5  ? Title Pt will perform simple warm meal prep and/or light housekeeping with good safety awareness and distant supervision   ? Time 8   ? Period Weeks   ? Status New   ? ?  ?  ? ?  ? ? ? ? ? ? ? ? Plan - 08/23/21 1201   ? ? Clinical Impression Statement Pt with some difficulty  today with attention to details with visual tasks.   ? OT Occupational Profile and History Problem Focused Assessment - Including review of records relating to presenting problem   ? Occupational performance deficits (Please refer to evaluation for details): IADL's;ADL's;Leisure   ? Body Structure / Function / Physical Skills ADL;Decreased knowledge of use of DME;Strength;UE functional use;ROM;IADL   ? Cognitive Skills Attention;Safety Awareness;Problem Solve;Energy/Drive   ? Rehab Potential Good   ? Clinical Decision Making Limited treatment options, no task modification necessary   ? Comorbidities Affecting Occupational Performance: None   ? Modification or Assistance to Complete Evaluation  No modification of tasks or assist necessary to complete eval   ? OT Frequency 1x / week   ? OT Duration 8 weeks   ? OT Treatment/Interventions Self-care/ADL training;Functional Mobility Training;Neuromuscular education;Patient/family education;Therapeutic exercise;Cognitive remediation/compensation;Visual/perceptual remediation/compensation;Therapeutic activities;DME and/or AE instruction;Energy conservation   ? Plan vis/cog task, energy conservation strategies, dynamic standing balance   ? Consulted and Agree with Plan of Care Patient;Family member/caregiver   ? Family Member Consulted daughter   ? ?  ?  ? ?  ? ? ?Patient will benefit from skilled therapeutic intervention in order to improve the following deficits and impairments:   ?Body Structure / Function / Physical Skills: ADL, Decreased knowledge of use of DME, Strength, UE functional use, ROM, IADL ?Cognitive Skills: Attention, Safety Awareness, Problem Solve, Energy/Drive ?  ? ? ?Visit Diagnosis: ?Other lack of coordination ? ?Frontal lobe and executive function deficit ? ?Other abnormalities of gait and mobility ? ?Visuospatial deficit ? ?Muscle weakness (generalized) ? ?Unsteadiness on feet ? ?Attention and concentration deficit ? ? ? ?Problem List ?Patient Active  Problem List  ? Diagnosis Date Noted  ? Goals of care, counseling/discussion 07/20/2021  ? 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[place]) 07/12/2021  ? Brain mass 06/30/2021  ? Hypothyroidism 06/30/2021  ? Hypokalemia 06/30/2021  ? Essential hypertension 05/25/2021  ? Hyperglycemia 07/14/2011  ? Normocytic anemia 07/13/2011  ? ? ?Zachery Conch, OT ?08/23/2021, 12:05 PM ? ?[facility] ?[facility]  [place] ?[place], [place], [address] ?Phone: [phone]   Fax:  [phone] ? ?Name: Ayven Glasco Bill ?MRN: [id number] ?Date of Birth: 12-15-1950 ? ?

## 2021-08-24 ENCOUNTER — Other Ambulatory Visit: Payer: Self-pay

## 2021-08-24 ENCOUNTER — Other Ambulatory Visit: Payer: Self-pay | Admitting: Emergency Medicine

## 2021-08-24 ENCOUNTER — Inpatient Hospital Stay: Payer: BC Managed Care – PPO

## 2021-08-24 ENCOUNTER — Ambulatory Visit
Admission: RE | Admit: 2021-08-24 | Discharge: 2021-08-24 | Disposition: A | Discharge status: Home / Self Care | Payer: BC Managed Care – PPO | Source: Ambulatory Visit | Attending: Radiation Oncology | Admitting: Radiation Oncology

## 2021-08-24 DIAGNOSIS — C719 Malignant neoplasm of brain, unspecified: Secondary | ICD-10-CM | POA: Diagnosis not present

## 2021-08-24 LAB — RESEARCH LABS

## 2021-08-25 ENCOUNTER — Other Ambulatory Visit: Payer: Self-pay | Admitting: Emergency Medicine

## 2021-08-25 ENCOUNTER — Ambulatory Visit
Admission: RE | Admit: 2021-08-25 | Discharge: 2021-08-25 | Disposition: A | Discharge status: Home / Self Care | Payer: BC Managed Care – PPO | Source: Ambulatory Visit | Attending: Radiation Oncology | Admitting: Radiation Oncology

## 2021-08-25 DIAGNOSIS — C719 Malignant neoplasm of brain, unspecified: Secondary | ICD-10-CM

## 2021-08-28 ENCOUNTER — Inpatient Hospital Stay: Payer: BC Managed Care – PPO | Admitting: Emergency Medicine

## 2021-08-28 ENCOUNTER — Encounter: Payer: Self-pay | Admitting: Internal Medicine

## 2021-08-28 ENCOUNTER — Ambulatory Visit
Admission: RE | Admit: 2021-08-28 | Discharge: 2021-08-28 | Disposition: A | Discharge status: Home / Self Care | Payer: BC Managed Care – PPO | Source: Ambulatory Visit | Attending: Radiation Oncology | Admitting: Radiation Oncology

## 2021-08-28 ENCOUNTER — Other Ambulatory Visit: Payer: Self-pay

## 2021-08-28 DIAGNOSIS — C719 Malignant neoplasm of brain, unspecified: Secondary | ICD-10-CM

## 2021-08-28 NOTE — Research (Signed)
[id number] A Single Arm, Pilot Study of Ramipril for Preventing Radiation-Induced Cognitive Decline in Glioblastoma (GBM) Patients Receiving Brain Radiotherapy ?  ?WEEK 3 TITRATION (Week 4 Tx Schedule) ?  ?Patient arrives today (08/28/21) Accompanied by [name] for the Week 3 Titration visit visit.  ?  ?LABS: ?Mandatory labs are collected per consent and study protocol on 08/24/21.  Confirmed that patient took 4 doses of Ramipril before labs were drawn.  Patient Denise Wright tolerated well without complaint.  Abnormal labs reviewed in AE section below. ?  ?MEDICATION REVIEW: ?Patient reviews and verifies the current medication list is correct.  Reported changes were recorded on the medication list and marked as reviewed. All other listed medications remain the same. ?  ?VITAL SIGNS: ?Vital signs are collected per study protocol.  BP today 112/67. ?  ?ADVERSE EVENTS:  All graded using CTCAE v. 5.0 ?Patient reports ongoing bilateral ankle pain/arthralgia (Grade 1 for both) with no change, remains unrelated to study drug, no action or reporting required at this time. ?Patient reports ongoing bilateral leg swelling/edema limbs (Grade 2), remains unrelated to study drug, no action or reporting required at this time. ?Patient reports fatigue (Grade 2) since Sunday (08/27/2021), no intervention.  Per MD Vaslow unrelated to study drug, no action or reporting required at this time. ?CMP done on 08/24/21 shows the following: ?Increased creatinine (Grade 1) of 1.36 which is above study lab limits.  As per protocol patient will have their dose of Ramipril decreased from 4 pills daily to 2 pills daily.   Per MD Vaslow still unrelated to study drug (attributed to lasix and temodar), no reporting or action required at this time. ?eGFR is decreased at 42 ml/min but does not meet requirements for Chronic kidney disease, no intervention.  Per MD Vaslow this value is clinically insignificant at this time and unrelated to study drug  (attributed to lasix and temodar).  GFR has not decreased by or more than 20% from previous value (see CRF). ?Hyperglycemia (Grade 1) with glucose of 116, no intervention.  Per MD Vaslow unrelated to study drug, no action or reporting required at this time. ?Hypoalbuminemia of 3.7 (Grade 1), no intervention.  Per MD Vaslow unrelated to study drug, no reporting or action required at this time. ?Hypernatremia of 145 (Grade 1), no intervention.  Per MD Vaslow unrelated to study drug, no reporting or action required at this time ?  ?ADVERSE EVENT LOG:  Denise Wright ?[id number] ?[id number] ?  ?08/28/2021 ?  ?Adverse Event Log ?  ?Study/Protocol: WF 1801 ?Cycle: Week 3 Titration ?  ?Event Grade Onset Date Resolved Date Drug Name Attribution Treatment Comments  ?Arthralgia (L ankle pain) Grade 1 08/09/21 Ongoing Ramipril Unrelated Sports balm, elevation, epsom salts    ?Arthralgia (R ankle pain) Grade 1 08/13/21 Ongoing Ramipril Unrelated Sports balm, elevation, epsom salts    ?Increased creatinine Grade 1 08/10/21 Ongoing Ramipril Unrelated Decreased ramipril dose    ?Hypoalbuminemia Grade 1 08/24/21 Ongoing Ramipril Unrelated None    ?Hyperglycemia Grade 1 08/17/21 Ongoing Ramipril Unrelated None    ?Edema limbs (bilateral leg swelling) Grade 2 08/15/21 Ongoing Ramipril Unrelated Lasix    ?Fatigue Grade 2 08/27/21 Ongoing Ramipril Unrelated None   ?Hypernatremia Grade 1 08/24/21 Ongoing Ramipril Unrelated None   ?  ?  ?MEDICATION DIARIES and IRT: ?Patient returned Week 3 medication diaries today.  Per study diaries, 7 doses were taken for a 100% compliance rate. Bottle #1 (Lot [id number]) is returned with 29 remaining  tablets. Patient also brings her pill case which contained 4 pills in each of the Tuesday-Saturday slots (20 pills total).  Patient also reports already taking 2 tablets today.  Total pill count is 82.  Confirmed pill count with Teldrin in Pharmacy.  Based on returned medication all doses were taken with a compliance  rate of 100%.  Patient is reminded of the importance of taking study medications as directed, pt verbalizes understanding. Bottle returned to patient along with drug diaries.  Copy of drug diary made and original returned to patient.  Patient does not meet requirements for remaining on maximum titration dose of study medication due elevated creatinine level.  Pt instructed to start taking 2 capsules daily starting today (08/28/21), verbalized understanding. ?Treatment plan for 4 capsules daily (5 mg total) not released as patient staying on 2 pills daily (2.5 mg total) dose. ?  ?DISPOSITION: ?Upon completion off all study requirements, patient was escorted to exit with daughter and belongings.  ?  ?The patient was thanked for their time and continued voluntary participation in this study. Patient Denise Wright has been provided direct contact information and is encouraged to contact this Nurse for any needs or questions. ? ?Denise Guiles 'Learta Codding' Harrell Niehoff, RN, BSN ?Clinical Research Nurse I ?08/31/21 ?10:06 AM ? ?

## 2021-08-29 ENCOUNTER — Ambulatory Visit
Admission: RE | Admit: 2021-08-29 | Discharge: 2021-08-29 | Disposition: A | Discharge status: Home / Self Care | Payer: BC Managed Care – PPO | Source: Ambulatory Visit | Attending: Radiation Oncology | Admitting: Radiation Oncology

## 2021-08-29 DIAGNOSIS — C719 Malignant neoplasm of brain, unspecified: Secondary | ICD-10-CM | POA: Diagnosis not present

## 2021-08-30 ENCOUNTER — Other Ambulatory Visit: Payer: Self-pay

## 2021-08-30 ENCOUNTER — Encounter: Payer: Self-pay | Admitting: Internal Medicine

## 2021-08-30 ENCOUNTER — Ambulatory Visit
Admission: RE | Admit: 2021-08-30 | Discharge: 2021-08-30 | Disposition: A | Discharge status: Home / Self Care | Payer: BC Managed Care – PPO | Source: Ambulatory Visit | Attending: Radiation Oncology | Admitting: Radiation Oncology

## 2021-08-30 DIAGNOSIS — C719 Malignant neoplasm of brain, unspecified: Secondary | ICD-10-CM | POA: Diagnosis not present

## 2021-08-31 ENCOUNTER — Ambulatory Visit
Admission: RE | Admit: 2021-08-31 | Discharge: 2021-08-31 | Disposition: A | Discharge status: Home / Self Care | Payer: BC Managed Care – PPO | Source: Ambulatory Visit | Attending: Radiation Oncology | Admitting: Radiation Oncology

## 2021-08-31 ENCOUNTER — Other Ambulatory Visit: Payer: Self-pay

## 2021-08-31 ENCOUNTER — Encounter: Payer: BC Managed Care – PPO | Admitting: Emergency Medicine

## 2021-08-31 ENCOUNTER — Inpatient Hospital Stay (HOSPITAL_BASED_OUTPATIENT_CLINIC_OR_DEPARTMENT_OTHER): Payer: BC Managed Care – PPO | Admitting: Internal Medicine

## 2021-08-31 ENCOUNTER — Encounter: Payer: Self-pay | Admitting: Internal Medicine

## 2021-08-31 ENCOUNTER — Inpatient Hospital Stay: Payer: BC Managed Care – PPO

## 2021-08-31 VITALS — BP 107/72 | HR 89 | Temp 98.5°F | Resp 16 | Wt 221.6 lb

## 2021-08-31 DIAGNOSIS — C719 Malignant neoplasm of brain, unspecified: Secondary | ICD-10-CM

## 2021-08-31 LAB — CMP (CANCER CENTER ONLY)
ALT: 12 U/L (ref 0–44)
AST: 17 U/L (ref 15–41)
Albumin: 3.5 g/dL (ref 3.5–5.0)
Alkaline Phosphatase: 91 U/L (ref 38–126)
Anion gap: 6 (ref 5–15)
BUN: 21 mg/dL (ref 8–23)
CO2: 30 mmol/L (ref 22–32)
Calcium: 9 mg/dL (ref 8.9–10.3)
Chloride: 106 mmol/L (ref 98–111)
Creatinine: 1.28 mg/dL — ABNORMAL HIGH (ref 0.44–1.00)
GFR, Estimated: 45 mL/min — ABNORMAL LOW (ref >60)
Glucose, Bld: 106 mg/dL — ABNORMAL HIGH (ref 70–99)
Potassium: 3.9 mmol/L (ref 3.5–5.1)
Sodium: 142 mmol/L (ref 135–145)
Total Bilirubin: 0.4 mg/dL (ref 0.3–1.2)
Total Protein: 6.7 g/dL (ref 6.5–8.1)

## 2021-08-31 LAB — CBC WITH DIFFERENTIAL (CANCER CENTER ONLY)
Abs Immature Granulocytes: 0.02 10*3/uL (ref 0.00–0.07)
Basophils Absolute: 0 10*3/uL (ref 0.0–0.1)
Basophils Relative: 1 %
Eosinophils Absolute: 0.3 10*3/uL (ref 0.0–0.5)
Eosinophils Relative: 6 %
HCT: 30.9 % — ABNORMAL LOW (ref 36.0–46.0)
Hemoglobin: 10 g/dL — ABNORMAL LOW (ref 12.0–15.0)
Immature Granulocytes: 0 %
Lymphocytes Relative: 15 %
Lymphs Abs: 0.7 10*3/uL (ref 0.7–4.0)
MCH: 30.8 pg (ref 26.0–34.0)
MCHC: 32.4 g/dL (ref 30.0–36.0)
MCV: 95.1 fL (ref 80.0–100.0)
Monocytes Absolute: 0.5 10*3/uL (ref 0.1–1.0)
Monocytes Relative: 10 %
Neutro Abs: 3.3 10*3/uL (ref 1.7–7.7)
Neutrophils Relative %: 68 %
Platelet Count: 315 10*3/uL (ref 150–400)
RBC: 3.25 MIL/uL — ABNORMAL LOW (ref 3.87–5.11)
RDW: 14.1 % (ref 11.5–15.5)
WBC Count: 4.9 10*3/uL (ref 4.0–10.5)
nRBC: 0 % (ref 0.0–0.2)

## 2021-08-31 LAB — RESEARCH LABS

## 2021-08-31 MED ORDER — FAMOTIDINE 20 MG PO TABS: 20.0000 mg | ORAL_TABLET | Freq: Every day | ORAL | 2 refills | Status: AC

## 2021-08-31 NOTE — Progress Notes (Signed)
? ?[facility] [address]  ?[place], [place] [address] ?[phone] ? ? ?Interval Evaluation ? ?Date of Service: 08/31/21 ?Patient Name: Denise Wright ?Patient MRN: [id number] ?Patient DOB: 1950/06/27 ?Provider: Ventura Sellers, MD ? ?Identifying Statement:  ?Denise Wright is a 71 y.o. female with right temporal glioblastoma  ? 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 Right temporal craniotomy, resection with Dr. Reatha Armour; path is glioblastoma [id number]-1wt ?  ?07/12/2021 Initial Diagnosis  ? 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  ?08/07/2021 -  Chemotherapy  ? Patient is on Treatment Plan : BRAIN GLIOBLASTOMA Radiation Therapy With Concurrent Temozolomide 75 mg/m2 Daily Followed By Sequential Maintenance Temozolomide x 6-12 cycles  ?   ? ? ?Biomarkers: ? ?MGMT Unknown.  ?IDH 1/2 Wild type.  ?EGFR Unknown  ?TERT Unknown  ? ?Interval History: ?Denise Wright presents today for follow up, now having completed 4 weeks of radiation and Temodar.  She reports overall stability of left sided weakness.  No issues tolerating therapy. Continues to do some walking on her own around the home, although typically is relying on a walker.  Fatigue has been more of a problem the past two weeks.  Denies seizures or headaches today. ? ?H+P (07/20/21) Patient presented to medical attention last month with several days, weeks of progressive mental status changes.  Family noticed she was confused and disoriented over normal conversation and activities of daily living.  Baseline she is functionally independent, lives alone, works full time with Museum/gallery exhibitions officer company.  CNS imaging demonstrated an enhancing mass in the right temporal lobe; she underwent craniotomy, resection with Dr. Reatha Armour on 07/06/21, which demonstrated glioblastoma.  Following surgery she experienced worsening left sided  weakness, which prompted rehabilitation admission.  At present, she is doing some walking on her own, albeit slowly.  Left arm and hand have improved, she is starting occupational therapy this week. ? ?Medications: ?Current Outpatient Medications on File Prior to Visit  ?Medication Sig Dispense Refill  ? acetaminophen (TYLENOL) 325 MG tablet Take 2 tablets (650 mg total) by mouth every 6 (six) hours as needed for mild pain (or Fever >/= 101).    ? ALPRAZolam (XANAX) 0.25 MG tablet Take 1 tablet (0.25 mg total) by mouth 2 (two) times daily as needed for anxiety. 10 tablet 0  ? b complex vitamins capsule Take 1 capsule by mouth daily.    ? cholecalciferol (VITAMIN D) 1000 UNITS tablet Take 2,000 Units by mouth daily.    ? docusate sodium (COLACE) 100 MG capsule Take 1 capsule (100 mg total) by mouth 2 (two) times daily. 10 capsule 0  ? famotidine (PEPCID) 20 MG tablet Take 1 tablet (20 mg total) by mouth daily. 30 tablet 0  ? fluticasone (FLONASE) 50 MCG/ACT nasal spray Place 1 spray into both nostrils daily as needed for allergies. OTC    ? furosemide (LASIX) 20 MG tablet Take 1 tablet (20 mg total) by mouth daily. 30 tablet 1  ? hydrochlorothiazide (HYDRODIURIL) 12.5 MG tablet Take 0.5 tablets (6.25 mg total) by mouth daily. 30 tablet 0  ? HYDROcodone-acetaminophen (NORCO/VICODIN) 5-325 MG tablet Take 1 tablet by mouth every 4 (four) hours as needed for moderate pain. (Patient not taking: Reported on 08/01/2021) 30 tablet 0  ? Investigational Ramipril 1.25 MG capsule WF-1801 Take 4 capsules (5 mg total) by mouth at bedtime for 7 days. Take at bedtime with  or without food. Drink adequate water to avoid dehydration. Avoid salt substitutes that are high in potassium.  0  ? levothyroxine (SYNTHROID) 50 MCG tablet Take 1 tablet (50 mcg total) by mouth daily. 30 tablet 0  ? Multiple Vitamins-Minerals (MULTIVITAMIN WITH MINERALS) tablet Take 1 tablet by mouth daily.    ? NON FORMULARY Apply topically as needed. [place] Sport  Balm ?Apply to skin as needed for joint/muscle pain    ? ondansetron (ZOFRAN) 8 MG tablet Take 1 tablet (8 mg total) by mouth 2 (two) times daily as needed (nausea and vomiting). May take 30-60 minutes prior to Temodar administration if nausea/vomiting occurs. (Patient not taking: Reported on 08/03/2021) 30 tablet 1  ? temozolomide (TEMODAR) 140 MG capsule Take 1 capsule (140 mg total) by mouth daily. May take on an empty stomach to decrease nausea & vomiting. (Patient not taking: Reported on 08/03/2021) 42 capsule 0  ? ?No current facility-administered medications on file prior to visit.  ? ? ?Allergies:  ?Allergies  ?Allergen Reactions  ? Fexofenadine Hives  ? Penicillins Itching and Other (See Comments)  ?  Pulling sensation in body  ? ?Past Medical History:  ?Past Medical History:  ?Diagnosis Date  ? Hyperlipidemia   ? Hypertension   ? Pneumonia   ? Thyroid disease   ? ?Past Surgical History:  ?Past Surgical History:  ?Procedure Laterality Date  ? ABDOMINAL HYSTERECTOMY  1983  ? APPLICATION OF CRANIAL NAVIGATION Right 07/06/2021  ? Procedure: APPLICATION OF CRANIAL NAVIGATION;  Surgeon: Dawley, Theodoro Doing, DO;  Location: [facility];  Service: Neurosurgery;  Laterality: Right;  ? CRANIOTOMY Right 07/06/2021  ? Procedure: CRANIOTOMY TUMOR EXCISION;  Surgeon: Karsten Ro, DO;  Location: [facility];  Service: Neurosurgery;  Laterality: Right;  ? TONSILLECTOMY  1965  ? TOOTH EXTRACTION    ? ?Social History:  ?Social History  ? ?Socioeconomic History  ? Marital status: Divorced  ?  Spouse name: Not on file  ? Number of children: 3  ? Years of education: 45  ? Highest education level: Not on file  ?Occupational History  ? Occupation: Masters degree in public service  ?  Employer: [employer]  ? Occupation: Working on Engineer, maintenance  ? Occupation: Training and development officer of [employer]  ?Tobacco Use  ? Smoking status: Never  ? Smokeless tobacco: Never  ?Vaping Use  ? Vaping Use: Never used   ?Substance and Sexual Activity  ? Alcohol use: No  ? Drug use: No  ? Sexual activity: Yes  ?  Birth control/protection: Surgical  ?Other Topics Concern  ? Not on file  ?Social History Narrative  ? Divorced, lives in [place] alone.  Independent of ADLs.  ? ?Social Determinants of Health  ? ?Financial Resource Strain: Not on file  ?Food Insecurity: Not on file  ?Transportation Needs: Not on file  ?Physical Activity: Not on file  ?Stress: Not on file  ?Social Connections: Not on file  ?Intimate Partner Violence: Not on file  ? ?Family History:  ?Family History  ?Problem Relation Age of Onset  ? Diabetes Mother   ? Hypertension Mother   ? Hypertension Father   ? Leukemia Father   ? Stroke Father   ? Cancer Brother   ? Pneumonia Brother   ? ? ?Review of Systems: ?Constitutional: Doesn't report fevers, chills or abnormal weight loss ?Eyes: Doesn't report blurriness of vision ?Ears, nose, mouth, throat, and face: Doesn't report sore throat ?Respiratory: Doesn't report cough, dyspnea or  wheezes ?Cardiovascular: Doesn't report palpitation, chest discomfort  ?Gastrointestinal:  Doesn't report nausea, constipation, diarrhea ?GU: Doesn't report incontinence ?Skin: Doesn't report skin rashes ?Neurological: Per HPI ?Musculoskeletal: Doesn't report joint pain ?Behavioral/Psych: Doesn't report anxiety ? ?Physical Exam: ?Vitals:  ? 08/31/21 1539  ?BP: 107/72  ?Pulse: 89  ?Resp: 16  ?Temp: 98.5 ?F (36.9 ?C)  ?SpO2: 100%  ? ? ?KPS: 70 ?ECOG 1 ?General: Alert, cooperative, pleasant, in no acute distress ?Head: Normal ?EENT: No conjunctival injection or scleral icterus.  ?Lungs: Resp effort normal ?Cardiac: Regular rate ?Abdomen: Non-distended abdomen ?Skin: No rashes cyanosis or petechiae. ?Extremities: 2+ dependent edema ? ?Neurologic Exam: ?Mental Status: Awake, alert, attentive to examiner. Oriented to self and environment. Language is fluent with intact comprehension.  ?Cranial Nerves: Visual acuity is grossly normal. Visual  fields are full. Extra-ocular movements intact. No ptosis. Face is symmetric ?Motor: Tone and bulk are normal. Power is 4+/5 in left arm and leg. Reflexes are symmetric, no pathologic reflexes present.  ?Sensory:

## 2021-09-01 ENCOUNTER — Encounter: Payer: Self-pay | Admitting: Physical Therapy

## 2021-09-01 ENCOUNTER — Other Ambulatory Visit: Payer: Self-pay

## 2021-09-01 ENCOUNTER — Ambulatory Visit: Payer: BC Managed Care – PPO | Admitting: Occupational Therapy

## 2021-09-01 ENCOUNTER — Ambulatory Visit: Payer: BC Managed Care – PPO | Admitting: Physical Therapy

## 2021-09-01 ENCOUNTER — Encounter: Payer: Self-pay | Admitting: Occupational Therapy

## 2021-09-01 ENCOUNTER — Ambulatory Visit
Admission: RE | Admit: 2021-09-01 | Discharge: 2021-09-01 | Disposition: A | Discharge status: Home / Self Care | Payer: BC Managed Care – PPO | Source: Ambulatory Visit | Attending: Radiation Oncology | Admitting: Radiation Oncology

## 2021-09-01 ENCOUNTER — Ambulatory Visit: Payer: BC Managed Care – PPO

## 2021-09-01 DIAGNOSIS — R2681 Unsteadiness on feet: Secondary | ICD-10-CM

## 2021-09-01 DIAGNOSIS — R278 Other lack of coordination: Secondary | ICD-10-CM

## 2021-09-01 DIAGNOSIS — C719 Malignant neoplasm of brain, unspecified: Secondary | ICD-10-CM | POA: Diagnosis not present

## 2021-09-01 DIAGNOSIS — R41840 Attention and concentration deficit: Secondary | ICD-10-CM

## 2021-09-01 DIAGNOSIS — R41841 Cognitive communication deficit: Secondary | ICD-10-CM

## 2021-09-01 DIAGNOSIS — M6281 Muscle weakness (generalized): Secondary | ICD-10-CM

## 2021-09-01 DIAGNOSIS — R41844 Frontal lobe and executive function deficit: Secondary | ICD-10-CM

## 2021-09-01 DIAGNOSIS — R2689 Other abnormalities of gait and mobility: Secondary | ICD-10-CM

## 2021-09-01 DIAGNOSIS — R41842 Visuospatial deficit: Secondary | ICD-10-CM

## 2021-09-01 NOTE — Therapy (Signed)
?OUTPATIENT PHYSICAL THERAPY TREATMENT NOTE ? ? ?Patient Name: Denise Wright ?MRN: [id number] ?DOB:09-18-1950, 71 y.o., female ?Today's Date: 09/01/2021 ? ?PCP: Willey Blade, MD ?REFERRING PROVIDER: Cathlyn Parsons, PA-C ? ? PT End of Session - 09/01/21 1151   ? ? Visit Number 4   ? Number of Visits 15   ? Date for PT Re-Evaluation 10/20/21   ? Progress Note Due on Visit 10   ? PT Start Time 1106   ? PT Stop Time 1145   ? PT Time Calculation (min) 39 min   ? Equipment Utilized During Treatment Gait belt   ? Activity Tolerance Patient tolerated treatment well   ? Behavior During Therapy [facility] for tasks assessed/performed   ? ?  ?  ? ?  ? ? ? ?Past Medical History:  ?Diagnosis Date  ? Hyperlipidemia   ? Hypertension   ? Pneumonia   ? Thyroid disease   ? ?Past Surgical History:  ?Procedure Laterality Date  ? ABDOMINAL HYSTERECTOMY  1983  ? APPLICATION OF CRANIAL NAVIGATION Right 07/06/2021  ? Procedure: APPLICATION OF CRANIAL NAVIGATION;  Surgeon: Dawley, Theodoro Doing, DO;  Location: [facility];  Service: Neurosurgery;  Laterality: Right;  ? CRANIOTOMY Right 07/06/2021  ? Procedure: CRANIOTOMY TUMOR EXCISION;  Surgeon: Karsten Ro, DO;  Location: [facility];  Service: Neurosurgery;  Laterality: Right;  ? TONSILLECTOMY  1965  ? TOOTH EXTRACTION    ? ?Patient Active Problem List  ? Diagnosis Date Noted  ? Goals of care, counseling/discussion 07/20/2021  ? 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 Brain mass 06/30/2021  ? Hypothyroidism 06/30/2021  ? Hypokalemia 06/30/2021  ? Essential hypertension 05/25/2021  ? Hyperglycemia 07/14/2011  ? Normocytic anemia 07/13/2011  ? ? ?REFERRING DIAG: D49.6 (ICD-10-CM) - Brain tumor ([place])  ? ?THERAPY DIAG:  ?Other abnormalities of gait and mobility ? ?Muscle weakness (generalized) ? ?Unsteadiness on feet ? ?PERTINENT HISTORY: HTN, hypothyroidism, hyperglycemia, normocytic anemia, glioblastoma with isocitrate dehydrogenase gene wildtype ? ?PRECAUTIONS:  Fall ? ?SUBJECTIVE: Her appt with Dr. Mickeal Skinner was 08/31/2021 and she states he is pleased with her progress and so is she.  Her HEP is going well and she was able to use the handicap entrance and walk car<>[place] without AD, no falls or LOB.  She wants to keep working on walking faster. ? ?PAIN:  ?Are you having pain? No ? ?Today's Treatment (08/23/2021): ?Dynamic warmup using NuStep x16mns L3 for aerobic training and self-pacing of activity w/ step goal of 40-45, achieved 46 avg steps/min, pt demos improved use of LE and improved reciprocal movement and engagement to task  ?Pt ambulates up and down ramp x5 w/ CGA during descent due to distractibility during task.  Pt standing on incline feet together EO>EC with minimal sway, no LOB.  Progressed to alt semi-tandem on incline EO>EC, R lateral LOB during R forward semi-tandem with EC, pt uses lateral step to recover. ?Pt faces wall in hallway performing side stepping with ball toss to wall, progressed to added toss to ground between side steps for inc dual task challenge, pt required inc one step cuing to complete additional task sequence. ? ?PATIENT EDUCATION: ?Education details: Edu on walking at home for activity tolerance with family member for safety. ?Person educated: Patient, daughter ?Education method: Explanation ?Education comprehension: verbalized understanding ? ?HEP: ?Access Code: [email or web address]?Initiated walking program 09/01/2021 with supervision in home environment/neighborhood on level paved surface w/ or w/o AD for endurance. ? ?OBJECTIVE:  ?  ?DIAGNOSTIC  FINDINGS: Pt had MRI prior to surgical resection of tumor ?  ?COGNITION: ?Overall cognitive status: Within functional limits for tasks assessed ?  ?            ?SENSATION: ?Light touch: Appears intact ?Hot/Cold: Appears intact-pt reports no difficulty noting difference in shower and when washing hands ?  ?COORDINATION: ?Fingertip opposition: normal bilaterally ?Heel-to-shin:  normal  bilaterally; inc time ?  ?EDEMA:  ?Mild bilateral LEs-pt states she has had this for a while now. ?  ?MUSCLE TONE: None noted in LE ?  ?POSTURE: rounded shoulders, forward head, and increased thoracic kyphosis ?  ?AROM/PROM:   ?  ?AROM Right ?08/11/2021 Left ?08/11/2021  ?Hip flexion [facility] WFL  ?Hip extension " "  ?Hip abduction " "  ?Hip adduction " "  ?Hip internal rotation " "  ?Hip external rotation " "  ?Knee flexion " "  ?Knee extension " "  ?Ankle dorsiflexion " "  ?Ankle plantarflexion " "  ? (Blank rows = not tested) ?MMT: ?Left shoulder 4-/5 ?MMT Right ?08/11/2021 Left ?08/11/2021  ?Hip flexion 4/5 3/5  ?Hip extension      ?Hip abduction 4/5 4/5  ?Hip adduction 5/5 5/5  ?Hip internal rotation      ?Hip external rotation      ?Knee flexion 4-/5 3+/5  ?Knee extension 4+/5 4+/5  ?Ankle dorsiflexion 5/5 4+/5; no pain  ?Ankle plantarflexion      ?Ankle inversion      ?Ankle eversion      ?(Blank rows = not tested) ?  ?TRANSFERS: ?Assistive device utilized: Environmental consultant - 2 wheeled  ?Sit to stand: Modified independence ?Stand to sit: Modified independence ?  ?STAIRS: 6", 4 step up using bilateral rails, step-to pattern, up with LLE, down with RLE.  Edu on safe stair negotiation, "up with the good, down with the bad" to prevent falls on steps into home.   ?  ?GAIT: ?Gait pattern: step to pattern, decreased stride length, and trunk flexed ?Distance walked: Several bouts in and around gym for balance and fall risk assessments. ?Assistive device utilized: Environmental consultant - 2 wheeled ?Level of assistance: SBA ?  ?FUNCTIONAL TESTs:  ?5 times sit to stand: 15.91 sec ?Timed up and go (TUG): 19.53 sec w/ RW ?10 meter walk test: (normal)=0.56 m/sec or 1.37f/sec; (Fast)=.77 m/sec or 2.56 ft/sec  w/ RW  ?  ?ASSESSMENT: ?  ?CLINICAL IMPRESSION: ?Focused skilled session on aerobic activity and dynamic balance challenges with decreased visual reliance and unlevel surface.  Progressed session to cognitive dual task with pt requiring inc cuing to  complete.  She was mildly more distractible during static balance tasks today requiring simple redirection to maintain balance.  Continue per POC. ?  ?  ?  ?OBJECTIVE IMPAIRMENTS Abnormal gait, decreased activity tolerance, decreased balance, decreased endurance, decreased mobility, difficulty walking, decreased strength, and increased edema.  ?  ?ACTIVITY LIMITATIONS cleaning, community activity, driving, meal prep, occupation, laundry, yard work, and shopping.  ?  ?PERSONAL FACTORS HTN, hypothyroidism, hyperglycemia are also affecting patient's functional outcome.  ?  ?  ?REHAB POTENTIAL: Good ?  ?CLINICAL DECISION MAKING: Evolving/moderate complexity ?  ?EVALUATION COMPLEXITY: Moderate ?  ?  ?GOALS: ?Goals reviewed with patient? Yes ?  ?SHORT TERM GOALS: ?  ?Pt will be independent with strength and balance HEP with supervision from family. ?Baseline: to be initiated at second visit ?Target date: 09/15/2021 ?Goal status: INITIAL ?  ?2.  Pt will decrease 5xSTS to <13 seconds in order to demonstrate  decreased risk for falls and improved functional bilateral LE strength and power. ?Baseline: 15.91 sec ?Target date: 09/15/2021 ?Goal status: INITIAL ?  ?3.  Pt will demonstrate TUG of <16 seconds in order to decrease risk of falls and improve functional mobility using LRAD. ?Baseline: 19.53 seconds ?Target date: 09/15/2021 ?Goal status: INITIAL ?  ?4.  Pt will demonstrate a gait speed of >2 feet/sec at normal pace w/ LRAD in order to decrease risk for falls.  ?Baseline: 1.86 ft/sec w/ RW ?Target date: 09/15/2021 ?Goal status: INITIAL ?  ?LONG TERM GOALS: ?  ?Pt will increase BERG balance score to 46/56 to demonstrate improved static balance. ?Baseline: 43/56 ?Target date: 10/20/2021 ?Goal status: INITIAL ?  ?2.  Pt will ambulate>300' feet on 2MWT to demonstrate improved endurance for functional tasks in home and community. ?Baseline: 2MWT w/ RW: 219' & 2MWT w/o AD: 215' ?Target date: 10/20/2021 ?Goal status: INITIAL ?  ?3.  Pt  will ambulate up eight 6" stairs using unilateral rails w/ step-through pattern. ?Baseline: Pt ambulates up 4 stairs using bilateral rails w/ step-to pattern. ?Target date: 10/20/2021 ?Goal status: INITIAL ?  ?4.  Pt will amb

## 2021-09-01 NOTE — Therapy (Signed)
[facility] ?[facility] ?[place], [place], [address] ?Phone: [phone]   Fax:  [phone] ? ?Occupational Therapy Treatment ? ?Patient Details  ?Name: Denise Wright ?MRN: [id number] ?Date of Birth: Jan 23, 1951 ?Referring Provider (OT): [place] f/u [place] ? ? ?Encounter Date: 09/01/2021 ? ? OT End of Session - 09/01/21 1234   ? ? Visit Number 4   ? Number of Visits 9   ? Date for OT Re-Evaluation 10/06/21   ? Authorization Type BCBS   ? Authorization Time Period VL: 30 PT/OT combined, 30 ST, Auth NOT req'd   ? OT Start Time 1232   ? OT Stop Time 1315   ? OT Time Calculation (min) 43 min   ? Activity Tolerance Patient tolerated treatment well;Patient limited by fatigue   ? Behavior During Therapy [facility] for tasks assessed/performed   ? ?  ?  ? ?  ? ? ?Past Medical History:  ?Diagnosis Date  ? Hyperlipidemia   ? Hypertension   ? Pneumonia   ? Thyroid disease   ? ? ?Past Surgical History:  ?Procedure Laterality Date  ? ABDOMINAL HYSTERECTOMY  1983  ? APPLICATION OF CRANIAL NAVIGATION Right 07/06/2021  ? Procedure: APPLICATION OF CRANIAL NAVIGATION;  Surgeon: Dawley, Theodoro Doing, DO;  Location: [facility];  Service: Neurosurgery;  Laterality: Right;  ? CRANIOTOMY Right 07/06/2021  ? Procedure: CRANIOTOMY TUMOR EXCISION;  Surgeon: Karsten Ro, DO;  Location: [facility];  Service: Neurosurgery;  Laterality: Right;  ? TONSILLECTOMY  1965  ? TOOTH EXTRACTION    ? ? ?There were no vitals filed for this visit. ? ? Subjective Assessment - 09/01/21 1235   ? ? Subjective  "today is last day of week 4 - I am doing more things more independently. I walked from [place] the courthouse without my walker and without resting"   ? Patient is accompanied by: Family member   ? Pertinent History PMH: HTN, HLD, Pneumonia, Thyroid Disease, Active Cancer   ? Limitations GBM with chemotherapy/radiation treatment currently   ? Patient Stated Goals be independent again   ? Currently in  Pain? No/denies   ? Pain Score 0-No pain   ? ?  ?  ? ?  ? ? ? ?Constant Therapy Alternating Symbol level 5 with 92% accuracy and 88.65s average response time. ? ?Environmental Scanning with 7/8 accuracy = 88% on first pass with finding even numbers. Pt required mod/max cues for locating remaining item on second pass. Pt then found 8/8 odd cards on third pass with no cueing. ? ?Tanagram Puzzle completing level 6 and 9 with mod cues for getting started and completing puzzles and determining procedure of novel task. Pt with some attention deficits to details of shape vs matching color only, etc for pieces.  ? ? ? ? ? ? ? ? ? ? ? ? ? ? ? ? ? ? ? ? ? ? ? ? ? OT Short Term Goals - 08/23/21 1106   ? ?  ? OT SHORT TERM GOAL #1  ? Title Pt will be independent with initial HEP for grip strength in LUE   ? Time 4   ? Period Weeks   ? Status Achieved   independent with yellow theraputty  ? Target Date 09/08/21   ? ?  ?  ? ?  ? ? ? ? OT Long Term Goals - 09/01/21 1237   ? ?  ? OT LONG TERM GOAL #1  ? Title  Pt will be independent with updated HEP   ? Time 8   ? Period Weeks   ? Status On-going   ? Target Date 10/06/21   ?  ? OT LONG TERM GOAL #2  ? Title Pt will verbalize understanding of energy conservation strategies   ? Time 8   ? Period Weeks   ? Status On-going   reports doing some of them consistently 09/01/21  ?  ? OT LONG TERM GOAL #3  ? Title Pt complete all basic ADLs with Mod I and good safety awareness with ecxeption of compression stockings.   ? Baseline min A   ? Time 8   ? Period Weeks   ? Status On-going   reports just needing assistance with compression stockings - continue to assess 09/01/21  ?  ? OT LONG TERM GOAL #4  ? Title Pt will perform environmental scanning with cog component with 90% accuracy or greater.   ? Time 8   ? Period Weeks   ? Status On-going   ?  ? OT LONG TERM GOAL #5  ? Title Pt will perform simple warm meal prep and/or light housekeeping with good safety awareness and distant supervision   ?  Time 8   ? Period Weeks   ? Status On-going   ? ?  ?  ? ?  ? ? ? ? ? ? ? ? Plan - 09/01/21 1256   ? ? Clinical Impression Statement Pt reports increased activity tolerance and independence with ADLs. Continuing to progress towards goals and attention.   ? OT Occupational Profile and History Problem Focused Assessment - Including review of records relating to presenting problem   ? Occupational performance deficits (Please refer to evaluation for details): IADL's;ADL's;Leisure   ? Body Structure / Function / Physical Skills ADL;Decreased knowledge of use of DME;Strength;UE functional use;ROM;IADL   ? Cognitive Skills Attention;Safety Awareness;Problem Solve;Energy/Drive   ? Rehab Potential Good   ? Clinical Decision Making Limited treatment options, no task modification necessary   ? Comorbidities Affecting Occupational Performance: None   ? Modification or Assistance to Complete Evaluation  No modification of tasks or assist necessary to complete eval   ? OT Frequency 1x / week   ? OT Duration 8 weeks   ? OT Treatment/Interventions Self-care/ADL training;Functional Mobility Training;Neuromuscular education;Patient/family education;Therapeutic exercise;Cognitive remediation/compensation;Visual/perceptual remediation/compensation;Therapeutic activities;DME and/or AE instruction;Energy conservation   ? Plan vis/cog task,  attention and environmental scanning with cog   ? Consulted and Agree with Plan of Care Patient;Family member/caregiver   ? Family Member Consulted daughter   ? ?  ?  ? ?  ? ? ?Patient will benefit from skilled therapeutic intervention in order to improve the following deficits and impairments:   ?Body Structure / Function / Physical Skills: ADL, Decreased knowledge of use of DME, Strength, UE functional use, ROM, IADL ?Cognitive Skills: Attention, Safety Awareness, Problem Solve, Energy/Drive ?  ? ? ?Visit Diagnosis: ?Attention and concentration deficit ? ?Muscle weakness (generalized) ? ?Other lack  of coordination ? ?Visuospatial deficit ? ?Unsteadiness on feet ? ?Frontal lobe and executive function deficit ? ? ? ?Problem List ?Patient Active Problem List  ? Diagnosis Date Noted  ? Goals of care, counseling/discussion 07/20/2021  ? 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 Brain mass 06/30/2021  ? Hypothyroidism 06/30/2021  ? Hypokalemia 06/30/2021  ? Essential hypertension 05/25/2021  ? Hyperglycemia 07/14/2011  ? Normocytic anemia 07/13/2011  ? ? ?Zachery Conch, OT ?09/01/2021, 12:57 PM ? ?  [facility] ?[facility] ?[place], [place], [address] ?Phone: [phone]   Fax:  [phone] ? ?Name: Denise Wright ?MRN: [id number] ?Date of Birth: Oct 20, 1950 ? ?

## 2021-09-01 NOTE — Therapy (Signed)
[facility] ?[facility] ?[place], [place], [address] ?Phone: [phone]   Fax:  [phone] ? ?Speech Language Pathology Treatment ? ?Patient Details  ?Name: Denise Wright ?MRN: [id number] ?Date of Birth: Jan 08, 1951 ?Referring Provider (SLP): Lauraine Rinne, PA-C ? ? ?Encounter Date: 09/01/2021 ? ? End of Session - 09/01/21 1148   ? ? Visit Number 4   ? Number of Visits 25   ? Date for SLP Re-Evaluation 11/09/21   ? Authorization Type BCBS   ? SLP Start Time 1318   ? SLP Stop Time  1402   ? SLP Time Calculation (min) 44 min   ? Activity Tolerance Patient tolerated treatment well   ? ?  ?  ? ?  ? ? ?Past Medical History:  ?Diagnosis Date  ? Hyperlipidemia   ? Hypertension   ? Pneumonia   ? Thyroid disease   ? ? ?Past Surgical History:  ?Procedure Laterality Date  ? ABDOMINAL HYSTERECTOMY  1983  ? APPLICATION OF CRANIAL NAVIGATION Right 07/06/2021  ? Procedure: APPLICATION OF CRANIAL NAVIGATION;  Surgeon: Dawley, Theodoro Doing, DO;  Location: [facility];  Service: Neurosurgery;  Laterality: Right;  ? CRANIOTOMY Right 07/06/2021  ? Procedure: CRANIOTOMY TUMOR EXCISION;  Surgeon: Karsten Ro, DO;  Location: [facility];  Service: Neurosurgery;  Laterality: Right;  ? TONSILLECTOMY  1965  ? TOOTH EXTRACTION    ? ? ?There were no vitals filed for this visit. ? ? Subjective Assessment - 09/01/21 1319   ? ? Subjective "I'm learning to rest"   ? Patient is accompained by: Family member   ? Currently in Pain? No/denies   ? ?  ?  ? ?  ? ? ? ? ? ? ? ? ADULT SLP TREATMENT - 09/01/21 1148   ? ?  ? General Information  ? Behavior/Cognition Alert;Cooperative;Pleasant mood   ?  ? Treatment Provided  ? Treatment provided Cognitive-Linquistic   ?  ? Cognitive-Linquistic Treatment  ? Treatment focused on Cognition;Patient/family/caregiver education   ? Skilled Treatment Returned with HEP, in which pt accurately completed 2/3 exercises. Mod A required to aid comprehension and problem  solving to correct errors, despite pt's daughter providing instruction at home. SLP re-iterated rationale of task was to address executive functioning skills (problem solving, reasoning) and attention. Pt demonstrated increased awareness of functional implications and applications related to household tasks. Some difficulty with mental flexability exhibited as pt often attempted to self-impose own schedule/rules onto concrete tasks. SLP re-educated focusing on task specifics and reducing distracting/tangential thoughts to aid accuracy. SLP targeted attention to detail task, in which pt noted to miss blank x1. Pt independently noted error and re-trialed but unable to self-correct error despite multiple attempts. SLP educated use of attention strategies to aid accuracy. Pt did not feel strategies were necessary despite errors.   ?  ? Assessment / Recommendations / Plan  ? Plan Continue with current plan of care   ?  ? Progression Toward Goals  ? Progression toward goals Progressing toward goals   ? ?  ?  ? ?  ? ? ? SLP Education - 09/01/21 1418   ? ? Education Details attention/memory strategies, functional application/implications   ? Person(s) Educated Patient;Child(ren)   ? Methods Explanation;Demonstration;Verbal cues;Handout   ? Comprehension Verbalized understanding;Returned demonstration;Verbal cues required;Need further instruction   ? ?  ?  ? ?  ? ? ? SLP Short Term Goals - 09/01/21 1149   ? ?  ? SLP SHORT  TERM GOAL #1  ? Title pt will complete standardized testing   ? Status Achieved   ? Target Date 08/25/21   ?  ? SLP SHORT TERM GOAL #2  ? Title Pt will verbalize 3 attention/memory compensations for making/tracking appointments, managing meds, and other daily tasks in 2 sessions   ? Baseline 08-23-21   ? Time 3   ? Period Weeks   ? Status On-going   ? Target Date 09/08/21   ?  ? SLP SHORT TERM GOAL #3  ? Title pt will verbally generate and carryover (with min A) solutions to problems encountered in everyday  living in 2 sessions   ? Baseline 08-23-21   ? Time 5   ? Period Weeks   ? Status On-going   ? Target Date 09/22/21   ?  ? SLP SHORT TERM GOAL #4  ? Title pt will verbally state errors on mod complex written or verbal tasks with nonverbal cues   ? Baseline 08-23-21   ? Time 5   ? Period Weeks   ? Status On-going   ? Target Date 09/22/21   ? ?  ?  ? ?  ? ? ? SLP Long Term Goals - 09/01/21 1150   ? ?  ? SLP LONG TERM GOAL #1  ? Title Pt will verbalize AND IMPLEMEMENT 3 attention/memory compensations for making/tracking appointments, managing meds, and other daily tasks in 4 sessions   ? Time 6   ? Period Weeks   ? Status On-going   ? Target Date 10/06/21   ?  ? SLP LONG TERM GOAL #2  ? Title Pt will score higher on a patient-reported outcome measure in the last 1-2 visits than when taken in the first 2 sessions   ? Time 10   ? Period Weeks   ? Status On-going   ? Target Date 11/09/21   ?  ? SLP LONG TERM GOAL #3  ? Title pt will verbally generate and independently carryover solutions to problems encountered in everyday living with min A in 4 sessions   ? Time 10   ? Period Weeks   ? Status On-going   ? Target Date 11/09/21   ?  ? SLP LONG TERM GOAL #4  ? Title pt will note verbal or written errors in work-like tasks independently for improving verbal and/or written accuracy   ? Time 10   ? Period Weeks   ? Status On-going   ? Target Date 11/09/21   ? ?  ?  ? ?  ? ? ? Plan - 09/01/21 1149   ? ? 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 Structured tasks revealed reduced attention and executive functioning deficits, which improved with intermittent min to mod A and cued use of generated strategies. Her performance in some of these areas may decr as she progresses through chemo/rad tx due to temporary physiological changes to the brain. She will benefit from  skilled ST targeting these deficits.   ? Speech Therapy Frequency 2x / week   ? Duration 12 weeks   pt may schedule x1/week for 6 weeks due to chemo/rad schedule  ? Treatment/Interventions Cognitive reorganization;Environmental controls;Internal/external aids;Compensatory strategies;Patient/family education;Functional tasks;Cueing hierarchy   ? Potential to Achieve Goals Good   ? Consulted and Agree with Plan of Care Patient   ? ?  ?  ? ?  ? ? ?Patient  will benefit from skilled therapeutic intervention in order to improve the following deficits and impairments:   ?Cognitive communication deficit ? ? ? ?Problem List ?Patient Active Problem List  ? Diagnosis Date Noted  ? Goals of care, counseling/discussion 07/20/2021  ? 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 Brain mass 06/30/2021  ? Hypothyroidism 06/30/2021  ? Hypokalemia 06/30/2021  ? Essential hypertension 05/25/2021  ? Hyperglycemia 07/14/2011  ? Normocytic anemia 07/13/2011  ? ? ?Marzetta Board, CCC-SLP ?09/01/2021, 2:20 PM ? ?[facility] ?[facility] ?[place], [place], [address] ?Phone: [phone]   Fax:  [phone] ? ? ?Name: Coretta Leisey Reimann ?MRN: [id number] ?Date of Birth: 1951-01-31 ? ?

## 2021-09-04 ENCOUNTER — Ambulatory Visit
Admission: RE | Admit: 2021-09-04 | Discharge: 2021-09-04 | Disposition: A | Discharge status: Home / Self Care | Payer: BC Managed Care – PPO | Source: Ambulatory Visit | Attending: Radiation Oncology | Admitting: Radiation Oncology

## 2021-09-04 ENCOUNTER — Inpatient Hospital Stay: Payer: BC Managed Care – PPO | Admitting: Emergency Medicine

## 2021-09-04 ENCOUNTER — Other Ambulatory Visit: Payer: Self-pay

## 2021-09-04 DIAGNOSIS — C719 Malignant neoplasm of brain, unspecified: Secondary | ICD-10-CM

## 2021-09-04 NOTE — Progress Notes (Addendum)
TRIAL [id number] A Single Arm, Pilot Study of Ramipril for Preventing Radiation-Induced Cognitive Decline in Glioblastoma (GBM) Patients Receiving Brain Radiotherapy ?  ?Patient arrives today Accompanied by her daughter  for the end of week 4, beginning week 5 visit.  ?  ? ?LABS: Mandatory labs were collected 08/31/21 per consent and study protocol: Patient Denise Wright tolerated well without complaint. ?  ?MEDICATION REVIEW: Patient reviews and verifies the current medication list is correct. No changes to medications or supplements. Pt has not missed any temozolomide doses. ? ?VITAL SIGNS: Vital signs are collected per study protocol. BP 112/70, right arm, sitting. ? ?MD/PROVIDER VISIT: Patient did not see the provider today. ?  ?ADVERSE EVENTS: Patient Denise Wright reports no new AEs. ? ?ADVERSE EVENT LOG:  ?Denise Wright ?[id number] ? ?09/04/2021 ? ?Adverse Event Log ? ?Study/Protocol: [id number] A Single Arm, Pilot Study of Ramipril for Preventing Radiation-Induced Cognitive Decline in Glioblastoma (GBM) Patients Receiving Brain Radiotherapy ?Cycle: End of week 4, beginning of week 5 ? ?  ?Event Grade Onset Date Resolved Date Drug Name Attribution Treatment Comments  ?Arthralgia (L ankle pain) Grade 1 08/09/21 Ongoing Ramipril Unrelated Sports balm, elevation, epsom salts    ?Arthralgia (R ankle pain) Grade 1 08/13/21 Ongoing Ramipril Unrelated Sports balm, elevation, epsom salts    ?Increased creatinine Grade 1 08/10/21 Ongoing Ramipril Unrelated Decreased ramipril dose    ?Hypoalbuminemia Grade 1 08/24/21 Ongoing Ramipril Unrelated None    ?Hyperglycemia Grade 1 08/17/21 Ongoing Ramipril Unrelated None    ?Edema limbs (bilateral leg swelling) Grade 2 08/15/21 Ongoing Ramipril Unrelated Lasix    ?Fatigue Grade 2 08/27/21 Ongoing Ramipril Unrelated None    ?Hypernatremia Grade 1 08/24/21 Ongoing Ramipril Unrelated None    ? ?MEDICATION DIARIES and IRT: Patient does return medication diaries today. She will complete her March  diary this week; provided her with a labeled April diary. Per study diaries, 6.5 doses were taken for a 100% compliance rate; pt has to take another pill tonight (pt splits the dose into one in the am & one in pm). Bottle #1 (lot [id number]) is returned with 69 remaining tablets. Based on returned medication, 6.5 doses were taken with a compliance rate of 100%. Patient is reminded of the importance of taking study medications as directed verbalizes understanding.  ? ?GIFT CARD: This study does not provide visit compensation.  ? ?DISPOSITION: Upon completion off all study requirements, patient was escorted to elevators.  ? ?The patient was thanked for their time and continued voluntary participation in this study. Patient Denise Wright has been provided direct contact information and is encouraged to contact this Nurse for any needs or questions. Reminded pt of lab appt later this week and confirmed they are able to stay after RT on Monday to meet with Learta Codding, Research RN. ? ?Marjie Skiff. Jadan Hinojos, RN, BSN, CHPN ?She  Her  Hers ?Clinical Research Nurse ?[facility] ?Direct Dial [phone]  Pager [phone] ?09/04/2021 4:32 PM ? ?LATE ENTRY:  ? ?Dr Mickeal Skinner reviewed labs; no results were clinically relevant to the study. Her creatinine is trending down, so she will stay on the current dose of study drug. ? ?Marjie Skiff Denise Carneiro, RN, BSN, CHPN ?She  Her  Hers ?Clinical Research Nurse ?[place] ?Direct Dial [phone]  Pager [phone] ?09/11/2021 2:21 PM ? ?

## 2021-09-05 ENCOUNTER — Ambulatory Visit
Admission: RE | Admit: 2021-09-05 | Discharge: 2021-09-05 | Disposition: A | Discharge status: Home / Self Care | Payer: BC Managed Care – PPO | Source: Ambulatory Visit | Attending: Radiation Oncology | Admitting: Radiation Oncology

## 2021-09-05 ENCOUNTER — Other Ambulatory Visit: Payer: Self-pay

## 2021-09-05 DIAGNOSIS — C719 Malignant neoplasm of brain, unspecified: Secondary | ICD-10-CM | POA: Diagnosis not present

## 2021-09-06 ENCOUNTER — Other Ambulatory Visit (HOSPITAL_COMMUNITY): Payer: Self-pay

## 2021-09-06 ENCOUNTER — Ambulatory Visit
Admission: RE | Admit: 2021-09-06 | Discharge: 2021-09-06 | Disposition: A | Discharge status: Home / Self Care | Payer: BC Managed Care – PPO | Source: Ambulatory Visit | Attending: Radiation Oncology | Admitting: Radiation Oncology

## 2021-09-06 DIAGNOSIS — C719 Malignant neoplasm of brain, unspecified: Secondary | ICD-10-CM | POA: Diagnosis not present

## 2021-09-07 ENCOUNTER — Inpatient Hospital Stay: Payer: BC Managed Care – PPO

## 2021-09-07 ENCOUNTER — Ambulatory Visit
Admission: RE | Admit: 2021-09-07 | Discharge: 2021-09-07 | Disposition: A | Discharge status: Home / Self Care | Payer: BC Managed Care – PPO | Source: Ambulatory Visit | Attending: Radiation Oncology | Admitting: Radiation Oncology

## 2021-09-07 ENCOUNTER — Other Ambulatory Visit: Payer: Self-pay

## 2021-09-07 DIAGNOSIS — C719 Malignant neoplasm of brain, unspecified: Secondary | ICD-10-CM | POA: Diagnosis not present

## 2021-09-07 LAB — RESEARCH LABS

## 2021-09-08 ENCOUNTER — Ambulatory Visit: Payer: BC Managed Care – PPO | Admitting: Occupational Therapy

## 2021-09-08 ENCOUNTER — Encounter: Payer: Self-pay | Admitting: Physical Therapy

## 2021-09-08 ENCOUNTER — Ambulatory Visit: Payer: BC Managed Care – PPO | Admitting: Physical Therapy

## 2021-09-08 ENCOUNTER — Ambulatory Visit
Admission: RE | Admit: 2021-09-08 | Discharge: 2021-09-08 | Disposition: A | Discharge status: Home / Self Care | Payer: BC Managed Care – PPO | Source: Ambulatory Visit | Attending: Radiation Oncology | Admitting: Radiation Oncology

## 2021-09-08 ENCOUNTER — Ambulatory Visit: Payer: BC Managed Care – PPO

## 2021-09-08 DIAGNOSIS — M6281 Muscle weakness (generalized): Secondary | ICD-10-CM

## 2021-09-08 DIAGNOSIS — R278 Other lack of coordination: Secondary | ICD-10-CM

## 2021-09-08 DIAGNOSIS — R2689 Other abnormalities of gait and mobility: Secondary | ICD-10-CM

## 2021-09-08 DIAGNOSIS — R2681 Unsteadiness on feet: Secondary | ICD-10-CM

## 2021-09-08 DIAGNOSIS — R41840 Attention and concentration deficit: Secondary | ICD-10-CM

## 2021-09-08 DIAGNOSIS — C719 Malignant neoplasm of brain, unspecified: Secondary | ICD-10-CM | POA: Diagnosis not present

## 2021-09-08 DIAGNOSIS — R41842 Visuospatial deficit: Secondary | ICD-10-CM

## 2021-09-08 DIAGNOSIS — R41844 Frontal lobe and executive function deficit: Secondary | ICD-10-CM

## 2021-09-08 DIAGNOSIS — R41841 Cognitive communication deficit: Secondary | ICD-10-CM

## 2021-09-08 NOTE — Therapy (Signed)
?OUTPATIENT PHYSICAL THERAPY TREATMENT NOTE ? ? ?Patient Name: Denise Wright ?MRN: [id number] ?DOB:05-19-1951, 71 y.o., female ?Today's Date: 09/08/2021 ? ?PCP: Willey Blade, MD ?REFERRING PROVIDER: Cathlyn Parsons, PA-C ? ? PT End of Session - 09/08/21 1320   ? ? Visit Number 5   ? Number of Visits 15   ? Date for PT Re-Evaluation 10/20/21   ? Progress Note Due on Visit 10   ? PT Start Time 0258   ? PT Stop Time 5277   ? PT Time Calculation (min) 42 min   ? Equipment Utilized During Treatment Gait belt   ? Activity Tolerance Patient tolerated treatment well   ? Behavior During Therapy [facility] for tasks assessed/performed   ? ?  ?  ? ?  ? ? ? ?Past Medical History:  ?Diagnosis Date  ? Hyperlipidemia   ? Hypertension   ? Pneumonia   ? Thyroid disease   ? ?Past Surgical History:  ?Procedure Laterality Date  ? ABDOMINAL HYSTERECTOMY  1983  ? APPLICATION OF CRANIAL NAVIGATION Right 07/06/2021  ? Procedure: APPLICATION OF CRANIAL NAVIGATION;  Surgeon: Dawley, Theodoro Doing, DO;  Location: [place];  Service: Neurosurgery;  Laterality: Right;  ? CRANIOTOMY Right 07/06/2021  ? Procedure: CRANIOTOMY TUMOR EXCISION;  Surgeon: Karsten Ro, DO;  Location: [place];  Service: Neurosurgery;  Laterality: Right;  ? TONSILLECTOMY  1965  ? TOOTH EXTRACTION    ? ?Patient Active Problem List  ? Diagnosis Date Noted  ? Goals of care, counseling/discussion 07/20/2021  ? 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[place]) 07/12/2021  ? Brain mass 06/30/2021  ? Hypothyroidism 06/30/2021  ? Hypokalemia 06/30/2021  ? Essential hypertension 05/25/2021  ? Hyperglycemia 07/14/2011  ? Normocytic anemia 07/13/2011  ? ? ?REFERRING DIAG: D49.6 (ICD-10-CM) - Brain tumor ([place])  ? ?THERAPY DIAG:  ?Other abnormalities of gait and mobility ? ?Muscle weakness (generalized) ? ?Unsteadiness on feet ? ?Other lack of coordination ? ?PERTINENT HISTORY: HTN, hypothyroidism, hyperglycemia, normocytic anemia, glioblastoma with isocitrate dehydrogenase gene  wildtype ? ?PRECAUTIONS: Fall ? ?SUBJECTIVE: She states she is tired due to having radiation this morning instead of afternoon.  No nausea or pain.  She has been walking w/o AD outside of clinic in unfamiliar environments including busy restaurant environments w/o incident. ? ?PAIN:  ?Are you having pain? No ? ?TODAY'S TREATMENT: ?Pt ambulates 302' on varied surfaces in gym using blue and red mats and inclined surface w/ CGA to SBA no AD, cued for relaxed LUE and inc arm swing.  Ambulates w/ dec speed. ?Pt stands at countertop on airex with wide BOS performing bimanual task of unstacking cones onto countertop>retrieving cones with L hand to R of midline and placing far to L of low shelf to facilitate weight shifting outside BOS and L attention and scanning.  Pt standing on airex with narrow BOS retrieving cones from low shelf w/ RUE across midline to L returning to countertop surface before performing bimanual re-stacking task with cones. ?Pt in // bars:  standing on rocker board oriented laterally with dec ability to find center of mass requiring modA to remain upright.  Pt uses UE to find center and initiate weight shifting L and R with cues and pelvic facilitation to tap board to ground on each side and engage hips to initiate weight shift instead of lateral trunk lean.  Dec to unilateral RUE support during weight shifting and no UE support CGA for maintaining board level using visual feedback from mirror.  Progressing to  mini squats using BUE support, light CGA, and verbal cues for initial sequencing and pacing of task. ?Pt performs4x 6" stairs using bil rails and step to using alt LE leading during ascent and step to with LLE leading during descent on first attempt, SBA.  During second attempt pt cued for reciprocal step through using R rail only, CGA.  Focused on eccentric engagement on descent w/ bil rails and reciprocal step through for functional LLE strength requiring CGA-MinA  ?Eccentric heel taps on  bottom step x4, MinA to return to stair, pt w/ difficulty just taping heel to ground, tendency to place and use momentum to push off ground.  Inc time to complete task. ? ? ?PATIENT EDUCATION: ?Education details: Continued edu on walking at home for activity tolerance with family member for safety. ?Person educated: Patient, daughter ?Education method: Explanation ?Education comprehension: verbalized understanding ? ?HEP: ?Access Code: [email or web address] ?Initiated walking program 09/01/2021 with supervision in home environment/neighborhood on level paved surface w/ or w/o AD for endurance. ? ?OBJECTIVE:  ?  ?DIAGNOSTIC FINDINGS: Pt had MRI prior to surgical resection of tumor ?  ?COGNITION: ?Overall cognitive status: Within functional limits for tasks assessed ?  ?            ?SENSATION: ?Light touch: Appears intact ?Hot/Cold: Appears intact-pt reports no difficulty noting difference in shower and when washing hands ?  ?COORDINATION: ?Fingertip opposition: normal bilaterally ?Heel-to-shin:  normal bilaterally; inc time ?  ?EDEMA:  ?Mild bilateral LEs-pt states she has had this for a while now. ?  ?MUSCLE TONE: None noted in LE ?  ?POSTURE: rounded shoulders, forward head, and increased thoracic kyphosis ?  ?AROM/PROM:   ?  ?AROM Right ?08/11/2021 Left ?08/11/2021  ?Hip flexion [facility] WFL  ?Hip extension " "  ?Hip abduction " "  ?Hip adduction " "  ?Hip internal rotation " "  ?Hip external rotation " "  ?Knee flexion " "  ?Knee extension " "  ?Ankle dorsiflexion " "  ?Ankle plantarflexion " "  ? (Blank rows = not tested) ?MMT: ?Left shoulder 4-/5 ?MMT Right ?08/11/2021 Left ?08/11/2021  ?Hip flexion 4/5 3/5  ?Hip extension      ?Hip abduction 4/5 4/5  ?Hip adduction 5/5 5/5  ?Hip internal rotation      ?Hip external rotation      ?Knee flexion 4-/5 3+/5  ?Knee extension 4+/5 4+/5  ?Ankle dorsiflexion 5/5 4+/5; no pain  ?Ankle plantarflexion      ?Ankle inversion      ?Ankle eversion      ?(Blank rows = not tested) ?   ?TRANSFERS: ?Assistive device utilized: Environmental consultant - 2 wheeled  ?Sit to stand: Modified independence ?Stand to sit: Modified independence ?  ?STAIRS: 6", 4 step up using bilateral rails, step-to pattern, up with LLE, down with RLE.  Edu on safe stair negotiation, "up with the good, down with the bad" to prevent falls on steps into home.   ?  ?GAIT: ?Gait pattern: step to pattern, decreased stride length, and trunk flexed ?Distance walked: Several bouts in and around gym for balance and fall risk assessments. ?Assistive device utilized: Environmental consultant - 2 wheeled ?Level of assistance: SBA ?  ?FUNCTIONAL TESTs:  ?5 times sit to stand: 15.91 sec ?Timed up and go (TUG): 19.53 sec w/ RW ?10 meter walk test: (normal)=0.56 m/sec or 1.80f/sec; (Fast)=.77 m/sec or 2.56 ft/sec  w/ RW  ?  ?ASSESSMENT: ?  ?CLINICAL IMPRESSION: ?Focused session on gait training over varied surfaces indoors  due to weather.  Pt demonstrates improved activity tolerance throughout session remaining limited by dec gait speed, LLE weakness, and mild L inattention.  Progressed session to higher level balance training to address deficits.  Continue per POC. ?  ?  ?  ?OBJECTIVE IMPAIRMENTS Abnormal gait, decreased activity tolerance, decreased balance, decreased endurance, decreased mobility, difficulty walking, decreased strength, and increased edema.  ?  ?ACTIVITY LIMITATIONS cleaning, community activity, driving, meal prep, occupation, laundry, yard work, and shopping.  ?  ?PERSONAL FACTORS HTN, hypothyroidism, hyperglycemia are also affecting patient's functional outcome.  ?  ?  ?REHAB POTENTIAL: Good ?  ?CLINICAL DECISION MAKING: Evolving/moderate complexity ?  ?EVALUATION COMPLEXITY: Moderate ?  ?  ?GOALS: ?Goals reviewed with patient? Yes ?  ?SHORT TERM GOALS: ?  ?Pt will be independent with strength and balance HEP with supervision from family. ?Baseline: to be initiated at second visit ?Target date: 09/15/2021 ?Goal status: INITIAL ?  ?2.  Pt will decrease  5xSTS to <13 seconds in order to demonstrate decreased risk for falls and improved functional bilateral LE strength and power. ?Baseline: 15.91 sec ?Target date: 09/15/2021 ?Goal status: INITIAL ?  ?3.  Pt will demonstrate TUG o

## 2021-09-08 NOTE — Patient Instructions (Addendum)
Finish that last page of Speech Therapy homework.  ? ?I would like you to incorporate reading a novel ~10-20 minutes at least one time a day. This is a good way to work your brain! We can discuss the book next time you come  ? ?Write/plan out a schedule to include your therapy exercises and bring back this schedule to next therapy visit.  ? ? ? ? ? ? ?

## 2021-09-08 NOTE — Therapy (Signed)
?OUTPATIENT OCCUPATIONAL THERAPY TREATMENT NOTE ? ? ?Patient Name: Denise Wright ?MRN: [id number] ?DOB:[birth year], 71 y.o., female ?Today's Date: 09/08/2021 ? ?PCP: Willey Blade, MD ?REFERRING PROVIDER: Gallia with f/u with [place] ? ? OT End of Session - 09/08/21 1233   ? ? Visit Number 5   ? Number of Visits 9   ? Date for OT Re-Evaluation 10/06/21   ? Authorization Type BCBS   ? Authorization Time Period VL: 30 PT/OT combined, 30 ST, Auth NOT req'd   ? OT Start Time 1231   ? OT Stop Time 1315   ? OT Time Calculation (min) 44 min   ? Activity Tolerance Patient tolerated treatment well;Patient limited by fatigue   ? Behavior During Therapy [facility] for tasks assessed/performed   ? ?  ?  ? ?  ? ? ?Past Medical History:  ?Diagnosis Date  ? Hyperlipidemia   ? Hypertension   ? Pneumonia   ? Thyroid disease   ? ?Past Surgical History:  ?Procedure Laterality Date  ? ABDOMINAL HYSTERECTOMY  1983  ? APPLICATION OF CRANIAL NAVIGATION Right 07/06/2021  ? Procedure: APPLICATION OF CRANIAL NAVIGATION;  Surgeon: Dawley, Theodoro Doing, DO;  Location: [place];  Service: Neurosurgery;  Laterality: Right;  ? CRANIOTOMY Right 07/06/2021  ? Procedure: CRANIOTOMY TUMOR EXCISION;  Surgeon: Karsten Ro, DO;  Location: [place];  Service: Neurosurgery;  Laterality: Right;  ? TONSILLECTOMY  1965  ? TOOTH EXTRACTION    ? ?Patient Active Problem List  ? Diagnosis Date Noted  ? Goals of care, counseling/discussion 07/20/2021  ? 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 Brain mass 06/30/2021  ? Hypothyroidism 06/30/2021  ? Hypokalemia 06/30/2021  ? Essential hypertension 05/25/2021  ? Hyperglycemia 07/14/2011  ? Normocytic anemia 07/13/2011  ? ? ?ONSET DATE: 07/06/21 ? ?REFERRING DIAG: GBM ? ?THERAPY DIAG:  ?Other abnormalities of gait and mobility ? ?Muscle weakness (generalized) ? ?Other lack of coordination ? ?Visuospatial deficit ? ?Attention and concentration deficit ? ?Unsteadiness on feet ? ?Frontal lobe and  executive function deficit ? ? ?PERTINENT HISTORY: PMH: HTN, HLD, Pneumonia, Thyroid Disease, Active Cancer  ? ?PRECAUTIONS: GBM with chemotherapy/radiation treatment currently  ? ?SUBJECTIVE: "They changed my radiation appt so I am coming to you from radiation." ? ?PAIN:  ?Are you having pain? No ? ? ? ? ?OBJECTIVE:  ? ?TODAY'S TREATMENT:  ?09/08/21 ?Perfection cues for scanning in organized fashion and req'd increased time for locating correct pieces for spatial organization of pieces.  ? ?36 pc puzzle (bike) with increased time and max cues for attending to details, straight edges, organization of pieces and completing puzzle. Some slight inattention to LUE noted at beginning of the task. Pt req'd cues for attending to left side of table for pieces. Pt req'd max cues for orientation and putting part to whole for puzzle.  ? ? ? ? ? OT Short Term Goals - 08/23/21 1106   ? ?  ? OT SHORT TERM GOAL #1  ? Title Pt will be independent with initial HEP for grip strength in LUE   ? Time 4   ? Period Weeks   ? Status Achieved   independent with yellow theraputty  ? Target Date 09/08/21   ? ?  ?  ? ?  ? ? ? OT Long Term Goals - 09/01/21 1237   ? ?  ? OT LONG TERM GOAL #1  ? Title Pt will be independent with updated HEP   ? Time 8   ?  Period Weeks   ? Status On-going   ? Target Date 10/06/21   ?  ? OT LONG TERM GOAL #2  ? Title Pt will verbalize understanding of energy conservation strategies   ? Time 8   ? Period Weeks   ? Status On-going   reports doing some of them consistently 09/01/21  ?  ? OT LONG TERM GOAL #3  ? Title Pt complete all basic ADLs with Mod I and good safety awareness with ecxeption of compression stockings.   ? Baseline min A   ? Time 8   ? Period Weeks   ? Status On-going   reports just needing assistance with compression stockings - continue to assess 09/01/21  ?  ? OT LONG TERM GOAL #4  ? Title Pt will perform environmental scanning with cog component with 90% accuracy or greater.   ? Time 8   ? Period  Weeks   ? Status On-going   ?  ? OT LONG TERM GOAL #5  ? Title Pt will perform simple warm meal prep and/or light housekeeping with good safety awareness and distant supervision   ? Time 8   ? Period Weeks   ? Status On-going   ? ?  ?  ? ?  ? ? ? Plan - 09/08/21 1233   ? ? Clinical Impression Statement Pt had radiation today and reports being more tired.   ? OT Occupational Profile and History Problem Focused Assessment - Including review of records relating to presenting problem   ? Occupational performance deficits (Please refer to evaluation for details): IADL's;ADL's;Leisure   ? Body Structure / Function / Physical Skills ADL;Decreased knowledge of use of DME;Strength;UE functional use;ROM;IADL   ? Cognitive Skills Attention;Safety Awareness;Problem Solve;Energy/Drive   ? Rehab Potential Good   ? Clinical Decision Making Limited treatment options, no task modification necessary   ? Comorbidities Affecting Occupational Performance: None   ? Modification or Assistance to Complete Evaluation  No modification of tasks or assist necessary to complete eval   ? OT Frequency 1x / week   ? OT Duration 8 weeks   ? OT Treatment/Interventions Self-care/ADL training;Functional Mobility Training;Neuromuscular education;Patient/family education;Therapeutic exercise;Cognitive remediation/compensation;Visual/perceptual remediation/compensation;Therapeutic activities;DME and/or AE instruction;Energy conservation   ? Plan vis/cog task,  attention and environmental scanning with cog   ? Consulted and Agree with Plan of Care Patient;Family member/caregiver   ? Family Member Consulted daughter   ? ?  ?  ? ?  ? ? ? ?Zachery Conch, OT ?09/08/2021, 12:54 PM ? ?  ? ?  ?

## 2021-09-08 NOTE — Therapy (Signed)
?[facility] ?[place], [place], [address] ?Phone: [phone]   Fax:  [phone] ? ?Speech Language Pathology Treatment ? ?Patient Details  ?Name: Denise Wright ?MRN: [id number] ?Date of Birth: [birth year] ?Referring Provider (SLP): Lauraine Rinne, PA-C ? ? ?Encounter Date: 09/08/2021 ? ? End of Session - 09/08/21 1402   ? ? Visit Number 5   ? Number of Visits 25   ? Date for SLP Re-Evaluation 11/09/21   ? Authorization Type BCBS   ? SLP Start Time 1403   ? SLP Stop Time  1445   ? SLP Time Calculation (min) 42 min   ? Activity Tolerance Patient limited by lethargy;Patient limited by fatigue   ? ?  ?  ? ?  ? ? ?Past Medical History:  ?Diagnosis Date  ? Hyperlipidemia   ? Hypertension   ? Pneumonia   ? Thyroid disease   ? ? ?Past Surgical History:  ?Procedure Laterality Date  ? ABDOMINAL HYSTERECTOMY  1983  ? APPLICATION OF CRANIAL NAVIGATION Right 07/06/2021  ? Procedure: APPLICATION OF CRANIAL NAVIGATION;  Surgeon: Dawley, Theodoro Doing, DO;  Location: [place];  Service: Neurosurgery;  Laterality: Right;  ? CRANIOTOMY Right 07/06/2021  ? Procedure: CRANIOTOMY TUMOR EXCISION;  Surgeon: Karsten Ro, DO;  Location: [place];  Service: Neurosurgery;  Laterality: Right;  ? TONSILLECTOMY  1965  ? TOOTH EXTRACTION    ? ? ?There were no vitals filed for this visit. ? ? Subjective Assessment - 09/08/21 1404   ? ? Subjective "I am tired. I finished my 4th week (daughter corrected: 5th week) of radiation"   ? Patient is accompained by: Family member   ? Currently in Pain? No/denies   ? ?  ?  ? ?  ? ? ? ? ? ? ? ? ADULT SLP TREATMENT - 09/08/21 1402   ? ?  ? General Information  ? Behavior/Cognition Alert;Cooperative;Pleasant mood;Lethargic   ?  ? Treatment Provided  ? Treatment provided Cognitive-Linquistic   ?  ? Cognitive-Linquistic Treatment  ? Treatment focused on Cognition;Patient/family/caregiver education   ? Skilled Treatment Pt noted with increased  lethargy and fatigue today secondary to receiving CA tx this morning as well as PT/OT. SLP reviewed HEP, in which 1/2 tasks completed. Errors x3 ID'd. Min to mod A required to problem solve and attention to specific details for improved accuracy. SLP provided education and recommedations to aid cognitive engagement and functioning at home (handout provided). Increased processing time required today due to fatigue.   ?  ? Assessment / Recommendations / Plan  ? Plan Continue with current plan of care   ?  ? Progression Toward Goals  ? Progression toward goals Progressing toward goals   ? ?  ?  ? ?  ? ? ? SLP Education - 09/08/21 1516   ? ? Education Details functional cognitive tasks to implement at home   ? Person(s) Educated Patient;Child(ren)   ? Methods Explanation;Demonstration;Verbal cues;Handout   ? Comprehension Verbalized understanding;Returned demonstration;Need further instruction   ? ?  ?  ? ?  ? ? ? SLP Short Term Goals - 09/08/21 1403   ? ?  ? SLP SHORT TERM GOAL #1  ? Title pt will complete standardized testing   ? Status Achieved   ? Target Date 08/25/21   ?  ? SLP SHORT TERM GOAL #2  ? Title Pt will verbalize 3 attention/memory compensations for making/tracking appointments, managing meds, and other daily tasks in  2 sessions   ? Baseline 08-23-21   ? Time 2   ? Period Weeks   ? Status On-going   ? Target Date 09/08/21   ?  ? SLP SHORT TERM GOAL #3  ? Title pt will verbally generate and carryover (with min A) solutions to problems encountered in everyday living in 2 sessions   ? Baseline 08-23-21   ? Time 4   ? Period Weeks   ? Status On-going   ? Target Date 09/22/21   ?  ? SLP SHORT TERM GOAL #4  ? Title pt will verbally state errors on mod complex written or verbal tasks with nonverbal cues   ? Baseline 08-23-21   ? Time 4   ? Period Weeks   ? Status On-going   ? Target Date 09/22/21   ? ?  ?  ? ?  ? ? ? SLP Long Term Goals - 09/08/21 1403   ? ?  ? SLP LONG TERM GOAL #1  ? Title Pt will verbalize AND  IMPLEMEMENT 3 attention/memory compensations for making/tracking appointments, managing meds, and other daily tasks in 4 sessions   ? Time 5   ? Period Weeks   ? Status On-going   ?  ? SLP LONG TERM GOAL #2  ? Title Pt will score higher on a patient-reported outcome measure in the last 1-2 visits than when taken in the first 2 sessions   ? Time 9   ? Period Weeks   ? Status On-going   ?  ? SLP LONG TERM GOAL #3  ? Title pt will verbally generate and independently carryover solutions to problems encountered in everyday living with min A in 4 sessions   ? Time 9   ? Period Weeks   ? Status On-going   ?  ? SLP LONG TERM GOAL #4  ? Title pt will note verbal or written errors in work-like tasks independently for improving verbal and/or written accuracy   ? Time 9   ? Period Weeks   ? Status On-going   ? ?  ?  ? ?  ? ? ? Plan - 09/08/21 1402   ? ? 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 Structured tasks revealed reduced attention and executive functioning deficits, which improved with intermittent min to mod A and cued use of generated strategies. Her performance in some of these areas may decr as she progresses through chemo/rad tx due to temporary physiological changes to the brain. She will benefit from skilled ST targeting these deficits.   ? Speech Therapy Frequency 2x / week   ? Duration 12 weeks   pt may schedule x1/week for 6 weeks due to chemo/rad schedule  ? Treatment/Interventions Cognitive reorganization;Environmental controls;Internal/external aids;Compensatory strategies;Patient/family education;Functional tasks;Cueing hierarchy   ? Potential to Achieve Goals Good   ? Consulted and Agree with Plan of Care Patient   ? ?  ?  ? ?  ? ? ?Patient will benefit from skilled therapeutic intervention in order to improve the following deficits and  impairments:   ?Cognitive communication deficit ? ? ? ?Problem List ?Patient Active Problem List  ? Diagnosis Date Noted  ? Goals of care, counseling/discussion 07/20/2021  ? 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 Brain mass 06/30/2021  ? Hypothyroidism 06/30/2021  ? Hypokalemia 06/30/2021  ? Essential hypertension 05/25/2021  ? Hyperglycemia 07/14/2011  ?  Normocytic anemia 07/13/2011  ? ? ?Marzetta Board, CCC-SLP ?09/08/2021, 3:17 PM ? ?[facility] ?[facility] ?[place], [place], [address] ?Phone: [phone]   Fax:  [phone] ? ? ?Name: Denise Wright ?MRN: [id number] ?Date of Birth: [birth year] ? ?

## 2021-09-11 ENCOUNTER — Inpatient Hospital Stay: Payer: BC Managed Care – PPO | Attending: Internal Medicine | Admitting: Emergency Medicine

## 2021-09-11 ENCOUNTER — Other Ambulatory Visit: Payer: Self-pay

## 2021-09-11 ENCOUNTER — Ambulatory Visit
Admission: RE | Admit: 2021-09-11 | Discharge: 2021-09-11 | Disposition: A | Discharge status: Home / Self Care | Payer: BC Managed Care – PPO | Source: Ambulatory Visit | Attending: Radiation Oncology | Admitting: Radiation Oncology

## 2021-09-11 DIAGNOSIS — R600 Localized edema: Secondary | ICD-10-CM | POA: Insufficient documentation

## 2021-09-11 DIAGNOSIS — C719 Malignant neoplasm of brain, unspecified: Secondary | ICD-10-CM

## 2021-09-11 DIAGNOSIS — I10 Essential (primary) hypertension: Secondary | ICD-10-CM | POA: Insufficient documentation

## 2021-09-11 DIAGNOSIS — Z510 Encounter for antineoplastic radiation therapy: Secondary | ICD-10-CM | POA: Insufficient documentation

## 2021-09-11 DIAGNOSIS — Z806 Family history of leukemia: Secondary | ICD-10-CM | POA: Insufficient documentation

## 2021-09-11 DIAGNOSIS — C712 Malignant neoplasm of temporal lobe: Secondary | ICD-10-CM | POA: Diagnosis present

## 2021-09-11 DIAGNOSIS — Z90710 Acquired absence of both cervix and uterus: Secondary | ICD-10-CM | POA: Insufficient documentation

## 2021-09-11 NOTE — Research (Signed)
[id number] A Single Arm, Pilot Study of Ramipril for Preventing Radiation-Induced Cognitive Decline in Glioblastoma (GBM) Patients Receiving Brain Radiotherapy ?  ?WEEK 5 TITRATION (Week 6 Tx Schedule) ?  ?Patient arrives today (09/11/21) Accompanied by daughter [name] and other family for the Week 5 Titration visit visit.  ?  ?LABS: ?Mandatory labs are collected per consent and study protocol on 09/07/21.  Confirmed that patient took 4 doses of Ramipril before labs were drawn.  Patient Denise Wright tolerated well without complaint.  Abnormal labs reviewed in AE section below. ?  ?MEDICATION REVIEW: ?Patient reviews and verifies the current medication list is correct.  Patient denies any missed doses of Temodar.  Confirmed that patient has only been taking 2 Ramipril pills daily as prescribed (4 pills daily listed as original treatment plan but patient did not tolerate and had to go to lower dose).  Reported changes were recorded on the medication list and marked as reviewed. All other listed medications remain the same. ?  ?VITAL SIGNS: ?Vital signs are collected per study protocol.  BP today 105/55. ?  ?ADVERSE EVENTS:  All graded using CTCAE v. 5.0 ?Patient Denise Wright reports ongoing bilateral ankle pain/arthralgia (Grade 1 for both) with no change, remains unrelated to study drug, no action or reporting required at this time. ?The patient also reports ongoing bilateral leg swelling/edema limbs (Grade 2) with no change, remains unrelated to study drug, no action or reporting required at this time. ?CMP done on 09/07/21 shows the following: ?Increased creatinine (Grade 1) of 1.29 which is outside of study limits (0.76-1.27) and will require patient to come off agent.  Per MD Vaslow still unrelated to study drug, no reporting or action required at this time. ?eGFR is decreased at 45 ml/min but does not meet requirements for Chronic kidney disease, per MD Vaslow this value is clinically insignificant at this time  and unrelated to study drug.  GFR has not decreased by or more than 20% from previous week's value. ?Hyperglycemia has resolved. ?  ?ADVERSE EVENT LOG:  ?Denise Wright ?[id number] ?[id number] ?  ?09/11/2021 ?  ?Adverse Event Log ?  ?Study/Protocol: WF 1801 ?Cycle: Week 5 Titration ?  ?Event Grade Onset Date Resolved Date Drug Name Attribution Treatment Comments  ?Arthralgia (L ankle pain) Grade 1 08/09/21 Ongoing Ramipril Unrelated Sports balm, elevation, epsom salts    ?Arthralgia (R ankle pain) Grade 1 08/13/21 Ongoing Ramipril Unrelated Sports balm, elevation, epsom salts    ?Increased creatinine Grade 1 08/10/21 Ongoing Ramipril Unrelated None    ?Hyperglycemia Grade 1 08/17/21 09/07/21 ?Resolved Ramipril Unrelated None    ?Edema limbs (bilateral leg swelling) Grade 2 08/15/21 Ongoing Ramipril Unrelated Lasix    ?  ?  ?MEDICATION DIARIES and IRT: ?Patient returned weekly medication diaries today.  Per study diaries, 7 doses were taken for a 100% compliance rate. Bottle #1 (Lot [id number]) is returned with 56 remaining tablets. Confirmed pill count with PharmD Raul Del.  Based on returned medication, 7 doses were taken with a compliance rate of 100%.  Due to the increasing creatinine level that is out of protocol range the patient will need to discontinue their study drug completely.  They cannot decrease their dose any further than 2 pills daily (current dose) so they will go Off Agent.  Discussed this with patient who verbalized understanding and states she wishes to continue on study at this time.  Bottle was given to pharmacy for destruction/return per protocol.  Patient does not have  any more study medication at home.  Patient signed all drug diaries and returned them to Research nurse. ?  ?DISPOSITION: ?Upon completion off all study requirements, patient was escorted to exit with family and belongings.  ?  ?The patient was thanked for their time and continued voluntary participation in this study. Patient Denise Currin  Wright has been provided direct contact information and is encouraged to contact this Nurse for any needs or questions. ? ?Denise Guiles 'Learta Codding' Jinx Gilden, RN, BSN ?Clinical Research Nurse I ?09/12/21 ?9:41 AM ? ?

## 2021-09-12 ENCOUNTER — Encounter: Payer: Self-pay | Admitting: Internal Medicine

## 2021-09-12 ENCOUNTER — Ambulatory Visit
Admission: RE | Admit: 2021-09-12 | Discharge: 2021-09-12 | Disposition: A | Discharge status: Home / Self Care | Payer: BC Managed Care – PPO | Source: Ambulatory Visit | Attending: Radiation Oncology | Admitting: Radiation Oncology

## 2021-09-12 DIAGNOSIS — Z510 Encounter for antineoplastic radiation therapy: Secondary | ICD-10-CM | POA: Diagnosis not present

## 2021-09-13 ENCOUNTER — Ambulatory Visit
Admission: RE | Admit: 2021-09-13 | Discharge: 2021-09-13 | Disposition: A | Discharge status: Home / Self Care | Payer: BC Managed Care – PPO | Source: Ambulatory Visit | Attending: Radiation Oncology | Admitting: Radiation Oncology

## 2021-09-13 ENCOUNTER — Other Ambulatory Visit: Payer: Self-pay | Admitting: Emergency Medicine

## 2021-09-13 DIAGNOSIS — Z510 Encounter for antineoplastic radiation therapy: Secondary | ICD-10-CM | POA: Diagnosis not present

## 2021-09-14 ENCOUNTER — Inpatient Hospital Stay: Payer: BC Managed Care – PPO

## 2021-09-14 ENCOUNTER — Ambulatory Visit
Admission: RE | Admit: 2021-09-14 | Discharge: 2021-09-14 | Disposition: A | Discharge status: Home / Self Care | Payer: BC Managed Care – PPO | Source: Ambulatory Visit | Attending: Radiation Oncology | Admitting: Radiation Oncology

## 2021-09-14 ENCOUNTER — Telehealth: Payer: Self-pay | Admitting: Emergency Medicine

## 2021-09-14 ENCOUNTER — Inpatient Hospital Stay (HOSPITAL_BASED_OUTPATIENT_CLINIC_OR_DEPARTMENT_OTHER): Payer: BC Managed Care – PPO | Admitting: Internal Medicine

## 2021-09-14 ENCOUNTER — Other Ambulatory Visit: Payer: Self-pay

## 2021-09-14 VITALS — BP 108/66 | HR 92 | Temp 97.9°F | Resp 18 | Wt 213.4 lb

## 2021-09-14 DIAGNOSIS — Z510 Encounter for antineoplastic radiation therapy: Secondary | ICD-10-CM | POA: Diagnosis not present

## 2021-09-14 DIAGNOSIS — C719 Malignant neoplasm of brain, unspecified: Secondary | ICD-10-CM | POA: Diagnosis not present

## 2021-09-14 LAB — CBC WITH DIFFERENTIAL (CANCER CENTER ONLY)
Abs Immature Granulocytes: 0.01 10*3/uL (ref 0.00–0.07)
Basophils Absolute: 0 10*3/uL (ref 0.0–0.1)
Basophils Relative: 1 %
Eosinophils Absolute: 0.6 10*3/uL — ABNORMAL HIGH (ref 0.0–0.5)
Eosinophils Relative: 16 %
HCT: 31.2 % — ABNORMAL LOW (ref 36.0–46.0)
Hemoglobin: 10.1 g/dL — ABNORMAL LOW (ref 12.0–15.0)
Immature Granulocytes: 0 %
Lymphocytes Relative: 7 %
Lymphs Abs: 0.3 10*3/uL — ABNORMAL LOW (ref 0.7–4.0)
MCH: 30.8 pg (ref 26.0–34.0)
MCHC: 32.4 g/dL (ref 30.0–36.0)
MCV: 95.1 fL (ref 80.0–100.0)
Monocytes Absolute: 0.6 10*3/uL (ref 0.1–1.0)
Monocytes Relative: 15 %
Neutro Abs: 2.3 10*3/uL (ref 1.7–7.7)
Neutrophils Relative %: 61 %
Platelet Count: 174 10*3/uL (ref 150–400)
RBC: 3.28 MIL/uL — ABNORMAL LOW (ref 3.87–5.11)
RDW: 14.4 % (ref 11.5–15.5)
WBC Count: 3.8 10*3/uL — ABNORMAL LOW (ref 4.0–10.5)
nRBC: 0 % (ref 0.0–0.2)

## 2021-09-14 LAB — CMP (CANCER CENTER ONLY)
ALT: 13 U/L (ref 0–44)
AST: 17 U/L (ref 15–41)
Albumin: 3.6 g/dL (ref 3.5–5.0)
Alkaline Phosphatase: 81 U/L (ref 38–126)
Anion gap: 8 (ref 5–15)
BUN: 23 mg/dL (ref 8–23)
CO2: 31 mmol/L (ref 22–32)
Calcium: 8.9 mg/dL (ref 8.9–10.3)
Chloride: 103 mmol/L (ref 98–111)
Creatinine: 1.67 mg/dL — ABNORMAL HIGH (ref 0.44–1.00)
GFR, Estimated: 33 mL/min — ABNORMAL LOW (ref >60)
Glucose, Bld: 93 mg/dL (ref 70–99)
Potassium: 3.5 mmol/L (ref 3.5–5.1)
Sodium: 142 mmol/L (ref 135–145)
Total Bilirubin: 0.5 mg/dL (ref 0.3–1.2)
Total Protein: 6.8 g/dL (ref 6.5–8.1)

## 2021-09-14 MED ORDER — FUROSEMIDE 40 MG PO TABS: 40.0000 mg | ORAL_TABLET | Freq: Every day | ORAL | 1 refills | Status: DC

## 2021-09-14 NOTE — Telephone Encounter (Signed)
[id number] A Single Arm, Pilot Study of Ramipril for Preventing Radiation-Induced Cognitive Decline in Glioblastoma (GBM) Patients Receiving Brain Radiotherapy ? ?Spoke with patient/daughter in person to arrange Week 6 Research appt time on 09/18/21, pt agreed to 2 pm.  Pt aware to call before then with any questions/concerns.  Appt created. ? ?Wells Guiles 'Learta Codding' Tinya Cadogan, RN, BSN ?Clinical Research Nurse I ?09/14/21 ?4:36 PM ? ?

## 2021-09-14 NOTE — Progress Notes (Signed)
? ?[facility] [address]  ?[place], [place] [address] ?[phone] ? ? ?Interval Evaluation ? ?Date of Service: 09/14/21 ?Patient Name: Denise Wright ?Patient MRN: [id number] ?Patient DOB: June 08, 1951 ?Provider: Ventura Sellers, MD ? ?Identifying Statement:  ?Denise Wright is a 71 y.o. female with right temporal glioblastoma  ? ?Oncologic History: ?Oncology History  ?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[facility])  ?07/06/2021 Surgery  ? Right temporal craniotomy, resection with Dr. Reatha Armour; path is glioblastoma IDH-1wt ?  ?07/12/2021 Initial Diagnosis  ? 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[facility]) ?  ?08/07/2021 -  Chemotherapy  ? Patient is on Treatment Plan : BRAIN GLIOBLASTOMA Radiation Therapy With Concurrent Temozolomide 75 mg/m2 Daily Followed By Sequential Maintenance Temozolomide x 6-12 cycles  ?   ? ? ?Biomarkers: ? ?MGMT Unknown.  ?IDH 1/2 Wild type.  ?EGFR Unknown  ?TERT Unknown  ? ?Interval History: ?Denise Wright presents today for follow up, now in final week of radiation and Temodar.  She reports overall stability of left sided weakness.  She does acknowledge minimal improvement in her leg swelling, the right side is worse than the left right now.  Typically is relying on a walker, ambulating minimally.  Fatigue has been very persistent and problematic recently.  Denies seizures or headaches today. ? ?H+P (07/20/21) Patient presented to medical attention last month with several days, weeks of progressive mental status changes.  Family noticed she was confused and disoriented over normal conversation and activities of daily living.  Baseline she is functionally independent, lives alone, works full time with Museum/gallery exhibitions officer company.  CNS imaging demonstrated an enhancing mass in the right temporal lobe; she underwent craniotomy, resection with Dr. Reatha Armour on 07/06/21, which demonstrated glioblastoma.  Following surgery she  experienced worsening left sided weakness, which prompted rehabilitation admission.  At present, she is doing some walking on her own, albeit slowly.  Left arm and hand have improved, she is starting occupational therapy this week. ? ?Medications: ?Current Outpatient Medications on File Prior to Visit  ?Medication Sig Dispense Refill  ? acetaminophen (TYLENOL) 325 MG tablet Take 2 tablets (650 mg total) by mouth every 6 (six) hours as needed for mild pain (or Fever >/= 101).    ? ALPRAZolam (XANAX) 0.25 MG tablet Take 1 tablet (0.25 mg total) by mouth 2 (two) times daily as needed for anxiety. 10 tablet 0  ? b complex vitamins capsule Take 1 capsule by mouth daily.    ? cholecalciferol (VITAMIN D) 1000 UNITS tablet Take 2,000 Units by mouth daily.    ? docusate sodium (COLACE) 100 MG capsule Take 1 capsule (100 mg total) by mouth 2 (two) times daily. 10 capsule 0  ? famotidine (PEPCID) 20 MG tablet Take 1 tablet (20 mg total) by mouth daily. 30 tablet 2  ? fluticasone (FLONASE) 50 MCG/ACT nasal spray Place 1 spray into both nostrils daily as needed for allergies. OTC    ? furosemide (LASIX) 20 MG tablet Take 1 tablet (20 mg total) by mouth daily. 30 tablet 1  ? hydrochlorothiazide (HYDRODIURIL) 12.5 MG tablet Take 0.5 tablets (6.25 mg total) by mouth daily. 30 tablet 0  ? HYDROcodone-acetaminophen (NORCO/VICODIN) 5-325 MG tablet Take 1 tablet by mouth every 4 (four) hours as needed for moderate pain. (Patient not taking: Reported on 08/01/2021) 30 tablet 0  ? levothyroxine (SYNTHROID) 50 MCG tablet Take 1 tablet (50 mcg total) by mouth daily. 30 tablet 0  ? Multiple Vitamins-Minerals (  MULTIVITAMIN WITH MINERALS) tablet Take 1 tablet by mouth daily.    ? NON FORMULARY Apply topically as needed. Magne Sport Balm ?Apply to skin as needed for joint/muscle pain    ? ondansetron (ZOFRAN) 8 MG tablet Take 1 tablet (8 mg total) by mouth 2 (two) times daily as needed (nausea and vomiting). May take 30-60 minutes prior to  Temodar administration if nausea/vomiting occurs. (Patient not taking: Reported on 08/03/2021) 30 tablet 1  ? temozolomide (TEMODAR) 140 MG capsule Take 1 capsule (140 mg total) by mouth daily. May take on an empty stomach to decrease nausea & vomiting. (Patient not taking: Reported on 08/03/2021) 42 capsule 0  ? ?No current facility-administered medications on file prior to visit.  ? ? ?Allergies:  ?Allergies  ?Allergen Reactions  ? Fexofenadine Hives  ? Penicillins Itching and Other (See Comments)  ?  Pulling sensation in body  ? ?Past Medical History:  ?Past Medical History:  ?Diagnosis Date  ? Hyperlipidemia   ? Hypertension   ? Pneumonia   ? Thyroid disease   ? ?Past Surgical History:  ?Past Surgical History:  ?Procedure Laterality Date  ? ABDOMINAL HYSTERECTOMY  1983  ? APPLICATION OF CRANIAL NAVIGATION Right 07/06/2021  ? Procedure: APPLICATION OF CRANIAL NAVIGATION;  Surgeon: Dawley, Theodoro Doing, DO;  Location: [facility];  Service: Neurosurgery;  Laterality: Right;  ? CRANIOTOMY Right 07/06/2021  ? Procedure: CRANIOTOMY TUMOR EXCISION;  Surgeon: Karsten Ro, DO;  Location: [facility];  Service: Neurosurgery;  Laterality: Right;  ? TONSILLECTOMY  1965  ? TOOTH EXTRACTION    ? ?Social History:  ?Social History  ? ?Socioeconomic History  ? Marital status: Divorced  ?  Spouse name: Not on file  ? Number of children: 3  ? Years of education: 27  ? Highest education level: Not on file  ?Occupational History  ? Occupation: Masters degree in public service  ?  Employer: [employer]  ? Occupation: Working on Engineer, maintenance  ? Occupation: Training and development officer of [employer]  ?Tobacco Use  ? Smoking status: Never  ? Smokeless tobacco: Never  ?Vaping Use  ? Vaping Use: Never used  ?Substance and Sexual Activity  ? Alcohol use: No  ? Drug use: No  ? Sexual activity: Yes  ?  Birth control/protection: Surgical  ?Other Topics Concern  ? Not on file  ?Social History Narrative  ? Divorced, lives in  [place] alone.  Independent of ADLs.  ? ?Social Determinants of Health  ? ?Financial Resource Strain: Not on file  ?Food Insecurity: Not on file  ?Transportation Needs: Not on file  ?Physical Activity: Not on file  ?Stress: Not on file  ?Social Connections: Not on file  ?Intimate Partner Violence: Not on file  ? ?Family History:  ?Family History  ?Problem Relation Age of Onset  ? Diabetes Mother   ? Hypertension Mother   ? Hypertension Father   ? Leukemia Father   ? Stroke Father   ? Cancer Brother   ? Pneumonia Brother   ? ? ?Review of Systems: ?Constitutional: Doesn't report fevers, chills or abnormal weight loss ?Eyes: Doesn't report blurriness of vision ?Ears, nose, mouth, throat, and face: Doesn't report sore throat ?Respiratory: Doesn't report cough, dyspnea or wheezes ?Cardiovascular: Doesn't report palpitation, chest discomfort  ?Gastrointestinal:  Doesn't report nausea, constipation, diarrhea ?GU: Doesn't report incontinence ?Skin: Doesn't report skin rashes ?Neurological: Per HPI ?Musculoskeletal: Doesn't report joint pain ?Behavioral/Psych: Doesn't report anxiety ? ?Physical Exam: ?There were no  vitals filed for this visit. ? ?KPS: 70 ?ECOG 1 ?General: Alert, cooperative, pleasant, in no acute distress ?Head: Normal ?EENT: No conjunctival injection or scleral icterus.  ?Lungs: Resp effort normal ?Cardiac: Regular rate ?Abdomen: Non-distended abdomen ?Skin: No rashes cyanosis or petechiae. ?Extremities: 2+ dependent edema R>L ? ?Neurologic Exam: ?Mental Status: Awake, alert, attentive to examiner. Oriented to self and environment. Language is fluent with intact comprehension.  ?Cranial Nerves: Visual acuity is grossly normal. Visual fields are full. Extra-ocular movements intact. No ptosis. Face is symmetric ?Motor: Tone and bulk are normal. Power is 4+/5 in left arm and leg, asterixis noted with posturing. Reflexes are symmetric, no pathologic reflexes present.  ?Sensory: Intact to light touch ?Gait:  Hemiparetic ? ? ?Labs: ?I have reviewed the data as listed ?   ?Component Value Date/Time  ? NA 142 08/31/2021 1515  ? K 3.9 08/31/2021 1515  ? CL 106 08/31/2021 1515  ? CO2 30 08/31/2021 1515  ? GLUCOSE 106 (H)

## 2021-09-15 ENCOUNTER — Ambulatory Visit: Payer: BC Managed Care – PPO

## 2021-09-15 ENCOUNTER — Ambulatory Visit
Admission: RE | Admit: 2021-09-15 | Discharge: 2021-09-15 | Disposition: A | Discharge status: Home / Self Care | Payer: BC Managed Care – PPO | Source: Ambulatory Visit | Attending: Radiation Oncology | Admitting: Radiation Oncology

## 2021-09-15 ENCOUNTER — Encounter: Payer: Self-pay | Admitting: Radiation Oncology

## 2021-09-15 ENCOUNTER — Ambulatory Visit: Payer: BC Managed Care – PPO | Admitting: Occupational Therapy

## 2021-09-15 ENCOUNTER — Ambulatory Visit: Payer: BC Managed Care – PPO | Attending: Internal Medicine | Admitting: Physical Therapy

## 2021-09-15 ENCOUNTER — Encounter: Payer: Self-pay | Admitting: Physical Therapy

## 2021-09-15 DIAGNOSIS — R2681 Unsteadiness on feet: Secondary | ICD-10-CM | POA: Insufficient documentation

## 2021-09-15 DIAGNOSIS — Z510 Encounter for antineoplastic radiation therapy: Secondary | ICD-10-CM | POA: Diagnosis not present

## 2021-09-15 DIAGNOSIS — R41841 Cognitive communication deficit: Secondary | ICD-10-CM | POA: Insufficient documentation

## 2021-09-15 DIAGNOSIS — R278 Other lack of coordination: Secondary | ICD-10-CM | POA: Diagnosis present

## 2021-09-15 DIAGNOSIS — R41844 Frontal lobe and executive function deficit: Secondary | ICD-10-CM | POA: Insufficient documentation

## 2021-09-15 DIAGNOSIS — R41842 Visuospatial deficit: Secondary | ICD-10-CM | POA: Insufficient documentation

## 2021-09-15 DIAGNOSIS — M6281 Muscle weakness (generalized): Secondary | ICD-10-CM | POA: Insufficient documentation

## 2021-09-15 DIAGNOSIS — R2689 Other abnormalities of gait and mobility: Secondary | ICD-10-CM

## 2021-09-15 DIAGNOSIS — R41840 Attention and concentration deficit: Secondary | ICD-10-CM

## 2021-09-15 NOTE — Therapy (Signed)
[facility] ?[place], [place], [address] ?Phone: [phone]   Fax:  [phone] ? ?Speech Language Pathology Treatment ? ?Patient Details  ?Name: Denise Wright ?MRN: [id number] ?Date of Birth: 1950-07-05 ?Referring Provider (SLP): Lauraine Rinne, PA-C ? ? ?Encounter Date: 09/15/2021 ? ? End of Session - 09/15/21 1311   ? ? Visit Number 6   ? Number of Visits 25   ? Date for SLP Re-Evaluation 11/09/21   ? Authorization Type BCBS   ? SLP Start Time 1400   ? SLP Stop Time  1438   ? SLP Time Calculation (min) 38 min   ? Activity Tolerance Patient limited by fatigue;Patient limited by lethargy   ? ?  ?  ? ?  ? ? ?Past Medical History:  ?Diagnosis Date  ? Hyperlipidemia   ? Hypertension   ? Pneumonia   ? Thyroid disease   ? ? ?Past Surgical History:  ?Procedure Laterality Date  ? ABDOMINAL HYSTERECTOMY  1983  ? APPLICATION OF CRANIAL NAVIGATION Right 07/06/2021  ? Procedure: APPLICATION OF CRANIAL NAVIGATION;  Surgeon: Dawley, Theodoro Doing, DO;  Location: [facility];  Service: Neurosurgery;  Laterality: Right;  ? CRANIOTOMY Right 07/06/2021  ? Procedure: CRANIOTOMY TUMOR EXCISION;  Surgeon: Karsten Ro, DO;  Location: [facility];  Service: Neurosurgery;  Laterality: Right;  ? TONSILLECTOMY  1965  ? TOOTH EXTRACTION    ? ? ?There were no vitals filed for this visit. ? ? Subjective Assessment - 09/15/21 1404   ? ? Subjective "I'm tired"   ? Patient is accompained by: Family member   ? Currently in Pain? No/denies   ? ?  ?  ? ?  ? ? ? ? ? ? ? ? ADULT SLP TREATMENT - 09/15/21 1311   ? ?  ? General Information  ? Behavior/Cognition Alert;Cooperative;Pleasant mood;Lethargic;Requires cueing   ?  ? Treatment Provided  ? Treatment provided Cognitive-Linquistic   ?  ? Cognitive-Linquistic Treatment  ? Treatment focused on Cognition;Patient/family/caregiver education   ? Skilled Treatment Presented with increased fatigue and lethargy today. Delayed processing today. With  extended time, pt able to recall functional information with rare min A. SLP provided education and instruction of memory strategies and cognitive linguistic tasks to target at home, when less fatigue present. Handouts provided.   ?  ? Assessment / Recommendations / Plan  ? Plan Continue with current plan of care   ?  ? Progression Toward Goals  ? Progression toward goals Progressing toward goals   ? ?  ?  ? ?  ? ? ? SLP Education - 09/15/21 1533   ? ? Education Details HEP, memory strategies   ? Person(s) Educated Patient;Child(ren)   ? Methods Demonstration;Explanation;Verbal cues;Handout   ? Comprehension Verbalized understanding;Returned demonstration;Need further instruction   ? ?  ?  ? ?  ? ? ? SLP Short Term Goals - 09/15/21 1312   ? ?  ? SLP SHORT TERM GOAL #1  ? Title pt will complete standardized testing   ? Status Achieved   ? Target Date 08/25/21   ?  ? SLP SHORT TERM GOAL #2  ? Title Pt will verbalize 3 attention/memory compensations for making/tracking appointments, managing meds, and other daily tasks in 2 sessions   ? Baseline 08-23-21   ? Status Partially Met   ? Target Date 09/08/21   ?  ? SLP SHORT TERM GOAL #3  ? Title pt will verbally generate and carryover (with  min A) solutions to problems encountered in everyday living in 2 sessions   ? Baseline 08-23-21   ? Time 3   ? Period Weeks   ? Status On-going   ? Target Date 09/22/21   ?  ? SLP SHORT TERM GOAL #4  ? Title pt will verbally state errors on mod complex written or verbal tasks with nonverbal cues   ? Baseline 08-23-21   ? Time 3   ? Period Weeks   ? Status On-going   ? Target Date 09/22/21   ? ?  ?  ? ?  ? ? ? SLP Long Term Goals - 09/15/21 1312   ? ?  ? SLP LONG TERM GOAL #1  ? Title Pt will verbalize AND IMPLEMEMENT 3 attention/memory compensations for making/tracking appointments, managing meds, and other daily tasks in 4 sessions   ? Time 4   ? Period Weeks   ? Status On-going   ? Target Date 10/06/21   ?  ? SLP LONG TERM GOAL #2  ?  Title Pt will score higher on a patient-reported outcome measure in the last 1-2 visits than when taken in the first 2 sessions   ? Time 8   ? Period Weeks   ? Status On-going   ? Target Date 11/09/21   ?  ? SLP LONG TERM GOAL #3  ? Title pt will verbally generate and independently carryover solutions to problems encountered in everyday living with min A in 4 sessions   ? Time 8   ? Period Weeks   ? Status On-going   ? Target Date 11/09/21   ?  ? SLP LONG TERM GOAL #4  ? Title pt will note verbal or written errors in work-like tasks independently for improving verbal and/or written accuracy   ? Time 8   ? Period Weeks   ? Status On-going   ? Target Date 11/09/21   ? ?  ?  ? ?  ? ? ? Plan - 09/15/21 1312   ? ? 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 Session limited by increased lethargy and fatigue; focused session on caregiver education and instruction for implementation of memory/attention compensations and functional cognitive linguistic tasks to target at home to maximize outcomes. Her performance in some of these areas may decr as she progresses through chemo/rad tx due to temporary physiological changes to the brain. She will benefit from skilled ST targeting these deficits.   ? Speech Therapy Frequency 2x / week   ? Duration 12 weeks   pt may schedule x1/week for 6 weeks due to chemo/rad schedule  ? Treatment/Interventions Cognitive reorganization;Environmental controls;Internal/external aids;Compensatory strategies;Patient/family education;Functional tasks;Cueing hierarchy   ? Potential to Achieve Goals Good   ? Consulted and Agree with Plan of Care Patient   ? ?  ?  ? ?  ? ? ?Patient will benefit from skilled therapeutic intervention in order to improve the following deficits and impairments:   ?Cognitive communication deficit ? ? ? ?Problem  List ?Patient Active Problem List  ? Diagnosis Date Noted  ? Goals of care, counseling/discussion 07/20/2021  ? 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[facility]) 07/12/2021  ? Brain mass 06/30/2021  ? Hypothyroidism 06/30/2021  ? Hypokalemia 06/30/2021  ? Essential hypertension 05/25/2021  ? Hyperglycemia 07/14/2011  ? Normocytic anemia 07/13/2011  ? ? ?Marzetta Board, CCC-SLP ?09/15/2021, 3:36 PM ? ?[facility] ?  [place] ?[place], [place], [address] ?Phone: [phone]   Fax:  [phone] ? ? ?Name: Keiona Jenison Lanzo ?MRN: [id number] ?Date of Birth: January 14, 1951 ? ?

## 2021-09-15 NOTE — Patient Instructions (Addendum)
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 ?Focus on 1 book at a time. You said you wanted to focus of Ida B. Read Chapter 4 and be prepared to tell me about it next ST session ? ?Memory Strategies ? ?W - Write it down ? ?A - Associate it with something ? ?R - Repeat it ? ?M - Mental Image ? ? ?Play the memory game ? ?Try to remember 3-5 items on your store list without looking ? ?Study a detailed picture in a magazine for 1 minute, then write down everything you can remember from the picture ?__________________________________________________________________________________ ? ?Checkers ?Chess ?Connect 4 ?[place]  ?Card games ?Jig saw puzzles ?Easy cross words ?Memory match ?Board games ?Dominoes ?[place] ?Learn a new game! ? ?Listen to and discuss [place] or Podcasts ?Read and discuss short articles of interest to you- Take notes on these if memory is a challenge ?Discuss social media posts ?Look and discuss photo albums ? ?The best activities to improve cognition are functional, real life activities that are important to you: ? ?Plan a menu ?Participate in household chores and decisions (with supervision) ?Participate in managing finances ?Plan a party, trip or tailgate with all of the details (even if you aren't really going to carry it out) ?Participate in your hobby as you are able with assistance ?Manage your texts, emails with supervision if needed. ?Google search for items (even if you're not really going to buy anything) and compare prices and features ?Socialize -  however, too many visitors can be overwhelming, so set limits "My doctor said I should only visit (or talk) for 20 minutes" or "I do better when I visit with just 1-2 people at a time for 20 minutes" ? ?Apps: ? ?Constant Therapy - Free for 14 days ?NeuroHQ ?Elevate ?There are apps for most of the games listed above ? ?

## 2021-09-15 NOTE — Therapy (Signed)
?OUTPATIENT PHYSICAL THERAPY TREATMENT NOTE ? ? ?Patient Name: Denise Wright ?MRN: [id number] ?DOB:1950/08/05, 71 y.o., female ?Today's Date: 09/15/2021 ? ?PCP: Willey Blade, MD ?REFERRING PROVIDER: Cathlyn Parsons, PA-C ? ? PT End of Session - 09/15/21 1242   ? ? Visit Number 6   ? Number of Visits 15   ? Date for PT Re-Evaluation 10/20/21   ? Progress Note Due on Visit 10   ? PT Start Time 0017   pt late  ? PT Stop Time 1315   ? PT Time Calculation (min) 39 min   ? Equipment Utilized During Treatment Gait belt   ? Activity Tolerance Patient tolerated treatment well   ? Behavior During Therapy [facility] for tasks assessed/performed   ? ?  ?  ? ?  ? ? ? ?Past Medical History:  ?Diagnosis Date  ? Hyperlipidemia   ? Hypertension   ? Pneumonia   ? Thyroid disease   ? ?Past Surgical History:  ?Procedure Laterality Date  ? ABDOMINAL HYSTERECTOMY  1983  ? APPLICATION OF CRANIAL NAVIGATION Right 07/06/2021  ? Procedure: APPLICATION OF CRANIAL NAVIGATION;  Surgeon: Dawley, Theodoro Doing, DO;  Location: [facility];  Service: Neurosurgery;  Laterality: Right;  ? CRANIOTOMY Right 07/06/2021  ? Procedure: CRANIOTOMY TUMOR EXCISION;  Surgeon: Karsten Ro, DO;  Location: [facility];  Service: Neurosurgery;  Laterality: Right;  ? TONSILLECTOMY  1965  ? TOOTH EXTRACTION    ? ?Patient Active Problem List  ? Diagnosis Date Noted  ? Goals of care, counseling/discussion 07/20/2021  ? 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[facility]) 07/12/2021  ? Brain mass 06/30/2021  ? Hypothyroidism 06/30/2021  ? Hypokalemia 06/30/2021  ? Essential hypertension 05/25/2021  ? Hyperglycemia 07/14/2011  ? Normocytic anemia 07/13/2011  ? ? ?REFERRING DIAG: D49.6 (ICD-10-CM) - Brain tumor ([place])  ? ?THERAPY DIAG:  ?Other abnormalities of gait and mobility ? ?Muscle weakness (generalized) ? ?Other lack of coordination ? ?Unsteadiness on feet ? ?PERTINENT HISTORY: HTN, hypothyroidism, hyperglycemia, normocytic anemia, glioblastoma with isocitrate  dehydrogenase gene wildtype ? ?PRECAUTIONS: Fall ? ?SUBJECTIVE: Pt visibly more fatigued with inc posterior lean today.  Pt has been using PowerFit Compact machine to do 10 mins x3 of exercise sitting.  "This has been a trying week."  Pt unable to do as much activity due to excessive fatigue. ? ?PAIN:  ?Are you having pain? No ?VITALS:  R BP assessed in sitting prior to activity 140/82 ? ?TODAY'S TREATMENT: ?STGs assessed.  See goal section. ?Weaving through cones in narrow spacing to promote turning.  CGA ?Gait training w/o AD on level surface in gym using constant CGA cued for marching and soft steps to prevent scuffing BLEs L>R.  3x115'.  Pt with inc L lean and increased distraction noted today with intermittent slowing and freezing of gait.  Pt demos inc instability during turning, cued to prevent scissoring and L LOB. ?Obstacle negotiation around busy gym w/ pt showing dec attention to far left running into objects x3 despite PT cuing prior to approach. ? ? ?PATIENT EDUCATION: ?Education details: Continued edu on walking at home for activity tolerance with family member for safety.  Edu regarding monitoring fatigue and guarding on L side for safety. ?Person educated: Patient, daughter ?Education method: Explanation ?Education comprehension: verbalized understanding ? ?HEP: ?Access Code: [email or web address] ?Initiated walking program 09/01/2021 with supervision in home environment/neighborhood on level paved surface w/ or w/o AD for endurance. ? ?OBJECTIVE:  ?  ?DIAGNOSTIC FINDINGS: Pt had MRI prior to surgical resection  of tumor ?  ?COGNITION: ?Overall cognitive status: Within functional limits for tasks assessed ?  ?            ?SENSATION: ?Light touch: Appears intact ?Hot/Cold: Appears intact-pt reports no difficulty noting difference in shower and when washing hands ?  ?COORDINATION: ?Fingertip opposition: normal bilaterally ?Heel-to-shin:  normal bilaterally; inc time ?  ?EDEMA:  ?Mild bilateral LEs-pt states she has  had this for a while now. ?  ?MUSCLE TONE: None noted in LE ?  ?POSTURE: rounded shoulders, forward head, and increased thoracic kyphosis ?  ?AROM/PROM:   ?  ?AROM Right ?08/11/2021 Left ?08/11/2021  ?Hip flexion WFL WFL  ?Hip extension " "  ?Hip abduction " "  ?Hip adduction " "  ?Hip internal rotation " "  ?Hip external rotation " "  ?Knee flexion " "  ?Knee extension " "  ?Ankle dorsiflexion " "  ?Ankle plantarflexion " "  ? (Blank rows = not tested) ?MMT: ?Left shoulder 4-/5 ?MMT Right ?08/11/2021 Left ?08/11/2021  ?Hip flexion 4/5 3/5  ?Hip extension      ?Hip abduction 4/5 4/5  ?Hip adduction 5/5 5/5  ?Hip internal rotation      ?Hip external rotation      ?Knee flexion 4-/5 3+/5  ?Knee extension 4+/5 4+/5  ?Ankle dorsiflexion 5/5 4+/5; no pain  ?Ankle plantarflexion      ?Ankle inversion      ?Ankle eversion      ?(Blank rows = not tested) ?  ?TRANSFERS: ?Assistive device utilized: Walker - 2 wheeled  ?Sit to stand: Modified independence ?Stand to sit: Modified independence ?  ?STAIRS: 6", 4 step up using bilateral rails, step-to pattern, up with LLE, down with RLE.  Edu on safe stair negotiation, "up with the good, down with the bad" to prevent falls on steps into home.   ?  ?GAIT: ?Gait pattern: step to pattern, decreased stride length, and trunk flexed ?Distance walked: Several bouts in and around gym for balance and fall risk assessments. ?Assistive device utilized: Walker - 2 wheeled ?Level of assistance: SBA ?  ?FUNCTIONAL TESTs:  ?5 times sit to stand: 15.91 sec ?Timed up and go (TUG): 19.53 sec w/ RW ?10 meter walk test: (normal)=0.56 m/sec or 1.86ft/sec; (Fast)=.77 m/sec or 2.56 ft/sec  w/ RW  ?  ?ASSESSMENT: ?  ?CLINICAL IMPRESSION: ?Pt is significantly limited by fatigue and increasingly distracted in session today requiring frequent and prolonged rest and redirection.  STGs reassessed with pt meeting 3/4 goals.  Her TUG time increased from 19.53 to 23.58 seconds.  She is otherwise progressing towards LTGs  and will continue with POC as able. ?  ?  ?  ?OBJECTIVE IMPAIRMENTS Abnormal gait, decreased activity tolerance, decreased balance, decreased endurance, decreased mobility, difficulty walking, decreased strength, and increased edema.  ?  ?ACTIVITY LIMITATIONS cleaning, community activity, driving, meal prep, occupation, laundry, yard work, and shopping.  ?  ?PERSONAL FACTORS HTN, hypothyroidism, hyperglycemia are also affecting patient's functional outcome.  ?  ?  ?REHAB POTENTIAL: Good ?  ?CLINICAL DECISION MAKING: Evolving/moderate complexity ?  ?EVALUATION COMPLEXITY: Moderate ?  ?  ?GOALS: ?Goals reviewed with patient? Yes ?  ?SHORT TERM GOALS: ?  ?Pt will be independent with strength and balance HEP with supervision from family. ?Baseline: to be initiated at second visit ?Target date: 09/15/2021 ?Goal status: MET ?  ?2.  Pt will decrease 5xSTS to <13 seconds in order to demonstrate decreased risk for falls and improved functional bilateral LE   strength and power. ?Baseline: 15.91 sec; 09/15/2021 12.10 seconds ?Target date: 09/15/2021 ?Goal status: MET ?  ?3.  Pt will demonstrate TUG of <16 seconds in order to decrease risk of falls and improve functional mobility using LRAD. ?Baseline: 19.53 seconds; 23.58 sec 09/15/2021 ?Target date: 09/15/2021 ?Goal status: NOT MET ?  ?4.  Pt will demonstrate a gait speed of >2 feet/sec at normal pace w/ LRAD in order to decrease risk for falls.  ?Baseline: 1.86 ft/sec w/ RW; 2.39 ft/sec w/o AD requiring CGA ?Target date: 09/15/2021 ?Goal status: MET ?  ?LONG TERM GOALS: ?  ?Pt will increase BERG balance score to 46/56 to demonstrate improved static balance. ?Baseline: 43/56 ?Target date: 10/20/2021 ?Goal status: INITIAL ?  ?2.  Pt will ambulate>300' feet on 2MWT to demonstrate improved endurance for functional tasks in home and community. ?Baseline: 2MWT w/ RW: 219' & 2MWT w/o AD: 215' ?Target date: 10/20/2021 ?Goal status: INITIAL ?  ?3.  Pt will ambulate up eight 6" stairs using  unilateral rails w/ step-through pattern. ?Baseline: Pt ambulates up 4 stairs using bilateral rails w/ step-to pattern. ?Target date: 10/20/2021 ?Goal status: INITIAL ?  ?4.  Pt will ambulate 500 feet with LRAD and SBA le

## 2021-09-15 NOTE — Therapy (Signed)
?OUTPATIENT OCCUPATIONAL THERAPY TREATMENT NOTE ? ? ?Patient Name: Elinda Bunten Bethel ?MRN: [id number] ?DOB:May 06, 1951, 71 y.o., female ?Today's Date: 09/15/2021 ? ?PCP: Willey Blade, MD ?REFERRING PROVIDER: [place] with f/u with Vaslow ? ? OT End of Session - 09/15/21 1325   ? ? Visit Number 6   ? Number of Visits 9   ? Date for OT Re-Evaluation 10/06/21   ? Authorization Type BCBS   ? Authorization Time Period VL: 30 PT/OT combined, 30 ST, Auth NOT req'd   ? OT Start Time 1320   ? OT Stop Time 1400   ? OT Time Calculation (min) 40 min   ? Activity Tolerance Patient tolerated treatment well;Patient limited by fatigue   ? Behavior During Therapy [facility] for tasks assessed/performed   ? ?  ?  ? ?  ? ? ?Past Medical History:  ?Diagnosis Date  ? Hyperlipidemia   ? Hypertension   ? Pneumonia   ? Thyroid disease   ? ?Past Surgical History:  ?Procedure Laterality Date  ? ABDOMINAL HYSTERECTOMY  1983  ? APPLICATION OF CRANIAL NAVIGATION Right 07/06/2021  ? Procedure: APPLICATION OF CRANIAL NAVIGATION;  Surgeon: Dawley, Theodoro Doing, DO;  Location: [facility];  Service: Neurosurgery;  Laterality: Right;  ? CRANIOTOMY Right 07/06/2021  ? Procedure: CRANIOTOMY TUMOR EXCISION;  Surgeon: Karsten Ro, DO;  Location: [facility];  Service: Neurosurgery;  Laterality: Right;  ? TONSILLECTOMY  1965  ? TOOTH EXTRACTION    ? ?Patient Active Problem List  ? Diagnosis Date Noted  ? Goals of care, counseling/discussion 07/20/2021  ? 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[place]) 07/12/2021  ? Brain mass 06/30/2021  ? Hypothyroidism 06/30/2021  ? Hypokalemia 06/30/2021  ? Essential hypertension 05/25/2021  ? Hyperglycemia 07/14/2011  ? Normocytic anemia 07/13/2011  ? ? ?ONSET DATE: 07/06/21 ? ?REFERRING DIAG: GBM ? ?THERAPY DIAG:  ?Other abnormalities of gait and mobility ? ?Frontal lobe and executive function deficit ? ?Muscle weakness (generalized) ? ?Other lack of coordination ? ?Unsteadiness on feet ? ?Visuospatial deficit ? ?Attention  and concentration deficit ? ? ?PERTINENT HISTORY: PMH: HTN, HLD, Pneumonia, Thyroid Disease, Active Cancer  ? ?PRECAUTIONS: GBM with chemotherapy/radiation treatment currently  ? ?SUBJECTIVE: "I get to ring the bell today after Radiation" ? ?PAIN:  ?Are you having pain? No ? ? ? ? ?OBJECTIVE:  ? ?TODAY'S TREATMENT:  ? ?09/15/21 ?[employer] req'd min cues for separating like pieces and mod/max cues for following pattern Fig 8. Pt completed Rt side first and req'd cues for completing left side of pattern. Pt req'd increased time. Pt unable to attend to the left side enough to follow the pattern. Pt putting pieces away and req'd mod cues for attending to LUE to placing into bag. ? ? ? ? OT Short Term Goals  ? ?  ? OT SHORT TERM GOAL #1  ? Title Pt will be independent with initial HEP for grip strength in LUE   ? Time 4   ? Period Weeks   ? Status Achieved   independent with yellow theraputty  ? Target Date 09/08/21   ? ?  ?  ? ?  ? ? ? OT Long Term Goals  ? ?  ? OT LONG TERM GOAL #1  ? Title Pt will be independent with updated HEP   ? Time 8   ? Period Weeks   ? Status On-going   ? Target Date 10/06/21   ?  ? OT LONG TERM GOAL #2  ? Title  Pt will verbalize understanding of energy conservation strategies   ? Time 8   ? Period Weeks   ? Status Achieved   ?  ? OT LONG TERM GOAL #3  ? Title Pt complete all basic ADLs with Mod I and good safety awareness with ecxeption of compression stockings.   ? Baseline min A   ? Time 8   ? Period Weeks   ? Status Achieved   ?  ? OT LONG TERM GOAL #4  ? Title Pt will perform environmental scanning with cog component with 90% accuracy or greater.   ? Time 8   ? Period Weeks   ? Status On-going   ?  ? OT LONG TERM GOAL #5  ? Title Pt will perform simple warm meal prep and/or light housekeeping with good safety awareness and distant supervision   ? Time 8   ? Period Weeks   ? Status On-going   ? ?  ?  ? ?  ? ? ? Plan - 09/08/21 1233   ? ? Clinical Impression Statement Pt with increased  fatigue today. Pt making good progress and progressing towards goals. Anticipate d/c next visit.  ? OT Occupational Profile and History Problem Focused Assessment - Including review of records relating to presenting problem   ? Occupational performance deficits (Please refer to evaluation for details): IADL's;ADL's;Leisure   ? Body Structure / Function / Physical Skills ADL;Decreased knowledge of use of DME;Strength;UE functional use;ROM;IADL   ? Cognitive Skills Attention;Safety Awareness;Problem Solve;Energy/Drive   ? Rehab Potential Good   ? Clinical Decision Making Limited treatment options, no task modification necessary   ? Comorbidities Affecting Occupational Performance: None   ? Modification or Assistance to Complete Evaluation  No modification of tasks or assist necessary to complete eval   ? OT Frequency 1x / week   ? OT Duration 8 weeks   ? OT Treatment/Interventions Self-care/ADL training;Functional Mobility Training;Neuromuscular education;Patient/family education;Therapeutic exercise;Cognitive remediation/compensation;Visual/perceptual remediation/compensation;Therapeutic activities;DME and/or AE instruction;Energy conservation   ? Plan Plan to check goals and d/c next session/end of visits  ? Consulted and Agree with Plan of Care Patient;Family member/caregiver   ? Family Member Consulted daughter   ? ?  ?  ? ?  ? ? ? ?Zachery Conch, OT ?09/15/2021, 2:59 PM ? ?  ? ?  ?

## 2021-09-18 ENCOUNTER — Inpatient Hospital Stay: Payer: BC Managed Care – PPO | Admitting: Emergency Medicine

## 2021-09-18 DIAGNOSIS — C719 Malignant neoplasm of brain, unspecified: Secondary | ICD-10-CM

## 2021-09-18 NOTE — Research (Signed)
[id number] A Single Arm, Pilot Study of Ramipril for Preventing Radiation-Induced Cognitive Decline in Glioblastoma (GBM) Patients Receiving Brain Radiotherapy ?  ?END OF WEEK 6 TITRATION (Week 7 Tx Schedule) ?  ?Patient arrives today (09/18/21) Accompanied by [name] for her visit. ?  ?LABS: ?Standard of care labs were collected on 09/14/21 (CBC and CMP)- reviewed same day by MD Vaslow.  Abnormal labs reviewed in AE section below. ?  ?PROs and Neurocognitive Tests: ? Patient completed all PROs and Neurocognitive testing today with Denise Wright, Research Asst. ? ?MEDICATION REVIEW: ?Patient reviews and verifies the current medication list is correct.  Patient denies any missed doses of Temodar before it was d/c on 09/14/21 by MD Vaslow d/t lab values.  Patient's lasix dose was also changed from 20 mg daily to 40 mg daily by MD Vaslow on 09/14/21.  Reported changes were recorded on the medication list and marked as reviewed. All other listed medications remain the same. ?  ?VITAL SIGNS: ?Vital signs are collected per study protocol.  BP today 109/63. ?  ?ADVERSE EVENTS:  All graded using CTCAE v. 5.0 ?Patient Denise Wright reports ongoing bilateral ankle pain/arthralgia (Grade 1 for both) with no change, remains unrelated to study drug, no action or reporting required at this time. ?The patient also reports ongoing bilateral leg swelling/edema limbs (Grade 2) with no change, remains unrelated to study drug, no action or reporting required at this time. ?Patient reports ongoing fatigue (Grade 2) which remains unchanged and unrelated to study drug, no action or reporting required at this time.  Fatigue was erroneously not reported on AE log during last week's (09/11/21) visit but was an ongoing Grade 2 at the time and no intervention or f/u or reporting was required. ?CMP done on 09/14/21 shows the following: ?Increased creatinine (Grade 1) of 1.67, pt remains off drug at this time.  Per MD Vaslow still unrelated to study  drug, no reporting or action required at this time. ?eGFR is decreased at 33 ml/min but per MD Vaslow does not meet requirements for Chronic kidney disease, per MD this value is clinically insignificant at this time and unrelated to study drug. ?White blood cell decreased (Grade 1) to 3.8, no intervention.  Per MD Vaslow unrelated to study drug, no action or reporting required at this time. ?Anemia (Grade 1) at 10.1 Hgb, no intervention.  Per MD Vaslow unrelated to study drug, no action or reporting required this time. ?All other abnormal lab values ruled as clinically insignificant and unrelated to study drug by MD Vaslow and do not require reporting or action at this time. ?  ?ADVERSE EVENT LOG:  ?Denise Wright ?[id number] ?[id number] ?  ?09/18/2021 ?  ?Adverse Event Log ?  ?Study/Protocol: WF 1801 ?Cycle: End of Week 6 Titration ?  ?Event Grade Onset Date Resolved Date Drug Name Attribution Treatment Comments  ?Arthralgia (L ankle pain) Grade 1 08/09/21 Ongoing Ramipril Unrelated Sports balm, elevation, epsom salts    ?Arthralgia (R ankle pain) Grade 1 08/13/21 Ongoing Ramipril Unrelated Sports balm, elevation, epsom salts    ?Increased creatinine Grade 1 08/10/21 Ongoing Ramipril Unrelated None    ?Edema limbs (bilateral leg swelling) Grade 2 08/15/21 Ongoing Ramipril Unrelated Lasix    ?Fatigue Grade 2 08/27/21 Ongoing Ramipril Unrelated None   ?White blood cell decreased Grade 1 09/14/21 Ongoing Ramipril Unrelated None   ?Anemia Grade 1 09/14/21 Ongoing Ramipril Unrelated None   ?  ?  ?MEDICATION DIARIES and IRT: ?Patient remains off  study drug at this time. ? ?DISPOSITION: ?Upon completion off all study requirements, patient was escorted to exit with family and belongings.  Patient will be returning in 4 weeks for 1 month post RT visit, will coordinate with patient to schedule all protocol required visits/interventions. ?  ?The patient was thanked for their time and continued voluntary participation in this study.  Patient Denise Wright has been provided direct contact information and is encouraged to contact this Nurse for any needs or questions. ? ?Wells Guiles 'Learta Codding' Piedad Standiford, RN, BSN ?Clinical Research Nurse I ?09/18/21 ?3:30 PM ? ?

## 2021-09-22 ENCOUNTER — Encounter: Payer: Self-pay | Admitting: Physical Therapy

## 2021-09-22 ENCOUNTER — Ambulatory Visit: Payer: BC Managed Care – PPO | Admitting: Physical Therapy

## 2021-09-22 ENCOUNTER — Ambulatory Visit: Payer: BC Managed Care – PPO | Admitting: Occupational Therapy

## 2021-09-22 ENCOUNTER — Encounter: Payer: Self-pay | Admitting: Occupational Therapy

## 2021-09-22 ENCOUNTER — Ambulatory Visit: Payer: BC Managed Care – PPO

## 2021-09-22 DIAGNOSIS — R278 Other lack of coordination: Secondary | ICD-10-CM

## 2021-09-22 DIAGNOSIS — M6281 Muscle weakness (generalized): Secondary | ICD-10-CM

## 2021-09-22 DIAGNOSIS — R41840 Attention and concentration deficit: Secondary | ICD-10-CM

## 2021-09-22 DIAGNOSIS — R2689 Other abnormalities of gait and mobility: Secondary | ICD-10-CM | POA: Diagnosis not present

## 2021-09-22 DIAGNOSIS — R2681 Unsteadiness on feet: Secondary | ICD-10-CM

## 2021-09-22 DIAGNOSIS — R41842 Visuospatial deficit: Secondary | ICD-10-CM

## 2021-09-22 DIAGNOSIS — R41841 Cognitive communication deficit: Secondary | ICD-10-CM

## 2021-09-22 DIAGNOSIS — R41844 Frontal lobe and executive function deficit: Secondary | ICD-10-CM

## 2021-09-22 NOTE — Patient Instructions (Signed)
Prioritize tasks  ?Eliminate distractions (turn off/remove phone) ?Given yourself extra time (longer than usual) ?Work during your best time of day  ? ?Personal goal: drink one Smart Water a day  ? ?Consider attending church online or in person. You could always listen to it later in the week if that works better for you.  ?Consider how is your attention during an hour long sermon. ?Consider how is your memory of what said.  ?

## 2021-09-22 NOTE — Therapy (Signed)
?OUTPATIENT OCCUPATIONAL THERAPY TREATMENT NOTE ? ? ?Patient Name: Denise Wright ?MRN: [id number] ?DOB:11/19/1950, 71 y.o., female ?Today's Date: 09/22/2021 ? ?PCP: Willey Blade, MD ?REFERRING PROVIDER: Hartman with f/u with [facility] ? ? OT End of Session - 09/22/21 1236   ? ? Visit Number 7   ? Number of Visits 9   ? Date for OT Re-Evaluation 10/06/21   ? Authorization Type BCBS   ? Authorization Time Period VL: 30 PT/OT combined, 30 ST, Auth NOT req'd   ? OT Start Time 1235   ? OT Stop Time 1300   ? OT Time Calculation (min) 25 min   ? Activity Tolerance Patient tolerated treatment well;Patient limited by fatigue   ? Behavior During Therapy [facility] for tasks assessed/performed   ? ?  ?  ? ?  ? ? ?Past Medical History:  ?Diagnosis Date  ? Hyperlipidemia   ? Hypertension   ? Pneumonia   ? Thyroid disease   ? ?Past Surgical History:  ?Procedure Laterality Date  ? ABDOMINAL HYSTERECTOMY  1983  ? APPLICATION OF CRANIAL NAVIGATION Right 07/06/2021  ? Procedure: APPLICATION OF CRANIAL NAVIGATION;  Surgeon: Dawley, Theodoro Doing, DO;  Location: [place];  Service: Neurosurgery;  Laterality: Right;  ? CRANIOTOMY Right 07/06/2021  ? Procedure: CRANIOTOMY TUMOR EXCISION;  Surgeon: Karsten Ro, DO;  Location: [place];  Service: Neurosurgery;  Laterality: Right;  ? TONSILLECTOMY  1965  ? TOOTH EXTRACTION    ? ?Patient Active Problem List  ? Diagnosis Date Noted  ? Goals of care, counseling/discussion 07/20/2021  ? 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 Brain mass 06/30/2021  ? Hypothyroidism 06/30/2021  ? Hypokalemia 06/30/2021  ? Essential hypertension 05/25/2021  ? Hyperglycemia 07/14/2011  ? Normocytic anemia 07/13/2011  ? ? ?ONSET DATE: 07/06/21 ? ?REFERRING DIAG: GBM ? ?THERAPY DIAG:  ?Attention and concentration deficit ? ?Other abnormalities of gait and mobility ? ?Frontal lobe and executive function deficit ? ?Muscle weakness (generalized) ? ?Unsteadiness on feet ? ?Other lack of  coordination ? ?Visuospatial deficit ? ? ?PERTINENT HISTORY: PMH: HTN, HLD, Pneumonia, Thyroid Disease, Active Cancer  ? ?PRECAUTIONS: GBM with chemotherapy/radiation treatment currently  ? ?SUBJECTIVE: "Tired" - pt report no increase in fatigue but about the same. "I rang the bell" ? ?PAIN:  ?Are you having pain? Yes: NPRS scale: 7/10 ?Pain location: headache - left ?Pain description: headache ?Aggravating factors:   ?Relieving factors:   ? ? ? ? ?OBJECTIVE:  ? ?TODAY'S TREATMENT:  ? ? ?09/22/21 ?Checked goals - Pt met all STGs and 4/5 LTGs.  ?Environmental Scanning with 15/15 accuracy = 100% on first pass. Pt required 2 cues for locating passing items in sequence 1-15 ? ?OCCUPATIONAL THERAPY DISCHARGE SUMMARY ? ?Visits from Start of Care: 7 ? ?Current functional level related to goals / functional outcomes: ?Pt has progress with visual scanning strategies, coordination and increased independence.  ?  ?Remaining deficits: ?Continues with unsteadiness on feet and some assistance for ADLs and increased fatigue ?  ?Education / Equipment: ?HEP and visual scanning strategies  ? ?Patient agrees to discharge. Patient goals were partially met. Patient is being discharged due to maximized rehab potential. . ? ? ? ? ? ? ? ?09/15/21 ?[employer] req'd min cues for separating like pieces and mod/max cues for following pattern Fig 8. Pt completed Rt side first and req'd cues for completing left side of pattern. Pt req'd increased time. Pt unable to attend to the left side enough to  follow the pattern. Pt putting pieces away and req'd mod cues for attending to LUE to placing into bag. ? ? ? ? OT Short Term Goals  ? ?  ? OT SHORT TERM GOAL #1  ? Title Pt will be independent with initial HEP for grip strength in LUE   ? Time 4   ? Period Weeks   ? Status Achieved   independent with yellow theraputty  ? Target Date 09/08/21   ? ?  ?  ? ?  ? ? ? OT Long Term Goals  ? ?  ? OT LONG TERM GOAL #1  ? Title Pt will be independent with  updated HEP   ? Time 8   ? Period Weeks   ? Status On-going   ? Target Date 10/06/21   ?  ? OT LONG TERM GOAL #2  ? Title Pt will verbalize understanding of energy conservation strategies   ? Time 8   ? Period Weeks   ? Status Achieved   ?  ? OT LONG TERM GOAL #3  ? Title Pt complete all basic ADLs with Mod I and good safety awareness with ecxeption of compression stockings.   ? Baseline min A   ? Time 8   ? Period Weeks   ? Status Achieved   ?  ? OT LONG TERM GOAL #4  ? Title Pt will perform environmental scanning with cog component with 90% accuracy or greater.   ? Time 8   ? Period Weeks   ? Status Achieved 90% accuracy with increased time and 2 cues.  ?  ? OT LONG TERM GOAL #5  ? Title Pt will perform simple warm meal prep and/or light housekeeping with good safety awareness and distant supervision   ? Time 8   ? Period Weeks   ? Status Not Met pt reports fixing her own cereal but not making any warm meals and assisting with dust mop, folding clothes, etc.  ? ?  ?  ? ?  ? ? ? Plan - 09/08/21 1233   ? ? Clinical Impression Statement Pt has maximized potential with OT and met most of her goals with exception of cooking and light housekeeping but currently has family for these tasks right now.   ? OT Occupational Profile and History Problem Focused Assessment - Including review of records relating to presenting problem   ? Occupational performance deficits (Please refer to evaluation for details): IADL's;ADL's;Leisure   ? Body Structure / Function / Physical Skills ADL;Decreased knowledge of use of DME;Strength;UE functional use;ROM;IADL   ? Cognitive Skills Attention;Safety Awareness;Problem Solve;Energy/Drive   ? Rehab Potential Good   ? Clinical Decision Making Limited treatment options, no task modification necessary   ? Comorbidities Affecting Occupational Performance: None   ? Modification or Assistance to Complete Evaluation  No modification of tasks or assist necessary to complete eval   ? OT Frequency 1x /  week   ? OT Duration 8 weeks   ? OT Treatment/Interventions Self-care/ADL training;Functional Mobility Training;Neuromuscular education;Patient/family education;Therapeutic exercise;Cognitive remediation/compensation;Visual/perceptual remediation/compensation;Therapeutic activities;DME and/or AE instruction;Energy conservation   ? Plan OT discharge  ? Consulted and Agree with Plan of Care Patient;Family member/caregiver   ? Family Member Consulted daughter   ? ?  ?  ? ?  ? ? ? ?Zachery Conch, OT ?09/22/2021, 1:13 PM ? ?  ? ?  ?

## 2021-09-22 NOTE — Therapy (Signed)
?OUTPATIENT PHYSICAL THERAPY TREATMENT NOTE ? ? ?Patient Name: Denise Wright ?MRN: [id number] ?DOB:Aug 07, 1950, 70 y.o., female ?Today's Date: 09/22/2021 ? ?PCP: Willey Blade, MD ?REFERRING PROVIDER: Cathlyn Parsons, PA-C ? ? PT End of Session - 09/22/21 1316   ? ? Visit Number 7   ? Number of Visits 15   ? Date for PT Re-Evaluation 10/20/21   ? Progress Note Due on Visit 10   ? PT Start Time 1315   ? PT Stop Time 1355   ? PT Time Calculation (min) 40 min   ? Equipment Utilized During Treatment Gait belt   ? Activity Tolerance Patient tolerated treatment well   ? Behavior During Therapy [facility] for tasks assessed/performed   Pt is lethargic today  ? ?  ?  ? ?  ? ? ? ?Past Medical History:  ?Diagnosis Date  ? Hyperlipidemia   ? Hypertension   ? Pneumonia   ? Thyroid disease   ? ?Past Surgical History:  ?Procedure Laterality Date  ? ABDOMINAL HYSTERECTOMY  1983  ? APPLICATION OF CRANIAL NAVIGATION Right 07/06/2021  ? Procedure: APPLICATION OF CRANIAL NAVIGATION;  Surgeon: Dawley, Theodoro Doing, DO;  Location: [place];  Service: Neurosurgery;  Laterality: Right;  ? CRANIOTOMY Right 07/06/2021  ? Procedure: CRANIOTOMY TUMOR EXCISION;  Surgeon: Karsten Ro, DO;  Location: [place];  Service: Neurosurgery;  Laterality: Right;  ? TONSILLECTOMY  1965  ? TOOTH EXTRACTION    ? ?Patient Active Problem List  ? Diagnosis Date Noted  ? Goals of care, counseling/discussion 07/20/2021  ? 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 Brain mass 06/30/2021  ? Hypothyroidism 06/30/2021  ? Hypokalemia 06/30/2021  ? Essential hypertension 05/25/2021  ? Hyperglycemia 07/14/2011  ? Normocytic anemia 07/13/2011  ? ? ?REFERRING DIAG: D49.6 (ICD-10-CM) - Brain tumor ([place])  ? ?THERAPY DIAG:  ?Other lack of coordination ? ?Muscle weakness (generalized) ? ?Unsteadiness on feet ? ?PERTINENT HISTORY: HTN, hypothyroidism, hyperglycemia, normocytic anemia, glioblastoma with isocitrate dehydrogenase gene  wildtype ? ?PRECAUTIONS: Fall ? ?SUBJECTIVE: Daughter states that pt has become increasingly unsteady since finishing radiation last Friday 09/15/2021.  Pt has had intermittent stumbling requiring inc hands on assist from children when home. ? ?PAIN:  ?Are you having pain? No ?VITALS:  R BP assessed in sitting prior to activity 105/73, HR 87bpm ? ?TODAY'S TREATMENT: ?Pt ambulates 340' in gym on level surface with constant cues for heel strike, L foot clearance and step through with the LLE.  Pt demos L inattention bumping into chair despite therapist cuing to avoid. ?BP assessed following gait due to pt reported dizziness:  109/88, HR 100bpm (pt talking). ?Attempt to assess gait w/ SPC w/ quad tip x10' to address daughter's concern for home, pt unable to sequence and maintain proper foot width and demonstrates inc visual reliance, trunk forward flexion and narrowed BOS particularly during turning.  Unable to sequence without one step commands. ?Pt demos inc lethargy during session as well as dec initiation and freezing during gait requiring PT to inquire about wellbeing.   ?Regressed session to sitting due to functional status.  ?In sitting EOM:  LAQ w/ 2# ankle weight 2x10, seated marching 2x10, seated calf raises on step w/ 2# ankle weights 2x10-cued to drop heels from back of step to eccentrically load mm, seated punches w/ 2# dumbbells2x10 each UE-cued to correct form at shoulders with improved core activation in upright sitting to prevent posterior lean ?BP assessed RUE in sitting End of session:  107/73, HR 90 bpm ? ? ? ? ?PATIENT EDUCATION: ?Education details: Edu on potential use of cane in order to fit AD device in house as pt has been increasingly unsteady since radiation stopped.  Edu to continue use of RW for safety.  Discussed 2x/wk frequency bump from eval, daughter wants to continue as is, but understands if lethargy persists she may not be able to tolerate. ?Person educated: Patient,  daughter ?Education method: Explanation ?Education comprehension: verbalized understanding ? ?HEP: ?Access Code: [email or web address] ?Initiated walking program 09/01/2021 with supervision in home environment/neighborhood on level paved surface w/ or w/o AD for endurance. ? ?OBJECTIVE:  ?  ?DIAGNOSTIC FINDINGS: Pt had MRI prior to surgical resection of tumor ?  ?COGNITION: ?Overall cognitive status: Within functional limits for tasks assessed ?  ?            ?SENSATION: ?Light touch: Appears intact ?Hot/Cold: Appears intact-pt reports no difficulty noting difference in shower and when washing hands ?  ?COORDINATION: ?Fingertip opposition: normal bilaterally ?Heel-to-shin:  normal bilaterally; inc time ?  ?EDEMA:  ?Mild bilateral LEs-pt states she has had this for a while now. ?  ?MUSCLE TONE: None noted in LE ?  ?POSTURE: rounded shoulders, forward head, and increased thoracic kyphosis ?  ?AROM/PROM:   ?  ?AROM Right ?08/11/2021 Left ?08/11/2021  ?Hip flexion [facility] WFL  ?Hip extension " "  ?Hip abduction " "  ?Hip adduction " "  ?Hip internal rotation " "  ?Hip external rotation " "  ?Knee flexion " "  ?Knee extension " "  ?Ankle dorsiflexion " "  ?Ankle plantarflexion " "  ? (Blank rows = not tested) ?MMT: ?Left shoulder 4-/5 ?MMT Right ?08/11/2021 Left ?08/11/2021  ?Hip flexion 4/5 3/5  ?Hip extension      ?Hip abduction 4/5 4/5  ?Hip adduction 5/5 5/5  ?Hip internal rotation      ?Hip external rotation      ?Knee flexion 4-/5 3+/5  ?Knee extension 4+/5 4+/5  ?Ankle dorsiflexion 5/5 4+/5; no pain  ?Ankle plantarflexion      ?Ankle inversion      ?Ankle eversion      ?(Blank rows = not tested) ?  ?TRANSFERS: ?Assistive device utilized: Environmental consultant - 2 wheeled  ?Sit to stand: Modified independence ?Stand to sit: Modified independence ?  ?STAIRS: 6", 4 step up using bilateral rails, step-to pattern, up with LLE, down with RLE.  Edu on safe stair negotiation, "up with the good, down with the bad" to prevent falls on steps into home.   ?   ?GAIT: ?Gait pattern: step to pattern, decreased stride length, and trunk flexed ?Distance walked: Several bouts in and around gym for balance and fall risk assessments. ?Assistive device utilized: Environmental consultant - 2 wheeled ?Level of assistance: SBA ?  ?FUNCTIONAL TESTs:  ?5 times sit to stand: 15.91 sec ?Timed up and go (TUG): 19.53 sec w/ RW ?10 meter walk test: (normal)=0.56 m/sec or 1.16f/sec; (Fast)=.77 m/sec or 2.56 ft/sec  w/ RW  ?  ?ASSESSMENT: ?  ?CLINICAL IMPRESSION: ?Pt is increasingly lethargic in session requiring regression to seated therex.  Discussed continuing 2x/wk frequency w/ daughter and pt, will proceed as planned.  Pt would benefit from further PT to address exercise tolerance as able.  Continue per POC. ?  ?  ?  ?OBJECTIVE IMPAIRMENTS Abnormal gait, decreased activity tolerance, decreased balance, decreased endurance, decreased mobility, difficulty walking, decreased strength, and increased edema.  ?  ?ACTIVITY LIMITATIONS cleaning, community activity, driving, meal  prep, occupation, laundry, yard work, and shopping.  ?  ?PERSONAL FACTORS HTN, hypothyroidism, hyperglycemia are also affecting patient's functional outcome.  ?  ?  ?REHAB POTENTIAL: Good ?  ?CLINICAL DECISION MAKING: Evolving/moderate complexity ?  ?EVALUATION COMPLEXITY: Moderate ?  ?  ?GOALS: ?Goals reviewed with patient? Yes ?  ?SHORT TERM GOALS: ?  ?Pt will be independent with strength and balance HEP with supervision from family. ?Baseline: to be initiated at second visit ?Target date: 09/15/2021 ?Goal status: MET ?  ?2.  Pt will decrease 5xSTS to <13 seconds in order to demonstrate decreased risk for falls and improved functional bilateral LE strength and power. ?Baseline: 15.91 sec; 09/15/2021 12.10 seconds ?Target date: 09/15/2021 ?Goal status: MET ?  ?3.  Pt will demonstrate TUG of <16 seconds in order to decrease risk of falls and improve functional mobility using LRAD. ?Baseline: 19.53 seconds; 23.58 sec 09/15/2021 ?Target date:  09/15/2021 ?Goal status: NOT MET ?  ?4.  Pt will demonstrate a gait speed of >2 feet/sec at normal pace w/ LRAD in order to decrease risk for falls.  ?Baseline: 1.86 ft/sec w/ RW; 2.39 ft/sec w/o AD requiring CGA ?Target date: 09/15/2021 ?Goal status: ME

## 2021-09-22 NOTE — Therapy (Signed)
[place] [address] ?[place], [place], [address] ?Phone: [phone]   Fax:  [phone] ? ?Speech Language Pathology Treatment ? ?Patient Details  ?Name: Denise Wright ?MRN: [id number] ?Date of Birth: 04-Nov-1950 ?Referring Provider (SLP): Lauraine Rinne, PA-C ? ? ?Encounter Date: 09/22/2021 ? ? End of Session - 09/22/21 1315   ? ? Visit Number 7   ? Number of Visits 25   ? Date for SLP Re-Evaluation 11/09/21   ? Authorization Type BCBS   ? SLP Start Time 0258   ? SLP Stop Time  1445   ? SLP Time Calculation (min) 41 min   ? Activity Tolerance Patient limited by lethargy;Patient limited by fatigue   ? ?  ?  ? ?  ? ? ?Past Medical History:  ?Diagnosis Date  ? Hyperlipidemia   ? Hypertension   ? Pneumonia   ? Thyroid disease   ? ? ?Past Surgical History:  ?Procedure Laterality Date  ? ABDOMINAL HYSTERECTOMY  1983  ? APPLICATION OF CRANIAL NAVIGATION Right 07/06/2021  ? Procedure: APPLICATION OF CRANIAL NAVIGATION;  Surgeon: Dawley, Theodoro Doing, DO;  Location: [place];  Service: Neurosurgery;  Laterality: Right;  ? CRANIOTOMY Right 07/06/2021  ? Procedure: CRANIOTOMY TUMOR EXCISION;  Surgeon: Karsten Ro, DO;  Location: [place];  Service: Neurosurgery;  Laterality: Right;  ? TONSILLECTOMY  1965  ? TOOTH EXTRACTION    ? ? ?There were no vitals filed for this visit. ? ? Subjective Assessment - 09/22/21 1357   ? ? Subjective "I"m better"   ? Patient is accompained by: Family member   ? Currently in Pain? No/denies   ? ?  ?  ? ?  ? ? ? ? ? ? ? ? ADULT SLP TREATMENT - 09/22/21 1315   ? ?  ? General Information  ? Behavior/Cognition Alert;Cooperative;Pleasant mood;Lethargic;Requires cueing   ?  ? Treatment Provided  ? Treatment provided Cognitive-Linquistic   ?  ? Cognitive-Linquistic Treatment  ? Treatment focused on Cognition;Patient/family/caregiver education   ? Skilled Treatment 1 week s/p ending radiation tx with limited improvements in fatigue and life  participation activities reported. SLP targeted functional problem solving to heighten pt's reduced intellectual awareness of functional deficits. Daughter often prompted patient and reported changes such as delayed processing speed and reduced initation. Increased prompting required to prioritize and complete necessary tasks, such as finishing tax forms. SLP generated strategies to aid attention, energy conservation, and increased participation in cognitive lingusitic tasks (see handout). Despite fatigue, pt exhibited good recall and attention to conversation even with eye closed.   ?  ? Assessment / Recommendations / Plan  ? Plan Continue with current plan of care   ?  ? Progression Toward Goals  ? Progression toward goals Progressing toward goals   slow progress  ? ?  ?  ? ?  ? ? ? SLP Education - 09/22/21 1510   ? ? Education Details energy conservation, attention strategies   ? Person(s) Educated Patient;Child(ren)   ? Methods Explanation;Demonstration;Verbal cues;Handout   ? Comprehension Verbalized understanding;Returned demonstration;Need further instruction   ? ?  ?  ? ?  ? ? ? SLP Short Term Goals - 09/22/21 1510   ? ?  ? SLP SHORT TERM GOAL #1  ? Title pt will complete standardized testing   ? Status Achieved   ? Target Date 08/25/21   ?  ? SLP SHORT TERM GOAL #2  ? Title Pt will verbalize 3 attention/memory compensations  for making/tracking appointments, managing meds, and other daily tasks in 2 sessions   ? Baseline 08-23-21   ? Status Partially Met   ? Target Date 09/08/21   ?  ? SLP SHORT TERM GOAL #3  ? Title pt will verbally generate and carryover (with min A) solutions to problems encountered in everyday living in 2 sessions   ? Baseline 08-23-21   ? Status Partially Met   ? Target Date 09/22/21   ?  ? SLP SHORT TERM GOAL #4  ? Title pt will verbally state errors on mod complex written or verbal tasks with nonverbal cues   ? Baseline 08-23-21   ? Status Partially Met   ? Target Date 09/22/21   ? ?  ?   ? ?  ? ? ? SLP Long Term Goals - 09/22/21 1315   ? ?  ? SLP LONG TERM GOAL #1  ? Title Pt will verbalize AND IMPLEMEMENT 3 attention/memory compensations for making/tracking appointments, managing meds, and other daily tasks in 4 sessions   ? Time 3   ? Period Weeks   ? Status On-going   ? Target Date 10/06/21   ?  ? SLP LONG TERM GOAL #2  ? Title Pt will score higher on a patient-reported outcome measure in the last 1-2 visits than when taken in the first 2 sessions   ? Time 7   ? Period Weeks   ? Status On-going   ? Target Date 11/09/21   ?  ? SLP LONG TERM GOAL #3  ? Title pt will verbally generate and independently carryover solutions to problems encountered in everyday living with min A in 4 sessions   ? Time 7   ? Period Weeks   ? Status On-going   ? Target Date 11/09/21   ?  ? SLP LONG TERM GOAL #4  ? Title pt will note verbal or written errors in work-like tasks independently for improving verbal and/or written accuracy   ? Time 7   ? Period Weeks   ? Status On-going   ? Target Date 11/09/21   ? ?  ?  ? ?  ? ? ? Plan - 09/22/21 1315   ? ? 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 Session limited by increased lethargy and fatigue; focused session on caregiver education and instruction for implementation of energy conservation, memory/attention compensations, and functional cognitive linguistic tasks to target at home to maximize outcomes. Her performance in some of these areas may decr as she progresses through chemo/rad tx due to temporary physiological changes to the brain. She will benefit from skilled ST targeting these deficits.   ? Speech Therapy Frequency 2x / week   ? Duration 12 weeks   pt may schedule x1/week for 6 weeks due to chemo/rad schedule  ? Treatment/Interventions Cognitive reorganization;Environmental controls;Internal/external  aids;Compensatory strategies;Patient/family education;Functional tasks;Cueing hierarchy   ? Potential to Achieve Goals Good   ? Consulted and Agree with Plan of Care Patient   ? ?  ?  ? ?  ? ? ?Patient will benefit from skilled therapeutic intervention in order to improve the following deficits and impairments:   ?Cognitive communication deficit ? ? ? ?Problem List ?Patient Active Problem List  ? Diagnosis Date Noted  ? Goals of care, counseling/discussion 07/20/2021  ? 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 Brain mass 06/30/2021  ?  Hypothyroidism 06/30/2021  ? Hypokalemia 06/30/2021  ? Essential hypertension 05/25/2021  ? Hyperglycemia 07/14/2011  ? Normocytic anemia 07/13/2011  ? ? ?Marzetta Board, CCC-SLP ?09/22/2021, 3:11 PM ? ?[place] [address] ?[place], [place], [address] ?Phone: [phone]   Fax:  [phone] ? ? ?Name: Denise Wright ?MRN: [id number] ?Date of Birth: [birth year] ? ?

## 2021-09-27 ENCOUNTER — Ambulatory Visit: Payer: BC Managed Care – PPO | Admitting: Physical Therapy

## 2021-09-27 ENCOUNTER — Telehealth: Payer: Self-pay

## 2021-09-27 ENCOUNTER — Ambulatory Visit: Payer: BC Managed Care – PPO

## 2021-09-27 ENCOUNTER — Other Ambulatory Visit: Payer: Self-pay | Admitting: Radiation Therapy

## 2021-09-27 DIAGNOSIS — R41841 Cognitive communication deficit: Secondary | ICD-10-CM

## 2021-09-27 DIAGNOSIS — R2689 Other abnormalities of gait and mobility: Secondary | ICD-10-CM | POA: Diagnosis not present

## 2021-09-27 NOTE — Telephone Encounter (Signed)
Patient's daughter reports pt fell today getting out of bed, "knees weak." No injuries.Patient also reporting mouth sores that started on 4/16. Provided phone number to central scheduling for daughter to schedule MRI with expected date of 10/06/21. ?

## 2021-09-27 NOTE — Therapy (Signed)
[facility] ?[place] ?[place], [place], [address] ?Phone: [phone]   Fax:  [phone] ? ?Speech Language Pathology Treatment ? ?Patient Details  ?Name: Denise [place] ?MRN: [id number] ?Date of Birth: [birth year] ?Referring Provider (SLP): Denise Rinne, PA-C ? ? ?Encounter Date: 09/27/2021 ? ? End of Session - 09/27/21 1140   ? ? Visit Number 8   ? Number of Visits 25   ? Date for SLP Re-Evaluation 11/09/21   ? Authorization Type BCBS   ? SLP Start Time 1149   ? SLP Stop Time  1230   ? SLP Time Calculation (min) 41 min   ? Activity Tolerance Patient tolerated treatment well   ? ?  ?  ? ?  ? ? ?Past Medical History:  ?Diagnosis Date  ? Hyperlipidemia   ? Hypertension   ? Pneumonia   ? Thyroid disease   ? ? ?Past Surgical History:  ?Procedure Laterality Date  ? ABDOMINAL HYSTERECTOMY  1983  ? APPLICATION OF CRANIAL NAVIGATION Right 07/06/2021  ? Procedure: APPLICATION OF CRANIAL NAVIGATION;  Surgeon: Denise, Theodoro Doing, DO;  Location: [facility];  Service: Neurosurgery;  Laterality: Right;  ? CRANIOTOMY Right 07/06/2021  ? Procedure: CRANIOTOMY TUMOR EXCISION;  Surgeon: Denise Ro, DO;  Location: [facility];  Service: Neurosurgery;  Laterality: Right;  ? TONSILLECTOMY  1965  ? TOOTH EXTRACTION    ? ? ?There were no vitals filed for this visit. ? ? Subjective Assessment - 09/27/21 1144   ? ? Subjective "nothing new. nothing different"   ? Currently in Pain? No/denies   ? ?  ?  ? ?  ? ? ? ? ? ? ? ? ADULT SLP TREATMENT - 09/27/21 1139   ? ?  ? General Information  ? Behavior/Cognition Alert;Cooperative;Pleasant mood;Lethargic;Requires cueing   ?  ? Treatment Provided  ? Treatment provided Cognitive-Linquistic   ?  ? Cognitive-Linquistic Treatment  ? Treatment focused on Cognition;Patient/family/caregiver education   ? Skilled Treatment Ongoing fatigue and lethargy reported at home. Pt did complete majority of HEP, which was done independently. Pt averaged ~50%  accuracy on sequencing and attention to detail tasks. For cued double check, pt able to identify and correct errors with 95% accuracy. SLP targeted patient generating solutions to aid recall and attention to improve performance, in which pt stated "better organization." Confabulation present as pt endorsed need to eliminate multitasking (reported watching basketball game and visiting with friends during homework). Daughter indicated this was untrue as pt completed task in quiet environment alone. Reduced recall and awareness demonstrated. For re-attempt to generate appropriate strategy, pt endorsed need to double check. SLP also educated use of repetition and visual guides to aid recall and attention. Handout provided. Pt able to immediately recall targeted strategies with mod A.   ?  ? Assessment / Recommendations / Plan  ? Plan Continue with current plan of care   ?  ? Progression Toward Goals  ? Progression toward goals Progressing toward goals   slow progression  ? ?  ?  ? ?  ? ? ? SLP Education - 09/27/21 1238   ? ? Education Details see patient instructions   ? Person(s) Educated Patient;Child(ren)   ? Methods Explanation;Demonstration;Verbal cues;Handout   ? Comprehension Verbalized understanding;Returned demonstration;Need further instruction   ? ?  ?  ? ?  ? ? ? SLP Short Term Goals - 09/22/21 1510   ? ?  ? SLP SHORT TERM GOAL #1  ? Title  pt will complete standardized testing   ? Status Achieved   ? Target Date 08/25/21   ?  ? SLP SHORT TERM GOAL #2  ? Title Pt will verbalize 3 attention/memory compensations for making/tracking appointments, managing meds, and other daily tasks in 2 sessions   ? Baseline 08-23-21   ? Status Partially Met   ? Target Date 09/08/21   ?  ? SLP SHORT TERM GOAL #3  ? Title pt will verbally generate and carryover (with min A) solutions to problems encountered in everyday living in 2 sessions   ? Baseline 08-23-21   ? Status Partially Met   ? Target Date 09/22/21   ?  ? SLP SHORT  TERM GOAL #4  ? Title pt will verbally state errors on mod complex written or verbal tasks with nonverbal cues   ? Baseline 08-23-21   ? Status Partially Met   ? Target Date 09/22/21   ? ?  ?  ? ?  ? ? ? SLP Long Term Goals - 09/27/21 1140   ? ?  ? SLP LONG TERM GOAL #1  ? Title Pt will verbalize AND IMPLEMEMENT 3 attention/memory compensations for making/tracking appointments, managing meds, and other daily tasks in 4 sessions   ? Time 2   ? Period Weeks   ? Status On-going   ? Target Date 10/06/21   ?  ? SLP LONG TERM GOAL #2  ? Title Pt will score higher on a patient-reported outcome measure in the last 1-2 visits than when taken in the first 2 sessions   ? Time 6   ? Period Weeks   ? Status On-going   ? Target Date 11/09/21   ?  ? SLP LONG TERM GOAL #3  ? Title pt will verbally generate and independently carryover solutions to problems encountered in everyday living with min A in 4 sessions   ? Time 6   ? Period Weeks   ? Status On-going   ? Target Date 11/09/21   ?  ? SLP LONG TERM GOAL #4  ? Title pt will note verbal or written errors in work-like tasks independently for improving verbal and/or written accuracy   ? Time 6   ? Period Weeks   ? Status On-going   ? Target Date 11/09/21   ? ?  ?  ? ?  ? ? ? Plan - 09/27/21 1140   ? ? 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 Conducted education and instruction for implementation of energy conservation, memory/attention compensations, and functional cognitive linguistic tasks to target at home to maximize accuracy and performance at home. Her performance in some of these areas may decr as she progresses through chemo/rad tx due to temporary physiological changes to the brain. She will benefit from skilled ST targeting these deficits.   ? Speech Therapy Frequency 2x / week   ? Duration 12 weeks   pt may  schedule x1/week for 6 weeks due to chemo/rad schedule  ? Treatment/Interventions Cognitive reorganization;Environmental controls;Internal/external aids;Compensatory strategies;Patient/family education;Functional tasks;Cueing hierarchy   ? Potential to Achieve Goals Good   ? Consulted and Agree with Plan of Care Patient   ? ?  ?  ? ?  ? ? ?Patient will benefit from skilled therapeutic intervention in order to improve the following deficits and impairments:   ?Cognitive communication deficit ? ? ? ?Problem List ?Patient Active Problem List  ?  Diagnosis Date Noted  ? Goals of care, counseling/discussion 07/20/2021  ? 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[facility]) 07/12/2021  ? Brain mass 06/30/2021  ? Hypothyroidism 06/30/2021  ? Hypokalemia 06/30/2021  ? Essential hypertension 05/25/2021  ? Hyperglycemia 07/14/2011  ? Normocytic anemia 07/13/2011  ? ? ?Marzetta Board, CCC-SLP ?09/27/2021, 12:47 PM ? ?[facility] ?[place] ?[place], [place], [address] ?Phone: [phone]   Fax:  [phone] ? ? ?Name: [place] ?MRN: [id number] ?Date of Birth: 03-22-1951 ? ?

## 2021-09-27 NOTE — Patient Instructions (Addendum)
Key Takeaways:  ?Always double check your work  ?Minimize multitask (one task a time)  ?Use a guide to keep you on track and focused  ?Read instructions at least twice (repetition is good for memory) ?You can have family be the second double check (you need to double check it first). They can alert you if they see any errors (they won't be too specific in order to make your brain work for it)  ?Outline your ideas for your speech on paper  ? ?

## 2021-09-28 ENCOUNTER — Inpatient Hospital Stay: Payer: BC Managed Care – PPO

## 2021-09-28 ENCOUNTER — Inpatient Hospital Stay: Payer: BC Managed Care – PPO | Admitting: Internal Medicine

## 2021-09-29 ENCOUNTER — Encounter: Payer: Self-pay | Admitting: Physical Therapy

## 2021-09-29 ENCOUNTER — Telehealth: Payer: Self-pay | Admitting: Internal Medicine

## 2021-09-29 ENCOUNTER — Ambulatory Visit: Payer: BC Managed Care – PPO | Admitting: Speech Pathology

## 2021-09-29 ENCOUNTER — Ambulatory Visit: Payer: BC Managed Care – PPO | Admitting: Physical Therapy

## 2021-09-29 DIAGNOSIS — M6281 Muscle weakness (generalized): Secondary | ICD-10-CM

## 2021-09-29 DIAGNOSIS — R278 Other lack of coordination: Secondary | ICD-10-CM

## 2021-09-29 DIAGNOSIS — R2689 Other abnormalities of gait and mobility: Secondary | ICD-10-CM

## 2021-09-29 DIAGNOSIS — R41841 Cognitive communication deficit: Secondary | ICD-10-CM

## 2021-09-29 DIAGNOSIS — R2681 Unsteadiness on feet: Secondary | ICD-10-CM

## 2021-09-29 NOTE — Therapy (Signed)
[facility] ?[place] ?[place], [place], [address] ?Phone: [phone]   Fax:  [phone] ? ?Speech Language Pathology Treatment ? ?Patient Details  ?Name: Den[place] ?MRN: [id number] ?Date of Birth: 1950/11/09 ?Referring Provider (SLP): Lauraine Rinne, PA-C ? ? ?Encounter Date: 09/29/2021 ? ? End of Session - 09/29/21 1234   ? ? Visit Number 9   ? Number of Visits 25   ? Date for SLP Re-Evaluation 11/09/21   ? Authorization Type BCBS   ? SLP Start Time 1234   ? SLP Stop Time  1314   ? SLP Time Calculation (min) 40 min   ? Activity Tolerance Patient tolerated treatment well   ? ?  ?  ? ?  ? ? ?Past Medical History:  ?Diagnosis Date  ? Hyperlipidemia   ? Hypertension   ? Pneumonia   ? Thyroid disease   ? ? ?Past Surgical History:  ?Procedure Laterality Date  ? ABDOMINAL HYSTERECTOMY  1983  ? APPLICATION OF CRANIAL NAVIGATION Right 07/06/2021  ? Procedure: APPLICATION OF CRANIAL NAVIGATION;  Surgeon: Dawley, Theodoro Doing, DO;  Location: [facility];  Service: Neurosurgery;  Laterality: Right;  ? CRANIOTOMY Right 07/06/2021  ? Procedure: CRANIOTOMY TUMOR EXCISION;  Surgeon: Karsten Ro, DO;  Location: [facility];  Service: Neurosurgery;  Laterality: Right;  ? TONSILLECTOMY  1965  ? TOOTH EXTRACTION    ? ? ?There were no vitals filed for this visit. ? ? Subjective Assessment - 09/29/21 1238   ? ? 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 Patient is accompained by: Family member   ? Currently in Pain? No/denies   ? ?  ?  ? ?  ? ? ? ? ? ? ? ? ADULT SLP TREATMENT - 09/29/21 0001   ? ?  ? General Information  ? Behavior/Cognition Alert;Cooperative;Pleasant mood;Lethargic;Requires cueing   ?  ? Treatment Provided  ? Treatment provided Cognitive-Linquistic   ?  ? Cognitive-Linquistic Treatment  ? Treatment focused on Cognition;Patient/family/caregiver education   ? Skilled Treatment Reviewed HEP in which pt demonstrates usual errors and difficulty ID  given cue to double check work. ST led pt through reasoning and problem solving solutions to noted errors. Given coaching on re-reading instructions x2 and double checking work on task with mutiple errors, pt able to correct x7 errors with occasional mod to max A. Led pt through teach back of pervious strategies taught with mod-A required. ID external distractors in Pt's enviornment in which she could alter for ocmplex task completion.   ?  ? Assessment / Recommendations / Plan  ? Plan Continue with current plan of care   ?  ? Progression Toward Goals  ? Progression toward goals Progressing toward goals   ? ?  ?  ? ?  ? ? ? ? ? SLP Short Term Goals - 09/29/21 1234   ? ?  ? SLP SHORT TERM GOAL #1  ? Title pt will complete standardized testing   ? Status Achieved   ? Target Date 08/25/21   ?  ? SLP SHORT TERM GOAL #2  ? Title Pt will verbalize 3 attention/memory compensations for making/tracking appointments, managing meds, and other daily tasks in 2 sessions   ? Baseline 08-23-21   ? Status Partially Met   ? Target Date 09/08/21   ?  ? SLP SHORT TERM GOAL #3  ? Title pt will verbally generate and carryover (with min A) solutions to problems encountered  in everyday living in 2 sessions   ? Baseline 08-23-21   ? Status Partially Met   ? Target Date 09/22/21   ?  ? SLP SHORT TERM GOAL #4  ? Title pt will verbally state errors on mod complex written or verbal tasks with nonverbal cues   ? Baseline 08-23-21   ? Status Partially Met   ? Target Date 09/22/21   ? ?  ?  ? ?  ? ? ? SLP Long Term Goals - 09/29/21 1234   ? ?  ? SLP LONG TERM GOAL #1  ? Title Pt will verbalize AND IMPLEMEMENT 3 attention/memory compensations for making/tracking appointments, managing meds, and other daily tasks in 4 sessions   ? Time 2   ? Period Weeks   ? Status On-going   ?  ? SLP LONG TERM GOAL #2  ? Title Pt will score higher on a patient-reported outcome measure in the last 1-2 visits than when taken in the first 2 sessions   ? Time 6   ?  Period Weeks   ? Status On-going   ?  ? SLP LONG TERM GOAL #3  ? Title pt will verbally generate and independently carryover solutions to problems encountered in everyday living with min A in 4 sessions   ? Time 6   ? Period Weeks   ? Status On-going   ?  ? SLP LONG TERM GOAL #4  ? Title pt will note verbal or written errors in work-like tasks independently for improving verbal and/or written accuracy   ? Time 6   ? Period Weeks   ? Status On-going   ? ?  ?  ? ?  ? ? ? ? ?Patient will benefit from skilled therapeutic intervention in order to improve the following deficits and impairments:   ?Cognitive communication deficit ? ? ? ?Problem List ?Patient Active Problem List  ? Diagnosis Date Noted  ? Goals of care, counseling/discussion 07/20/2021  ? 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[facility]) 07/12/2021  ? Brain mass 06/30/2021  ? Hypothyroidism 06/30/2021  ? Hypokalemia 06/30/2021  ? Essential hypertension 05/25/2021  ? Hyperglycemia 07/14/2011  ? Normocytic anemia 07/13/2011  ? ? ?Jessica L Thomas, CCC-SLP ?09/29/2021, 1:18 PM ? ?[facility] ?[address] ?[place], [place], [address] ?Phone: [phone]   Fax:  [phone] ? ? ?Name: Den[place] ?MRN: [id number] ?Date of Birth: 01/07/1951 ? ?

## 2021-09-29 NOTE — Telephone Encounter (Signed)
.  Called patient to schedule appointment per 4/20 inbasket, patient is aware of date and time.   ?

## 2021-09-29 NOTE — Therapy (Signed)
?OUTPATIENT PHYSICAL THERAPY TREATMENT NOTE ? ? ?Patient Name: Denise [place] ?MRN: [id number] ?DOB:06-Feb-1951, 71 y.o., female ?Today's Date: 09/29/2021 ? ?PCP: Willey Blade, MD ?REFERRING PROVIDER: Cathlyn Parsons, PA-C ? ? PT End of Session - 09/29/21 1322   ? ? Visit Number 8   ? Number of Visits 15   ? Date for PT Re-Evaluation 10/20/21   ? Progress Note Due on Visit 10   ? PT Start Time 1318   ? PT Stop Time 1402   ? PT Time Calculation (min) 44 min   ? Equipment Utilized During Treatment Gait belt   ? Activity Tolerance Patient tolerated treatment well   ? Behavior During Therapy [facility] for tasks assessed/performed   Pt is lethargic today  ? ?  ?  ? ?  ? ? ? ?Past Medical History:  ?Diagnosis Date  ? Hyperlipidemia   ? Hypertension   ? Pneumonia   ? Thyroid disease   ? ?Past Surgical History:  ?Procedure Laterality Date  ? ABDOMINAL HYSTERECTOMY  1983  ? APPLICATION OF CRANIAL NAVIGATION Right 07/06/2021  ? Procedure: APPLICATION OF CRANIAL NAVIGATION;  Surgeon: Dawley, Theodoro Doing, DO;  Location: [facility];  Service: Neurosurgery;  Laterality: Right;  ? CRANIOTOMY Right 07/06/2021  ? Procedure: CRANIOTOMY TUMOR EXCISION;  Surgeon: Karsten Ro, DO;  Location: [facility];  Service: Neurosurgery;  Laterality: Right;  ? TONSILLECTOMY  1965  ? TOOTH EXTRACTION    ? ?Patient Active Problem List  ? Diagnosis Date Noted  ? Goals of care, counseling/discussion 07/20/2021  ? 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[facility]) 07/12/2021  ? Brain mass 06/30/2021  ? Hypothyroidism 06/30/2021  ? Hypokalemia 06/30/2021  ? Essential hypertension 05/25/2021  ? Hyperglycemia 07/14/2011  ? Normocytic anemia 07/13/2011  ? ? ?REFERRING DIAG: D49.6 (ICD-10-CM) - Brain tumor ([place])  ? ?THERAPY DIAG:  ?Muscle weakness (generalized) ? ?Unsteadiness on feet ? ?Other abnormalities of gait and mobility ? ?Other lack of coordination ? ?PERTINENT HISTORY: HTN, hypothyroidism, hyperglycemia, normocytic anemia, glioblastoma with  isocitrate dehydrogenase gene wildtype ? ?PRECAUTIONS: Fall ? ?SUBJECTIVE: Pt slid out of bed onto step stool at foot of bed and had additional fall onto daughter's thighs and onto door jam of car on Wednesday 09/27/2021.  Pt did not hit head and was otherwise unharmed requiring 2 people to get her up.  Daughter reports that she is requiring inc physical help for transfers and walking at home and that she has become increasingly lethargic and cannot tolerate more than a couple of minutes of walking at a time.  She is now requiring 2 people to get into bed.  She has followup head MRI scheduled for 10/06/2021.  Daughter further reports that pt is increasingly neglectful of left hemibody and is refusing to engage left hand.  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  She has had a headache this week off and on. ? ?PAIN:  ?Are you having pain? Yes: NPRS scale: 6/10 ?Pain location: R side of head around incision ?Pain description: intermittent, pinpricks like "staples" ?Aggravating factors: N/A ?Relieving factors: motrin ?VITALS:  R BP assessed in sitting prior to activity 114/70 ?  Reassessed end of session 118/76 ?TODAY'S TREATMENT: ?Session initiated with conversation about recent pt falls and continued PT frequency with concerns for current functional status and recent decline at home.  See subjective for details. ?Pt demos bed mobility sit<>supine requiring mod-maxA for trunk and LE management and  is totalA to position hips in supine.   ?Attempted sit<>supine x2.  On attempt 2 once supine pt is minA to obtain hooklying with inc difficulty bending LLE.  Pt cued to bridge hips with constant one step commands and visual demo to move to center of bed to simulate positioning at home w/ minA for maintaining LLE placement.  Edu and demo to daughter to decrease caregiver burden when positioning pt at night.  Pt requires inc time to engage to task.  Pt able to roll to L  independently.  She is modA to roll back to supine prior to bridging to return to EOM.  Once close to [facility] therapist cues to reach using LUE w/ modA to obtain sidelying and hand over hand to promote pt maintaining sidelying using functional L grip on EOM.  Therapist facilitates push through LLE during roll as pt has decreased activation of LLE with task requiring maxA to move LE to side-lying and then EOM to hook.  Pt requires modA periscapular management and maxA LE management to return to sitting EOM.  Edu provided to daughter throughout to promote pt independence and functional strengthening at home by allowing inc time and cuing to have her engage to task.  Pt cued to scoot forward on EOM using facilitation at waist to weight shift off the moving side prior to sliding LE forward.  Pt able to complete independently with increased time. ?Pt requires short frequent rest breaks throughout. ? ? ? ? ?PATIENT EDUCATION: ?Education details: Edu on safety with upright mobility in home with assistance of daughter or other family member or return to use of RW.  Edu on regulation of sleep schedule using chair to sit upright during day with blinds open and noise around. ?Person educated: Patient, daughter ?Education method: Explanation ?Education comprehension: verbalized understanding ? ?HEP: ?Access Code: [email or web address] ?Initiated walking program 09/01/2021 with supervision in home environment/neighborhood on level paved surface w/ or w/o AD for endurance. ? ?OBJECTIVE:  ?  ?DIAGNOSTIC FINDINGS: Pt had MRI prior to surgical resection of tumor ?  ?COGNITION: ?Overall cognitive status: Within functional limits for tasks assessed ?  ?            ?SENSATION: ?Light touch: Appears intact ?Hot/Cold: Appears intact-pt reports no difficulty noting difference in shower and when washing hands ?  ?COORDINATION: ?Fingertip opposition: normal bilaterally ?Heel-to-shin:  normal bilaterally; inc time ?  ?EDEMA:  ?Mild bilateral LEs-pt states she  has had this for a while now. ?  ?MUSCLE TONE: None noted in LE ?  ?POSTURE: rounded shoulders, forward head, and increased thoracic kyphosis ?  ?AROM/PROM:   ?  ?AROM Right ?08/11/2021 Left ?08/11/2021  ?Hip flexion [facility] WFL  ?Hip extension " "  ?Hip abduction " "  ?Hip adduction " "  ?Hip internal rotation " "  ?Hip external rotation " "  ?Knee flexion " "  ?Knee extension " "  ?Ankle dorsiflexion " "  ?Ankle plantarflexion " "  ? (Blank rows = not tested) ?MMT: ?Left shoulder 4-/5 ?MMT Right ?08/11/2021 Left ?08/11/2021  ?Hip flexion 4/5 3/5  ?Hip extension      ?Hip abduction 4/5 4/5  ?Hip adduction 5/5 5/5  ?Hip internal rotation      ?Hip external rotation      ?Knee flexion 4-/5 3+/5  ?Knee extension 4+/5 4+/5  ?Ankle dorsiflexion 5/5 4+/5; no pain  ?Ankle plantarflexion      ?Ankle inversion      ?Ankle eversion      ?(  Blank rows = not tested) ?  ?TRANSFERS: ?Assistive device utilized: Environmental consultant - 2 wheeled  ?Sit to stand: Modified independence ?Stand to sit: Modified independence ?  ?STAIRS: 6", 4 step up using bilateral rails, step-to pattern, up with LLE, down with RLE.  Edu on safe stair negotiation, "up with the good, down with the bad" to prevent falls on steps into home.   ?  ?GAIT: ?Gait pattern: step to pattern, decreased stride length, and trunk flexed ?Distance walked: Several bouts in and around gym for balance and fall risk assessments. ?Assistive device utilized: Environmental consultant - 2 wheeled ?Level of assistance: SBA ?  ?FUNCTIONAL TESTs:  ?5 times sit to stand: 15.91 sec ?Timed up and go (TUG): 19.53 sec w/ RW ?10 meter walk test: (normal)=0.56 m/sec or 1.42f/sec; (Fast)=.77 m/sec or 2.56 ft/sec  w/ RW  ?  ?ASSESSMENT: ?  ?CLINICAL IMPRESSION: ?Pt appears to have been experiencing functional decline at home with session focused on addressing bed mobility to promote functional independence and maintained strength gains.  During session pt requires inc assist ranging from min-totalA for various bed mobility tasks.   Extensive edu and demo to daughter as primary caregiver to promote safety in home.  Continue with POC as medically able. ?  ?  ?  ?OBJECTIVE IMPAIRMENTS Abnormal gait, decreased activity tolerance, decreased

## 2021-10-02 ENCOUNTER — Other Ambulatory Visit: Payer: Self-pay

## 2021-10-02 ENCOUNTER — Inpatient Hospital Stay: Payer: BC Managed Care – PPO

## 2021-10-02 ENCOUNTER — Telehealth: Payer: Self-pay | Admitting: Internal Medicine

## 2021-10-02 ENCOUNTER — Inpatient Hospital Stay (HOSPITAL_BASED_OUTPATIENT_CLINIC_OR_DEPARTMENT_OTHER): Payer: BC Managed Care – PPO | Admitting: Internal Medicine

## 2021-10-02 VITALS — BP 113/67 | HR 73 | Temp 97.0°F | Resp 18 | Wt 210.1 lb

## 2021-10-02 DIAGNOSIS — R600 Localized edema: Secondary | ICD-10-CM | POA: Insufficient documentation

## 2021-10-02 DIAGNOSIS — C712 Malignant neoplasm of temporal lobe: Secondary | ICD-10-CM | POA: Insufficient documentation

## 2021-10-02 DIAGNOSIS — I10 Essential (primary) hypertension: Secondary | ICD-10-CM | POA: Insufficient documentation

## 2021-10-02 DIAGNOSIS — C719 Malignant neoplasm of brain, unspecified: Secondary | ICD-10-CM

## 2021-10-02 DIAGNOSIS — Z90710 Acquired absence of both cervix and uterus: Secondary | ICD-10-CM | POA: Diagnosis not present

## 2021-10-02 DIAGNOSIS — Z806 Family history of leukemia: Secondary | ICD-10-CM | POA: Insufficient documentation

## 2021-10-02 LAB — CBC WITH DIFFERENTIAL (CANCER CENTER ONLY)
Abs Immature Granulocytes: 0.02 10*3/uL (ref 0.00–0.07)
Basophils Absolute: 0 10*3/uL (ref 0.0–0.1)
Basophils Relative: 1 %
Eosinophils Absolute: 1.2 10*3/uL — ABNORMAL HIGH (ref 0.0–0.5)
Eosinophils Relative: 19 %
HCT: 33.9 % — ABNORMAL LOW (ref 36.0–46.0)
Hemoglobin: 11.2 g/dL — ABNORMAL LOW (ref 12.0–15.0)
Immature Granulocytes: 0 %
Lymphocytes Relative: 5 %
Lymphs Abs: 0.3 10*3/uL — ABNORMAL LOW (ref 0.7–4.0)
MCH: 31.2 pg (ref 26.0–34.0)
MCHC: 33 g/dL (ref 30.0–36.0)
MCV: 94.4 fL (ref 80.0–100.0)
Monocytes Absolute: 0.8 10*3/uL (ref 0.1–1.0)
Monocytes Relative: 14 %
Neutro Abs: 3.8 10*3/uL (ref 1.7–7.7)
Neutrophils Relative %: 61 %
Platelet Count: 162 10*3/uL (ref 150–400)
RBC: 3.59 MIL/uL — ABNORMAL LOW (ref 3.87–5.11)
RDW: 14 % (ref 11.5–15.5)
WBC Count: 6.2 10*3/uL (ref 4.0–10.5)
nRBC: 0 % (ref 0.0–0.2)

## 2021-10-02 LAB — CMP (CANCER CENTER ONLY)
ALT: 10 U/L (ref 0–44)
AST: 17 U/L (ref 15–41)
Albumin: 3.7 g/dL (ref 3.5–5.0)
Alkaline Phosphatase: 76 U/L (ref 38–126)
Anion gap: 10 (ref 5–15)
BUN: 34 mg/dL — ABNORMAL HIGH (ref 8–23)
CO2: 31 mmol/L (ref 22–32)
Calcium: 9.2 mg/dL (ref 8.9–10.3)
Chloride: 104 mmol/L (ref 98–111)
Creatinine: 2.27 mg/dL — ABNORMAL HIGH (ref 0.44–1.00)
GFR, Estimated: 23 mL/min — ABNORMAL LOW (ref >60)
Glucose, Bld: 119 mg/dL — ABNORMAL HIGH (ref 70–99)
Potassium: 3.3 mmol/L — ABNORMAL LOW (ref 3.5–5.1)
Sodium: 145 mmol/L (ref 135–145)
Total Bilirubin: 0.7 mg/dL (ref 0.3–1.2)
Total Protein: 6.5 g/dL (ref 6.5–8.1)

## 2021-10-02 MED ORDER — DEXAMETHASONE 4 MG PO TABS: 4.0000 mg | ORAL_TABLET | Freq: Every day | ORAL | 0 refills | Status: DC

## 2021-10-02 NOTE — Progress Notes (Signed)
? ?[facility] [address]  ?[place], [place] [address] ?[phone] ? ? ?Interval Evaluation ? ?Date of Service: 10/02/21 ?Patient Name: Denise Wright ?Patient MRN: [id number] ?Patient DOB: 1950-11-27 ?Provider: Ventura Sellers, MD ? ?Identifying Statement:  ?Denise Wright is a 71 y.o. female with right temporal glioblastoma  ? 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 Right temporal craniotomy, resection with Dr. Reatha Armour; path is glioblastoma IDH-1wt ?  ?07/12/2021 Initial Diagnosis  ? 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  ?08/07/2021 -  Chemotherapy  ? Patient is on Treatment Plan : BRAIN GLIOBLASTOMA Radiation Therapy With Concurrent Temozolomide 75 mg/m2 Daily Followed By Sequential Maintenance Temozolomide x 6-12 cycles  ? ?  ?  ? ? ?Biomarkers: ? ?MGMT Unknown.  ?IDH 1/2 Wild type.  ?EGFR Unknown  ?TERT Unknown  ? ?Interval History: ?Denise Wright presents today for follow up, given clinical changes.  Left arm and leg weakness are worse in the past couple of weeks.  She is relying on a wheelchair to get around the home, and using the left arm/hand minimally.  Leg edema persists, maybe slightly improved from last visit.  She has been dosing the lasix 37m daily.  Fatigue continues to be very persistent and problematic, some days sleeps as much as 20 hours.  Per daughter, very poor with hydration and PO intake in recent weeks.  Denies seizures or headaches today. ? ?H+P (07/20/21) Patient presented to medical attention last month with several days, weeks of progressive mental status changes.  Family noticed she was confused and disoriented over normal conversation and activities of daily living.  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  CNS imaging demonstrated an enhancing mass in the right  temporal lobe; she underwent craniotomy, resection with Dr. DReatha Armouron 07/06/21, which demonstrated glioblastoma.  Following surgery she experienced worsening left sided weakness, which prompted rehabilitation admission.  At present, she is doing some walking on her own, albeit slowly.  Left arm and hand have improved, she is starting occupational therapy this week. ? ?Medications: ?Current Outpatient Medications on File Prior to Visit  ?Medication Sig Dispense Refill  ? acetaminophen (TYLENOL) 325 MG tablet Take 2 tablets (650 mg total) by mouth every 6 (six) hours as needed for mild pain (or Fever >/= 101).    ? ALPRAZolam (XANAX) 0.25 MG tablet Take 1 tablet (0.25 mg total) by mouth 2 (two) times daily as needed for anxiety. 10 tablet 0  ? b complex vitamins capsule Take 1 capsule by mouth daily.    ? cholecalciferol (VITAMIN D) 1000 UNITS tablet Take 2,000 Units by mouth daily.    ? docusate sodium (COLACE) 100 MG capsule Take 1 capsule (100 mg total) by mouth 2 (two) times daily. 10 capsule 0  ? famotidine (PEPCID) 20 MG tablet Take 1 tablet (20 mg total) by mouth daily. 30 tablet 2  ? fluticasone (FLONASE) 50 MCG/ACT nasal spray Place 1 spray into both nostrils daily as needed for allergies. OTC    ? furosemide (LASIX) 40 MG tablet Take 1 tablet (40 mg total) by mouth daily. 30 tablet 1  ? hydrochlorothiazide (HYDRODIURIL) 12.5 MG tablet Take 0.5 tablets (6.25 mg total) by mouth daily. 30 tablet 0  ? levothyroxine (SYNTHROID) 50 MCG tablet Take 1 tablet (50 mcg total) by mouth daily. 30 tablet 0  ? Multiple  Vitamins-Minerals (MULTIVITAMIN WITH MINERALS) tablet Take 1 tablet by mouth daily.    ? NON FORMULARY Apply topically as needed. Magne Sport Balm ?Apply to skin as needed for joint/muscle pain    ? ondansetron (ZOFRAN) 8 MG tablet Take 1 tablet (8 mg total) by mouth 2 (two) times daily as needed (nausea and vomiting). May take 30-60 minutes prior to Temodar administration if nausea/vomiting occurs. 30 tablet  1  ? ?No current facility-administered medications on file prior to visit.  ? ? ?Allergies:  ?Allergies  ?Allergen Reactions  ? Fexofenadine Hives  ? Penicillins Itching and Other (See Comments)  ?  Pulling sensation in body  ? ?Past Medical History:  ?Past Medical History:  ?Diagnosis Date  ? Hyperlipidemia   ? Hypertension   ? Pneumonia   ? Thyroid disease   ? ?Past Surgical History:  ?Past Surgical History:  ?Procedure Laterality Date  ? ABDOMINAL HYSTERECTOMY  1983  ? APPLICATION OF CRANIAL NAVIGATION Right 07/06/2021  ? Procedure: APPLICATION OF CRANIAL NAVIGATION;  Surgeon: Dawley, Theodoro Doing, DO;  Location: [facility];  Service: Neurosurgery;  Laterality: Right;  ? CRANIOTOMY Right 07/06/2021  ? Procedure: CRANIOTOMY TUMOR EXCISION;  Surgeon: Karsten Ro, DO;  Location: [facility];  Service: Neurosurgery;  Laterality: Right;  ? TONSILLECTOMY  1965  ? TOOTH EXTRACTION    ? ?Social History:  ?Social History  ? ?Socioeconomic History  ? Marital status: Divorced  ?  Spouse name: Not on file  ? Number of children: 3  ? Years of education: 73  ? Highest education level: Not on file  ?Occupational History  ? Occupation: Masters degree in public service  ?  Employer: [employer]  ? Occupation: Working on Engineer, maintenance  ? Occupation: Training and development officer of [employer]  ?Tobacco Use  ? Smoking status: Never  ? Smokeless tobacco: Never  ?Vaping Use  ? Vaping Use: Never used  ?Substance and Sexual Activity  ? Alcohol use: No  ? Drug use: No  ? Sexual activity: Yes  ?  Birth control/protection: Surgical  ?Other Topics Concern  ? Not on file  ?Social History Narrative  ? Divorced, lives in [place] alone.  Independent of ADLs.  ? ?Social Determinants of Health  ? ?Financial Resource Strain: Not on file  ?Food Insecurity: Not on file  ?Transportation Needs: Not on file  ?Physical Activity: Not on file  ?Stress: Not on file  ?Social Connections: Not on file  ?Intimate Partner Violence: Not on  file  ? ?Family History:  ?Family History  ?Problem Relation Age of Onset  ? Diabetes Mother   ? Hypertension Mother   ? Hypertension Father   ? Leukemia Father   ? Stroke Father   ? Cancer Brother   ? Pneumonia Brother   ? ? ?Review of Systems: ?Constitutional: Doesn't report fevers, chills or abnormal weight loss ?Eyes: Doesn't report blurriness of vision ?Ears, nose, mouth, throat, and face: Doesn't report sore throat ?Respiratory: Doesn't report cough, dyspnea or wheezes ?Cardiovascular: Doesn't report palpitation, chest discomfort  ?Gastrointestinal:  Doesn't report nausea, constipation, diarrhea ?GU: Doesn't report incontinence ?Skin: Doesn't report skin rashes ?Neurological: Per HPI ?Musculoskeletal: Doesn't report joint pain ?Behavioral/Psych: Doesn't report anxiety ? ?Physical Exam: ?Vitals:  ? 10/02/21 1222  ?BP: 113/67  ?Pulse: 73  ?Resp: 18  ?Temp: (!) 97 ?F (36.1 ?C)  ?SpO2: 90%  ? ? ?KPS: 60 ?ECOG 1 ?General: Alert, cooperative, pleasant, in no acute distress ?Head: Normal ?EENT: No conjunctival  injection or scleral icterus.  ?Lungs: Resp effort normal ?Cardiac: Regular rate ?Abdomen: Non-distended abdomen ?Skin: Rash on truck, arms ?Extremities: 2+ dependent edema R>L ? ?Neurologic Exam: ?Mental Status: Awake, alert, attentive to examiner. Oriented to self and environment. Language is fluent with intact comprehension.  ?Cranial Nerves: Visual acuity is grossly normal. Visual fields are full. Extra-ocular movements intact. No ptosis. Face is symmetric ?Motor: Tone and bulk are normal. Power is 3/5 in left arm and leg, asterixis noted with posturing. Reflexes are symmetric, no pathologic reflexes present.  ?Sensory: Intact to light touch ?Gait: Non ambulatory ? ?Labs: ?I have reviewed the data as listed ?   ?Component Value Date/Time  ? NA 145 10/02/2021 1141  ? K 3.3 (L) 10/02/2021 1141  ? CL 104 10/02/2021 1141  ? CO2 31 10/02/2021 1141  ? GLUCOSE 119 (H) 10/02/2021 1141  ? BUN 34 (H) 10/02/2021 1141   ? CREATININE 2.27 (H) 10/02/2021 1141  ? CALCIUM 9.2 10/02/2021 1141  ? PROT 6.5 10/02/2021 1141  ? ALBUMIN 3.7 10/02/2021 1141  ? AST 17 10/02/2021 1141  ? ALT 10 10/02/2021 1141  ? ALKPHOS 76 10/02/2021 1

## 2021-10-02 NOTE — Telephone Encounter (Signed)
Per 4/24 los called pt about appointment.  Daughter confirmed appointment that was already scheduled  ?

## 2021-10-04 ENCOUNTER — Ambulatory Visit: Payer: BC Managed Care – PPO | Admitting: Physical Therapy

## 2021-10-04 ENCOUNTER — Ambulatory Visit: Payer: BC Managed Care – PPO

## 2021-10-06 ENCOUNTER — Ambulatory Visit: Payer: BC Managed Care – PPO

## 2021-10-06 ENCOUNTER — Ambulatory Visit (HOSPITAL_COMMUNITY)
Admission: RE | Admit: 2021-10-06 | Discharge: 2021-10-06 | Disposition: A | Discharge status: Home / Self Care | Payer: BC Managed Care – PPO | Source: Ambulatory Visit | Attending: Internal Medicine | Admitting: Internal Medicine

## 2021-10-06 ENCOUNTER — Encounter: Payer: Self-pay | Admitting: Physical Therapy

## 2021-10-06 ENCOUNTER — Ambulatory Visit: Payer: BC Managed Care – PPO | Admitting: Physical Therapy

## 2021-10-06 DIAGNOSIS — R278 Other lack of coordination: Secondary | ICD-10-CM

## 2021-10-06 DIAGNOSIS — C719 Malignant neoplasm of brain, unspecified: Secondary | ICD-10-CM

## 2021-10-06 DIAGNOSIS — R2689 Other abnormalities of gait and mobility: Secondary | ICD-10-CM | POA: Diagnosis not present

## 2021-10-06 DIAGNOSIS — R2681 Unsteadiness on feet: Secondary | ICD-10-CM

## 2021-10-06 DIAGNOSIS — M6281 Muscle weakness (generalized): Secondary | ICD-10-CM

## 2021-10-06 DIAGNOSIS — R41841 Cognitive communication deficit: Secondary | ICD-10-CM

## 2021-10-06 IMAGING — MR MR HEAD WO/W CM
17 series · 48 of 48 positions shown · IV contrast (gadavist)
Comparison: [DATE]

CLINICAL DATA: Brain/CNS neoplasm, assess treatment response.

EXAM:
MRI HEAD WITHOUT AND WITH CONTRAST
TECHNIQUE: Multiplanar, multiecho pulse sequences of the brain and surrounding
structures were obtained without and with intravenous contrast.
CONTRAST:  10mL GADAVIST GADOBUTROL 1 MMOL/ML IV SOLN

[Series 5: DWI · axial · 3.0mm · 1.36mm/px · z∈[-67,+71]mm · 5 of 96 slices shown (1 of 2)]
[im 1/96]
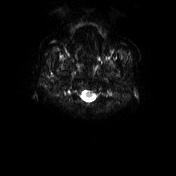
[im 24/96]
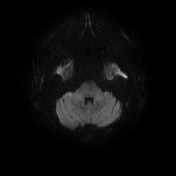
[im 48/96]
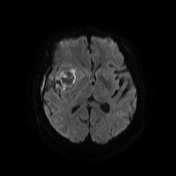
[im 72/96]
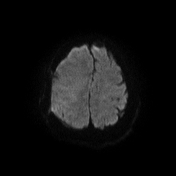
[im 96/96]
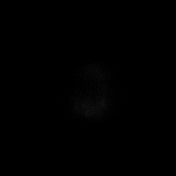

[Series 6: DWI · axial · 3.0mm · 1.36mm/px · z∈[-67,+71]mm · 3 of 48 slices shown (2 of 2)]
[im 1/48]
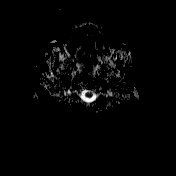
[im 24/48]
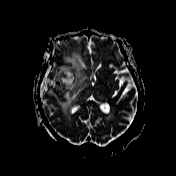
[im 48/48]
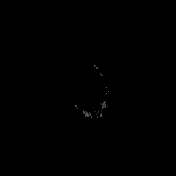

[Series 7: T1 · sagittal · 5.0mm · 0.75mm/px · 1 of 24 slices shown (1 of 4)]
[im 1/24]
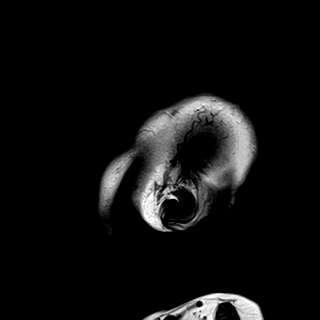

[Series 8: T2 · axial · 5.0mm · 0.62mm/px · 1 of 26 slices shown]
[im 1/26]
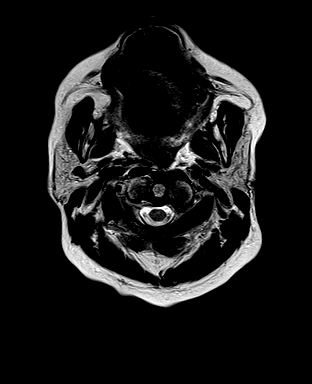

[Series 9: swi_images · axial · 3.0mm · 0.75mm/px · z∈[-67,+71]mm · 3 of 48 slices shown]
[im 1/48]
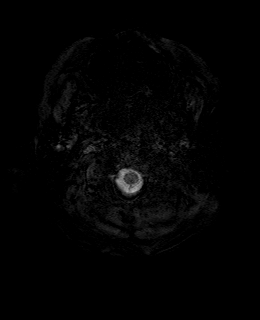
[im 24/48]
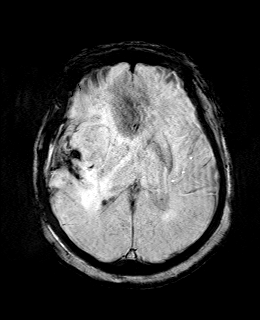
[im 48/48]
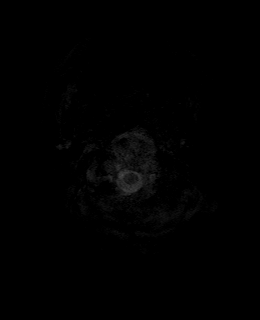

[Series 10: mip_images(sw) · axial · 24.0mm · 0.75mm/px · z∈[-57,+61]mm · 2 of 41 slices shown]
[im 1/41]
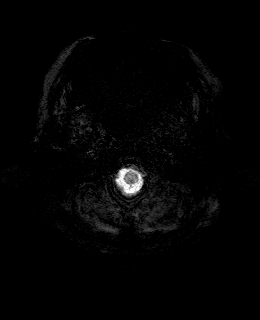
[im 41/41]
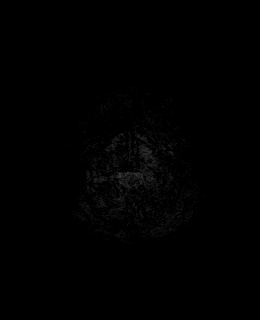

[Series 11: FLAIR · axial · 3.0mm · 0.75mm/px · z∈[-73,+77]mm · 3 of 52 slices shown]
[im 1/52]
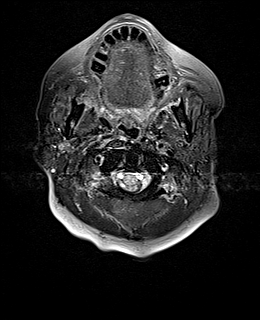
[im 26/52]
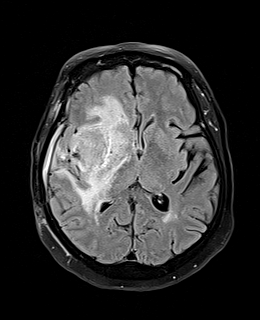
[im 52/52]
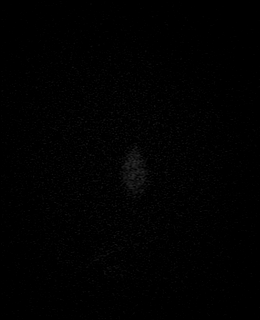

[Series 12: T1 · axial · 1.0mm · 0.94mm/px · z∈[-68,+72]mm · 8 of 144 slices shown (2 of 4)]
[im 1/144]
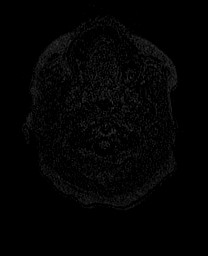
[im 21/144]
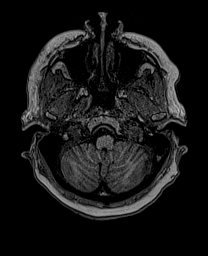
[im 41/144]
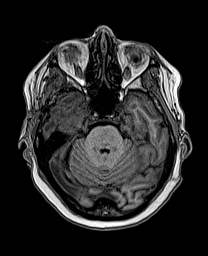
[im 62/144]
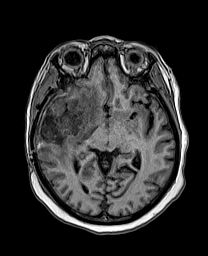
[im 82/144]
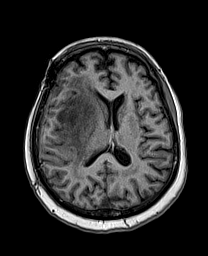
[im 103/144]
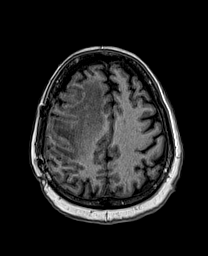
[im 123/144]
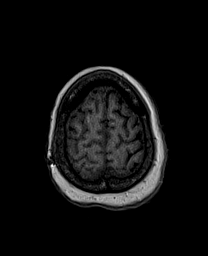
[im 144/144]
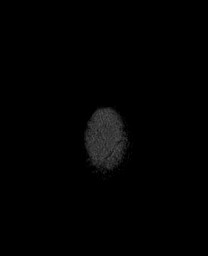

[Series 13: cor dwi_tracew · coronal · 5.0mm · 1.53mm/px · 3 of 50 slices shown]
[im 1/50]
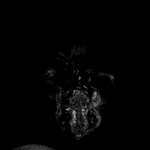
[im 25/50]
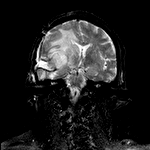
[im 50/50]
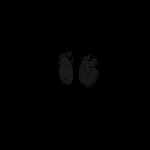

[Series 14: cor dwi_adc · coronal · 5.0mm · 1.53mm/px · 1 of 25 slices shown]
[im 1/25]
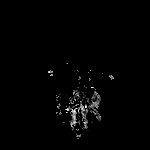

[Series 15: T2 post-contrast · coronal · 5.0mm · 0.57mm/px · 1 of 25 slices shown]
[im 1/25]
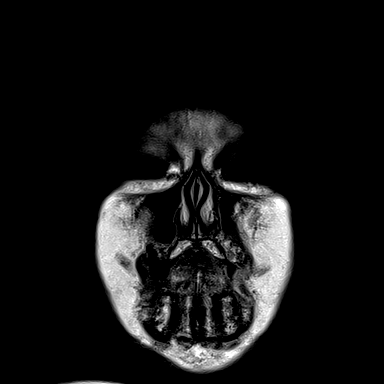

[Series 16: T1 post-contrast · axial · 1.0mm · 0.94mm/px · z∈[-68,+72]mm · 8 of 144 slices shown (1 of 3)]
[im 1/144]
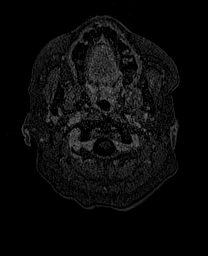
[im 21/144]
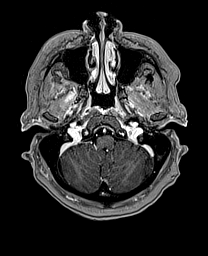
[im 41/144]
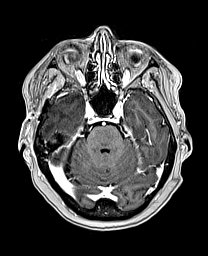
[im 62/144]
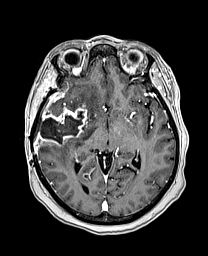
[im 82/144]
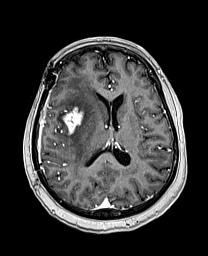
[im 103/144]
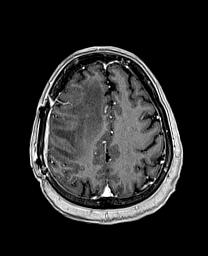
[im 123/144]
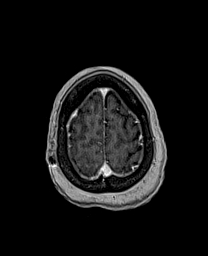
[im 144/144]
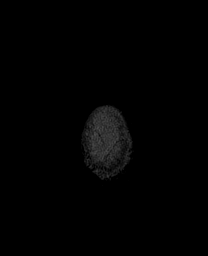

[Series 17: T1 · sagittal · 4.0mm · 0.94mm/px · 2 of 36 slices shown (3 of 4)]
[im 1/36]
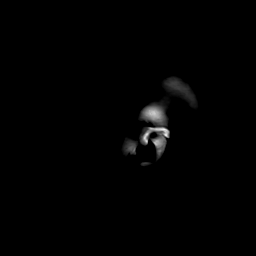
[im 36/36]
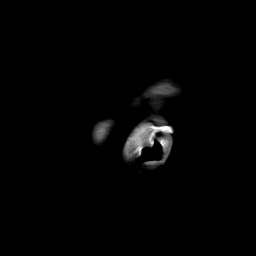

[Series 18: T1 · coronal · 4.0mm · 0.94mm/px · 2 of 30 slices shown (4 of 4)]
[im 1/30]
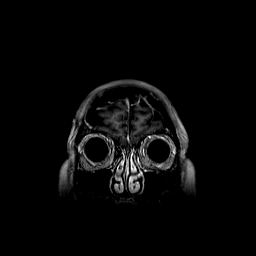
[im 30/30]
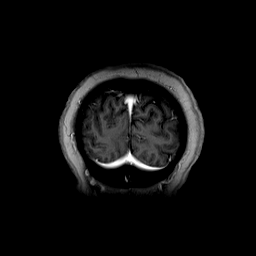

[Series 19: T1 post-contrast · coronal · 5.0mm · 0.43mm/px · 1 of 25 slices shown (2 of 3)]
[im 1/25]
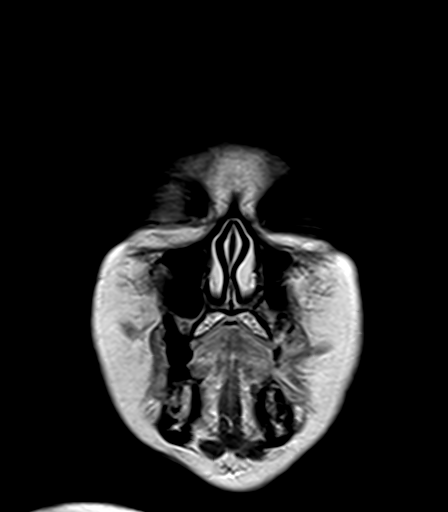

[Series 20: T1 post-contrast · sagittal · 5.0mm · 0.75mm/px · 1 of 24 slices shown (3 of 3)]
[im 1/24]
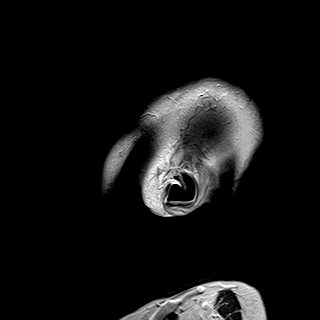

[Series 100: cor mpr post · sagittal · 1.0mm · 0.50mm/px · 3 of 57 slices shown]
[im 1/57]
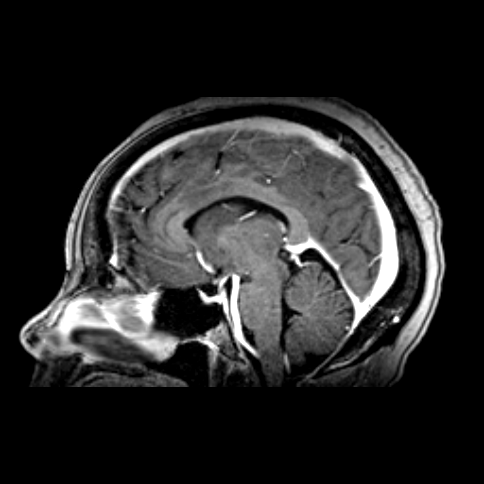
[im 29/57]
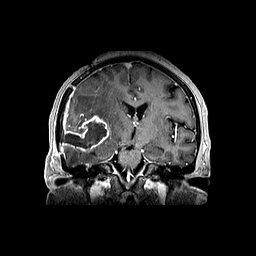
[im 57/57]
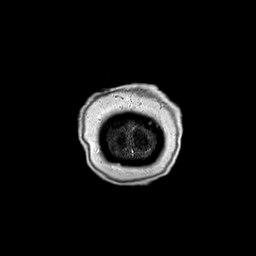

[48 of 48 positions shown; findings below may reference images not displayed]

FINDINGS: Brain: There is peripheral contrast enhancement surrounding the
right frontotemporal resection site. There is a 4 mm enhancing
nodule at the anterior inferior margin ([DATE]). Mass effect on the
right lateral ventricle is slightly improved and there is 3 mm of
leftward midline shift. Small amount of blood products at the
resection site. Hyperintense T2-weighted signal surrounding the
resection site has greatly worsened. Right convexity dural
thickening/enhancement is more evident.

Vascular: Major flow voids are preserved.

Skull and upper cervical spine: Normal calvarium and skull base.
Visualized upper cervical spine and soft tissues are normal.

Sinuses/Orbits:No paranasal sinus fluid levels or advanced mucosal
thickening. No mastoid or middle ear effusion. Normal orbits.
IMPRESSION: 1. Peripheral enhancement of the resection site, enhancing nodule at
the anterior inferior resection site margin and markedly increased
right hemispheric hyperintense T2-weighted signal, suggesting
recurrence/progression of tumor.
2. 3 mm of leftward midline shift.

## 2021-10-06 MED ORDER — GADOBUTROL 1 MMOL/ML IV SOLN
10.0000 mL | Freq: Once | INTRAVENOUS | Status: AC | PRN
  Administered 2021-10-06: 10 mL via INTRAVENOUS

## 2021-10-06 NOTE — Therapy (Signed)
?OUTPATIENT PHYSICAL THERAPY TREATMENT NOTE ? ? ?Patient Name: Denise Wright ?MRN: [id number] ?DOB:1950-09-26, 71 y.o., female ?Today's Date: 10/06/2021 ? ?PCP: Willey Blade, MD ?REFERRING PROVIDER: Cathlyn Parsons, PA-C ? ? PT End of Session - 10/06/21 1323   ? ? Visit Number 9   ? Number of Visits 15   ? Date for PT Re-Evaluation 10/20/21   ? Progress Note Due on Visit 10   ? PT Start Time 1320   Received from [facility]  ? PT Stop Time 0938   ? PT Time Calculation (min) 39 min   ? Equipment Utilized During Treatment Gait belt   ? Activity Tolerance Patient tolerated treatment well   ? Behavior During Therapy [facility] for tasks assessed/performed   Pt is lethargic today  ? ?  ?  ? ?  ? ? ? ?Past Medical History:  ?Diagnosis Date  ? Hyperlipidemia   ? Hypertension   ? Pneumonia   ? Thyroid disease   ? ?Past Surgical History:  ?Procedure Laterality Date  ? ABDOMINAL HYSTERECTOMY  1983  ? APPLICATION OF CRANIAL NAVIGATION Right 07/06/2021  ? Procedure: APPLICATION OF CRANIAL NAVIGATION;  Surgeon: Dawley, Theodoro Doing, DO;  Location: [facility];  Service: Neurosurgery;  Laterality: Right;  ? CRANIOTOMY Right 07/06/2021  ? Procedure: CRANIOTOMY TUMOR EXCISION;  Surgeon: Karsten Ro, DO;  Location: [facility];  Service: Neurosurgery;  Laterality: Right;  ? TONSILLECTOMY  1965  ? TOOTH EXTRACTION    ? ?Patient Active Problem List  ? Diagnosis Date Noted  ? Goals of care, counseling/discussion 07/20/2021  ? 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 Brain mass 06/30/2021  ? Hypothyroidism 06/30/2021  ? Hypokalemia 06/30/2021  ? Essential hypertension 05/25/2021  ? Hyperglycemia 07/14/2011  ? Normocytic anemia 07/13/2011  ? ? ?REFERRING DIAG: D49.6 (ICD-10-CM) - Brain tumor ([place])  ? ?THERAPY DIAG:  ?Other lack of coordination ? ?Unsteadiness on feet ? ?Other abnormalities of gait and mobility ? ?Muscle weakness (generalized) ? ?PERTINENT HISTORY: HTN, hypothyroidism, hyperglycemia, normocytic anemia,  glioblastoma with isocitrate dehydrogenase gene wildtype ? ?PRECAUTIONS: Fall ? ?SUBJECTIVE: Last MD visit, pt was placed on Dexamethasone.  No issues following the slide off the bed from before prior visit, no further falls.  Continues to require increased intermittent +2 assistance for transfers and bed mobility.  Requires the RW depending on fatigue level.  Follow-up MRI scheduled after therapy today. ? ?PAIN:  ?Are you having pain? Yes: NPRS scale: 6/10 ?Pain location: R side of head around incision ?Pain description: intermittent, pinpricks like "staples" ?Aggravating factors: N/A ?Relieving factors: motrin ?VITALS:  R BP assessed in sitting prior to activity 114/70 ?  Reassessed end of session 118/76 ?TODAY'S TREATMENT: ?Pt ambulates into gym 100' from speech therapy to treatment room 3.  She requires minA for balance with cuing for bilateral foot clearance and left attention to obstacles.  Cued to scan for treatment room. ? ?Once in room PT initiates discussion of available adaptive equipment pt and daughter have and use at home.  Discussed use of shower chair for additional ADLs like grooming and brushing teeth.  Discussed alternatives for current bedside commode as pt can rise from standard toilet seat with UE support.  Provided resources on toilet rails for safety and improved independence with toileting tasks.  Daughter states they are looking into purchasing bed rails.  PT discusses and provides resources on options for bed rails with and without legs with promotion of independence for dressing tasks and decreased  caregiver burden during bed mobility.  Further discussed shower and toilet setups in home with potential layout for grab bars.  Provided alternative grab bars resources.  Daughter to bring photos to next session of home setups that are most difficult currently (bed, toilet, step-in shower tub, etc).  All printed resources provided with understanding of out of pocket expense.   ? ?Discussed and  has daughter demonstrate current body mechanics when assisting pt in and out of bed/chair.  Provided extensive edu and demonstration of body mechanics maintaining proper back alignment, never reaching across high bed and pulling on pt, and using cues to promote pt engagement to task w/ minimal setup using LE positioning.  ? ?Had pt perform alternating elbow taps to each side preventing posterior lean.  Progressed to sustained positioning on R elbow w/ attempt to raise LLE onto bed surface, PT instructing daughter to only provide assistance when pt effort ceases.  Demos modA to manage BLE onto bed surface and maintain pt in side-lying.  Pt able to initiate roll to supine and is independent with roll to left.  She requires cuing to engage LLE, minA to position L knee, and cues to reach LUE when returning to roll right to return to EOM.  Demonstrated having pt hook legs on EOM and periscapular support while cuing pt to push from R elbow.  Edu on performing w/ caregiver sitting to block legs at EOM vs standing w staggered positioning next to or on bed for best body mechanics w/ height of pt bed. ?   ?Demonstrated STS transfers using caregiver sitting vs standing options w/ proper body mechanics. ? ? ?PATIENT EDUCATION: ?Education details: See treatment. ?Person educated: Patient, daughter ?Education method: Explanation ?Education comprehension: verbalized understanding ? ?HEP: ?Access Code: [email or web address] ?Initiated walking program 09/01/2021 with supervision in home environment/neighborhood on level paved surface w/ or w/o AD for endurance. ? ?OBJECTIVE:  ?  ?DIAGNOSTIC FINDINGS: Pt had MRI prior to surgical resection of tumor ?  ?COGNITION: ?Overall cognitive status: Within functional limits for tasks assessed ?  ?            ?SENSATION: ?Light touch: Appears intact ?Hot/Cold: Appears intact-pt reports no difficulty noting difference in shower and when washing hands ?  ?COORDINATION: ?Fingertip opposition: normal  bilaterally ?Heel-to-shin:  normal bilaterally; inc time ?  ?EDEMA:  ?Mild bilateral LEs-pt states she has had this for a while now. ?  ?MUSCLE TONE: None noted in LE ?  ?POSTURE: rounded shoulders, forward head, and increased thoracic kyphosis ?  ?AROM/PROM:   ?  ?AROM Right ?08/11/2021 Left ?08/11/2021  ?Hip flexion [facility] WFL  ?Hip extension " "  ?Hip abduction " "  ?Hip adduction " "  ?Hip internal rotation " "  ?Hip external rotation " "  ?Knee flexion " "  ?Knee extension " "  ?Ankle dorsiflexion " "  ?Ankle plantarflexion " "  ? (Blank rows = not tested) ?MMT: ?Left shoulder 4-/5 ?MMT Right ?08/11/2021 Left ?08/11/2021  ?Hip flexion 4/5 3/5  ?Hip extension      ?Hip abduction 4/5 4/5  ?Hip adduction 5/5 5/5  ?Hip internal rotation      ?Hip external rotation      ?Knee flexion 4-/5 3+/5  ?Knee extension 4+/5 4+/5  ?Ankle dorsiflexion 5/5 4+/5; no pain  ?Ankle plantarflexion      ?Ankle inversion      ?Ankle eversion      ?(Blank rows = not tested) ?  ?TRANSFERS: ?Assistive device utilized:  Walker - 2 wheeled  ?Sit to stand: Modified independence ?Stand to sit: Modified independence ?  ?STAIRS: 6", 4 step up using bilateral rails, step-to pattern, up with LLE, down with RLE.  Edu on safe stair negotiation, "up with the good, down with the bad" to prevent falls on steps into home.   ?  ?GAIT: ?Gait pattern: step to pattern, decreased stride length, and trunk flexed ?Distance walked: Several bouts in and around gym for balance and fall risk assessments. ?Assistive device utilized: Environmental consultant - 2 wheeled ?Level of assistance: SBA ?  ?FUNCTIONAL TESTs:  ?5 times sit to stand: 15.91 sec ?Timed up and go (TUG): 19.53 sec w/ RW ?10 meter walk test: (normal)=0.56 m/sec or 1.58f/sec; (Fast)=.77 m/sec or 2.56 ft/sec  w/ RW  ?  ?ASSESSMENT: ?  ?CLINICAL IMPRESSION: ?Session focused on functional mobility with caregiver training provided to daughter to promote safety and functional independence.  Resources provided on bed rails, grab  bars, and toileting equipment.  Further practice w/ bed mobility and preventing posterior lean when transferring.  Continue per POC as medically able. ?  ?  ?  ?OBJECTIVE IMPAIRMENTS Abnormal gait, decreased activity to

## 2021-10-06 NOTE — Therapy (Signed)
[facility] ?[place], [place], [address] ?Phone: [phone]   Fax:  [phone] ? ?Speech Language Pathology Treatment ? ?Patient Details  ?Name: Denise Wright ?MRN: [id number] ?Date of Birth: 05/24/1951 ?Referring Provider (SLP): Lauraine Rinne, PA-C ? ? ?Encounter Date: 10/06/2021 ? ? End of Session - 10/06/21 1232   ? ? Visit Number 10   ? Number of Visits 25   ? Date for SLP Re-Evaluation 11/09/21   ? Authorization Type BCBS   ? SLP Start Time 1233   ? SLP Stop Time  1315   ? SLP Time Calculation (min) 42 min   ? Activity Tolerance Patient tolerated treatment well   ? ?  ?  ? ?  ? ? ?Past Medical History:  ?Diagnosis Date  ? Hyperlipidemia   ? Hypertension   ? Pneumonia   ? Thyroid disease   ? ? ?Past Surgical History:  ?Procedure Laterality Date  ? ABDOMINAL HYSTERECTOMY  1983  ? APPLICATION OF CRANIAL NAVIGATION Right 07/06/2021  ? Procedure: APPLICATION OF CRANIAL NAVIGATION;  Surgeon: Dawley, Theodoro Doing, DO;  Location: [facility];  Service: Neurosurgery;  Laterality: Right;  ? CRANIOTOMY Right 07/06/2021  ? Procedure: CRANIOTOMY TUMOR EXCISION;  Surgeon: Karsten Ro, DO;  Location: [facility];  Service: Neurosurgery;  Laterality: Right;  ? TONSILLECTOMY  1965  ? TOOTH EXTRACTION    ? ? ?There were no vitals filed for this visit. ? ? Subjective Assessment - 10/06/21 1233   ? ? Subjective "Just tired - sleepy"   ? Patient is accompained by: Family member   ? Currently in Pain? No/denies   ? ?  ?  ? ?  ? ? ? ? ? ? ? ? ADULT SLP TREATMENT - 10/06/21 1232   ? ?  ? General Information  ? Behavior/Cognition Alert;Cooperative;Pleasant mood;Lethargic;Requires cueing;Confused   ?  ? Treatment Provided  ? Treatment provided Cognitive-Linquistic   ?  ? Cognitive-Linquistic Treatment  ? Treatment focused on Cognition;Patient/family/caregiver education   ? Skilled Treatment Overt confusion exhibited today with pt often attempting to rationalize confusion.  Daughter often provided correct information. Pt exhibited good recall of recent targeted compensations to aid attention, recall, and communication but exhibited difficulty with implementation. In review of HEP, mod to max A required to ID and correct patient's correction. Visual cues were helpful to aid attention and accuracy.   ?  ? Assessment / Recommendations / Plan  ? Plan Continue with current plan of care   ?  ? Progression Toward Goals  ? Progression toward goals Progressing toward goals   slow progress  ? ?  ?  ? ?  ? ? ? ? ? SLP Short Term Goals - 09/29/21 1234   ? ?  ? SLP SHORT TERM GOAL #1  ? Title pt will complete standardized testing   ? Status Achieved   ? Target Date 08/25/21   ?  ? SLP SHORT TERM GOAL #2  ? Title Pt will verbalize 3 attention/memory compensations for making/tracking appointments, managing meds, and other daily tasks in 2 sessions   ? Baseline 08-23-21   ? Status Partially Met   ? Target Date 09/08/21   ?  ? SLP SHORT TERM GOAL #3  ? Title pt will verbally generate and carryover (with min A) solutions to problems encountered in everyday living in 2 sessions   ? Baseline 08-23-21   ? Status Partially Met   ? Target Date 09/22/21   ?  ?  SLP SHORT TERM GOAL #4  ? Title pt will verbally state errors on mod complex written or verbal tasks with nonverbal cues   ? Baseline 08-23-21   ? Status Partially Met   ? Target Date 09/22/21   ? ?  ?  ? ?  ? ? ? SLP Long Term Goals - 10/06/21 1410   ? ?  ? SLP LONG TERM GOAL #1  ? Title Pt will verbalize AND IMPLEMEMENT 3 attention/memory compensations for making/tracking appointments, managing meds, and other daily tasks in 4 sessions   ? Baseline 10-06-21   ? Status Partially Met   ?  ? SLP LONG TERM GOAL #2  ? Title Pt will score higher on a patient-reported outcome measure in the last 1-2 visits than when taken in the first 2 sessions   ? Time 5   ? Period Weeks   ? Status On-going   ? Target Date 11/09/21   ?  ? SLP LONG TERM GOAL #3  ? Title pt will  verbally generate and independently carryover solutions to problems encountered in everyday living with min A in 4 sessions   ? Time 5   ? Period Weeks   ? Status On-going   ? Target Date 11/09/21   ?  ? SLP LONG TERM GOAL #4  ? Title pt will note verbal or written errors in work-like tasks independently for improving verbal and/or written accuracy   ? Time 5   ? Period Weeks   ? Status On-going   ? Target Date 11/09/21   ? ?  ?  ? ?  ? ? ? Plan - 10/06/21 1233   ? ? 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 Conducted education and instruction for implementation of energy conservation, memory/attention compensations, and functional cognitive linguistic tasks to target at home to maximize accuracy and performance of functional tasks at home. Her performance in some of these areas have flucutated as she progresses through chemo/rad tx due to temporary physiological changes to the brain. She will benefit from skilled ST targeting these deficits.   ? Speech Therapy Frequency 2x / week   ? Duration 12 weeks   pt may schedule x1/week for 6 weeks due to chemo/rad schedule  ? Treatment/Interventions Cognitive reorganization;Environmental controls;Internal/external aids;Compensatory strategies;Patient/family education;Functional tasks;Cueing hierarchy   ? Potential to Achieve Goals Good   ? Consulted and Agree with Plan of Care Patient   ? ?  ?  ? ?  ? ? ?Patient will benefit from skilled therapeutic intervention in order to improve the following deficits and impairments:   ?Cognitive communication deficit ? ? ? ?Problem List ?Patient Active Problem List  ? Diagnosis Date Noted  ? Goals of care, counseling/discussion 07/20/2021  ? 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[facility]) 07/12/2021  ? Brain mass 06/30/2021  ? Hypothyroidism 06/30/2021  ?  Hypokalemia 06/30/2021  ? Essential hypertension 05/25/2021  ? Hyperglycemia 07/14/2011  ? Normocytic anemia 07/13/2011  ? ? ?Marzetta Board, CCC-SLP ?10/06/2021, 2:11 PM ? ?[facility] ?[place] ?[place], [place], [address] ?Phone: [phone]   Fax:  [phone] ? ? ?Name: Amaiya Scruton Cerrone ?MRN: [id number] ?Date of Birth: 1951/06/07 ? ?

## 2021-10-09 ENCOUNTER — Inpatient Hospital Stay (HOSPITAL_BASED_OUTPATIENT_CLINIC_OR_DEPARTMENT_OTHER): Payer: BC Managed Care – PPO | Admitting: Internal Medicine

## 2021-10-09 ENCOUNTER — Inpatient Hospital Stay: Payer: BC Managed Care – PPO | Attending: Internal Medicine

## 2021-10-09 ENCOUNTER — Other Ambulatory Visit: Payer: Self-pay

## 2021-10-09 ENCOUNTER — Inpatient Hospital Stay: Payer: BC Managed Care – PPO

## 2021-10-09 ENCOUNTER — Encounter: Payer: Self-pay | Admitting: Emergency Medicine

## 2021-10-09 ENCOUNTER — Inpatient Hospital Stay: Payer: BC Managed Care – PPO | Admitting: Emergency Medicine

## 2021-10-09 VITALS — BP 132/70 | HR 73 | Temp 97.7°F | Resp 18 | Wt 215.0 lb

## 2021-10-09 DIAGNOSIS — C712 Malignant neoplasm of temporal lobe: Secondary | ICD-10-CM | POA: Diagnosis not present

## 2021-10-09 DIAGNOSIS — Z90710 Acquired absence of both cervix and uterus: Secondary | ICD-10-CM | POA: Insufficient documentation

## 2021-10-09 DIAGNOSIS — R5383 Other fatigue: Secondary | ICD-10-CM | POA: Diagnosis not present

## 2021-10-09 DIAGNOSIS — R112 Nausea with vomiting, unspecified: Secondary | ICD-10-CM | POA: Insufficient documentation

## 2021-10-09 DIAGNOSIS — C719 Malignant neoplasm of brain, unspecified: Secondary | ICD-10-CM

## 2021-10-09 DIAGNOSIS — I10 Essential (primary) hypertension: Secondary | ICD-10-CM | POA: Insufficient documentation

## 2021-10-09 DIAGNOSIS — Z806 Family history of leukemia: Secondary | ICD-10-CM | POA: Insufficient documentation

## 2021-10-09 DIAGNOSIS — R3915 Urgency of urination: Secondary | ICD-10-CM | POA: Insufficient documentation

## 2021-10-09 DIAGNOSIS — R600 Localized edema: Secondary | ICD-10-CM | POA: Diagnosis not present

## 2021-10-09 LAB — CBC WITH DIFFERENTIAL (CANCER CENTER ONLY)
Abs Immature Granulocytes: 0.04 10*3/uL (ref 0.00–0.07)
Basophils Absolute: 0 10*3/uL (ref 0.0–0.1)
Basophils Relative: 1 %
Eosinophils Absolute: 0.3 10*3/uL (ref 0.0–0.5)
Eosinophils Relative: 4 %
HCT: 34.7 % — ABNORMAL LOW (ref 36.0–46.0)
Hemoglobin: 11.2 g/dL — ABNORMAL LOW (ref 12.0–15.0)
Immature Granulocytes: 1 %
Lymphocytes Relative: 9 %
Lymphs Abs: 0.6 10*3/uL — ABNORMAL LOW (ref 0.7–4.0)
MCH: 30.4 pg (ref 26.0–34.0)
MCHC: 32.3 g/dL (ref 30.0–36.0)
MCV: 94.3 fL (ref 80.0–100.0)
Monocytes Absolute: 0.7 10*3/uL (ref 0.1–1.0)
Monocytes Relative: 10 %
Neutro Abs: 5.2 10*3/uL (ref 1.7–7.7)
Neutrophils Relative %: 75 %
Platelet Count: 163 10*3/uL (ref 150–400)
RBC: 3.68 MIL/uL — ABNORMAL LOW (ref 3.87–5.11)
RDW: 13.6 % (ref 11.5–15.5)
WBC Count: 6.8 10*3/uL (ref 4.0–10.5)
nRBC: 0 % (ref 0.0–0.2)

## 2021-10-09 LAB — CMP (CANCER CENTER ONLY)
ALT: 18 U/L (ref 0–44)
AST: 13 U/L — ABNORMAL LOW (ref 15–41)
Albumin: 3.6 g/dL (ref 3.5–5.0)
Alkaline Phosphatase: 65 U/L (ref 38–126)
Anion gap: 6 (ref 5–15)
BUN: 27 mg/dL — ABNORMAL HIGH (ref 8–23)
CO2: 30 mmol/L (ref 22–32)
Calcium: 8.8 mg/dL — ABNORMAL LOW (ref 8.9–10.3)
Chloride: 106 mmol/L (ref 98–111)
Creatinine: 1.21 mg/dL — ABNORMAL HIGH (ref 0.44–1.00)
GFR, Estimated: 49 mL/min — ABNORMAL LOW (ref >60)
Glucose, Bld: 140 mg/dL — ABNORMAL HIGH (ref 70–99)
Potassium: 3.5 mmol/L (ref 3.5–5.1)
Sodium: 142 mmol/L (ref 135–145)
Total Bilirubin: 0.4 mg/dL (ref 0.3–1.2)
Total Protein: 6.2 g/dL — ABNORMAL LOW (ref 6.5–8.1)

## 2021-10-09 MED ORDER — DEXAMETHASONE 1 MG PO TABS: ORAL_TABLET | ORAL | 0 refills | Status: DC

## 2021-10-09 NOTE — Progress Notes (Signed)
? ?[facility] [address]  ?[place], [place] [address] ?[phone] ? ? ?Interval Evaluation ? ?Date of Service: 10/09/21 ?Patient Name: Denise Wright ?Patient MRN: [id number] ?Patient DOB: 11-20-1950 ?Provider: Ventura Sellers, MD ? ?Identifying Statement:  ?Denise Wright is a 71 y.o. female with right temporal glioblastoma  ? ?Oncologic History: ?Oncology History  ?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[facility])  ?07/06/2021 Surgery  ? Right temporal craniotomy, resection with Dr. Reatha Armour; path is glioblastoma IDH-1wt ?  ?08/07/2021 - 09/15/2021 Radiation Therapy  ? IMRT and concurrent Temodar 65m/m2 ?  ? ? ?Biomarkers: ? ?MGMT Unknown.  ?IDH 1/2 Wild type.  ?EGFR Unknown  ?TERT Unknown  ? ?Interval History: ?Denise Wright presents today for follow up after recent MRI brain.  Left arm and leg weakness are improved after starting the decadron 455mdaily.  Leg edema persists, not significantly changed from last week.  PO intake and hydration have improved as discussed.  Fatigue continues to be very persistent and problematic.  Denies seizures or headaches today. ? ?H+P (07/20/21) Patient presented to medical attention last month with several days, weeks of progressive mental status changes.  Family noticed she was confused and disoriented over normal conversation and activities of daily living.  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  CNS imaging demonstrated an enhancing mass in the right temporal lobe; she underwent craniotomy, resection with Dr. DaReatha Armourn 07/06/21, which demonstrated glioblastoma.  Following surgery she experienced worsening left sided weakness, which prompted rehabilitation admission.  At present, she is doing some walking on her own, albeit slowly.  Left arm and hand have improved, she is starting occupational therapy this week. ? ?Medications: ?Current Outpatient Medications  on File Prior to Visit  ?Medication Sig Dispense Refill  ? acetaminophen (TYLENOL) 325 MG tablet Take 2 tablets (650 mg total) by mouth every 6 (six) hours as needed for mild pain (or Fever >/= 101).    ? ALPRAZolam (XANAX) 0.25 MG tablet Take 1 tablet (0.25 mg total) by mouth 2 (two) times daily as needed for anxiety. 10 tablet 0  ? b complex vitamins capsule Take 1 capsule by mouth daily.    ? cholecalciferol (VITAMIN D) 1000 UNITS tablet Take 2,000 Units by mouth daily.    ? dexamethasone (DECADRON) 4 MG tablet Take 1 tablet (4 mg total) by mouth daily. 30 tablet 0  ? docusate sodium (COLACE) 100 MG capsule Take 1 capsule (100 mg total) by mouth 2 (two) times daily. 10 capsule 0  ? famotidine (PEPCID) 20 MG tablet Take 1 tablet (20 mg total) by mouth daily. 30 tablet 2  ? fluticasone (FLONASE) 50 MCG/ACT nasal spray Place 1 spray into both nostrils daily as needed for allergies. OTC    ? furosemide (LASIX) 40 MG tablet Take 1 tablet (40 mg total) by mouth daily. 30 tablet 1  ? hydrochlorothiazide (HYDRODIURIL) 12.5 MG tablet Take 0.5 tablets (6.25 mg total) by mouth daily. 30 tablet 0  ? levothyroxine (SYNTHROID) 50 MCG tablet Take 1 tablet (50 mcg total) by mouth daily. 30 tablet 0  ? Multiple Vitamins-Minerals (MULTIVITAMIN WITH MINERALS) tablet Take 1 tablet by mouth daily.    ? NON FORMULARY Apply topically as needed. Magne Sport Balm ?Apply to skin as needed for joint/muscle pain    ? ondansetron (ZOFRAN) 8 MG tablet Take 1 tablet (8 mg total) by mouth 2 (two) times daily as needed (  nausea and vomiting). May take 30-60 minutes prior to Temodar administration if nausea/vomiting occurs. 30 tablet 1  ? ?No current facility-administered medications on file prior to visit.  ? ? ?Allergies:  ?Allergies  ?Allergen Reactions  ? Fexofenadine Hives  ? Penicillins Itching and Other (See Comments)  ?  Pulling sensation in body  ? ?Past Medical History:  ?Past Medical History:  ?Diagnosis Date  ? Hyperlipidemia   ?  Hypertension   ? Pneumonia   ? Thyroid disease   ? ?Past Surgical History:  ?Past Surgical History:  ?Procedure Laterality Date  ? ABDOMINAL HYSTERECTOMY  1983  ? APPLICATION OF CRANIAL NAVIGATION Right 07/06/2021  ? Procedure: APPLICATION OF CRANIAL NAVIGATION;  Surgeon: Dawley, Theodoro Doing, DO;  Location: [facility];  Service: Neurosurgery;  Laterality: Right;  ? CRANIOTOMY Right 07/06/2021  ? Procedure: CRANIOTOMY TUMOR EXCISION;  Surgeon: Karsten Ro, DO;  Location: [facility];  Service: Neurosurgery;  Laterality: Right;  ? TONSILLECTOMY  1965  ? TOOTH EXTRACTION    ? ?Social History:  ?Social History  ? ?Socioeconomic History  ? Marital status: Divorced  ?  Spouse name: Not on file  ? Number of children: 3  ? Years of education: 39  ? Highest education level: Not on file  ?Occupational History  ? Occupation: Masters degree in public service  ?  Employer: [employer]  ? Occupation: Working on Engineer, maintenance  ? Occupation: Training and development officer of [employer]  ?Tobacco Use  ? Smoking status: Never  ? Smokeless tobacco: Never  ?Vaping Use  ? Vaping Use: Never used  ?Substance and Sexual Activity  ? Alcohol use: No  ? Drug use: No  ? Sexual activity: Yes  ?  Birth control/protection: Surgical  ?Other Topics Concern  ? Not on file  ?Social History Narrative  ? Divorced, lives in [place] alone.  Independent of ADLs.  ? ?Social Determinants of Health  ? ?Financial Resource Strain: Not on file  ?Food Insecurity: Not on file  ?Transportation Needs: Not on file  ?Physical Activity: Not on file  ?Stress: Not on file  ?Social Connections: Not on file  ?Intimate Partner Violence: Not on file  ? ?Family History:  ?Family History  ?Problem Relation Age of Onset  ? Diabetes Mother   ? Hypertension Mother   ? Hypertension Father   ? Leukemia Father   ? Stroke Father   ? Cancer Brother   ? Pneumonia Brother   ? ? ?Review of Systems: ?Constitutional: Doesn't report fevers, chills or abnormal weight  loss ?Eyes: Doesn't report blurriness of vision ?Ears, nose, mouth, throat, and face: Doesn't report sore throat ?Respiratory: Doesn't report cough, dyspnea or wheezes ?Cardiovascular: Doesn't report palpitation, chest discomfort  ?Gastrointestinal:  Doesn't report nausea, constipation, diarrhea ?GU: Doesn't report incontinence ?Skin: Doesn't report skin rashes ?Neurological: Per HPI ?Musculoskeletal: Doesn't report joint pain ?Behavioral/Psych: Doesn't report anxiety ? ?Physical Exam: ?Vitals:  ? 10/09/21 1220  ?BP: 132/70  ?Pulse: 73  ?Resp: 18  ?Temp: 97.7 ?F (36.5 ?C)  ?SpO2: 100%  ? ? ?KPS: 60 ?ECOG 1 ?General: Alert, cooperative, pleasant, in no acute distress ?Head: Normal ?EENT: No conjunctival injection or scleral icterus.  ?Lungs: Resp effort normal ?Cardiac: Regular rate ?Abdomen: Non-distended abdomen ?Skin: Rash on truck, arms ?Extremities: 2+ dependent edema R>L ? ?Neurologic Exam: ?Mental Status: Awake, alert, attentive to examiner. Oriented to self and environment. Language is fluent with intact comprehension.  ?Cranial Nerves: Visual acuity is grossly normal. Visual fields  are full. Extra-ocular movements intact. No ptosis. Face is symmetric ?Motor: Tone and bulk are normal. Power is 4/5 in left arm and leg, asterixis noted with posturing. Reflexes are symmetric, no pathologic reflexes present.  ?Sensory: Intact to light touch ?Gait: Non ambulatory ? ?Labs: ?I have reviewed the data as listed ?   ?Component Value Date/Time  ? NA 145 10/02/2021 1141  ? K 3.3 (L) 10/02/2021 1141  ? CL 104 10/02/2021 1141  ? CO2 31 10/02/2021 1141  ? GLUCOSE 119 (H) 10/02/2021 1141  ? BUN 34 (H) 10/02/2021 1141  ? CREATININE 2.27 (H) 10/02/2021 1141  ? CALCIUM 9.2 10/02/2021 1141  ? PROT 6.5 10/02/2021 1141  ? ALBUMIN 3.7 10/02/2021 1141  ? AST 17 10/02/2021 1141  ? ALT 10 10/02/2021 1141  ? ALKPHOS 76 10/02/2021 1141  ? BILITOT 0.7 10/02/2021 1141  ? GFRNONAA 23 (L) 10/02/2021 1141  ? GFRAA 70 (L) 07/17/2011 0311   ? ?Lab Results  ?Component Value Date  ? WBC 6.8 10/09/2021  ? NEUTROABS 5.2 10/09/2021  ? HGB 11.2 (L) 10/09/2021  ? HCT 34.7 (L) 10/09/2021  ? MCV 94.3 10/09/2021  ? PLT 163 10/09/2021  ? ? ?Imaging: ? ?[facility] [facility]

## 2021-10-09 NOTE — Research (Signed)
TRIAL: WF-1801 A Single Arm, Pilot Study of Ramipril for Preventing Radiation-Induced Cognitive Decline in Glioblastoma (GBM) Patients Receiving Brain Radiotherapy ? ?1 MONTH POST RT VISIT ?  ?Patient arrives today Accompanied by [name]  for the 1 month post RT visit.  ?  ?PROs: ?Per study protocol, all PROs required for this visit were completed prior to other study activities and completeness has been verified.   ?  ?LABS: ?Local labs were collected per consent and study protocol: Patient Denise Wright tolerated well without complaint (CMP and CBC). ?  ?MEDICATION REVIEW: ?Patient reviews and verifies the current medication list is correct.  Reported changes were recorded on the medication list and marked as reviewed. All other listed medications remain the same. ?Lasix and HCTZ were discontinued during MD Vaslow visit on 10/02/21 by provider d/t lab values. ?Decadron restarted by MD Vaslow on 10/02/21 at 4 mg/day.  Decadron was decreased today to 3 mg/day, pt to start tomorrow (10/10/21). ? ?VITAL SIGNS: ?Vital signs are collected per study protocol during today's visit with MD Vaslow.  BP today 132/70. ? ?MD/PROVIDER VISIT: ?Patient sees MD Vaslow for today's visit. ?  ?ADVERSE EVENTS:  All graded using CTCAE v. 5.0 ?Patient Denise Wright reports ongoing bilateral ankle pain/arthralgia (Grade 1 for both) with no change, remains unrelated to study drug, no action or reporting required at this time. ?The patient also reports ongoing bilateral leg swelling/edema limbs (Grade 2) with no change, remains unrelated to study drug, no action or reporting required at this time. ?Patient reports ongoing fatigue (Grade 2) which has slightly improved per patient but is still limiting ADLs, remains unrelated to study drug, no action or reporting required at this time. ? Rash (Grade 1) present on bilat arms and trunk since 10/02/21 per MD Vaslow, no intervention.  Per MD unrelated to study drug, no action or reporting  required at this time.  See AE chart for details on rash. ?Labs done on 10/02/21 show the following: ?Increased creatinine (Grade 2) of 2.27, pt remains off drug at this time.  Per MD Vaslow still unrelated to study drug, no reporting or action required at this time.   ?eGFR is decreased at 23 ml/min but per MD Vaslow does not meet requirements for Chronic kidney disease, per MD this value is clinically insignificant at this time and unrelated to study drug.  MD stopped Lasix and HCTZ. ?BUN elevated at 34 mg/dL, per MD this value is clinically insignificant at this time and unrelated to study drug.  No action or reporting required. ?White blood cell decreased resolved. ?Anemia (Grade 1) at 11.2 Hgb, no intervention.  Per MD Vaslow unrelated to study drug, no action or reporting required this time. ?Hypokalemia (Grade 1) at 3.3, no intervention.  Per MD Vaslow unrelated to study drug, no action or reporting required this time. ?Hyperglycemia (Grade 1) at 119, no intervention.  Per MD Vaslow unrelated to study drug, no action or reporting required this time. ?Labs done on 10/09/21 show the following: ? Increased creatinine of 1.21, improved to Grade 1 from 4/24.  Per MD Vaslow still unrelated to study drug, no reporting or action required at this time. ? eGFR is decreased at 49, improved from 4/24.  Per MD Vaslow does not meet requirements for Chronic kidney disease, per MD this value is clinically insignificant at this time and unrelated to study drug. ? Anemia (Grade 1) remains at 11.2 Hgb, no intervention.  Per MD Vaslow unrelated to study drug,  no action or reporting required this time. ? Hypokalemia resolved. ? BUN elevated at 27 mg/dL, per MD this value is clinically insignificant at this time and unrelated to study drug.  No action or reporting required. ? Hyperglycemia (Grade 1) at 140, no intervention.  Per MD Vaslow unrelated to study drug, no action or reporting required this time. ? Hypocalcemia (Grade 1) at  8.8, no intervention.  Per MD Vaslow unrelated to study drug, no action or reporting required at this time. ? All other abnormal lab values for both 10/02/21 and 10/09/21 ruled as clinically insignificant and unrelated to study drug by MD Vaslow and do not require reporting or action at this time. ? ? ?ADVERSE EVENT LOGJohniece Hornbaker Wright ?[id number] ?[id number] ? ?10/09/2021 ? ?Adverse Event Log ? ?Study/Protocol: WF 1801 ?Cycle: 1 month post RT ? ?Event Grade Onset Date Resolved Date Drug Name Attribution Treatment Comments  ?Arthralgia (L ankle pain) Grade 1 08/09/21 Ongoing Ramipril Unrelated Sports balm, elevation, epsom salts    ?Arthralgia (R ankle pain) Grade 1 08/13/21 Ongoing Ramipril Unrelated Sports balm, elevation, epsom salts    ?Increased creatinine Grade 1 08/10/21 10/02/2021 ?Increased to Grade 2 Ramipril Unrelated None    ?Edema limbs (bilateral leg swelling) Grade 2 08/15/21 Ongoing Ramipril Unrelated Lasix    ?Fatigue Grade 2 08/27/21 Ongoing Ramipril Unrelated None    ?White blood cell decreased Grade 1 09/14/21 10/02/21 ?Resolved Ramipril Unrelated None    ?Anemia Grade 1 09/14/21 Ongoing Ramipril Unrelated None   ?Increased creatinine Grade 2 10/02/21 10/09/21 ?Decreased to Grade 1 Ramipril Unrelated Stop HCTZ and Lasix   ?Increased creatinine Grade 1 10/09/21 Ongoing Ramipril Unrelated None   ?Hyperglycemia Grade 1 10/02/21 Ongoing Ramipril Unrelated None   ?Hypokalemia Grade 1 10/02/21 10/09/21 ?Resolved Ramipril Unrelated None   ?Hypocalcemia Grade 1 10/09/21 Ongoing Ramipril Unrelated None   ?Rash (skin and subcutaneous tissue disorders- other- red non-raised rash bilateral trunk/arms) Grade 1 10/02/21 Ongoing Ramipril Unrelated None   ? ? ?NEUROCOGNITIVE ASSESSMENT: ?Neurocognitive assessment is completed by Remer Macho, Clinical Research Assistant. ? ?MEDICATION DIARIES and STUDY DRUG: ?Patient remains off agent but on study.  No diaries or drug to collect. ? ?IMAGING AND DISEASE STATUS: ? Brain MRI performed on 10/06/2021.   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  Patient will continue off agent but on study. ? ?DISPOSITION: ?Upon completion off all study requirements, patient was escorted to exit with belongings.  2 month post RT phone call expected ~11/16/21, pt aware to expect call. ? ?The patient was thanked for their time and continued voluntary participation in this study. Patient Denise Wright has been provided direct contact information and is encouraged to contact this Nurse for any needs or questions. ? ?Denise Guiles 'Learta Codding' Curtis Uriarte, RN, BSN ?Clinical Research Nurse I ?10/10/21 ?1:10 PM ? ? ? ? ?

## 2021-10-10 ENCOUNTER — Encounter: Payer: Self-pay | Admitting: Internal Medicine

## 2021-10-10 ENCOUNTER — Telehealth: Payer: Self-pay | Admitting: Internal Medicine

## 2021-10-10 NOTE — Telephone Encounter (Signed)
Scheduled per 5/1 los, attempted to call, no voicemail available, will mail calender ?

## 2021-10-11 ENCOUNTER — Encounter: Payer: Self-pay | Admitting: Internal Medicine

## 2021-10-11 ENCOUNTER — Ambulatory Visit: Payer: BC Managed Care – PPO | Attending: Internal Medicine | Admitting: Physical Therapy

## 2021-10-11 ENCOUNTER — Encounter: Payer: Self-pay | Admitting: Physical Therapy

## 2021-10-11 ENCOUNTER — Ambulatory Visit: Payer: BC Managed Care – PPO | Admitting: Speech Pathology

## 2021-10-11 VITALS — BP 126/74

## 2021-10-11 DIAGNOSIS — R278 Other lack of coordination: Secondary | ICD-10-CM | POA: Diagnosis not present

## 2021-10-11 DIAGNOSIS — R471 Dysarthria and anarthria: Secondary | ICD-10-CM | POA: Diagnosis present

## 2021-10-11 DIAGNOSIS — R2689 Other abnormalities of gait and mobility: Secondary | ICD-10-CM | POA: Insufficient documentation

## 2021-10-11 DIAGNOSIS — M6281 Muscle weakness (generalized): Secondary | ICD-10-CM | POA: Diagnosis present

## 2021-10-11 DIAGNOSIS — R2681 Unsteadiness on feet: Secondary | ICD-10-CM | POA: Diagnosis present

## 2021-10-11 DIAGNOSIS — R41841 Cognitive communication deficit: Secondary | ICD-10-CM | POA: Insufficient documentation

## 2021-10-11 NOTE — Therapy (Signed)
?OUTPATIENT PHYSICAL THERAPY TREATMENT NOTE ? ? ?Patient Name: Denise Wright ?MRN: [id number] ?DOB:23-Jan-1951, 71 y.o., female ?Today's Date: 10/11/2021 ? ?PCP: Willey Blade, MD ?REFERRING PROVIDER: Cathlyn Parsons, PA-C ? ? PT End of Session - 10/11/21 1327   ? ? Visit Number 10   ? Number of Visits 15   ? Date for PT Re-Evaluation 10/20/21   ? Authorization Type BCBS COMM PPO   ? Progress Note Due on Visit 10   ? PT Start Time 1319   received from [place]  ? PT Stop Time 1358   ? PT Time Calculation (min) 39 min   ? Equipment Utilized During Treatment Gait belt   ? Activity Tolerance Patient tolerated treatment well   ? Behavior During Therapy [facility] for tasks assessed/performed   Pt is lethargic today  ? ?  ?  ? ?  ? ? ? ?Past Medical History:  ?Diagnosis Date  ? Hyperlipidemia   ? Hypertension   ? Pneumonia   ? Thyroid disease   ? ?Past Surgical History:  ?Procedure Laterality Date  ? ABDOMINAL HYSTERECTOMY  1983  ? APPLICATION OF CRANIAL NAVIGATION Right 07/06/2021  ? Procedure: APPLICATION OF CRANIAL NAVIGATION;  Surgeon: Dawley, Theodoro Doing, DO;  Location: [place];  Service: Neurosurgery;  Laterality: Right;  ? CRANIOTOMY Right 07/06/2021  ? Procedure: CRANIOTOMY TUMOR EXCISION;  Surgeon: Karsten Ro, DO;  Location: [place];  Service: Neurosurgery;  Laterality: Right;  ? TONSILLECTOMY  1965  ? TOOTH EXTRACTION    ? ?Patient Active Problem List  ? Diagnosis Date Noted  ? Goals of care, counseling/discussion 07/20/2021  ? 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[place]) 07/12/2021  ? Brain mass 06/30/2021  ? Hypothyroidism 06/30/2021  ? Hypokalemia 06/30/2021  ? Essential hypertension 05/25/2021  ? Hyperglycemia 07/14/2011  ? Normocytic anemia 07/13/2011  ? ? ?REFERRING DIAG: D49.6 (ICD-10-CM) - Brain tumor ([place])  ? ?THERAPY DIAG:  ?Other lack of coordination ? ?Unsteadiness on feet ? ?Other abnormalities of gait and mobility ? ?Muscle weakness (generalized) ? ?PERTINENT HISTORY: HTN, hypothyroidism,  hyperglycemia, normocytic anemia, glioblastoma with isocitrate dehydrogenase gene wildtype ? ?PRECAUTIONS: Fall ? ?SUBJECTIVE: Daughter reports that pt is getting into bed without +2 assistance and is intermittently getting into bed independently.  No falls.  Pt is elevating legs intermittently.  "Every day this week has been better." ? ?PAIN:  ?Are you having pain? No ?VITALS:  R BP assessed in sitting: ?Today's Vitals  ? 10/11/21 1324  ?BP: 126/74  ? ?There is no height or weight on file to calculate BMI. ? ?TODAY'S TREATMENT: ?Pt ambulates into gym 100' from speech therapy to gym SBA w/ min cuing for L attention, pt runs into doorframe with L shoulder gently w/o LOB x1, verbalizes awareness w/ self-correction of path using lateral step.  ? ?Pt performs alt elbow taps prior to side-lying to lay supine on mat table as pt daughter reports pt does this at home.  Pt demos transfer in<>out of bed independently w/ ability to lift BLE onto mat surface w/o use of UE.  Requires inc time to position using bridge technique to scoot down length of mat. Pt demo log roll to return to EOM. ? ?Supine SAQ over bolster 2x10, second set added 3 sec hold, min cues for slow lower ?STS w/o UE Support x10, mod cues for pace and controlled descent ? ?Gait training and obstacle approach on the left w/ mod cues for side stepping around obstacles that cannot be stepped  over, min cues for BLE clearance and maintaining center of the path w/ use of colored path on gym floor for visuospatial cuing.  Pt ambulates 230' + 115' Supervision on level surface in gym w/o AD before requesting rest.  She reports mild R knee achiness after prolonged ambulation.   ? ? ? ? ?PATIENT EDUCATION: ?Education details: Edu on elevation of LE w/ intermittent standing and dependent positioning to promote blood flow and contracture at hip and knees.  Answered daughter's questions about purchase of wedge vs half moon pillow to position LE when sitting in recliner.   Reinforced walking with family member at home as pt is functionally improved from prior 2 visits. ?Person educated: Patient, daughter ?Education method: Explanation ?Education comprehension: verbalized understanding ? ?HEP: ?Access Code: [email or web address] ?Initiated walking program 09/01/2021 with supervision in home environment/neighborhood on level paved surface w/ or w/o AD for endurance. ? ?OBJECTIVE:  ?  ?DIAGNOSTIC FINDINGS: Pt had MRI prior to surgical resection of tumor ?  ?COGNITION: ?Overall cognitive status: Within functional limits for tasks assessed ?  ?            ?SENSATION: ?Light touch: Appears intact ?Hot/Cold: Appears intact-pt reports no difficulty noting difference in shower and when washing hands ?  ?COORDINATION: ?Fingertip opposition: normal bilaterally ?Heel-to-shin:  normal bilaterally; inc time ?  ?EDEMA:  ?Mild bilateral LEs-pt states she has had this for a while now. ?  ?MUSCLE TONE: None noted in LE ?  ?POSTURE: rounded shoulders, forward head, and increased thoracic kyphosis ?  ?AROM/PROM:   ?  ?AROM Right ?08/11/2021 Left ?08/11/2021  ?Hip flexion [facility] WFL  ?Hip extension " "  ?Hip abduction " "  ?Hip adduction " "  ?Hip internal rotation " "  ?Hip external rotation " "  ?Knee flexion " "  ?Knee extension " "  ?Ankle dorsiflexion " "  ?Ankle plantarflexion " "  ? (Blank rows = not tested) ?MMT: ?Left shoulder 4-/5 ?MMT Right ?08/11/2021 Left ?08/11/2021  ?Hip flexion 4/5 3/5  ?Hip extension      ?Hip abduction 4/5 4/5  ?Hip adduction 5/5 5/5  ?Hip internal rotation      ?Hip external rotation      ?Knee flexion 4-/5 3+/5  ?Knee extension 4+/5 4+/5  ?Ankle dorsiflexion 5/5 4+/5; no pain  ?Ankle plantarflexion      ?Ankle inversion      ?Ankle eversion      ?(Blank rows = not tested) ?  ?TRANSFERS: ?Assistive device utilized: Environmental consultant - 2 wheeled  ?Sit to stand: Modified independence ?Stand to sit: Modified independence ?  ?STAIRS: 6", 4 step up using bilateral rails, step-to pattern, up with LLE, down with  RLE.  Edu on safe stair negotiation, "up with the good, down with the bad" to prevent falls on steps into home.   ?  ?GAIT: ?Gait pattern: step to pattern, decreased stride length, and trunk flexed ?Distance walked: Several bouts in and around gym for balance and fall risk assessments. ?Assistive device utilized: Environmental consultant - 2 wheeled ?Level of assistance: SBA ?  ?FUNCTIONAL TESTs:  ?5 times sit to stand: 15.91 sec ?Timed up and go (TUG): 19.53 sec w/ RW ?10 meter walk test: (normal)=0.56 m/sec or 1.26f/sec; (Fast)=.77 m/sec or 2.56 ft/sec  w/ RW  ?  ?ASSESSMENT: ?  ?CLINICAL IMPRESSION: ?Pt demonstrates significant improvement in functional mobility during session with improved independence w/ transfer to and from supine.  She ambulates w/ supervision today working on obstacle approach and  navigation using lateral stepping vs step-over for large obstacles on left side.  She continues to progress towards LTGs and would further benefit from skilled PT to continue addressing strength, activity tolerance, and left inattention.  ?  ?  ?  ?OBJECTIVE IMPAIRMENTS Abnormal gait, decreased activity tolerance, decreased balance, decreased endurance, decreased mobility, difficulty walking, decreased strength, and increased edema.  ?  ?ACTIVITY LIMITATIONS cleaning, community activity, driving, meal prep, occupation, laundry, yard work, and shopping.  ?  ?PERSONAL FACTORS HTN, hypothyroidism, hyperglycemia are also affecting patient's functional outcome.  ?  ?  ?REHAB POTENTIAL: Good ?  ?CLINICAL DECISION MAKING: Evolving/moderate complexity ?  ?EVALUATION COMPLEXITY: Moderate ?  ?  ?GOALS: ?Goals reviewed with patient? Yes ?  ?SHORT TERM GOALS: ?  ?Pt will be independent with strength and balance HEP with supervision from family. ?Baseline: to be initiated at second visit ?Target date: 09/15/2021 ?Goal status: MET ?  ?2.  Pt will decrease 5xSTS to <13 seconds in order to demonstrate decreased risk for falls and improved functional  bilateral LE strength and power. ?Baseline: 15.91 sec; 09/15/2021 12.10 seconds ?Target date: 09/15/2021 ?Goal status: MET ?  ?3.  Pt will demonstrate TUG of <16 seconds in order to decrease risk of falls and

## 2021-10-11 NOTE — Therapy (Signed)
[facility] ?[place], [place], [address] ?Phone: [phone]   Fax:  [phone] ? ?Speech Language Pathology Treatment ? ?Patient Details  ?Name: Denise Wright ?MRN: [id number] ?Date of Birth: 11-Sep-1950 ?Referring Provider (SLP): Lauraine Rinne, PA-C ? ? ?Encounter Date: 10/11/2021 ? ? End of Session - 10/11/21 1232   ? ? Visit Number 11   ? Number of Visits 25   ? Date for SLP Re-Evaluation 11/09/21   ? Authorization Type BCBS   ? SLP Start Time 1232   ? SLP Stop Time  1319   ? SLP Time Calculation (min) 47 min   ? Activity Tolerance Patient tolerated treatment well   ? ?  ?  ? ?  ? ? ?Past Medical History:  ?Diagnosis Date  ? Hyperlipidemia   ? Hypertension   ? Pneumonia   ? Thyroid disease   ? ? ?Past Surgical History:  ?Procedure Laterality Date  ? ABDOMINAL HYSTERECTOMY  1983  ? APPLICATION OF CRANIAL NAVIGATION Right 07/06/2021  ? Procedure: APPLICATION OF CRANIAL NAVIGATION;  Surgeon: Dawley, Theodoro Doing, DO;  Location: [place];  Service: Neurosurgery;  Laterality: Right;  ? CRANIOTOMY Right 07/06/2021  ? Procedure: CRANIOTOMY TUMOR EXCISION;  Surgeon: Karsten Ro, DO;  Location: [place];  Service: Neurosurgery;  Laterality: Right;  ? TONSILLECTOMY  1965  ? TOOTH EXTRACTION    ? ? ?There were no vitals filed for this visit. ? ? Subjective Assessment - 10/11/21 1235   ? ? Subjective "it gets frustrating" re: correcting homework   ? Patient is accompained by: Family member   ? Currently in Pain? No/denies   ? ?  ?  ? ?  ? ? ? ? ? ? ? ? ? ADULT SLP TREATMENT - 10/11/21 0001   ? ?  ? General Information  ? Behavior/Cognition Alert;Cooperative;Pleasant mood;Lethargic;Requires cueing;Confused   ?  ? Treatment Provided  ? Treatment provided Cognitive-Linquistic   ?  ? Cognitive-Linquistic Treatment  ? Treatment focused on Cognition;Patient/family/caregiver education   ? Skilled Treatment Provided pt with re-education on attention  compensations/strategies to aid in pt success in challenging tasks. Using clean work space, limiting distractions, and double checking work as she goes vice at conclusion of task appear to be strategies which best serve pt in completing structured tasks. Able to double check work as she went today during previously attempted task which proved difficult, resulting in increased accuracy of 90% vs. previous accuacy of sub 50%. ID errors as made x3, corrects. Min-A to remove from sheet.   ?  ? Assessment / Recommendations / Plan  ? Plan Continue with current plan of care   ?  ? Progression Toward Goals  ? Progression toward goals Progressing toward goals   ? ?  ?  ? ?  ? ? ? ? ? ? SLP Short Term Goals - 10/11/21 1323   ? ?  ? SLP SHORT TERM GOAL #1  ? Title pt will complete standardized testing   ? Status Achieved   ? Target Date 08/25/21   ?  ? SLP SHORT TERM GOAL #2  ? Title Pt will verbalize 3 attention/memory compensations for making/tracking appointments, managing meds, and other daily tasks in 2 sessions   ? Baseline 08-23-21   ? Status Partially Met   ? Target Date 09/08/21   ?  ? SLP SHORT TERM GOAL #3  ? Title pt will verbally generate and carryover (with min A) solutions to  problems encountered in everyday living in 2 sessions   ? Baseline 08-23-21   ? Status Partially Met   ? Target Date 09/22/21   ?  ? SLP SHORT TERM GOAL #4  ? Title pt will verbally state errors on mod complex written or verbal tasks with nonverbal cues   ? Baseline 08-23-21   ? Status Partially Met   ? Target Date 09/22/21   ? ?  ?  ? ?  ? ? ? SLP Long Term Goals - 10/11/21 1323   ? ?  ? SLP LONG TERM GOAL #1  ? Title Pt will verbalize AND IMPLEMEMENT 3 attention/memory compensations for making/tracking appointments, managing meds, and other daily tasks in 4 sessions   ? Baseline 10-06-21   ? Status Partially Met   ?  ? SLP LONG TERM GOAL #2  ? Title Pt will score higher on a patient-reported outcome measure in the last 1-2 visits than when  taken in the first 2 sessions   ? Time 4   ? Period Weeks   ? Status On-going   ?  ? SLP LONG TERM GOAL #3  ? Title pt will verbally generate and independently carryover solutions to problems encountered in everyday living with min A in 4 sessions   ? Time 4   ? Period Weeks   ? Status On-going   ?  ? SLP LONG TERM GOAL #4  ? Title pt will note verbal or written errors in work-like tasks independently for improving verbal and/or written accuracy   ? Time 4   ? Period Weeks   ? Status On-going   ? ?  ?  ? ?  ? ? ? Plan - 10/11/21 1323   ? ? 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 Conducted education and instruction for implementation of energy conservation, memory/attention compensations, and functional cognitive linguistic tasks to target at home to maximize accuracy and performance of functional tasks at home. Her performance in some of these areas have flucutated as she progresses through chemo/rad tx due to temporary physiological changes to the brain. She will benefit from skilled ST targeting these deficits.   ? Speech Therapy Frequency 2x / week   ? Duration 12 weeks   pt may schedule x1/week for 6 weeks due to chemo/rad schedule  ? Treatment/Interventions Cognitive reorganization;Environmental controls;Internal/external aids;Compensatory strategies;Patient/family education;Functional tasks;Cueing hierarchy   ? Potential to Achieve Goals Good   ? Consulted and Agree with Plan of Care Patient   ? ?  ?  ? ?  ? ? ? ?Patient will benefit from skilled therapeutic intervention in order to improve the following deficits and impairments:   ?Cognitive communication deficit ? ?Dysarthria and anarthria ? ? ? ?Problem List ?Patient Active Problem List  ? Diagnosis Date Noted  ? Goals of care, counseling/discussion 07/20/2021  ? Glioblastoma with isocitrate  dehydrogenase gene wildtype ([place]) 07/12/2021  ? Brain mass 06/30/2021  ? Hypothyroidism 06/30/2021  ? Hypokalemia 06/30/2021  ? Essential hypertension 05/25/2021  ? Hyperglycemia 07/14/2011  ? Normocytic anemia 07/13/2011  ? ? ?Su Monks, CCC-SLP ?10/11/2021, 1:24 PM ? ?[facility] ?[address] ?[place], [place], [address] ?Phone: [phone]   Fax:  [phone] ? ? ?Name: Syndey Jaskolski [place] ?MRN: [id number] ?Date of Birth: 1950/08/11 ? ?

## 2021-10-11 NOTE — Progress Notes (Signed)
? ?                                                                                                                                                          ?  Patient Name: Denise Wright ?MRN: [id number] ?DOB: 1951-01-07 ?Referring Physician: Cecil Cobbs ?Date of Service: 09/15/2021 ?[facility] [place] ? ?                                                      End Of Treatment Note ? 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 ?Cancer Staging: IDH wild-type glioblastoma of the right temporal lobe ? ?Intent: Curative ? ?Radiation Treatment Dates: 08/07/2021 through 09/15/2021 ?Site Technique Total Dose (Gy) Dose per Fx (Gy) Completed Fx Beam Energies  ?Brain: Brain IMRT 46/46 2 23/23 6X  ?Brain: Brain_Bst IMRT 14/14 2 7/7 6X  ? ?Narrative: The patient tolerated radiation therapy relatively well.  She developed fatigue and anticipated skin changes in the treatment field.  ? ?Plan: The patient will receive a call in about one month from the radiation oncology department. She will continue follow up with Dr. Mickeal Skinner as well.  ? ?________________________________________________ ? ? ? ?Carola Rhine, PAC  ?

## 2021-10-12 NOTE — Research (Signed)
[id number] A Single Arm, Pilot Study of Ramipril for Preventing Radiation-Induced Cognitive Decline in Glioblastoma (GBM) Patients Receiving Brain Radiotherapy ? ?LATE ENTRY/ADDENDUM ? ?Upon further review of vital signs from 10/09/21 office visit with Dr. Mickeal Skinner determined patient to have additional adverse event (using CTCAE v. 5.0).  Patient had hypertension (Grade 1) of 132/70.  Patient has a baseline history of hypertension managed with medication.  Per provider this is unrelated to study drug, no intervention at this time, no action or reporting required. ? ?Wells Guiles 'Learta Codding' Mycheal Veldhuizen, RN, BSN ?Clinical Research Nurse I ?10/12/21 ?4:21 PM ? ?

## 2021-10-18 ENCOUNTER — Ambulatory Visit: Payer: BC Managed Care – PPO | Admitting: Physical Therapy

## 2021-10-18 ENCOUNTER — Encounter: Payer: BC Managed Care – PPO | Attending: Registered Nurse | Admitting: Physical Medicine & Rehabilitation

## 2021-10-18 ENCOUNTER — Ambulatory Visit: Payer: BC Managed Care – PPO | Admitting: Speech Pathology

## 2021-10-18 ENCOUNTER — Encounter: Payer: Self-pay | Admitting: Physical Therapy

## 2021-10-18 ENCOUNTER — Encounter: Payer: Self-pay | Admitting: Physical Medicine & Rehabilitation

## 2021-10-18 ENCOUNTER — Encounter: Payer: Self-pay | Admitting: Speech Pathology

## 2021-10-18 VITALS — BP 127/76 | HR 96 | Ht 62.5 in | Wt 226.0 lb

## 2021-10-18 VITALS — BP 167/92 | HR 85

## 2021-10-18 DIAGNOSIS — R471 Dysarthria and anarthria: Secondary | ICD-10-CM

## 2021-10-18 DIAGNOSIS — C719 Malignant neoplasm of brain, unspecified: Secondary | ICD-10-CM | POA: Insufficient documentation

## 2021-10-18 DIAGNOSIS — F411 Generalized anxiety disorder: Secondary | ICD-10-CM | POA: Insufficient documentation

## 2021-10-18 DIAGNOSIS — M6281 Muscle weakness (generalized): Secondary | ICD-10-CM

## 2021-10-18 DIAGNOSIS — R2689 Other abnormalities of gait and mobility: Secondary | ICD-10-CM

## 2021-10-18 DIAGNOSIS — R278 Other lack of coordination: Secondary | ICD-10-CM | POA: Diagnosis not present

## 2021-10-18 DIAGNOSIS — R41841 Cognitive communication deficit: Secondary | ICD-10-CM

## 2021-10-18 DIAGNOSIS — R2681 Unsteadiness on feet: Secondary | ICD-10-CM

## 2021-10-18 MED ORDER — ALPRAZOLAM 0.25 MG PO TABS: 0.2500 mg | ORAL_TABLET | Freq: Two times a day (BID) | ORAL | 0 refills | Status: DC | PRN

## 2021-10-18 NOTE — Patient Instructions (Signed)
? ? ?  Practice typing - type the sentences that you wrote ? ?Email to [email or web address] to practice e mailing   ? ?If that is too hard, bring in the typed sentences on paper ?

## 2021-10-18 NOTE — Patient Instructions (Signed)
PLEASE FEEL FREE TO CALL OUR OFFICE WITH ANY PROBLEMS OR QUESTIONS ([phone])      

## 2021-10-18 NOTE — Progress Notes (Addendum)
? ?Subjective:  ? ? Patient ID: Denise Wright, female    DOB: 03-21-1951, 71 y.o.   MRN: [id number] ? ?HPI ? ? ?Denise Wright is here in follow up of her GBM and for the purpose of a wheelchair evaluation. She continues to struggle with her balance and mobility. She walks only with assistance at home. She would benefit from use of light weight manual wheelchair in order to improve navigation of community distances and decrease caregiver burden.   ? ?She continues to be followed closely by Dr. Mickeal Skinner. She has been on steroids and adjuvant ctx has been on hold for the moment.  She feels that she is getting a bit stronger.   She still involved in outpatient speech therapy as.  She does feel that her phonation is improving and that she does tend still to drool. ? ?She complains of ongoing fatigue. She typically is getting about 10 hours of sleep at night although she's needing 2-3 hours of naps during the day.  She does feel rested when she first wakes up in the morning. ? ?She denies feeling moody although she can get a little anxious at night.  ? ?She has dealt with ongoing lower extremity edema.  She has been on diuretics previously.  Lasix was held recently due to worsening renal function.  She does wear bilateral knee-high compression stockings. ? ?Denise. Wright has interest in going back to work and she would like to drive again.  She asked when she might be able to do these. ? ? ? ?Pain Inventory ?Average Pain 9 ?Pain Right Now 0 ?My pain is intermittent, sharp, burning, dull, stabbing, tingling, and aching ? ?LOCATION OF PAIN  Left & Right side of head ? ?BOWEL ?Number of stools per week: 3 ?Oral laxative use Yes  ?Type of laxative Colace ? ?BLADDER ?Normal and Pads ? ?Mobility ?walk with assistance ?use a walker ?how many minutes can you walk? 2 -4 minutes ?ability to climb steps?  yes ?do you drive?  no ?Do you have any goals in this area?  yes ? 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  yes ? ?Neuro/Psych ?trouble walking ? ?Prior Studies ?Any changes since last visit?  yes ?CT/MRI  by Oncology ? ?Physicians involved in your care ?Any changes since last visit?  no ? ? ?Family History  ?Problem Relation Age of Onset  ? Diabetes Mother   ? Hypertension Mother   ? Hypertension Father   ? Leukemia Father   ? Stroke Father   ? Cancer Brother   ? Pneumonia Brother   ? ?Social History  ? ?Socioeconomic History  ? Marital status: Divorced  ?  Spouse name: Not on file  ? Number of children: 3  ? Years of education: 74  ? Highest education level: Not on file  ?Occupational History  ? Occupation: Masters degree in public service  ?  Employer: [employer]  ? Occupation: Working on Engineer, maintenance  ? Occupation: Training and development officer of [employer]  ?Tobacco Use  ? Smoking status: Never  ? Smokeless tobacco: Never  ?Vaping Use  ? Vaping Use: Never used  ?Substance and Sexual Activity  ? Alcohol use: No  ? Drug use: No  ? Sexual activity: Yes  ?  Birth control/protection: Surgical  ?Other  Topics Concern  ? Not on file  ?Social History Narrative  ? Divorced, lives in [place] alone.  Independent of ADLs.  ? ?Social Determinants of Health  ? ?Financial Resource Strain: Not on file  ?Food Insecurity: Not on file  ?Transportation Needs: Not on file  ?Physical Activity: Not on file  ?Stress: Not on file  ?Social Connections: Not on file  ? ?Past Surgical History:  ?Procedure Laterality Date  ? ABDOMINAL HYSTERECTOMY  1983  ? APPLICATION OF CRANIAL NAVIGATION Right 07/06/2021  ? Procedure: APPLICATION OF CRANIAL NAVIGATION;  Surgeon: Dawley, Theodoro Doing, DO;  Location: [facility];  Service: Neurosurgery;  Laterality: Right;  ? CRANIOTOMY Right 07/06/2021  ? Procedure: CRANIOTOMY TUMOR EXCISION;  Surgeon: Karsten Ro, DO;  Location: [facility];   Service: Neurosurgery;  Laterality: Right;  ? TONSILLECTOMY  1965  ? TOOTH EXTRACTION    ? ?Past Medical History:  ?Diagnosis Date  ? Hyperlipidemia   ? Hypertension   ? Pneumonia   ? Thyroid disease   ? ?BP 127/76   Pulse 96   Ht 5' 2.5" (1.588 m)   Wt 226 lb (102.5 kg)   SpO2 98%   BMI 40.68 kg/m?  ? ?Opioid Risk Score:   ?Fall Risk Score:  `1 ? ?Depression screen PHQ 2/9 ? ? ?  10/18/2021  ?  2:34 PM 08/01/2021  ?  1:49 PM  ?Depression screen PHQ 2/9  ?Decreased Interest 0 0  ?Down, Depressed, Hopeless 0 0  ?PHQ - 2 Score 0 0  ?Altered sleeping  0  ?Tired, decreased energy  1  ?Change in appetite  0  ?Feeling bad or failure about yourself   0  ?Trouble concentrating  0  ?Moving slowly or fidgety/restless  1  ?Suicidal thoughts  0  ?PHQ-9 Score  2  ? ? ?Review of Systems  ?Constitutional:  Positive for unexpected weight change.  ?     Weight gain  ?Respiratory:  Positive for shortness of breath and wheezing.   ?Cardiovascular:  Positive for leg swelling.  ?     Legs & feet swelling  ?Neurological:  Positive for dizziness, weakness and headaches.  ?All other systems reviewed and are negative. ? ?   ?Objective:  ? Physical Exam ?Gen: no distress, normal appearing ?HEENT: oral mucosa pink and moist, NCAT ?Cardio: Reg rate ?Chest: normal effort, normal rate of breathing ?Abd: soft, non-distended ?Ext: no edema ?Psych: pleasant, normal affect ?Skin: intact ?Neuro: speech slightly dysarthric but improve phonation. Alert and oriented x 3. Normal insight and awareness. Intact Memory. Normal language and speech. Cranial nerve exam unremarkable. Slow reacti0n time. Motor 4/5. Shuffling gait ?Musculoskeletal: Full ROM, No pain with AROM or PROM in the neck, trunk, or extremities. Posture appropriate    ? ? ? ?   ?Assessment & Plan:  ? ?1. 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   ?-  Decadron and ctx plan per-Dr. Mickeal Skinner  ?-We  discussed multiple issues today.  She has continued to make progress since leaving inpatient rehab.  She is not at a point where she is ready for driving or vocational reentry.  I told her I wanted to focus on her medical treatment plan as well as increasing her physical stamina.  We can discuss these matters more when I see her back in follow-up. ?-ordered a light weight wheelchair today ?2.  Pain Management: Hydrocodone as needed for headaches ?4. Mood: Xanax 0.25  mg twice daily as needed at bedtime ?            -Refilled #15 today ? -Mood is generally positive and family is very supportive ?5.  .  Seizure prophylaxis.  continue Keppra 500 mg twice daily ?6.  Hypertension/lower ext edema.   ?            -lasix stopped due to AKI ?-TEDs/ ACE ?10.  Hypothyroidism.  Synthroid ?11.  Normocytic anemia.   ?12.  Recent multiple teeth extraction 06/16/2021.  Continue Clindamycin 300 mg every 8 hours ?14.   Dysphonia/drooling: ?            -improving ?  -Continue speech language pathology as an outpatient ? ?Fifteen minutes of face to face patient care time were spent during this visit. All questions were encouraged and answered.  Follow up with me in 3 months.  ?

## 2021-10-18 NOTE — Therapy (Signed)
?OUTPATIENT PHYSICAL THERAPY TREATMENT NOTE ? ? ?Patient Name: Denise Wright ?MRN: [id number] ?DOB:May 11, 1951, 71 y.o., female ?Today's Date: 10/18/2021 ? ?PCP: Willey Blade, MD ?REFERRING PROVIDER: Cathlyn Parsons, PA-C ? ? PT End of Session - 10/18/21 1231   ? ? Visit Number 11   ? Number of Visits 15   ? Date for PT Re-Evaluation 10/20/21   ? Authorization Type BCBS COMM PPO   ? Progress Note Due on Visit 10   ? PT Start Time 1150   received from [place].  ? PT Stop Time 2409   ? PT Time Calculation (min) 39 min   ? Equipment Utilized During Treatment Gait belt   ? Activity Tolerance Patient tolerated treatment well   ? Behavior During Therapy [facility] for tasks assessed/performed   Pt is lethargic today  ? ?  ?  ? ?  ? ? ? ? ?Past Medical History:  ?Diagnosis Date  ? Hyperlipidemia   ? Hypertension   ? Pneumonia   ? Thyroid disease   ? ?Past Surgical History:  ?Procedure Laterality Date  ? ABDOMINAL HYSTERECTOMY  1983  ? APPLICATION OF CRANIAL NAVIGATION Right 07/06/2021  ? Procedure: APPLICATION OF CRANIAL NAVIGATION;  Surgeon: Dawley, Theodoro Doing, DO;  Location: [place];  Service: Neurosurgery;  Laterality: Right;  ? CRANIOTOMY Right 07/06/2021  ? Procedure: CRANIOTOMY TUMOR EXCISION;  Surgeon: Karsten Ro, DO;  Location: [place];  Service: Neurosurgery;  Laterality: Right;  ? TONSILLECTOMY  1965  ? TOOTH EXTRACTION    ? ?Patient Active Problem List  ? Diagnosis Date Noted  ? Goals of care, counseling/discussion 07/20/2021  ? 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 Brain mass 06/30/2021  ? Hypothyroidism 06/30/2021  ? Hypokalemia 06/30/2021  ? Essential hypertension 05/25/2021  ? Hyperglycemia 07/14/2011  ? Normocytic anemia 07/13/2011  ? ? ?REFERRING DIAG: D49.6 (ICD-10-CM) - Brain tumor ([place])  ? ?THERAPY DIAG:  ?Other lack of coordination ? ?Unsteadiness on feet ? ?Other abnormalities of gait and mobility ? ?Muscle weakness (generalized) ? ?PERTINENT HISTORY: HTN, hypothyroidism,  hyperglycemia, normocytic anemia, glioblastoma with isocitrate dehydrogenase gene wildtype ? ?PRECAUTIONS: Fall ? ?SUBJECTIVE: Pt is getting into bed much better, but is struggling with long distances in community.  She would like to look into a light weight wheelchair. ? ?PAIN:  ?Are you having pain? No ?VITALS:  R BP assessed in sitting: ?Today's Vitals  ? 10/18/21 1205 10/18/21 1229  ?BP: 130/80 (!) 167/92  ?Pulse: 88 85  ?Last BP taken end of session following symptoms during ambulation. ? ?TODAY'S TREATMENT: ?PT supervises as Pt ambulates into gym 110' from speech therapy to gym SBA-CGA as pt leans laterally to left with intermittent bump into left sided obstacles, no LOB.  Cued prior to obstacles to promote attention. ? ?See edu for initiation of session. ? ?Pt negotiates stairs ascending and descending with step-to pattern, up with the good (RLE) and down with the bad (LLE) w/ min cuing, use of R rail only.  Completes 8x6" steps. ? ?Pt ambulates x134' prior to reporting BLE weakness and sudden bout of feeling dizzy and winded. Rest x1 min with resolution of symptoms with resume of ambulation to mat table x62' to assess BP. ? ? ?PATIENT EDUCATION: ?Education details: Discussion about upcoming d/c plan with pt and daughter in agreement.  Discussed out-of-pocket transport chair options vs manual chair order from MD to promote community mobility.  Discussed returning to community exercise program to pt tolerance, considering seated  exercise classes, with supervision.  Pt has not reached out to instructor yet.  Confirmed home setup/transfers are intermittently going well, bed is consistently better as of late.  Daughter feels comfortable with resources previously provided for home modification and grab bars recently installed in pt bathroom. ?Person educated: Patient, daughter ?Education method: Explanation ?Education comprehension: verbalized understanding ? ?HEP: ?Access Code: [email or web address] ?Initiated walking program  09/01/2021 with supervision in home environment/neighborhood on level paved surface w/ or w/o AD for endurance. ? ?OBJECTIVE:  ?  ?DIAGNOSTIC FINDINGS: Pt had MRI prior to surgical resection of tumor ?  ?COGNITION: ?Overall cognitive status: Within functional limits for tasks assessed ?  ?            ?SENSATION: ?Light touch: Appears intact ?Hot/Cold: Appears intact-pt reports no difficulty noting difference in shower and when washing hands ?  ?COORDINATION: ?Fingertip opposition: normal bilaterally ?Heel-to-shin:  normal bilaterally; inc time ?  ?EDEMA:  ?Mild bilateral LEs-pt states she has had this for a while now. ?  ?MUSCLE TONE: None noted in LE ?  ?POSTURE: rounded shoulders, forward head, and increased thoracic kyphosis ?  ?AROM/PROM:   ?  ?AROM Right ?08/11/2021 Left ?08/11/2021  ?Hip flexion [facility] WFL  ?Hip extension " "  ?Hip abduction " "  ?Hip adduction " "  ?Hip internal rotation " "  ?Hip external rotation " "  ?Knee flexion " "  ?Knee extension " "  ?Ankle dorsiflexion " "  ?Ankle plantarflexion " "  ? (Blank rows = not tested) ?MMT: ?Left shoulder 4-/5 ?MMT Right ?08/11/2021 Left ?08/11/2021  ?Hip flexion 4/5 3/5  ?Hip extension      ?Hip abduction 4/5 4/5  ?Hip adduction 5/5 5/5  ?Hip internal rotation      ?Hip external rotation      ?Knee flexion 4-/5 3+/5  ?Knee extension 4+/5 4+/5  ?Ankle dorsiflexion 5/5 4+/5; no pain  ?Ankle plantarflexion      ?Ankle inversion      ?Ankle eversion      ?(Blank rows = not tested) ?  ?TRANSFERS: ?Assistive device utilized: Environmental consultant - 2 wheeled  ?Sit to stand: Modified independence ?Stand to sit: Modified independence ?  ?STAIRS: 6", 4 step up using bilateral rails, step-to pattern, up with LLE, down with RLE.  Edu on safe stair negotiation, "up with the good, down with the bad" to prevent falls on steps into home.   ?  ?GAIT: ?Gait pattern: step to pattern, decreased stride length, and trunk flexed ?Distance walked: Several bouts in and around gym for balance and fall risk  assessments. ?Assistive device utilized: Environmental consultant - 2 wheeled ?Level of assistance: SBA ?  ?FUNCTIONAL TESTs:  ?5 times sit to stand: 15.91 sec ?Timed up and go (TUG): 19.53 sec w/ RW ?10 meter walk test: (normal)=0.56 m/sec or 1.3f/sec; (Fast)=.77 m/sec or 2.56 ft/sec  w/ RW  ?  ?ASSESSMENT: ?  ?CLINICAL IMPRESSION: ?Began assessing LTGs this session with pt demonstrating some improvement in both tolerance for stair negotiation and balance using step-to pattern and R rail.  Pt is limited by waivering activity tolerance most noted during ambulation at end of session with onset of light headedness with increased BP.  Discussion with patient and daughter regarding wheelchair order to address community mobility limitations and returning to regular exercise in community to promote maintenance of gains in therapy.  Will continue to assess goals at next session in preparation for discharge to allow patient and family to transition to self-management of  care. ?  ?  ?  ?OBJECTIVE IMPAIRMENTS Abnormal gait, decreased activity tolerance, decreased balance, decreased endurance, decreased mobility, difficulty walking, decreased strength, and increased edema.  ?  ?ACTIVITY LIMITATIONS cleaning, community activity, driving, meal prep, occupation, laundry, yard work, and shopping.  ?  ?PERSONAL FACTORS HTN, hypothyroidism, hyperglycemia are also affecting patient's functional outcome.  ?  ?  ?REHAB POTENTIAL: Good ?  ?CLINICAL DECISION MAKING: Evolving/moderate complexity ?  ?EVALUATION COMPLEXITY: Moderate ?  ?  ?GOALS: ?Goals reviewed with patient? Yes ?  ?SHORT TERM GOALS: ?  ?Pt will be independent with strength and balance HEP with supervision from family. ?Baseline: to be initiated at second visit ?Target date: 09/15/2021 ?Goal status: MET ?  ?2.  Pt will decrease 5xSTS to <13 seconds in order to demonstrate decreased risk for falls and improved functional bilateral LE strength and power. ?Baseline: 15.91 sec; 09/15/2021 12.10  seconds ?Target date: 09/15/2021 ?Goal status: MET ?  ?3.  Pt will demonstrate TUG of <16 seconds in order to decrease risk of falls and improve functional mobility using LRAD. ?Baseline: 19.53 seconds;

## 2021-10-18 NOTE — Therapy (Signed)
[facility] ?[place], [place], [address] ?Phone: [phone]   Fax:  [phone] ? ?Speech Language Pathology Treatment ? ?Patient Details  ?Name: Denise Wright ?MRN: [id number] ?Date of Birth: 05/31/1951 ?Referring Provider (SLP): Lauraine Rinne, PA-C ? ? ?Encounter Date: 10/18/2021 ? ? End of Session - 10/18/21 1105   ? ? Visit Number 12   ? Number of Visits 25   ? Date for SLP Re-Evaluation 11/09/21   ? Authorization Type BCBS   ? SLP Start Time 1103   ? SLP Stop Time  1145   ? SLP Time Calculation (min) 42 min   ? ?  ?  ? ?  ? ? ?Past Medical History:  ?Diagnosis Date  ? Hyperlipidemia   ? Hypertension   ? Pneumonia   ? Thyroid disease   ? ? ?Past Surgical History:  ?Procedure Laterality Date  ? ABDOMINAL HYSTERECTOMY  1983  ? APPLICATION OF CRANIAL NAVIGATION Right 07/06/2021  ? Procedure: APPLICATION OF CRANIAL NAVIGATION;  Surgeon: Dawley, Theodoro Doing, DO;  Location: [place];  Service: Neurosurgery;  Laterality: Right;  ? CRANIOTOMY Right 07/06/2021  ? Procedure: CRANIOTOMY TUMOR EXCISION;  Surgeon: Karsten Ro, DO;  Location: [place];  Service: Neurosurgery;  Laterality: Right;  ? TONSILLECTOMY  1965  ? TOOTH EXTRACTION    ? ? ?There were no vitals filed for this visit. ? ? Subjective Assessment - 10/18/21 1200   ? ? Subjective "I'm a little tire"   ? Patient is accompained by: Family member   ? Currently in Pain? No/denies   ? ?  ?  ? ?  ? ? ? ? ? ? ? ? ADULT SLP TREATMENT - 10/18/21 1109   ? ?  ? General Information  ? Behavior/Cognition Alert;Cooperative;Pleasant mood;Lethargic;Requires cueing;Confused   ?  ? Treatment Provided  ? Treatment provided Cognitive-Linquistic   ?  ? Cognitive-Linquistic Treatment  ? Treatment focused on Cognition;Patient/family/caregiver education   ? Skilled Treatment Denise Wright continues have more success with pm meds. She is using calendar but continues to have difficulty recalling day. She is recalling  appoinments that she reviews with family the night before. Targeted attention, organization and error awareness in financial task writing her monthly bills and use calculator to find total. She required mod A to Id entry errors and to reason that based on her bills, her calculated total was too high. She self corrected with usual mod Denise Wright ID'd goal to return to work. Targeted awareness by generating 4 changes that prevent her from returning to work at this time. She stated vision, energy level and ability to focus on Zoom meeting for greather than 30 min is difficult. She required cues to ID reduced typing, processing, fine motor [place] asked about driving. We reviewed her self ID'd impairments also affect driving ability,   ?  ? Assessment / Recommendations / Plan  ? Plan Continue with current plan of care   ?  ? Progression Toward Goals  ? Progression toward goals Progressing toward goals   ? ?  ?  ? ?  ? ? ? ? ? SLP Short Term Goals - 10/11/21 1323   ? ?  ? SLP SHORT TERM GOAL #1  ? Title pt will complete standardized testing   ? Status Achieved   ? Target Date 08/25/21   ?  ? SLP SHORT TERM GOAL #2  ? Title Pt will verbalize 3 attention/memory compensations for making/tracking appointments, managing meds,  and other daily tasks in 2 sessions   ? Baseline 08-23-21   ? Status Partially Met   ? Target Date 09/08/21   ?  ? SLP SHORT TERM GOAL #3  ? Title pt will verbally generate and carryover (with min A) solutions to problems encountered in everyday living in 2 sessions   ? Baseline 08-23-21   ? Status Partially Met   ? Target Date 09/22/21   ?  ? SLP SHORT TERM GOAL #4  ? Title pt will verbally state errors on mod complex written or verbal tasks with nonverbal cues   ? Baseline 08-23-21   ? Status Partially Met   ? Target Date 09/22/21   ? ?  ?  ? ?  ? ? ? SLP Long Term Goals - 10/11/21 1323   ? ?  ? SLP LONG TERM GOAL #1  ? Title Pt will verbalize AND IMPLEMEMENT 3 attention/memory compensations for  making/tracking appointments, managing meds, and other daily tasks in 4 sessions   ? Baseline 10-06-21   ? Status Partially Met   ?  ? SLP LONG TERM GOAL #2  ? Title Pt will score higher on a patient-reported outcome measure in the last 1-2 visits than when taken in the first 2 sessions   ? Time 4   ? Period Weeks   ? Status On-going   ?  ? SLP LONG TERM GOAL #3  ? Title pt will verbally generate and independently carryover solutions to problems encountered in everyday living with min A in 4 sessions   ? Time 4   ? Period Weeks   ? Status On-going   ?  ? SLP LONG TERM GOAL #4  ? Title pt will note verbal or written errors in work-like tasks independently for improving verbal and/or written accuracy   ? Time 4   ? Period Weeks   ? Status On-going   ? ?  ?  ? ?  ? ? ? ? ?Patient will benefit from skilled therapeutic intervention in order to improve the following deficits and impairments:   ?Cognitive communication deficit ? ?Dysarthria and anarthria ? ? ? ?Problem List ?Patient Active Problem List  ? Diagnosis Date Noted  ? Goals of care, counseling/discussion 07/20/2021  ? 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 Brain mass 06/30/2021  ? Hypothyroidism 06/30/2021  ? Hypokalemia 06/30/2021  ? Essential hypertension 05/25/2021  ? Hyperglycemia 07/14/2011  ? Normocytic anemia 07/13/2011  ? ? ?Darean Rote, Annye Rusk, CCC-SLP ?10/18/2021, 12:00 PM ? ?[facility] ?[place], [place], [address] ?Phone: [phone]   Fax:  [phone] ? ? ?Name: Denise Wright ?MRN: [id number] ?Date of Birth: 02-08-1951 ? ?

## 2021-10-20 ENCOUNTER — Ambulatory Visit: Payer: BC Managed Care – PPO

## 2021-10-20 ENCOUNTER — Ambulatory Visit: Payer: BC Managed Care – PPO | Admitting: Physical Therapy

## 2021-10-20 ENCOUNTER — Telehealth: Payer: Self-pay | Admitting: Physical Therapy

## 2021-10-20 VITALS — BP 121/73 | HR 87

## 2021-10-20 DIAGNOSIS — R2681 Unsteadiness on feet: Secondary | ICD-10-CM

## 2021-10-20 DIAGNOSIS — R278 Other lack of coordination: Secondary | ICD-10-CM

## 2021-10-20 DIAGNOSIS — R41841 Cognitive communication deficit: Secondary | ICD-10-CM

## 2021-10-20 DIAGNOSIS — M6281 Muscle weakness (generalized): Secondary | ICD-10-CM

## 2021-10-20 DIAGNOSIS — R2689 Other abnormalities of gait and mobility: Secondary | ICD-10-CM

## 2021-10-20 NOTE — Telephone Encounter (Signed)
Dr. Naaman Plummer, ?Cyndra Worrel was being treated by physical therapy for imbalance, strength, and endurance following care for brain tumor.  She will benefit from use of light weight manual wheelchair in order to improve navigation of community distances and decrease caregiver burden.   ? ?If you agree, please have your office set up an appt for her to be seen by you for a face-to-face that is required and then submit request in EPIC under MD Order, Other Orders (list light weight wheelchair in comments) or fax to [facility] at [phone].  ? ?Thank you, ?Elease Etienne, PT, DPT ? ?[facility] [address] ?[place], [place]  [address] ?Phone:  [phone] ?Fax:  [phone] ? ? ?

## 2021-10-20 NOTE — Patient Instructions (Addendum)
Establishing a routine: ? -Homework 10:30-12:30 (work in 30 minute increments - 10/15 minute break between) ? -Nap 2-4pm (Put this into your calendar if you find yourself not staying on schedule) ? ?Check off the days you have completed routine above  ?Saturday   ?Sunday    ?Monday   ?Tuesday   ?Wednesday   ? ?  ? ?

## 2021-10-20 NOTE — Therapy (Signed)
?OUTPATIENT PHYSICAL THERAPY TREATMENT NOTE ? ? ?Patient Name: Denise Wright ?MRN: [id number] ?DOB:26-Sep-1950, 71 y.o., female ?Today's Date: 10/20/2021 ? ?PCP: Willey Blade, MD ?REFERRING PROVIDER: Cathlyn Parsons, PA-C ? ? PT End of Session - 10/20/21 1318   ? ? Visit Number 12   ? Number of Visits 15   ? Date for PT Re-Evaluation 10/20/21   ? Authorization Type BCBS COMM PPO   ? Progress Note Due on Visit 10   ? PT Start Time 1315   ? PT Stop Time 1340   ? PT Time Calculation (min) 25 min   ? Equipment Utilized During Treatment Gait belt   ? Activity Tolerance Patient tolerated treatment well   ? Behavior During Therapy [facility] for tasks assessed/performed   Pt is lethargic today  ? ?  ?  ? ?  ? ? ? ? ?Past Medical History:  ?Diagnosis Date  ? Hyperlipidemia   ? Hypertension   ? Pneumonia   ? Thyroid disease   ? ?Past Surgical History:  ?Procedure Laterality Date  ? ABDOMINAL HYSTERECTOMY  1983  ? APPLICATION OF CRANIAL NAVIGATION Right 07/06/2021  ? Procedure: APPLICATION OF CRANIAL NAVIGATION;  Surgeon: Dawley, Theodoro Doing, DO;  Location: [place];  Service: Neurosurgery;  Laterality: Right;  ? CRANIOTOMY Right 07/06/2021  ? Procedure: CRANIOTOMY TUMOR EXCISION;  Surgeon: Karsten Ro, DO;  Location: [place];  Service: Neurosurgery;  Laterality: Right;  ? TONSILLECTOMY  1965  ? TOOTH EXTRACTION    ? ?Patient Active Problem List  ? Diagnosis Date Noted  ? Generalized anxiety disorder 10/18/2021  ? Goals of care, counseling/discussion 07/20/2021  ? 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 Brain mass 06/30/2021  ? Hypothyroidism 06/30/2021  ? Hypokalemia 06/30/2021  ? Essential hypertension 05/25/2021  ? Hyperglycemia 07/14/2011  ? Normocytic anemia 07/13/2011  ? ? ?REFERRING DIAG: D49.6 (ICD-10-CM) - Brain tumor ([place])  ? ?THERAPY DIAG:  ?Other lack of coordination ? ?Unsteadiness on feet ? ?Other abnormalities of gait and mobility ? ?Muscle weakness (generalized) ? ?PERTINENT  HISTORY: HTN, hypothyroidism, hyperglycemia, normocytic anemia, glioblastoma with isocitrate dehydrogenase gene wildtype ? ?PRECAUTIONS: Fall ? ?SUBJECTIVE: Things are still okay functionally at home.  She is tired everyday.  No falls recently. ? ?PAIN:  ?Are you having pain? No ?VITALS:  R BP assessed in sitting: ?Today's Vitals  ? 10/20/21 1321  ?BP: 121/73  ?Pulse: 87  ? ?TODAY'S TREATMENT: ? Assessed LTGs.  See goal section and clinical impression statement. ? [facility] PT Assessment - 10/20/21 1325   ? ?  ? Berg Balance Test  ? Sit to Stand Able to stand without using hands and stabilize independently   ? Standing Unsupported Able to stand safely 2 minutes   ? Sitting with Back Unsupported but Feet Supported on Floor or Stool Able to sit safely and securely 2 minutes   ? Stand to Sit Sits safely with minimal use of hands   ? Transfers Able to transfer safely, minor use of hands   ? Standing Unsupported with Eyes Closed Able to stand 10 seconds safely   ? Standing Unsupported with Feet Together Able to place feet together independently and stand 1 minute safely   ? From Standing, Reach Forward with Outstretched Arm Can reach confidently >25 cm (10")   ? From Standing Position, Pick up Object from Floor Able to pick up shoe, needs supervision   ? From Standing Position, Turn to Look Behind Over each Shoulder Looks  behind from both sides and weight shifts well   ? Turn 360 Degrees Able to turn 360 degrees safely but slowly   ? Standing Unsupported, Alternately Place Feet on Step/Stool Able to stand independently and complete 8 steps >20 seconds   ? Standing Unsupported, One Foot in Front Able to take small step independently and hold 30 seconds   ? Standing on One Leg Tries to lift leg/unable to hold 3 seconds but remains standing independently   ? Total Score 47   ? ?  ?  ? ?  ? ? ? ?PATIENT EDUCATION: ?Education details: Discussed D/C plan for today and w/c order. ?Person educated: Patient, daughter ?Education  method: Explanation ?Education comprehension: verbalized understanding ? ?HEP: ?Access Code: [email or web address] ?Initiated walking program 09/01/2021 with supervision in home environment/neighborhood on level paved surface w/ or w/o AD for endurance. ? ?OBJECTIVE:  ?  ?DIAGNOSTIC FINDINGS: Pt had MRI prior to surgical resection of tumor ?  ?COGNITION: ?Overall cognitive status: Within functional limits for tasks assessed ?  ?            ?SENSATION: ?Light touch: Appears intact ?Hot/Cold: Appears intact-pt reports no difficulty noting difference in shower and when washing hands ?  ?COORDINATION: ?Fingertip opposition: normal bilaterally ?Heel-to-shin:  normal bilaterally; inc time ?  ?EDEMA:  ?Mild bilateral LEs-pt states she has had this for a while now. ?  ?MUSCLE TONE: None noted in LE ?  ?POSTURE: rounded shoulders, forward head, and increased thoracic kyphosis ?  ?AROM/PROM:   ?  ?AROM Right ?08/11/2021 Left ?08/11/2021  ?Hip flexion [facility] WFL  ?Hip extension " "  ?Hip abduction " "  ?Hip adduction " "  ?Hip internal rotation " "  ?Hip external rotation " "  ?Knee flexion " "  ?Knee extension " "  ?Ankle dorsiflexion " "  ?Ankle plantarflexion " "  ? (Blank rows = not tested) ?MMT: ?Left shoulder 4-/5 ?MMT Right ?08/11/2021 Left ?08/11/2021  ?Hip flexion 4/5 3/5  ?Hip extension      ?Hip abduction 4/5 4/5  ?Hip adduction 5/5 5/5  ?Hip internal rotation      ?Hip external rotation      ?Knee flexion 4-/5 3+/5  ?Knee extension 4+/5 4+/5  ?Ankle dorsiflexion 5/5 4+/5; no pain  ?Ankle plantarflexion      ?Ankle inversion      ?Ankle eversion      ?(Blank rows = not tested) ?  ?TRANSFERS: ?Assistive device utilized: Environmental consultant - 2 wheeled  ?Sit to stand: Modified independence ?Stand to sit: Modified independence ?  ?STAIRS: 6", 4 step up using bilateral rails, step-to pattern, up with LLE, down with RLE.  Edu on safe stair negotiation, "up with the good, down with the bad" to prevent falls on steps into home.   ?  ?GAIT: ?Gait pattern: step  to pattern, decreased stride length, and trunk flexed ?Distance walked: Several bouts in and around gym for balance and fall risk assessments. ?Assistive device utilized: Environmental consultant - 2 wheeled ?Level of assistance: SBA ?  ?FUNCTIONAL TESTs:  ?5 times sit to stand: 15.91 sec ?Timed up and go (TUG): 19.53 sec w/ RW ?10 meter walk test: (normal)=0.56 m/sec or 1.29f/sec; (Fast)=.77 m/sec or 2.56 ft/sec  w/ RW  ?  ?ASSESSMENT: ?  ?CLINICAL IMPRESSION: ?Finished assessing LTGs this session with pt demonstrating progress with BERG score of 47/56 placing her in a moderate fall risk category.  She had difficulty ambulating for prolonged distances this session only able to  ambulate 64' during 2MWT and 230' continuously.  From last session she has improved her stair negotiation ambulating up and down 8 stairs using a right rail and step-to pattern.  Her HEP remains adequately challenging as she continues to struggle with some balance and gait deficits including left inattention.  She continues to waiver in activity tolerance from session-to-session and discussed plateau with pt and daughter as good place to discharge and maintain progress at home.  They are in agreement to plan.  PT to place request for order for lightweight manual wheelchair to promote community access. ?  ?  ?OBJECTIVE IMPAIRMENTS Abnormal gait, decreased activity tolerance, decreased balance, decreased endurance, decreased mobility, difficulty walking, decreased strength, and increased edema.  ?  ?ACTIVITY LIMITATIONS cleaning, community activity, driving, meal prep, occupation, laundry, yard work, and shopping.  ?  ?PERSONAL FACTORS HTN, hypothyroidism, hyperglycemia are also affecting patient's functional outcome.  ?  ?  ?REHAB POTENTIAL: Good ?  ?CLINICAL DECISION MAKING: Evolving/moderate complexity ?  ?EVALUATION COMPLEXITY: Moderate ?  ?  ?GOALS: ?Goals reviewed with patient? Yes ?  ?SHORT TERM GOALS: ?  ?Pt will be independent with strength and  balance HEP with supervision from family. ?Baseline: to be initiated at second visit ?Target date: 09/15/2021 ?Goal status: MET ?  ?2.  Pt will decrease 5xSTS to <13 seconds in order to demonstrate decreased risk for fall

## 2021-10-20 NOTE — Therapy (Signed)
[facility] ?[address] ?[place], [place], [address] ?Phone: [phone]   Fax:  [phone] ? ?Speech Language Pathology Treatment ? ?Patient Details  ?Name: Denise Wright ?MRN: [id number] ?Date of Birth: 25-Mar-1951 ?Referring Provider (SLP): Lauraine Rinne, PA-C ? ? ?Encounter Date: 10/20/2021 ? ? End of Session - 10/20/21 1230   ? ? Visit Number 13   ? Number of Visits 25   ? Date for SLP Re-Evaluation 11/09/21   ? Authorization Type BCBS   ? SLP Start Time 1232   ? SLP Stop Time  1315   ? SLP Time Calculation (min) 43 min   ? Activity Tolerance Patient tolerated treatment well   ? ?  ?  ? ?  ? ? ?Past Medical History:  ?Diagnosis Date  ? Hyperlipidemia   ? Hypertension   ? Pneumonia   ? Thyroid disease   ? ? ?Past Surgical History:  ?Procedure Laterality Date  ? ABDOMINAL HYSTERECTOMY  1983  ? APPLICATION OF CRANIAL NAVIGATION Right 07/06/2021  ? Procedure: APPLICATION OF CRANIAL NAVIGATION;  Surgeon: Dawley, Theodoro Doing, DO;  Location: [facility];  Service: Neurosurgery;  Laterality: Right;  ? CRANIOTOMY Right 07/06/2021  ? Procedure: CRANIOTOMY TUMOR EXCISION;  Surgeon: Karsten Ro, DO;  Location: [facility];  Service: Neurosurgery;  Laterality: Right;  ? TONSILLECTOMY  1965  ? TOOTH EXTRACTION    ? ? ?There were no vitals filed for this visit. ? ? Subjective Assessment - 10/20/21 1232   ? ? Subjective "I'm training"   ? Patient is accompained by: Family member   ? Currently in Pain? No/denies   ? ?  ?  ? ?  ? ? ? ? ? ? ? ? ADULT SLP TREATMENT - 10/20/21 1229   ? ?  ? General Information  ? Behavior/Cognition Alert;Cooperative;Pleasant mood;Lethargic;Requires cueing;Confused   ?  ? Treatment Provided  ? Treatment provided Cognitive-Linquistic   ?  ? Cognitive-Linquistic Treatment  ? Treatment focused on Cognition;Patient/family/caregiver education   ? Skilled Treatment Pt stated "I'm in training" for implementing new nap schedule. SLP assisted with patient  establishing routine that was obtainable with check list provided to aid accountability and recall. 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 SLP reviewed how to use calendar and external visual aids to aid orientation and recall of specific events with intermittent min to mod A for accuracy.  ?  ? Assessment / Recommendations / Plan  ? Plan Continue with current plan of care   ?  ? Progression Toward Goals  ? Progression toward goals Progressing toward goals - slow progression  ? ?  ?  ? ?  ? ? ? SLP Education - 10/20/21 1317   ? ? Education Details routine, calendar   ? Person(s) Educated Patient;Child(ren)   ? Methods Explanation;Demonstration;Verbal cues;Handout   ? Comprehension Verbalized understanding;Returned demonstration;Need further instruction   ? ?  ?  ? ?  ? ? ? SLP Short Term Goals - 10/11/21 1323   ? ?  ? SLP SHORT TERM GOAL #1  ? Title pt will complete standardized testing   ? Status Achieved   ? Target Date 08/25/21   ?  ? SLP SHORT TERM GOAL #2  ? Title Pt will verbalize 3 attention/memory compensations for making/tracking appointments, managing meds, and other daily tasks in 2 sessions   ? Baseline 08-23-21   ? Status Partially Met   ?  Target Date 09/08/21   ?  ? SLP SHORT TERM GOAL #3  ? Title pt will verbally generate and carryover (with min A) solutions to problems encountered in everyday living in 2 sessions   ? Baseline 08-23-21   ? Status Partially Met   ? Target Date 09/22/21   ?  ? SLP SHORT TERM GOAL #4  ? Title pt will verbally state errors on mod complex written or verbal tasks with nonverbal cues   ? Baseline 08-23-21   ? Status Partially Met   ? Target Date 09/22/21   ? ?  ?  ? ?  ? ? ? SLP Long Term Goals - 10/20/21 1230   ? ?  ? SLP LONG TERM GOAL #1  ? Title Pt will verbalize AND IMPLEMEMENT 3 attention/memory compensations for making/tracking appointments, managing meds, and other daily tasks in 4 sessions    ? Baseline 10-06-21   ? Status Partially Met   ?  ? SLP LONG TERM GOAL #2  ? Title Pt will score higher on a patient-reported outcome measure in the last 1-2 visits than when taken in the first 2 sessions   ? Time 3   ? Period Weeks   ? Status On-going   ? Target Date 11/09/21   ?  ? SLP LONG TERM GOAL #3  ? Title pt will verbally generate and independently carryover solutions to problems encountered in everyday living with min A in 4 sessions   ? Time 3   ? Period Weeks   ? Status On-going   ? Target Date 11/09/21   ?  ? SLP LONG TERM GOAL #4  ? Title pt will note verbal or written errors in work-like tasks independently for improving verbal and/or written accuracy   ? Time 3   ? Period Weeks   ? Status On-going   ? Target Date 11/09/21   ? ?  ?  ? ?  ? ? ? Plan - 10/20/21 1230   ? ? 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 Conducted education and instruction for implementation of energy conservation, memory/attention compensations, and functional cognitive linguistic tasks to target at home to maximize accuracy and performance of functional tasks at home. Her performance in some of these areas have flucutated as she progresses through chemo/rad tx due to temporary physiological changes to the brain. She will benefit from skilled ST targeting these deficits.   ? Speech Therapy Frequency 2x / week   ? Duration 12 weeks   pt may schedule x1/week for 6 weeks due to chemo/rad schedule  ? Treatment/Interventions Cognitive reorganization;Environmental controls;Internal/external aids;Compensatory strategies;Patient/family education;Functional tasks;Cueing hierarchy   ? Potential to Achieve Goals Good   ? Consulted and Agree with Plan of Care Patient   ? ?  ?  ? ?  ? ? ?Patient will benefit from skilled therapeutic intervention in order to improve the following  deficits and impairments:   ?Cognitive communication deficit ? ? ? ?Problem List ?Patient Active Problem List  ? Diagnosis Date Noted  ? Generalized anxiety disorder 10/18/2021  ? Goals of care, counseling/discussion 07/20/2021  ? 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[facility]) 07/12/2021  ? Brain mass 06/30/2021  ? Hypothyroidism 06/30/2021  ? Hypokalemia 06/30/2021  ? Essential hypertension 05/25/2021  ? Hyperglycemia 07/14/2011  ? Normocytic anemia 07/13/2011  ? ? ?Marzetta Board, CCC-SLP ?10/20/2021, 1:18 PM ? ?[facility] ?  [address] ?[place], [place], [address] ?Phone: [phone]   Fax:  [phone] ? ? ?Name: Denise Wright ?MRN: [id number] ?Date of Birth: June 07, 1951 ? ?

## 2021-10-23 ENCOUNTER — Other Ambulatory Visit (HOSPITAL_COMMUNITY): Payer: Self-pay

## 2021-10-23 ENCOUNTER — Other Ambulatory Visit: Payer: Self-pay

## 2021-10-23 ENCOUNTER — Other Ambulatory Visit: Payer: Self-pay | Admitting: *Deleted

## 2021-10-23 ENCOUNTER — Telehealth: Payer: Self-pay | Admitting: Internal Medicine

## 2021-10-23 ENCOUNTER — Telehealth: Payer: Self-pay | Admitting: Pharmacist

## 2021-10-23 ENCOUNTER — Inpatient Hospital Stay (HOSPITAL_BASED_OUTPATIENT_CLINIC_OR_DEPARTMENT_OTHER): Payer: BC Managed Care – PPO | Admitting: Internal Medicine

## 2021-10-23 VITALS — BP 130/72 | HR 72 | Temp 97.7°F | Resp 18 | Ht 62.5 in | Wt 230.6 lb

## 2021-10-23 DIAGNOSIS — C719 Malignant neoplasm of brain, unspecified: Secondary | ICD-10-CM

## 2021-10-23 DIAGNOSIS — C712 Malignant neoplasm of temporal lobe: Secondary | ICD-10-CM | POA: Diagnosis not present

## 2021-10-23 MED ORDER — TEMOZOLOMIDE 180 MG PO CAPS
180.0000 mg | ORAL_CAPSULE | Freq: Every day | ORAL | 0 refills | Status: DC
  Filled 2021-10-23: qty 5, 5d supply, fill #0

## 2021-10-23 MED ORDER — DEXAMETHASONE 1 MG PO TABS: 1.0000 mg | ORAL_TABLET | Freq: Every day | ORAL | 1 refills | Status: DC

## 2021-10-23 MED ORDER — ONDANSETRON HCL 8 MG PO TABS
8.0000 mg | ORAL_TABLET | Freq: Two times a day (BID) | ORAL | 1 refills | Status: DC | PRN
  Filled 2021-10-23: qty 30, 15d supply, fill #0

## 2021-10-23 MED ORDER — TEMOZOLOMIDE 140 MG PO CAPS
140.0000 mg | ORAL_CAPSULE | Freq: Every day | ORAL | 0 refills | Status: DC
  Filled 2021-10-23: qty 5, 5d supply, fill #0

## 2021-10-23 NOTE — Progress Notes (Signed)
? ?[facility] [address]  ?[place], [place] [address] ?[phone] ? ? ?Interval Evaluation ? ?Date of Service: 10/23/21 ?Patient Name: Denise Wright ?Patient MRN: [id number] ?Patient DOB: [birth year] ?Provider: Ventura Sellers, MD ? ?Identifying Statement:  ?Denise Wright is a 71 y.o. female with right temporal glioblastoma  ? ?Oncologic History: ?Oncology History  ?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[facility])  ?07/06/2021 Surgery  ? Right temporal craniotomy, resection with Dr. Reatha Armour; path is glioblastoma IDH-1wt ?  ?08/07/2021 - 09/15/2021 Radiation Therapy  ? IMRT and concurrent Temodar 49m/m2 ?  ? ? ?Biomarkers: ? ?MGMT Unknown.  ?IDH 1/2 Wild type.  ?EGFR Unknown  ?TERT Unknown  ? ?Interval History: ?Denise Wright presents today for follow up.  Left arm and leg weakness are continuing to improve, she is now walking some with a walker.  Decadron is down to 279mdaily.  Leg edema persists as prior, and she describes some calf discomfort when leg (either) is squeezed.  Today she also describes some shortness of breath with activity, exertion. This has been present over the past several weeks or more.  PO intake and hydration are stable.  Fatigue continues to be very persistent and problematic.  Denies seizures or headaches today. ? ?H+P (07/20/21) Patient presented to medical attention last month with several days, weeks of progressive mental status changes.  Family noticed she was confused and disoriented over normal conversation and activities of daily living.  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  CNS imaging demonstrated an enhancing mass in the right temporal lobe; she underwent craniotomy, resection with Dr. DaReatha Armourn 07/06/21, which demonstrated glioblastoma.  Following surgery she experienced worsening left sided weakness, which prompted rehabilitation admission.  At present, she  is doing some walking on her own, albeit slowly.  Left arm and hand have improved, she is starting occupational therapy this week. ? ?Medications: ?Current Outpatient Medications on File Prior to Visit  ?Medication Sig Dispense Refill  ? acetaminophen (TYLENOL) 325 MG tablet Take 2 tablets (650 mg total) by mouth every 6 (six) hours as needed for mild pain (or Fever >/= 101).    ? ALPRAZolam (XANAX) 0.25 MG tablet Take 1 tablet (0.25 mg total) by mouth 2 (two) times daily as needed for anxiety. 15 tablet 0  ? b complex vitamins capsule Take 1 capsule by mouth daily.    ? cetirizine (ZYRTEC) 10 MG tablet Take 10 mg by mouth daily.    ? cholecalciferol (VITAMIN D) 1000 UNITS tablet Take 2,000 Units by mouth daily.    ? dexamethasone (DECADRON) 1 MG tablet Take 3 tablets (3 mg total) by mouth daily for 7 days, THEN 2 tablets (2 mg total) daily for 7 days. 35 tablet 0  ? docusate sodium (COLACE) 100 MG capsule Take 1 capsule (100 mg total) by mouth 2 (two) times daily. 10 capsule 0  ? famotidine (PEPCID) 20 MG tablet Take 1 tablet (20 mg total) by mouth daily. 30 tablet 2  ? fluticasone (FLONASE) 50 MCG/ACT nasal spray Place 1 spray into both nostrils daily as needed for allergies. OTC    ? furosemide (LASIX) 40 MG tablet furosemide 40 mg tablet    ? levothyroxine (SYNTHROID) 50 MCG tablet Take 1 tablet (50 mcg total) by mouth daily. 30 tablet 0  ? Multiple Vitamins-Minerals (MULTIVITAMIN WITH MINERALS) tablet Take 1 tablet by mouth daily.    ? NON FORMULARY  Apply topically as needed. Magne Sport Balm ?Apply to skin as needed for joint/muscle pain    ? ondansetron (ZOFRAN) 8 MG tablet Take 1 tablet (8 mg total) by mouth 2 (two) times daily as needed (nausea and vomiting). May take 30-60 minutes prior to Temodar administration if nausea/vomiting occurs. 30 tablet 1  ? ?No current facility-administered medications on file prior to visit.  ? ? ?Allergies:  ?Allergies  ?Allergen Reactions  ? Fexofenadine Hives  ?  Penicillins Itching and Other (See Comments)  ?  Pulling sensation in body  ? ?Past Medical History:  ?Past Medical History:  ?Diagnosis Date  ? Hyperlipidemia   ? Hypertension   ? Pneumonia   ? Thyroid disease   ? ?Past Surgical History:  ?Past Surgical History:  ?Procedure Laterality Date  ? ABDOMINAL HYSTERECTOMY  1983  ? APPLICATION OF CRANIAL NAVIGATION Right 07/06/2021  ? Procedure: APPLICATION OF CRANIAL NAVIGATION;  Surgeon: Dawley, Theodoro Doing, DO;  Location: [facility];  Service: Neurosurgery;  Laterality: Right;  ? CRANIOTOMY Right 07/06/2021  ? Procedure: CRANIOTOMY TUMOR EXCISION;  Surgeon: Karsten Ro, DO;  Location: [facility];  Service: Neurosurgery;  Laterality: Right;  ? TONSILLECTOMY  1965  ? TOOTH EXTRACTION    ? ?Social History:  ?Social History  ? ?Socioeconomic History  ? Marital status: Divorced  ?  Spouse name: Not on file  ? Number of children: 3  ? Years of education: 36  ? Highest education level: Not on file  ?Occupational History  ? Occupation: Masters degree in public service  ?  Employer: [employer]  ? Occupation: Working on Engineer, maintenance  ? Occupation: Training and development officer of [employer]  ?Tobacco Use  ? Smoking status: Never  ? Smokeless tobacco: Never  ?Vaping Use  ? Vaping Use: Never used  ?Substance and Sexual Activity  ? Alcohol use: No  ? Drug use: No  ? Sexual activity: Yes  ?  Birth control/protection: Surgical  ?Other Topics Concern  ? Not on file  ?Social History Narrative  ? Divorced, lives in [place] alone.  Independent of ADLs.  ? ?Social Determinants of Health  ? ?Financial Resource Strain: Not on file  ?Food Insecurity: Not on file  ?Transportation Needs: Not on file  ?Physical Activity: Not on file  ?Stress: Not on file  ?Social Connections: Not on file  ?Intimate Partner Violence: Not on file  ? ?Family History:  ?Family History  ?Problem Relation Age of Onset  ? Diabetes Mother   ? Hypertension Mother   ? Hypertension Father   ? Leukemia  Father   ? Stroke Father   ? Cancer Brother   ? Pneumonia Brother   ? ? ?Review of Systems: ?Constitutional: Doesn't report fevers, chills or abnormal weight loss ?Eyes: Doesn't report blurriness of vision ?Ears, nose, mouth, throat, and face: Doesn't report sore throat ?Respiratory: Doesn't report cough, dyspnea or wheezes ?Cardiovascular: Doesn't report palpitation, chest discomfort  ?Gastrointestinal:  Doesn't report nausea, constipation, diarrhea ?GU: Doesn't report incontinence ?Skin: Doesn't report skin rashes ?Neurological: Per HPI ?Musculoskeletal: Doesn't report joint pain ?Behavioral/Psych: Doesn't report anxiety ? ?Physical Exam: ?Vitals:  ? 10/23/21 1132  ?BP: 130/72  ?Pulse: 72  ?Resp: 18  ?Temp: 97.7 ?F (36.5 ?C)  ?SpO2: 100%  ? ? ?KPS: 60 ?ECOG 1 ?General: Alert, cooperative, pleasant, in no acute distress ?Head: Normal ?EENT: No conjunctival injection or scleral icterus.  ?Lungs: Resp effort normal ?Cardiac: Regular rate ?Abdomen: Non-distended abdomen ?Skin: Rash on  truck, arms ?Extremities: 2+ dependent edema R>L ? ?Neurologic Exam: ?Mental Status: Awake, alert, attentive to examiner. Oriented to self and environment. Language is fluent with intact comprehension.  ?Cranial Nerves: Visual acuity is grossly normal. Visual fields are full. Extra-ocular movements intact. No ptosis. Face is symmetric ?Motor: Tone and bulk are normal. Power is 4/5 in left arm and leg, asterixis noted with posturing. Reflexes are symmetric, no pathologic reflexes present.  ?Sensory: Intact to light touch ?Gait: Hemiparetic ? ?Labs: ?I have reviewed the data as listed ?   ?Component Value Date/Time  ? NA 142 10/09/2021 1200  ? K 3.5 10/09/2021 1200  ? CL 106 10/09/2021 1200  ? CO2 30 10/09/2021 1200  ? GLUCOSE 140 (H) 10/09/2021 1200  ? BUN 27 (H) 10/09/2021 1200  ? CREATININE 1.21 (H) 10/09/2021 1200  ? CALCIUM 8.8 (L) 10/09/2021 1200  ? PROT 6.2 (L) 10/09/2021 1200  ? ALBUMIN 3.6 10/09/2021 1200  ? AST 13 (L) 10/09/2021  1200  ? ALT 18 10/09/2021 1200  ? ALKPHOS 65 10/09/2021 1200  ? BILITOT 0.4 10/09/2021 1200  ? GFRNONAA 49 (L) 10/09/2021 1200  ? GFRAA 70 (L) 07/17/2011 0311  ? ?Lab Results  ?Component Value Date  ? WBC 6.8

## 2021-10-23 NOTE — Telephone Encounter (Addendum)
Oral Oncology Pharmacist Encounter  Received new prescription for Temodar (temozolomide) for the treatment of glioblastoma, planned duration ~6-12 cycles.  CBC w/ Diff and CMP from 10/09/21 assessed, no renal dose adjustments required for temozolomide. Prescription dose and frequency assessed for appropriateness. Initiation pending results of leg ultrasound to rule out DVT. Since patient positive for DVT, Dr. Mickeal Skinner planning to delay start of temozolomide initiation until he has f/u with patient in 1-2 weeks.   Current medication list in Epic reviewed, no relevant/significant DDIs with Temodar identified.  Evaluated chart and no patient barriers to medication adherence noted.   Patient agreement for treatment documented in MD note on 10/23/21.  Prescription has been e-scribed to the [facility] for benefits analysis and approval.  Oral Oncology Clinic will continue to follow for insurance authorization, copayment issues, initial counseling and start date.  ADDENDUM 11/14/2021 Confirmed with Dr. Mickeal Skinner that patient is not proceeding with temozolomide. Medication discontinued off of patient's medication list.   Leron Croak, PharmD, BCPS Hematology/Oncology Clinical Pharmacist [facility] [phone] 10/23/2021 3:20 PM

## 2021-10-23 NOTE — Progress Notes (Signed)
DISCONTINUE ON PATHWAY REGIMEN - Neuro ? ? ?  One cycle, concurrent with RT: ?    Temozolomide  ? ?**Always confirm dose/schedule in your pharmacy ordering system** ? ?REASON: Continuation Of Treatment ?PRIOR TREATMENT: [id number]: Radiation Therapy with Concurrent Temozolomide 75 mg/m2 Daily x 6 Weeks, Followed by Adjuvant Temozolomide ?TREATMENT RESPONSE: Unable to Evaluate ? ?START ON PATHWAY REGIMEN - Neuro ? ? ?  A cycle is every 28 days: ?    Temozolomide  ?    Temozolomide  ? ?**Always confirm dose/schedule in your pharmacy ordering system** ? ?Patient Characteristics: ?Glioma, Glioblastoma, IDH-wildtype, Newly Diagnosed / Treatment Naive, Good Performance Status and/or Younger Patient, MGMT Promoter Unmethylated/Unknown ?Disease Classification: Glioma ?Disease Classification: Glioblastoma, IDH-wildtype ?Disease Status: Newly Diagnosed / Treatment Naive ?Performance Status: Good Performance Status and/or Younger Patient ?MGMT Promoter Methylation Status: Awaiting Test Results ?Intent of Therapy: ?Non-Curative / Palliative Intent, Discussed with Patient ?

## 2021-10-23 NOTE — Addendum Note (Signed)
Addended by: Alger Simons T on: 10/23/2021 12:35 PM ? ? Modules accepted: Orders ? ?

## 2021-10-23 NOTE — Telephone Encounter (Signed)
Per 5/15 los called and spoke to pt daughter about appointments.  Pt confirmed appointments  ?

## 2021-10-24 ENCOUNTER — Ambulatory Visit (HOSPITAL_COMMUNITY): Payer: BC Managed Care – PPO

## 2021-10-25 ENCOUNTER — Emergency Department (HOSPITAL_BASED_OUTPATIENT_CLINIC_OR_DEPARTMENT_OTHER): Payer: BC Managed Care – PPO

## 2021-10-25 ENCOUNTER — Other Ambulatory Visit: Payer: Self-pay | Admitting: Internal Medicine

## 2021-10-25 ENCOUNTER — Ambulatory Visit: Payer: BC Managed Care – PPO

## 2021-10-25 ENCOUNTER — Other Ambulatory Visit: Payer: Self-pay

## 2021-10-25 ENCOUNTER — Ambulatory Visit (HOSPITAL_BASED_OUTPATIENT_CLINIC_OR_DEPARTMENT_OTHER)
Admission: RE | Admit: 2021-10-25 | Discharge: 2021-10-25 | Disposition: A | Discharge status: Home / Self Care | Payer: BC Managed Care – PPO | Source: Ambulatory Visit | Attending: Internal Medicine | Admitting: Internal Medicine

## 2021-10-25 ENCOUNTER — Encounter (HOSPITAL_BASED_OUTPATIENT_CLINIC_OR_DEPARTMENT_OTHER): Payer: Self-pay | Admitting: Emergency Medicine

## 2021-10-25 ENCOUNTER — Other Ambulatory Visit (HOSPITAL_COMMUNITY): Payer: Self-pay

## 2021-10-25 ENCOUNTER — Ambulatory Visit: Payer: BC Managed Care – PPO | Admitting: Physical Therapy

## 2021-10-25 ENCOUNTER — Telehealth: Payer: Self-pay

## 2021-10-25 ENCOUNTER — Emergency Department (HOSPITAL_BASED_OUTPATIENT_CLINIC_OR_DEPARTMENT_OTHER)
Admission: EM | Admit: 2021-10-25 | Discharge: 2021-10-26 | Disposition: A | Discharge status: Home / Self Care | Payer: BC Managed Care – PPO | Attending: Emergency Medicine | Admitting: Emergency Medicine

## 2021-10-25 DIAGNOSIS — R0602 Shortness of breath: Secondary | ICD-10-CM | POA: Diagnosis present

## 2021-10-25 DIAGNOSIS — I2694 Multiple subsegmental pulmonary emboli without acute cor pulmonale: Secondary | ICD-10-CM | POA: Insufficient documentation

## 2021-10-25 DIAGNOSIS — I10 Essential (primary) hypertension: Secondary | ICD-10-CM | POA: Diagnosis not present

## 2021-10-25 DIAGNOSIS — C719 Malignant neoplasm of brain, unspecified: Secondary | ICD-10-CM | POA: Insufficient documentation

## 2021-10-25 DIAGNOSIS — Z79899 Other long term (current) drug therapy: Secondary | ICD-10-CM | POA: Insufficient documentation

## 2021-10-25 HISTORY — DX: Malignant neoplasm of brain, unspecified: C71.9

## 2021-10-25 LAB — CBC WITH DIFFERENTIAL/PLATELET
Abs Immature Granulocytes: 0.02 10*3/uL (ref 0.00–0.07)
Basophils Absolute: 0 10*3/uL (ref 0.0–0.1)
Basophils Relative: 0 %
Eosinophils Absolute: 0.3 10*3/uL (ref 0.0–0.5)
Eosinophils Relative: 5 %
HCT: 31.5 % — ABNORMAL LOW (ref 36.0–46.0)
Hemoglobin: 9.9 g/dL — ABNORMAL LOW (ref 12.0–15.0)
Immature Granulocytes: 0 %
Lymphocytes Relative: 8 %
Lymphs Abs: 0.5 10*3/uL — ABNORMAL LOW (ref 0.7–4.0)
MCH: 30.4 pg (ref 26.0–34.0)
MCHC: 31.4 g/dL (ref 30.0–36.0)
MCV: 96.6 fL (ref 80.0–100.0)
Monocytes Absolute: 0.6 10*3/uL (ref 0.1–1.0)
Monocytes Relative: 10 %
Neutro Abs: 4.4 10*3/uL (ref 1.7–7.7)
Neutrophils Relative %: 77 %
Platelets: 177 10*3/uL (ref 150–400)
RBC: 3.26 MIL/uL — ABNORMAL LOW (ref 3.87–5.11)
RDW: 14.6 % (ref 11.5–15.5)
WBC: 5.7 10*3/uL (ref 4.0–10.5)
nRBC: 0 % (ref 0.0–0.2)

## 2021-10-25 LAB — BASIC METABOLIC PANEL
Anion gap: 9 (ref 5–15)
BUN: 27 mg/dL — ABNORMAL HIGH (ref 8–23)
CO2: 21 mmol/L — ABNORMAL LOW (ref 22–32)
Calcium: 8.2 mg/dL — ABNORMAL LOW (ref 8.9–10.3)
Chloride: 108 mmol/L (ref 98–111)
Creatinine, Ser: 0.99 mg/dL (ref 0.44–1.00)
GFR, Estimated: >60 mL/min (ref >60)
Glucose, Bld: 94 mg/dL (ref 70–99)
Potassium: 4.2 mmol/L (ref 3.5–5.1)
Sodium: 138 mmol/L (ref 135–145)

## 2021-10-25 MED ORDER — IOHEXOL 350 MG/ML SOLN
100.0000 mL | Freq: Once | INTRAVENOUS | Status: AC | PRN
  Administered 2021-10-25: 75 mL via INTRAVENOUS

## 2021-10-25 MED ORDER — APIXABAN (ELIQUIS) VTE STARTER PACK (10MG AND 5MG): ORAL_TABLET | ORAL | 0 refills | Status: DC

## 2021-10-25 MED ORDER — APIXABAN 5 MG PO TABS: 5.0000 mg | ORAL_TABLET | Freq: Two times a day (BID) | ORAL | 3 refills | Status: DC

## 2021-10-25 NOTE — ED Notes (Signed)
Pt. States that she had an ultrasound performed today that confirmed that she has blood clots in both legs.  She has been experiencing SOB for the last 2 days. Eliquis was prescribed, but the pharmacy did not have it, per daughter.  ?

## 2021-10-25 NOTE — ED Triage Notes (Signed)
Pt c/o SHOB x a few days. Pt had [place] of BLE today that showed blood clots in both legs. Eliquis was sent in but unavailable for pick up.  ?

## 2021-10-25 NOTE — Progress Notes (Signed)
BLE venous duplex has been completed.  Preliminary findings given to Dr. Mickeal Skinner. ? ? ?Results can be found under chart review under CV PROC. ?10/25/2021 11:15 AM ?Dynasti Kerman RVT, RDMS ? ?

## 2021-10-25 NOTE — Telephone Encounter (Signed)
Called pt, no answer. Her daughter [name] Scriven answered and will tell the pt per Dr. Mickeal Skinner, she is positive for a DVT and has ordered eliquis '5mg'$  BID to her pharmacy to start this morning. She verbalized understanding and is going to pick up the prescription for her mother to start taking. No further questions or concerns.  ?

## 2021-10-25 NOTE — ED Provider Notes (Signed)
[facility] Provider Note: Denise Spurling, MD, FACEP  CSN: [id number] MRN: [id number] ARRIVAL: 10/25/21 at 2202 ROOM: [place]  10/25/21 11:25 PM Denise Wright is a 71 y.o. female currently being treated for glioblastoma.  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  Although the patient herself is not aware of being short of breath, several family members have observed that she appears to be breathing abnormally for about the past week.  She was prescribed Eliquis today for her DVTs but the pharmacy was unable to fill the prescription.  Her family became concerned about the possibility of a pulmonary embolism given the known DVTs.  She denies any chest pain.   Past Medical History:  Diagnosis Date   Glioblastoma ([place])    Hyperlipidemia    Hypertension    Pneumonia    Thyroid disease     Past Surgical History:  Procedure Laterality Date   ABDOMINAL HYSTERECTOMY  1607   APPLICATION OF CRANIAL NAVIGATION Right 07/06/2021   Procedure: APPLICATION OF CRANIAL NAVIGATION;  Surgeon: Dawley, Theodoro Doing, DO;  Location: [place];  Service: Neurosurgery;  Laterality: Right;   CRANIOTOMY Right 07/06/2021   Procedure: CRANIOTOMY TUMOR EXCISION;  Surgeon: Karsten Ro, DO;  Location: [facility];  Service: Neurosurgery;  Laterality: Right;   TONSILLECTOMY  1965   TOOTH EXTRACTION      Family History  Problem Relation Age of Onset   Diabetes Mother    Hypertension Mother    Hypertension Father    Leukemia Father    Stroke Father    Cancer Brother    Pneumonia Brother     Social History   Tobacco Use   Smoking status: Never   Smokeless tobacco: Never  Vaping Use   Vaping Use: Never used  Substance Use Topics   Alcohol use: No   Drug use: No    Prior to Admission medications    Medication Sig Start Date End Date Taking? Authorizing Provider  acetaminophen (TYLENOL) 325 MG tablet Take 2 tablets (650 mg total) by mouth every 6 (six) hours as needed for mild pain (or Fever >/= 101). 07/21/21   Angiulli, Lavon Paganini, PA-C  ALPRAZolam Duanne Moron) 0.25 MG tablet Take 1 tablet (0.25 mg total) by mouth 2 (two) times daily as needed for anxiety. 10/18/21   Meredith Staggers, MD  APIXABAN Arne Cleveland) VTE STARTER PACK ('10MG'$  AND '5MG'$ ) Take as directed on package: start with two-'5mg'$  tablets twice daily for 7 days. On day 8, switch to one-'5mg'$  tablet twice daily. 10/25/21   Ventura Sellers, MD  b complex vitamins capsule Take 1 capsule by mouth daily.    [provider]  cetirizine (ZYRTEC) 10 MG tablet Take 10 mg by mouth daily.    [provider]  cholecalciferol (VITAMIN D) 1000 UNITS tablet Take 2,000 Units by mouth daily.    [provider]  dexamethasone (DECADRON) 1 MG tablet Take 1 tablet (1 mg total) by mouth daily. 10/23/21   Vaslow, Acey Lav, MD  docusate sodium (COLACE) 100 MG capsule Take 1 capsule (100 mg total) by mouth 2 (two) times daily. 07/21/21   Angiulli, Lavon Paganini, PA-C  famotidine (PEPCID) 20 MG tablet Take 1 tablet (20 mg total) by mouth daily. 08/31/21   Ventura Sellers, MD  fluticasone (FLONASE) 50  MCG/ACT nasal spray Place 1 spray into both nostrils daily as needed for allergies. OTC    [provider]  furosemide (LASIX) 40 MG tablet furosemide 40 mg tablet    [provider]  levothyroxine (SYNTHROID) 50 MCG tablet Take 1 tablet (50 mcg total) by mouth daily. 07/21/21   Angiulli, Lavon Paganini, PA-C  Multiple Vitamins-Minerals (MULTIVITAMIN WITH MINERALS) tablet Take 1 tablet by mouth daily.    [provider]  NON FORMULARY Apply topically as needed. Magne Sport Balm Apply to skin as needed for joint/muscle pain 08/09/21   [provider]  ondansetron (ZOFRAN) 8 MG tablet Take 1 tablet by mouth 2 times daily as  needed (nausea and vomiting). May take 30-60 minutes prior to Temodar administration if nausea/vomiting occurs. 10/23/21   Ventura Sellers, MD  temozolomide (TEMODAR) 140 MG capsule Take 1 capsule (140 mg total) by mouth daily. May take on an empty stomach to decrease nausea & vomiting. 10/23/21   Ventura Sellers, MD  temozolomide (TEMODAR) 180 MG capsule Take 1 capsule (180 mg total) by mouth daily. May take on an empty stomach to decrease nausea & vomiting. 10/23/21   Ventura Sellers, MD    Allergies Fexofenadine and Penicillins   REVIEW OF SYSTEMS  Negative except as noted here or in the History of Present Illness.   PHYSICAL EXAMINATION  Initial Vital Signs Blood pressure (!) 142/79, pulse 88, temperature 98.5 F (36.9 C), resp. rate 16, height 5' 2.5" (1.588 m), weight 104.6 kg, SpO2 100 %.  Examination General: Well-developed, well-nourished female in no acute distress; appearance consistent with age of record HENT: normocephalic; atraumatic Eyes: pupils equal, round and reactive to light; extraocular muscles intact; faint arcus senilis bilaterally Neck: supple Heart: regular rate and rhythm Lungs: clear to auscultation bilaterally Abdomen: soft; nondistended; nontender; bowel sounds present Extremities: No deformity; full range of motion; pulses normal; 3+ pitting edema of lower extremities Neurologic: Awake, alert and oriented; motor function intact in all extremities and symmetric; no facial droop Skin: Warm and dry Psychiatric: Normal mood and affect   RESULTS  Summary of this visit's results, reviewed and interpreted by myself:   EKG Interpretation  Date/Time:    Ventricular Rate:    PR Interval:    QRS Duration:   QT Interval:    QTC Calculation:   R Axis:     Text Interpretation:         Laboratory Studies: Results for orders placed or performed during the hospital encounter of 10/25/21 (from the past 24 hour(s))  CBC with Differential     Status:  Abnormal   Collection Time: 10/25/21 10:58 PM  Result Value Ref Range   WBC 5.7 4.0 - 10.5 K/uL   RBC 3.26 (L) 3.87 - 5.11 MIL/uL   Hemoglobin 9.9 (L) 12.0 - 15.0 g/dL   HCT 31.5 (L) 36.0 - 46.0 %   MCV 96.6 80.0 - 100.0 fL   MCH 30.4 26.0 - 34.0 pg   MCHC 31.4 30.0 - 36.0 g/dL   RDW 14.6 11.5 - 15.5 %   Platelets 177 150 - 400 K/uL   nRBC 0.0 0.0 - 0.2 %   Neutrophils Relative % 77 %   Neutro Abs 4.4 1.7 - 7.7 K/uL   Lymphocytes Relative 8 %   Lymphs Abs 0.5 (L) 0.7 - 4.0 K/uL   Monocytes Relative 10 %   Monocytes Absolute 0.6 0.1 - 1.0 K/uL   Eosinophils Relative 5 %   Eosinophils  Absolute 0.3 0.0 - 0.5 K/uL   Basophils Relative 0 %   Basophils Absolute 0.0 0.0 - 0.1 K/uL   Immature Granulocytes 0 %   Abs Immature Granulocytes 0.02 0.00 - 0.07 K/uL  Basic metabolic panel     Status: Abnormal   Collection Time: 10/25/21 10:58 PM  Result Value Ref Range   Sodium 138 135 - 145 mmol/L   Potassium 4.2 3.5 - 5.1 mmol/L   Chloride 108 98 - 111 mmol/L   CO2 21 (L) 22 - 32 mmol/L   Glucose, Bld 94 70 - 99 mg/dL   BUN 27 (H) 8 - 23 mg/dL   Creatinine, Ser 0.99 0.44 - 1.00 mg/dL   Calcium 8.2 (L) 8.9 - 10.3 mg/dL   GFR, Estimated >60 >60 mL/min   Anion gap 9 5 - 15   Imaging Studies: CT Angio Chest PE W and/or Wo Contrast  Result Date: 10/26/2021 CLINICAL DATA:  Shortness of breath, known DVT EXAM: CT ANGIOGRAPHY CHEST WITH CONTRAST TECHNIQUE: Multidetector CT imaging of the chest was performed using the standard protocol during bolus administration of intravenous contrast. Multiplanar CT image reconstructions and MIPs were obtained to evaluate the vascular anatomy. RADIATION DOSE REDUCTION: This exam was performed according to the departmental dose-optimization program which includes automated exposure control, adjustment of the mA and/or kV according to patient size and/or use of iterative reconstruction technique. CONTRAST:  32m OMNIPAQUE IOHEXOL 350 MG/ML SOLN COMPARISON:  CT  chest dated 06/30/2021 FINDINGS: Cardiovascular: Satisfactory opacification of the bilateral pulmonary arteries to the lobar level. Lobar pulmonary emboli in the right upper and lower lobes (series 4/images 45 and 68). Segmental/subsegmental pulmonary emboli in the left lower lobe (series 4/images 64 and 81). Overall clot burden is small. No evidence of right heart strain. Mild cardiomegaly. No pericardial effusion. No evidence thoracic aortic aneurysm. Atherosclerotic calcifications of the descending thoracic aorta. Mediastinum/Nodes: No suspicious mediastinal lymphadenopathy. Visualized thyroid is unremarkable. Lungs/Pleura: Evaluation of the lung parenchyma is constrained by respiratory motion. Within that constraint, there are no suspicious pulmonary nodules. Volume loss in the left hemithorax. No focal consolidation. No pleural effusion or pneumothorax. Upper Abdomen: Visualized upper abdomen is notable for scattered hepatic cysts. Musculoskeletal: Visualized osseous structures are within normal limits. Review of the MIP images confirms the above findings. IMPRESSION: Lobar and segmental pulmonary emboli, as above. Overall clot burden is small. No evidence of right heart strain. These results were called by telephone at the time of interpretation on 10/26/2021 at 12:31 am [facility], who verbally acknowledged these results. Aortic Atherosclerosis (ICD10-I70.0). Electronically Signed   By: SJulian HyM.D.   On: 10/26/2021 00:35   VAS [place]OWER EXTREMITY VENOUS (DVT)  Result Date: 10/25/2021  Lower Venous DVT Study Patient Name:  KBRANTLEIGH MIFFLIN Date of Exam:   10/25/2021 Medical Rec #: [id number]       Accession #:    [id number] of Birth: [birth year]       Patient Gender: F Patient Age:   89years [facility]      VAS [place]OWER EXTREMITY VENOUS (DVT) Referring Phys: ZAlroy DustVASLOW  --------------------------------------------------------------------------------  Indications: Edema, and Pain.  Risk Factors: Cancer. Limitations: Body habitus, poor ultrasound/tissue interface and patient pain intolerance. Comparison Study: No previous exams Performing Technologist: Jody Hill RVT, RDMS  Examination Guidelines: A complete evaluation includes B-mode imaging, spectral Doppler, color Doppler, and power Doppler as needed of all accessible portions of  each vessel. Bilateral testing is considered an integral part of a complete examination. Limited examinations for reoccurring indications may be performed as noted. The reflux portion of the exam is performed with the patient in reverse Trendelenburg.  +--------+---------------+---------+-----------+----------+--------------------+ RIGHT   CompressibilityPhasicitySpontaneityPropertiesThrombus Aging       +--------+---------------+---------+-----------+----------+--------------------+ CFV     None           Yes      Yes                  Age Indeterminate    +--------+---------------+---------+-----------+----------+--------------------+ SFJ     None                                         Age Indeterminate    +--------+---------------+---------+-----------+----------+--------------------+ FV Prox None           No       No                   Age Indeterminate    +--------+---------------+---------+-----------+----------+--------------------+ FV Mid                                               Not visualized       +--------+---------------+---------+-----------+----------+--------------------+ FV                                                   Not visualized       Distal                                                                    +--------+---------------+---------+-----------+----------+--------------------+ PFV                    Yes      Yes                  patent by                                                                  color/doppler        +--------+---------------+---------+-----------+----------+--------------------+ POP     Partial        Yes      Yes                  Age Indeterminate    +--------+---------------+---------+-----------+----------+--------------------+ PTV                                                  Not visualized       +--------+---------------+---------+-----------+----------+--------------------+  PERO                                                 Not visualized       +--------+---------------+---------+-----------+----------+--------------------+ EIV                    Yes      Yes                  patent by                                                                 color/doppler        +--------+---------------+---------+-----------+----------+--------------------+ External iliac vein not well visualized, it is patent by color and doppler.  Right Technical Findings: Not visualized segments include Mid/distal femoral, peroneal, and posterior tibial veins.  +---------+---------------+---------+-----------+----------+------------------+ LEFT     CompressibilityPhasicitySpontaneityPropertiesThrombus Aging     +---------+---------------+---------+-----------+----------+------------------+ CFV      Full           Yes      Yes                                     +---------+---------------+---------+-----------+----------+------------------+ SFJ      Full                                                            +---------+---------------+---------+-----------+----------+------------------+ FV Prox  Full           Yes      Yes                                     +---------+---------------+---------+-----------+----------+------------------+ FV Mid   Full           Yes      Yes                                      +---------+---------------+---------+-----------+----------+------------------+ FV DistalNone           No       No                   Age Indeterminate  +---------+---------------+---------+-----------+----------+------------------+ PFV      Full                                                            +---------+---------------+---------+-----------+----------+------------------+ POP      None           No       No  Age Indeterminate  +---------+---------------+---------+-----------+----------+------------------+ PTV                     No       No                   one of paired PTVs +---------+---------------+---------+-----------+----------+------------------+ PERO     None           No       No                   Age Indeterminate  +---------+---------------+---------+-----------+----------+------------------+ Posterior tibials veins visualized by color/doppler only - unable to visualize for compression.    Summary: BILATERAL: -No evidence of popliteal cyst, bilaterally. RIGHT: - Findings consistent with age indeterminate deep vein thrombosis involving the right common femoral vein, SF junction, right femoral vein, and right popliteal vein. - Portions of this examination were limited- see technologist comments above.  LEFT: - Findings consistent with age indeterminate deep vein thrombosis involving the left femoral vein, left popliteal vein, left posterior tibial veins, and left peroneal veins. - Portions of this examination were limited- see technologist comments above.  *See table(s) above for measurements and observations. Electronically signed by Jamelle Haring on 10/25/2021 at 9:16:06 PM.    Final     ED COURSE and MDM  Nursing notes, initial and subsequent vitals signs, including pulse oximetry, reviewed and interpreted by myself.  Vitals:   10/25/21 2212 10/25/21 2217 10/25/21 2315  BP:  (!) 142/79 119/73  Pulse:  88 86  Resp:  16 (!) 21  Temp:   98.5 F (36.9 C)   SpO2:  100% 98%  Weight: 104.6 kg    Height: 5' 2.5" (1.588 m)     Medications  enoxaparin (LOVENOX) injection 105 mg (has no administration in time range)  iohexol (OMNIPAQUE) 350 MG/ML injection 100 mL (75 mLs Intravenous Contrast Given 10/25/21 2354)   12:38 AM The CT scan was reviewed by myself and several small pulmonary emboli were seen.  This was confirmed by the radiologist doing the formal read.  We will give the patient an initial dose of Lovenox and anticipate she will be able to get her Eliquis filled later today.  I will send a note to her oncologist about this as well.  She has not toxic or tachycardic nor does she appear in any respiratory distress on examination.  There is no evidence of heart strain on CT and I believe it is safe to send her home once we initiate her anticoagulation.   PROCEDURES  Procedures   ED DIAGNOSES     ICD-10-CM   1. Multiple subsegmental pulmonary emboli without acute cor pulmonale ([place])  I26.94          Odalys Win, MD 10/26/21 0041

## 2021-10-26 ENCOUNTER — Emergency Department (HOSPITAL_BASED_OUTPATIENT_CLINIC_OR_DEPARTMENT_OTHER): Payer: BC Managed Care – PPO

## 2021-10-26 MED ORDER — ENOXAPARIN SODIUM 100 MG/ML IJ SOSY
100.0000 mg | PREFILLED_SYRINGE | Freq: Once | INTRAMUSCULAR | Status: AC
Start: 2021-10-26 — End: 2021-10-26
  Administered 2021-10-26: 100 mg via SUBCUTANEOUS
  Filled 2021-10-26: qty 1

## 2021-10-26 NOTE — ED Notes (Signed)
Pt verbalizes understanding of discharge instructions. Opportunity for questioning and answers were provided. Pt discharged from ED to home with family.    

## 2021-10-27 ENCOUNTER — Telehealth: Payer: Self-pay | Admitting: Internal Medicine

## 2021-10-27 ENCOUNTER — Ambulatory Visit: Payer: BC Managed Care – PPO

## 2021-10-27 DIAGNOSIS — R278 Other lack of coordination: Secondary | ICD-10-CM | POA: Diagnosis not present

## 2021-10-27 DIAGNOSIS — R41841 Cognitive communication deficit: Secondary | ICD-10-CM

## 2021-10-27 NOTE — Patient Instructions (Signed)
For voice texting,  1) Select the person you want to text 2) Make sure you select the message section at the bottom  3) Hit the microphone button to START voice text 4) Say what you want to say 5) Hit the mircrophone button to STOP voice text 6) Double check your message before you send it

## 2021-10-27 NOTE — Therapy (Signed)
[facility] [address] [place], [place], [address] Phone: [phone]   Fax:  [phone]  Speech Language Pathology Treatment  Patient Details  Name: Denise Wright MRN: [id number] Date of Birth: February 01, 1951 Referring Provider (SLP): Lauraine Rinne, PA-C   Encounter Date: 10/27/2021   End of Session - 10/27/21 1017     Visit Number 14    Number of Visits 25    Date for SLP Re-Evaluation 11/09/21    Authorization Type BCBS    SLP Start Time 1017    SLP Stop Time  1100    SLP Time Calculation (min) 43 min    Activity Tolerance Patient tolerated treatment well             Past Medical History:  Diagnosis Date   Glioblastoma ([place])    Hyperlipidemia    Hypertension    Pneumonia    Thyroid disease     Past Surgical History:  Procedure Laterality Date   ABDOMINAL HYSTERECTOMY  3570   APPLICATION OF CRANIAL NAVIGATION Right 07/06/2021   Procedure: APPLICATION OF CRANIAL NAVIGATION;  Surgeon: Karsten Ro, DO;  Location: [facility];  Service: Neurosurgery;  Laterality: Right;   CRANIOTOMY Right 07/06/2021   Procedure: CRANIOTOMY TUMOR EXCISION;  Surgeon: Karsten Ro, DO;  Location: [facility];  Service: Neurosurgery;  Laterality: Right;   TONSILLECTOMY  1965   TOOTH EXTRACTION      There were no vitals filed for this visit.   Subjective Assessment - 10/27/21 1018     Subjective "I'm good"    Patient is accompained by: Family member    Currently in Pain? No/denies                   ADULT SLP TREATMENT - 10/27/21 1237       General Information   Behavior/Cognition Alert;Cooperative;Pleasant mood;Lethargic;Requires cueing;Confused      Treatment Provided   Treatment provided Cognitive-Linquistic      Cognitive-Linquistic Treatment   Treatment focused on Cognition;Patient/family/caregiver education    Skilled Treatment Discussed current challenges, in which daughter reports pt experiencing  difficulty with typing/texting with possible relation to vision. SLP assessed typing responses in phone. Rare emergent awareness exhibited for errors (intermittent captializations, missed letters, nonsense words) as pt unable to successfully alternate attention between screen and keyboard. Pt noted with difficulty correcting simple sentence and required trials x4 to complete with intermittent cues for error awareness. SLP suggested voicing texting to reduce fine/gross motor coordination burden, with usual min A required to sequence task to voice text. Of note, pt also exhibited possible left neglect when attempting to select from corrections (missed left sided item requiring SLP cues). SLP generated strategy for patient to scan from left side of phone case to right side of phone case to aid accuracy. Written handout provided to aid accuracy of sequencing and attention during texting tasks over weekend.      Assessment / Recommendations / Plan   Plan Continue with current plan of care      Progression Toward Goals   Progression toward goals Progressing toward goals              SLP Education - 10/27/21 1243     Education Details functional communication via typing/texting, limitations and modifications needed    Person(s) Educated Patient;Child(ren)    Methods Explanation;Demonstration;Verbal cues;Handout    Comprehension Verbalized understanding;Returned demonstration;Need further instruction  SLP Short Term Goals - 10/11/21 1323       SLP SHORT TERM GOAL #1   Title pt will complete standardized testing    Status Achieved    Target Date 08/25/21      SLP SHORT TERM GOAL #2   Title Pt will verbalize 3 attention/memory compensations for making/tracking appointments, managing meds, and other daily tasks in 2 sessions    Baseline 08-23-21    Status Partially Met    Target Date 09/08/21      SLP SHORT TERM GOAL #3   Title pt will verbally generate and carryover (with  min A) solutions to problems encountered in everyday living in 2 sessions    Baseline 08-23-21    Status Partially Met    Target Date 09/22/21      SLP SHORT TERM GOAL #4   Title pt will verbally state errors on mod complex written or verbal tasks with nonverbal cues    Baseline 08-23-21    Status Partially Met    Target Date 09/22/21              SLP Long Term Goals - 10/27/21 1017       SLP LONG TERM GOAL #1   Title Pt will verbalize AND IMPLEMEMENT 3 attention/memory compensations for making/tracking appointments, managing meds, and other daily tasks in 4 sessions    Baseline 10-06-21    Status Partially Met      SLP LONG TERM GOAL #2   Title Pt will score higher on a patient-reported outcome measure in the last 1-2 visits than when taken in the first 2 sessions    Time 2    Period Weeks    Status On-going      SLP LONG TERM GOAL #3   Title pt will verbally generate and independently carryover solutions to problems encountered in everyday living with min A in 4 sessions    Time 2    Period Weeks    Status On-going      SLP LONG TERM GOAL #4   Title pt will note verbal or written errors in work-like tasks independently for improving verbal and/or written accuracy    Time 2    Period Weeks    Status On-going              Plan - 10/27/21 [place] 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 Conducted education and instruction for implementation of energy conservation, memory/attention compensations, and functional cognitive linguistic tasks to target at home to maximize accuracy and performance of functional tasks at home. SLP targeted written communication as impacted by attention and awareness. Voicing texting was most successful to aid accuracy with cues required to aid recall and carryover.   Her performance in some of these areas have fluctuated as she progresses through chemo/rad tx due to temporary physiological changes to the brain. She will benefit from skilled ST targeting these deficits.    Speech Therapy Frequency 2x / week    Duration 12 weeks   pt may schedule x1/week for 6 weeks due to chemo/rad schedule   Treatment/Interventions Cognitive reorganization;Environmental controls;Internal/external aids;Compensatory strategies;Patient/family education;Functional tasks;Cueing hierarchy    Potential to Achieve Goals Good    Consulted and Agree with Plan of Care Patient             Patient will benefit from  skilled therapeutic intervention in order to improve the following deficits and impairments:   Cognitive communication deficit    Problem List Patient Active Problem List   Diagnosis Date Noted   Generalized anxiety disorder 10/18/2021   Goals of care, counseling/discussion 07/20/2021   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 [place], [place], [address] Phone: [phone]   Fax:  [phone]   Name: Denise Wright MRN: [id number] Date of Birth: [birth year]

## 2021-10-27 NOTE — Telephone Encounter (Signed)
.  Called patient to schedule appointment per 5/17 inbasket, patient is aware of date and time.   ?

## 2021-10-30 ENCOUNTER — Ambulatory Visit
Admission: RE | Admit: 2021-10-30 | Discharge: 2021-10-30 | Disposition: A | Discharge status: Home / Self Care | Payer: BC Managed Care – PPO | Source: Ambulatory Visit | Attending: Internal Medicine | Admitting: Internal Medicine

## 2021-10-30 DIAGNOSIS — C719 Malignant neoplasm of brain, unspecified: Secondary | ICD-10-CM

## 2021-10-30 NOTE — Progress Notes (Signed)
  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  She developed fatigue and anticipated skin changes in the treatment field.  She reports she's feeling pretty well but fatigue comes and goes from day to day. No other skin changes are of concern at this time.    Impression/Plan: 1.  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 The patient has been doing well since completion of radiotherapy. We discussed that we would be happy to continue to follow her as needed, but she will continue to follow up with Dr. Mickeal Skinner.      Carola Rhine, PAC

## 2021-11-01 ENCOUNTER — Ambulatory Visit: Payer: BC Managed Care – PPO

## 2021-11-02 ENCOUNTER — Inpatient Hospital Stay: Payer: BC Managed Care – PPO | Admitting: Internal Medicine

## 2021-11-02 ENCOUNTER — Other Ambulatory Visit: Payer: Self-pay

## 2021-11-02 ENCOUNTER — Inpatient Hospital Stay: Payer: BC Managed Care – PPO

## 2021-11-02 ENCOUNTER — Other Ambulatory Visit (HOSPITAL_COMMUNITY): Payer: Self-pay

## 2021-11-02 VITALS — BP 120/67 | HR 94 | Temp 97.7°F | Resp 18

## 2021-11-02 DIAGNOSIS — R3589 Other polyuria: Secondary | ICD-10-CM

## 2021-11-02 DIAGNOSIS — C719 Malignant neoplasm of brain, unspecified: Secondary | ICD-10-CM | POA: Diagnosis not present

## 2021-11-02 DIAGNOSIS — Z7189 Other specified counseling: Secondary | ICD-10-CM

## 2021-11-02 DIAGNOSIS — R350 Frequency of micturition: Secondary | ICD-10-CM | POA: Diagnosis not present

## 2021-11-02 DIAGNOSIS — C712 Malignant neoplasm of temporal lobe: Secondary | ICD-10-CM | POA: Diagnosis not present

## 2021-11-02 LAB — CMP (CANCER CENTER ONLY)
ALT: 12 U/L (ref 0–44)
AST: 11 U/L — ABNORMAL LOW (ref 15–41)
Albumin: 3.4 g/dL — ABNORMAL LOW (ref 3.5–5.0)
Alkaline Phosphatase: 65 U/L (ref 38–126)
Anion gap: 7 (ref 5–15)
BUN: 22 mg/dL (ref 8–23)
CO2: 27 mmol/L (ref 22–32)
Calcium: 8.5 mg/dL — ABNORMAL LOW (ref 8.9–10.3)
Chloride: 109 mmol/L (ref 98–111)
Creatinine: 1.32 mg/dL — ABNORMAL HIGH (ref 0.44–1.00)
GFR, Estimated: 43 mL/min — ABNORMAL LOW (ref >60)
Glucose, Bld: 101 mg/dL — ABNORMAL HIGH (ref 70–99)
Potassium: 3.8 mmol/L (ref 3.5–5.1)
Sodium: 143 mmol/L (ref 135–145)
Total Bilirubin: 0.4 mg/dL (ref 0.3–1.2)
Total Protein: 6.1 g/dL — ABNORMAL LOW (ref 6.5–8.1)

## 2021-11-02 LAB — CBC WITH DIFFERENTIAL (CANCER CENTER ONLY)
Abs Immature Granulocytes: 0.03 10*3/uL (ref 0.00–0.07)
Basophils Absolute: 0 10*3/uL (ref 0.0–0.1)
Basophils Relative: 1 %
Eosinophils Absolute: 0.3 10*3/uL (ref 0.0–0.5)
Eosinophils Relative: 5 %
HCT: 30.6 % — ABNORMAL LOW (ref 36.0–46.0)
Hemoglobin: 10 g/dL — ABNORMAL LOW (ref 12.0–15.0)
Immature Granulocytes: 1 %
Lymphocytes Relative: 8 %
Lymphs Abs: 0.4 10*3/uL — ABNORMAL LOW (ref 0.7–4.0)
MCH: 31.7 pg (ref 26.0–34.0)
MCHC: 32.7 g/dL (ref 30.0–36.0)
MCV: 97.1 fL (ref 80.0–100.0)
Monocytes Absolute: 0.5 10*3/uL (ref 0.1–1.0)
Monocytes Relative: 10 %
Neutro Abs: 4.1 10*3/uL (ref 1.7–7.7)
Neutrophils Relative %: 75 %
Platelet Count: 230 10*3/uL (ref 150–400)
RBC: 3.15 MIL/uL — ABNORMAL LOW (ref 3.87–5.11)
RDW: 14.4 % (ref 11.5–15.5)
WBC Count: 5.4 10*3/uL (ref 4.0–10.5)
nRBC: 0 % (ref 0.0–0.2)

## 2021-11-02 LAB — URINALYSIS, ROUTINE W REFLEX MICROSCOPIC
Bacteria, UA: NONE SEEN
Bilirubin Urine: NEGATIVE
Glucose, UA: NEGATIVE mg/dL
Hgb urine dipstick: NEGATIVE
Ketones, ur: NEGATIVE mg/dL
Nitrite: NEGATIVE
Protein, ur: NEGATIVE mg/dL
Specific Gravity, Urine: 1.019 (ref 1.005–1.030)
pH: 5 (ref 5.0–8.0)

## 2021-11-02 NOTE — Progress Notes (Signed)
[facility] at [place], [place] [address] [phone]   Interval Evaluation  Date of Service: 11/02/21 Patient Name: Denise Wright Patient MRN: [id number] Patient DOB: 08/09/1950 Provider: Ventura Sellers, MD  Identifying Statement:  Denise Wright is a 71 y.o. female with right temporal glioblastoma   Oncologic History: Oncology History  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[facility])  07/06/2021 Surgery   Right temporal craniotomy, resection with Dr. Reatha Armour; path is glioblastoma IDH-1wt   08/07/2021 - 09/15/2021 Radiation Therapy   IMRT and concurrent Temodar 6m/m2   10/29/2021 -  Chemotherapy   Patient is on Treatment Plan : BRAIN GLIOBLASTOMA Consolidation Temozolomide Days 1-5 q28 Days         Biomarkers:  MGMT Unknown.  IDH 1/2 Wild type.  EGFR Unknown  TERT Unknown   Interval History: [name] presents today for follow up after recent hospitalization for DVT/PE.  She continues to experience decline in functional status.  Currently requiring assistance with all ADLs aside from feeding.  It took several hours just to get her in the car to get to appt today.  Also complains of urinary urgency over past couple of days.  PO intake and hydration are otherwise stable.  No issues with Eliquis.  Fatigue continues to be very persistent and problematic.  Denies seizures or headaches today.  H+P (07/20/21) Patient presented to medical attention last month with several days, weeks of progressive mental status changes.  Family noticed she was confused and disoriented over normal conversation and activities of daily living.  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  CNS imaging demonstrated an enhancing mass in the right temporal lobe; she underwent craniotomy, resection with Dr. DReatha Armouron 07/06/21, which demonstrated glioblastoma.  Following  surgery she experienced worsening left sided weakness, which prompted rehabilitation admission.  At present, she is doing some walking on her own, albeit slowly.  Left arm and hand have improved, she is starting occupational therapy this week.  Medications: Current Outpatient Medications on File Prior to Visit  Medication Sig Dispense Refill   acetaminophen (TYLENOL) 325 MG tablet Take 2 tablets (650 mg total) by mouth every 6 (six) hours as needed for mild pain (or Fever >/= 101).     ALPRAZolam (XANAX) 0.25 MG tablet Take 1 tablet (0.25 mg total) by mouth 2 (two) times daily as needed for anxiety. 15 tablet 0   APIXABAN (ELIQUIS) VTE STARTER PACK (10MG AND 5MG) Take as directed on package: start with two-548mtablets twice daily for 7 days. On day 8, switch to one-6m74mablet twice daily. 1 each 0   b complex vitamins capsule Take 1 capsule by mouth daily.     cetirizine (ZYRTEC) 10 MG tablet Take 10 mg by mouth daily.     cholecalciferol (VITAMIN D) 1000 UNITS tablet Take 2,000 Units by mouth daily.     dexamethasone (DECADRON) 1 MG tablet Take 1 tablet (1 mg total) by mouth daily. 60 tablet 1   docusate sodium (COLACE) 100 MG capsule Take 1 capsule (100 mg total) by mouth 2 (two) times daily. 10 capsule 0   famotidine (PEPCID) 20 MG tablet Take 1 tablet (20 mg total) by mouth daily. 30 tablet 2   fluticasone (FLONASE) 50 MCG/ACT nasal spray Place 1 spray into both nostrils daily as needed for allergies. OTC     furosemide (LASIX) 40 MG tablet furosemide 40 mg  tablet     levothyroxine (SYNTHROID) 50 MCG tablet Take 1 tablet (50 mcg total) by mouth daily. 30 tablet 0   Multiple Vitamins-Minerals (MULTIVITAMIN WITH MINERALS) tablet Take 1 tablet by mouth daily.     NON FORMULARY Apply topically as needed. Magne Sport Balm Apply to skin as needed for joint/muscle pain     ondansetron (ZOFRAN) 8 MG tablet Take 1 tablet by mouth 2 times daily as needed (nausea and vomiting). May take 30-60 minutes  prior to Temodar administration if nausea/vomiting occurs. 30 tablet 1   temozolomide (TEMODAR) 140 MG capsule Take 1 capsule (140 mg total) by mouth daily. May take on an empty stomach to decrease nausea & vomiting. 5 capsule 0   temozolomide (TEMODAR) 180 MG capsule Take 1 capsule (180 mg total) by mouth daily. May take on an empty stomach to decrease nausea & vomiting. 5 capsule 0   No current facility-administered medications on file prior to visit.    Allergies:  Allergies  Allergen Reactions   Fexofenadine Hives   Penicillins Itching and Other (See Comments)    Pulling sensation in body   Past Medical History:  Past Medical History:  Diagnosis Date   Glioblastoma ([place])    Hyperlipidemia    Hypertension    Pneumonia    Thyroid disease    Past Surgical History:  Past Surgical History:  Procedure Laterality Date   ABDOMINAL HYSTERECTOMY  3220   APPLICATION OF CRANIAL NAVIGATION Right 07/06/2021   Procedure: APPLICATION OF CRANIAL NAVIGATION;  Surgeon: Karsten Ro, DO;  Location: [facility];  Service: Neurosurgery;  Laterality: Right;   CRANIOTOMY Right 07/06/2021   Procedure: CRANIOTOMY TUMOR EXCISION;  Surgeon: Karsten Ro, DO;  Location: [facility];  Service: Neurosurgery;  Laterality: Right;   TONSILLECTOMY  1965   TOOTH EXTRACTION     Social History:  Social History   Socioeconomic History   Marital status: Divorced    Spouse name: Not on file   Number of children: 3   Years of education: 20   Highest education level: Not on file  Occupational History   Occupation: Masters degree in Psychologist, counselling: [employer]   Occupation: Working on Engineer, maintenance   Occupation: Training and development officer of [employer]  Tobacco Use   Smoking status: Never   Smokeless tobacco: Never  Vaping Use   Vaping Use: Never used  Substance and Sexual Activity   Alcohol use: No   Drug use: No   Sexual activity: Yes    Birth  control/protection: Surgical  Other Topics Concern   Not on file  Social History Narrative   Divorced, lives in [place] alone.  Independent of ADLs.   Social Determinants of Health   Financial Resource Strain: Not on file  Food Insecurity: Not on file  Transportation Needs: Not on file  Physical Activity: Not on file  Stress: Not on file  Social Connections: Not on file  Intimate Partner Violence: Not on file   Family History:  Family History  Problem Relation Age of Onset   Diabetes Mother    Hypertension Mother    Hypertension Father    Leukemia Father    Stroke Father    Cancer Brother    Pneumonia Brother     Review of Systems: Constitutional: Doesn't report fevers, chills or abnormal weight loss Eyes: Doesn't report blurriness of vision Ears, nose, mouth, throat, and face: Doesn't report sore throat Respiratory: Doesn't report  cough, dyspnea or wheezes Cardiovascular: Doesn't report palpitation, chest discomfort  Gastrointestinal:  Doesn't report nausea, constipation, diarrhea GU: Doesn't report incontinence Skin: Doesn't report skin rashes Neurological: Per HPI Musculoskeletal: Doesn't report joint pain Behavioral/Psych: Doesn't report anxiety  Physical Exam: Vitals:   11/02/21 1250  BP: 120/67  Pulse: 94  Resp: 18  Temp: 97.7 F (36.5 C)  SpO2: 100%    KPS: 60 ECOG 1 General: wheelchair bound Head: Normal EENT: No conjunctival injection or scleral icterus.  Lungs: Resp effort normal Cardiac: Regular rate Abdomen: Non-distended abdomen Skin: normal Extremities: 2+ dependent edema R>L  Neurologic Exam: Mental Status: Awake, alert, attentive to examiner. Oriented to self and environment. Language is fluent with intact comprehension.  Cranial Nerves: Visual acuity is grossly normal. Visual fields are full. Extra-ocular movements intact. No ptosis. Face is symmetric Motor: Tone and bulk are normal. Power is 4/5 in left arm and leg, asterixis  noted with posturing. Reflexes are symmetric, no pathologic reflexes present.  Sensory: Intact to light touch Gait: Hemiparetic  Labs: I have reviewed the data as listed    Component Value Date/Time   NA 138 10/25/2021 2258   K 4.2 10/25/2021 2258   CL 108 10/25/2021 2258   CO2 21 (L) 10/25/2021 2258   GLUCOSE 94 10/25/2021 2258   BUN 27 (H) 10/25/2021 2258   CREATININE 0.99 10/25/2021 2258   CREATININE 1.21 (H) 10/09/2021 1200   CALCIUM 8.2 (L) 10/25/2021 2258   PROT 6.2 (L) 10/09/2021 1200   ALBUMIN 3.6 10/09/2021 1200   AST 13 (L) 10/09/2021 1200   ALT 18 10/09/2021 1200   ALKPHOS 65 10/09/2021 1200   BILITOT 0.4 10/09/2021 1200   GFRNONAA >60 10/25/2021 2258   GFRNONAA 49 (L) 10/09/2021 1200   GFRAA 70 (L) 07/17/2011 0311   Lab Results  Component Value Date   WBC 5.4 11/02/2021   NEUTROABS 4.1 11/02/2021   HGB 10.0 (L) 11/02/2021   HCT 30.6 (L) 11/02/2021   MCV 97.1 11/02/2021   PLT 230 11/02/2021    Assessment/Plan 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HCC)  Goals of care, counseling/discussion40 minutes  Hannah Beat Wilden is clinically progressive today, now s/p DVT/PE and oral anticoagulation.  Etiology of progression is either inflammatory or neoplastic, still favor the former at this time based on recent imaging.  We will hold off on cytotoxic chemo for now because of poor and declining functional status.    We did discuss goals of care, and recommended either transition to avastin monotherapy or palliative/hospice.  She would like to continue treatments, but is also agreeable to meet with palliative care provider soon.  Side effects of avastin were reviewed, including hypertension, bleeding clotting, wound healing impairment.    Will check urine today given urethritis symptoms.  Decadron will con't 2m daily for now.    Will con't PT/OT.  We ask that KHannah BeatNorcott and her family review goals of care discussion and decision pathway.   We expect to hear from them shortly with preferred plan of care.  Avastin could start as early as next week.  All questions were answered. The patient knows to call the clinic with any problems, questions or concerns. No barriers to learning were detected.  The total time spent in the encounter was 40 minutes and more than 50% was on counseling and review of test results   ZVentura Sellers MD Medical Director of Neuro-Oncology [facility]05/25/23 1:08 PM

## 2021-11-03 ENCOUNTER — Ambulatory Visit: Payer: BC Managed Care – PPO

## 2021-11-03 LAB — URINE CULTURE: Culture: <10000 — AB

## 2021-11-03 NOTE — Therapy (Signed)
[facility] [address] [place], [place], [address] Phone: [phone]   Fax:  [phone]  Patient Details  Name: Denise Wright MRN: [id number] Date of Birth: January 05, 1951 Referring Provider: Lauraine Rinne, PA-C  Encounter Date: 11/03/2021  SPEECH THERAPY DISCHARGE SUMMARY  Visits from Start of Care:15  Current functional level related to goals / functional outcomes: Maris made some limited improvements in cognitive linguistic functioning related to communication, awareness, and attention; however, fluctuations in performance noted which may be related to CA tx. Intermittent confusion and varied error awareness exhibited, in which SLP generated strategies to aid functional cognitive linguistic functioning at home. Some carryover indicated when patient able with usual limitations secondary to fatigue. Due to plateau in progress, recommend d/c from ST at this time.    Remaining deficits: Fluctuations in attention, awareness, executive functioning    Education / Equipment: Compensatory strategy training, error awareness tasks, caregiver education   Patient agrees to discharge. Patient goals were partially met. Patient is being discharged due to maximized rehab potential.    SLP Short Term Goals - 10/11/21 1323                SLP SHORT TERM GOAL #1    Title pt will complete standardized testing     Status Achieved     Target Date 08/25/21          SLP SHORT TERM GOAL #2    Title Pt will verbalize 3 attention/memory compensations for making/tracking appointments, managing meds, and other daily tasks in 2 sessions     Baseline 08-23-21     Status Partially Met     Target Date 09/08/21          SLP SHORT TERM GOAL #3    Title pt will verbally generate and carryover (with min A) solutions to problems encountered in everyday living in 2 sessions     Baseline 08-23-21     Status Partially Met     Target Date 09/22/21           SLP SHORT TERM GOAL #4    Title pt will verbally state errors on mod complex written or verbal tasks with nonverbal cues     Baseline 08-23-21     Status Partially Met     Target Date 09/22/21                     SLP Long Term Goals - 10/27/21 1017                SLP LONG TERM GOAL #1    Title Pt will verbalize AND IMPLEMEMENT 3 attention/memory compensations for making/tracking appointments, managing meds, and other daily tasks in 4 sessions     Baseline 10-06-21     Status Partially Met          SLP LONG TERM GOAL #2    Title Pt will score higher on a patient-reported outcome measure in the last 1-2 visits than when taken in the first 2 sessions     Time 2     Period Weeks     Status Not Met         SLP LONG TERM GOAL #3    Title pt will verbally generate and independently carryover solutions to problems encountered in everyday living with min A in 4 sessions     Time 2     Period Weeks     Status Partially Met  SLP LONG TERM GOAL #4    Title pt will note verbal or written errors in work-like tasks independently for improving verbal and/or written accuracy     Time 2     Period Weeks     Status Not Met       Marzetta Board, CCC-SLP 11/03/2021, 12:37 PM  [facility] [address] [place], [place], [address] Phone: [phone]   Fax:  [phone]

## 2021-11-07 ENCOUNTER — Telehealth: Payer: Self-pay | Admitting: *Deleted

## 2021-11-07 ENCOUNTER — Encounter: Payer: Self-pay | Admitting: Internal Medicine

## 2021-11-07 NOTE — Telephone Encounter (Signed)
Patients daughter called to give update since last Thursday visit.  States that patient hasn't walked since last visit and they are requesting hospital bed and hoyer lift order.  Order placed through parachute.  They also have decided on proceeding with Avastin treatment and want the in house palliative referral as well.   Daughter was concerned about how to get the patient here to her visits.  Explained options.  She also questioned if the steroid dosing should be adjusted.  Reports only taking 1 mg of Decadron daily.   Routed to provider.

## 2021-11-08 ENCOUNTER — Encounter: Payer: Self-pay | Admitting: Internal Medicine

## 2021-11-08 NOTE — Progress Notes (Signed)
The following biosimilar Mvasi (bevacizumab-awwb) has been selected for use in this patient.  Kennith Center, Pharm.D., CPP 11/08/2021'@9'$ :52 AM

## 2021-11-10 ENCOUNTER — Encounter (HOSPITAL_COMMUNITY): Payer: Self-pay

## 2021-11-10 ENCOUNTER — Other Ambulatory Visit: Payer: Self-pay

## 2021-11-10 ENCOUNTER — Encounter: Payer: Self-pay | Admitting: Emergency Medicine

## 2021-11-10 ENCOUNTER — Inpatient Hospital Stay (HOSPITAL_COMMUNITY)
Admission: EM | Admit: 2021-11-10 | Discharge: 2021-11-17 | DRG: 081 | Disposition: A | Discharge status: Skilled Nursing Facility | Payer: BC Managed Care – PPO | Attending: Internal Medicine | Admitting: Internal Medicine

## 2021-11-10 ENCOUNTER — Emergency Department (HOSPITAL_COMMUNITY): Payer: BC Managed Care – PPO

## 2021-11-10 DIAGNOSIS — K5900 Constipation, unspecified: Secondary | ICD-10-CM | POA: Diagnosis present

## 2021-11-10 DIAGNOSIS — C719 Malignant neoplasm of brain, unspecified: Secondary | ICD-10-CM | POA: Diagnosis not present

## 2021-11-10 DIAGNOSIS — Z90710 Acquired absence of both cervix and uterus: Secondary | ICD-10-CM | POA: Diagnosis not present

## 2021-11-10 DIAGNOSIS — Z888 Allergy status to other drugs, medicaments and biological substances status: Secondary | ICD-10-CM

## 2021-11-10 DIAGNOSIS — Z515 Encounter for palliative care: Secondary | ICD-10-CM

## 2021-11-10 DIAGNOSIS — K219 Gastro-esophageal reflux disease without esophagitis: Secondary | ICD-10-CM | POA: Diagnosis not present

## 2021-11-10 DIAGNOSIS — Z923 Personal history of irradiation: Secondary | ICD-10-CM

## 2021-11-10 DIAGNOSIS — F411 Generalized anxiety disorder: Secondary | ICD-10-CM | POA: Diagnosis not present

## 2021-11-10 DIAGNOSIS — Z8249 Family history of ischemic heart disease and other diseases of the circulatory system: Secondary | ICD-10-CM | POA: Diagnosis not present

## 2021-11-10 DIAGNOSIS — Z7901 Long term (current) use of anticoagulants: Secondary | ICD-10-CM

## 2021-11-10 DIAGNOSIS — N3000 Acute cystitis without hematuria: Secondary | ICD-10-CM | POA: Diagnosis present

## 2021-11-10 DIAGNOSIS — C712 Malignant neoplasm of temporal lobe: Secondary | ICD-10-CM | POA: Diagnosis not present

## 2021-11-10 DIAGNOSIS — Z79890 Hormone replacement therapy (postmenopausal): Secondary | ICD-10-CM | POA: Diagnosis not present

## 2021-11-10 DIAGNOSIS — Z880 Allergy status to penicillin: Secondary | ICD-10-CM | POA: Diagnosis not present

## 2021-11-10 DIAGNOSIS — Z809 Family history of malignant neoplasm, unspecified: Secondary | ICD-10-CM | POA: Diagnosis not present

## 2021-11-10 DIAGNOSIS — Z86718 Personal history of other venous thrombosis and embolism: Secondary | ICD-10-CM | POA: Diagnosis not present

## 2021-11-10 DIAGNOSIS — Z85841 Personal history of malignant neoplasm of brain: Secondary | ICD-10-CM

## 2021-11-10 DIAGNOSIS — E039 Hypothyroidism, unspecified: Secondary | ICD-10-CM | POA: Diagnosis not present

## 2021-11-10 DIAGNOSIS — R4182 Altered mental status, unspecified: Principal | ICD-10-CM

## 2021-11-10 DIAGNOSIS — I10 Essential (primary) hypertension: Secondary | ICD-10-CM | POA: Diagnosis not present

## 2021-11-10 DIAGNOSIS — R627 Adult failure to thrive: Secondary | ICD-10-CM | POA: Diagnosis present

## 2021-11-10 DIAGNOSIS — E785 Hyperlipidemia, unspecified: Secondary | ICD-10-CM | POA: Diagnosis present

## 2021-11-10 DIAGNOSIS — G936 Cerebral edema: Secondary | ICD-10-CM | POA: Diagnosis not present

## 2021-11-10 DIAGNOSIS — G8194 Hemiplegia, unspecified affecting left nondominant side: Secondary | ICD-10-CM | POA: Diagnosis present

## 2021-11-10 DIAGNOSIS — Z6841 Body Mass Index (BMI) 40.0 and over, adult: Secondary | ICD-10-CM | POA: Diagnosis not present

## 2021-11-10 DIAGNOSIS — Z66 Do not resuscitate: Secondary | ICD-10-CM | POA: Diagnosis not present

## 2021-11-10 DIAGNOSIS — Z7189 Other specified counseling: Secondary | ICD-10-CM | POA: Diagnosis not present

## 2021-11-10 DIAGNOSIS — R5381 Other malaise: Secondary | ICD-10-CM

## 2021-11-10 DIAGNOSIS — Z79899 Other long term (current) drug therapy: Secondary | ICD-10-CM | POA: Diagnosis not present

## 2021-11-10 LAB — COMPREHENSIVE METABOLIC PANEL
ALT: 12 U/L (ref 0–44)
AST: 13 U/L — ABNORMAL LOW (ref 15–41)
Albumin: 3.2 g/dL — ABNORMAL LOW (ref 3.5–5.0)
Alkaline Phosphatase: 46 U/L (ref 38–126)
Anion gap: 8 (ref 5–15)
BUN: 21 mg/dL (ref 8–23)
CO2: 25 mmol/L (ref 22–32)
Calcium: 8.6 mg/dL — ABNORMAL LOW (ref 8.9–10.3)
Chloride: 104 mmol/L (ref 98–111)
Creatinine, Ser: 1.15 mg/dL — ABNORMAL HIGH (ref 0.44–1.00)
GFR, Estimated: 51 mL/min — ABNORMAL LOW (ref >60)
Glucose, Bld: 101 mg/dL — ABNORMAL HIGH (ref 70–99)
Potassium: 4.1 mmol/L (ref 3.5–5.1)
Sodium: 137 mmol/L (ref 135–145)
Total Bilirubin: 0.6 mg/dL (ref 0.3–1.2)
Total Protein: 6.5 g/dL (ref 6.5–8.1)

## 2021-11-10 LAB — CBC WITH DIFFERENTIAL/PLATELET
Abs Immature Granulocytes: 0.03 10*3/uL (ref 0.00–0.07)
Basophils Absolute: 0 10*3/uL (ref 0.0–0.1)
Basophils Relative: 1 %
Eosinophils Absolute: 0.1 10*3/uL (ref 0.0–0.5)
Eosinophils Relative: 2 %
HCT: 32.9 % — ABNORMAL LOW (ref 36.0–46.0)
Hemoglobin: 10.6 g/dL — ABNORMAL LOW (ref 12.0–15.0)
Immature Granulocytes: 1 %
Lymphocytes Relative: 10 %
Lymphs Abs: 0.6 10*3/uL — ABNORMAL LOW (ref 0.7–4.0)
MCH: 30.8 pg (ref 26.0–34.0)
MCHC: 32.2 g/dL (ref 30.0–36.0)
MCV: 95.6 fL (ref 80.0–100.0)
Monocytes Absolute: 0.7 10*3/uL (ref 0.1–1.0)
Monocytes Relative: 10 %
Neutro Abs: 4.9 10*3/uL (ref 1.7–7.7)
Neutrophils Relative %: 76 %
Platelets: 266 10*3/uL (ref 150–400)
RBC: 3.44 MIL/uL — ABNORMAL LOW (ref 3.87–5.11)
RDW: 13.3 % (ref 11.5–15.5)
WBC: 6.4 10*3/uL (ref 4.0–10.5)
nRBC: 0 % (ref 0.0–0.2)

## 2021-11-10 LAB — URINALYSIS, ROUTINE W REFLEX MICROSCOPIC
Bilirubin Urine: NEGATIVE
Glucose, UA: NEGATIVE mg/dL
Ketones, ur: NEGATIVE mg/dL
Leukocytes,Ua: NEGATIVE
Nitrite: NEGATIVE
Protein, ur: NEGATIVE mg/dL
Specific Gravity, Urine: 1.015 (ref 1.005–1.030)
pH: 6 (ref 5.0–8.0)

## 2021-11-10 LAB — BRAIN NATRIURETIC PEPTIDE: B Natriuretic Peptide: 39.6 pg/mL (ref 0.0–100.0)

## 2021-11-10 IMAGING — CR DG CHEST 2V
2 series · 2 of 2 positions shown · non-contrast
Comparison: Chest x-ray [DATE].

CLINICAL DATA: Decreased O2 sats.

EXAM:
CHEST - 2 VIEW

[w chest pa]
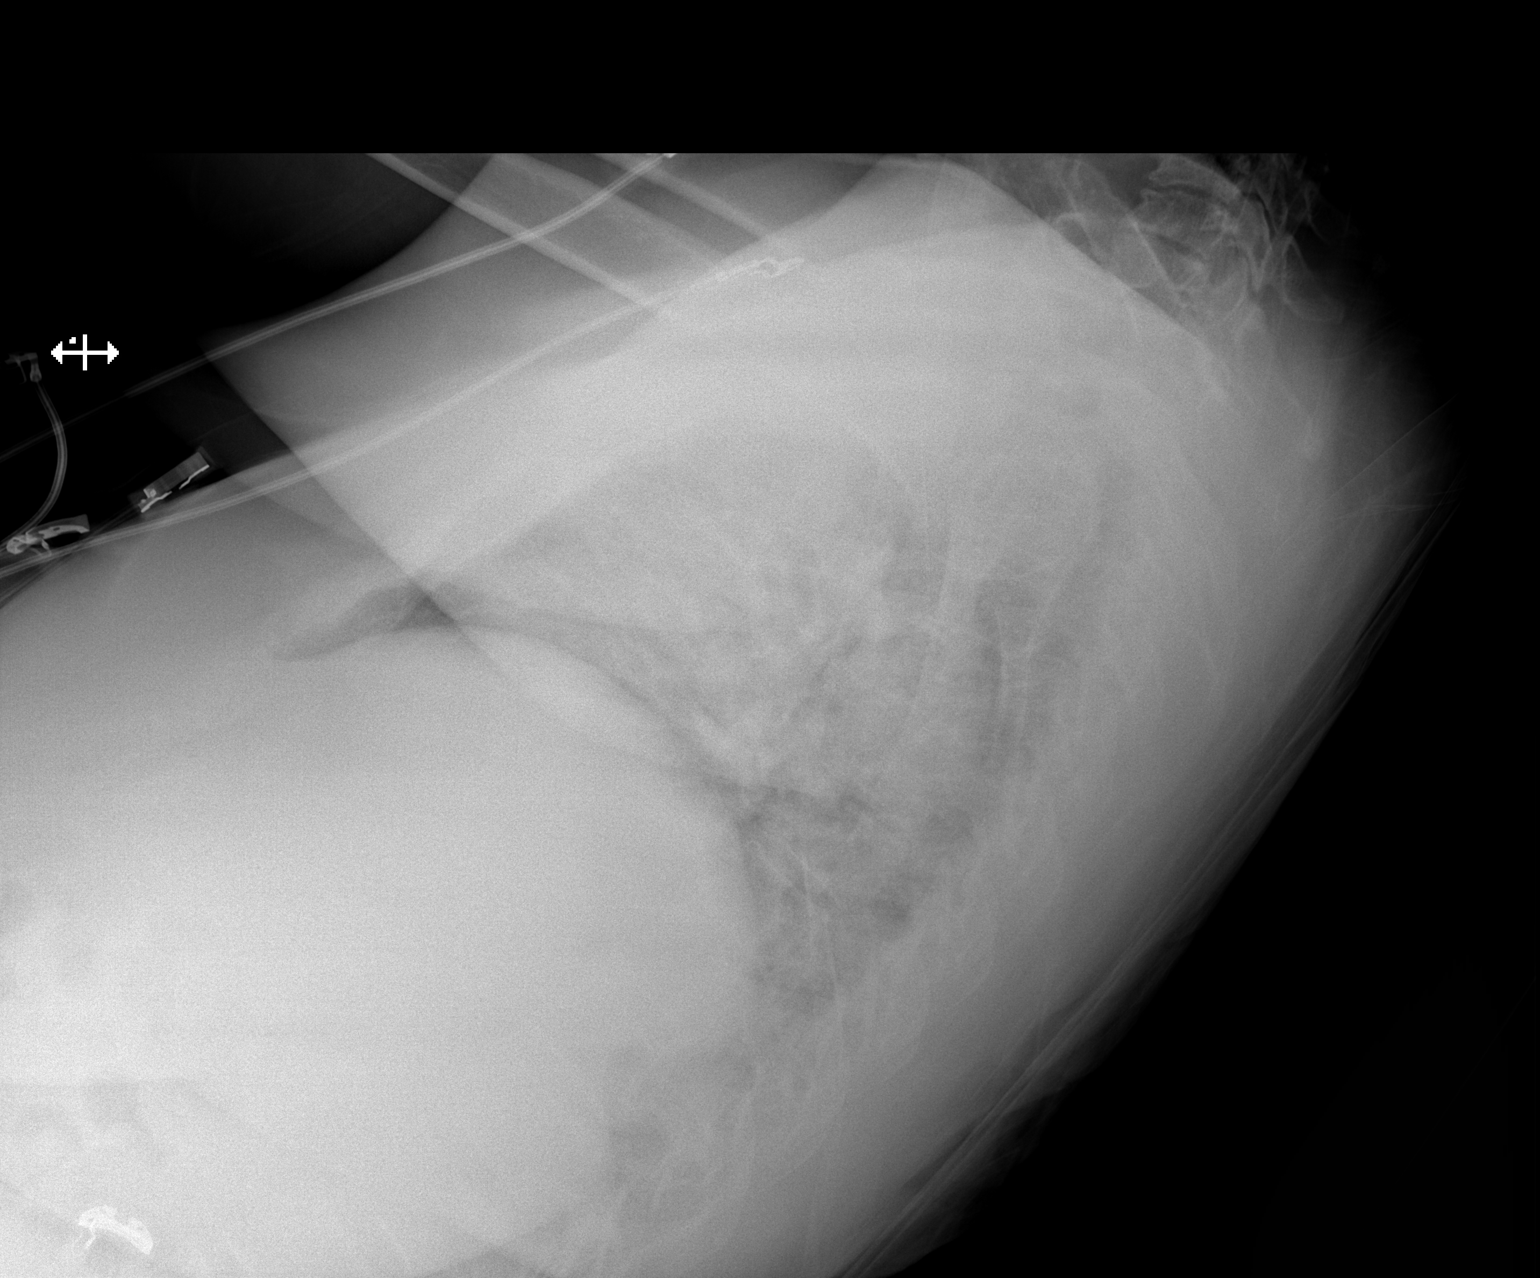

[x chest ap]
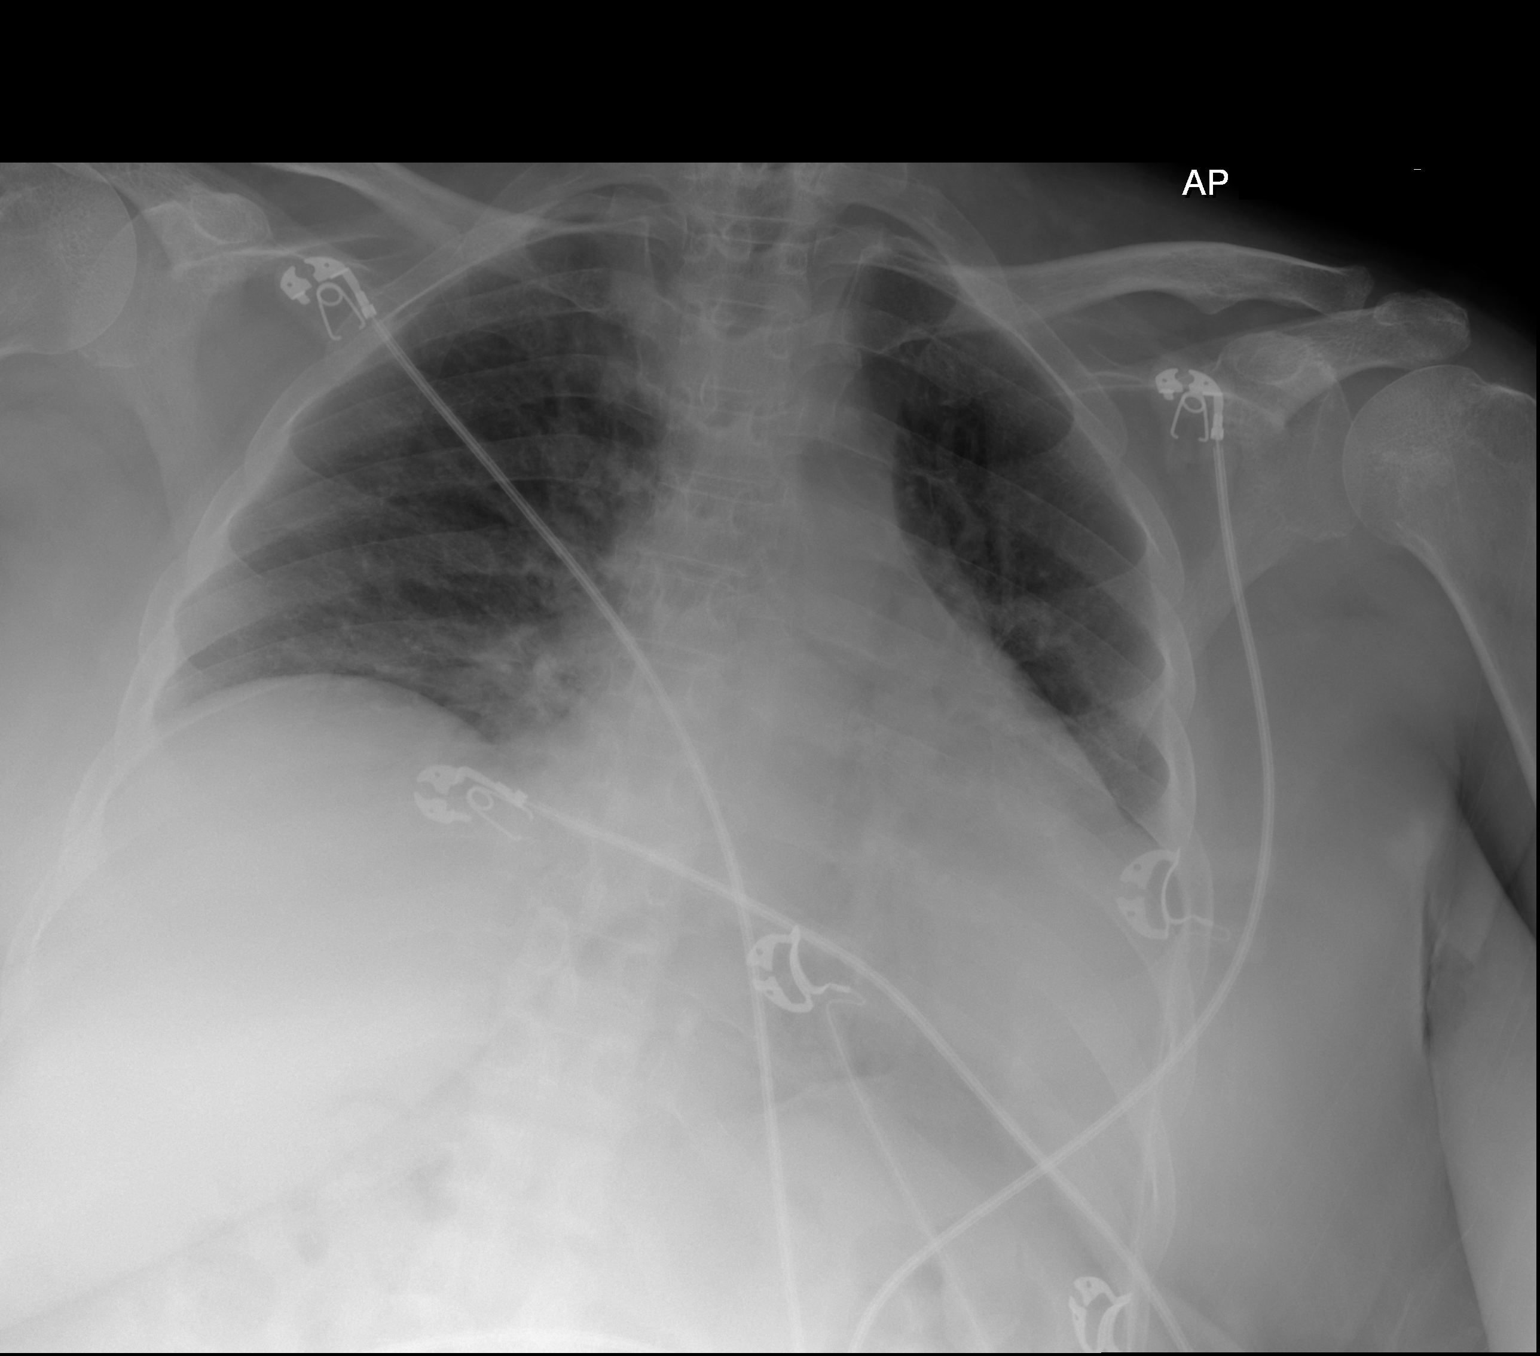

[2 of 2 positions shown; findings below may reference images not displayed]

FINDINGS: The heart is enlarged, unchanged. There is mild elevation of the
right hemidiaphragm. There is no definite focal lung infiltrate,
pleural effusion or pneumothorax. No acute fractures are seen.
IMPRESSION: 1. Cardiomegaly.
2. No evidence for pneumonia or edema.

## 2021-11-10 IMAGING — CT CT HEAD WO/W CM
3 of 4 series · 15 of 47 positions shown, 18 images · IV contrast (agent unspecified)
Comparison: Prior MRI from [DATE].

CLINICAL DATA: Initial evaluation for brain metastases suspected.

EXAM:
CT HEAD WITHOUT AND WITH CONTRAST
TECHNIQUE: Contiguous axial images were obtained from the base of the skull
through the vertex without and with intravenous contrast.

[Series 2: head wo · axial · 0.47mm/px · z∈[-156,-26]mm · 9 of 32 slices shown, 12 images]
[im 3/32  brain]
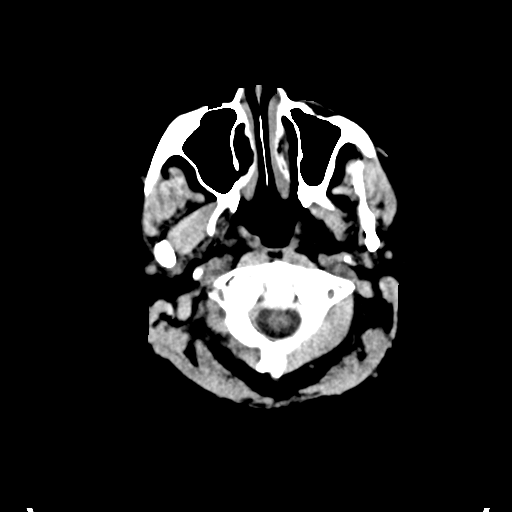
[im 3/32  bone]
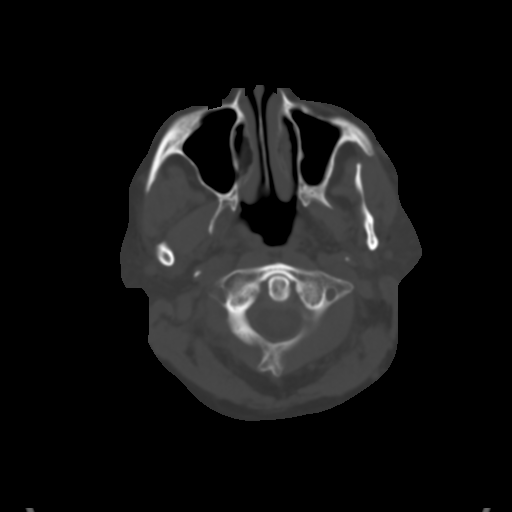
[im 7/32  brain]
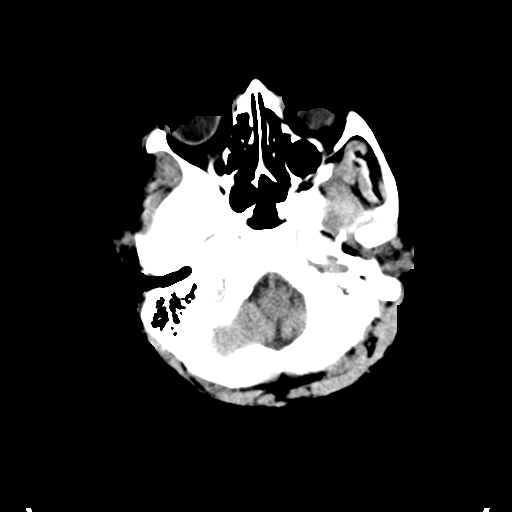
[im 9/32  brain]
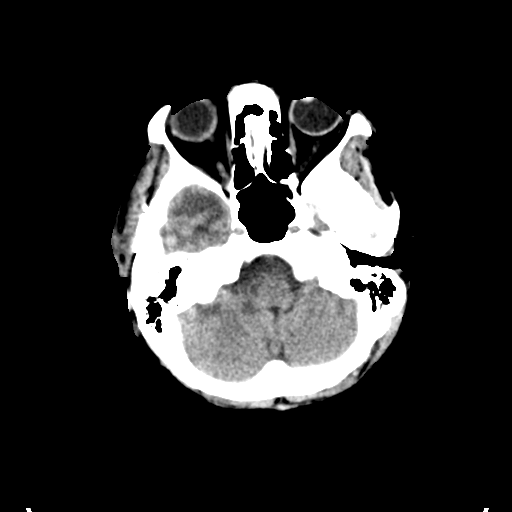
[im 14/32  brain]
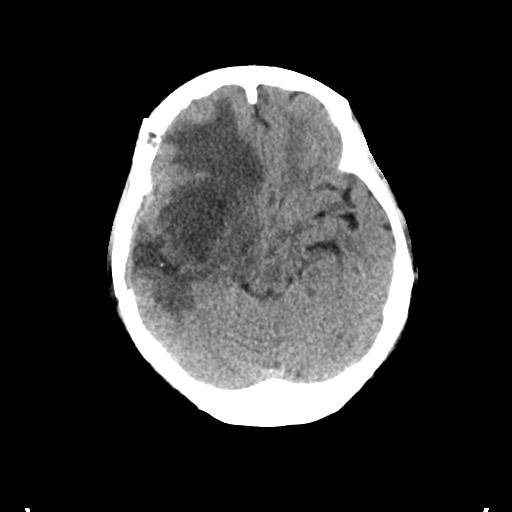
[im 16/32  brain]
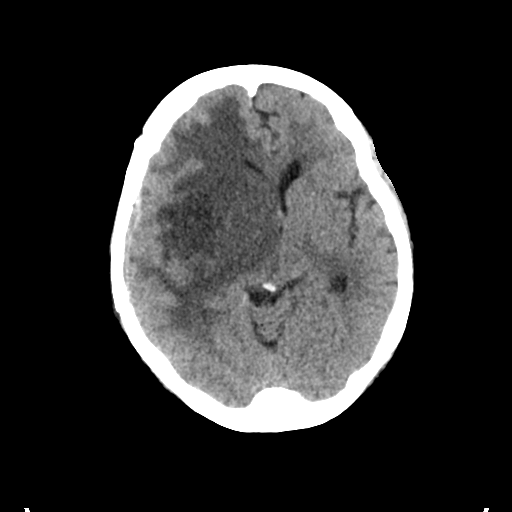
[im 16/32  bone]
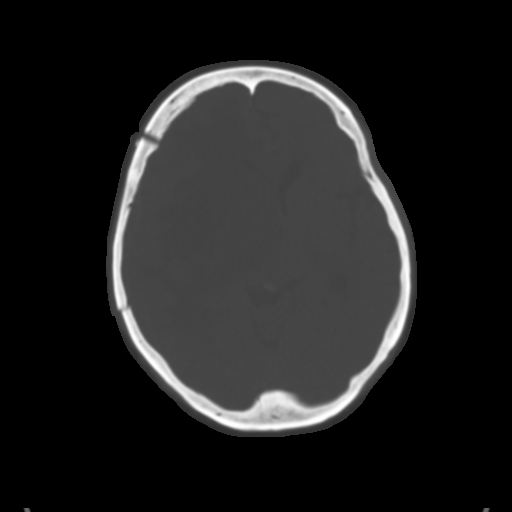
[im 18/32  brain]
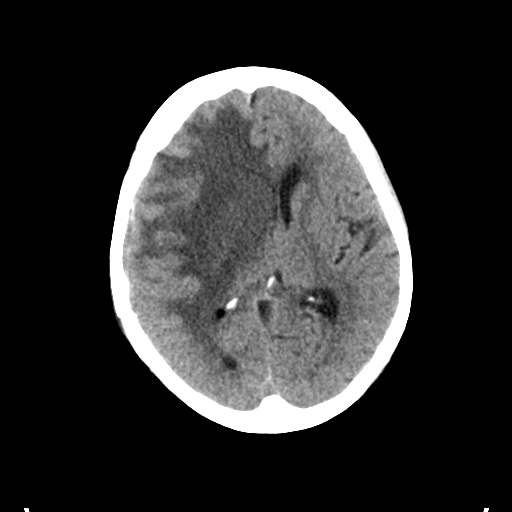
[im 23/32  brain]
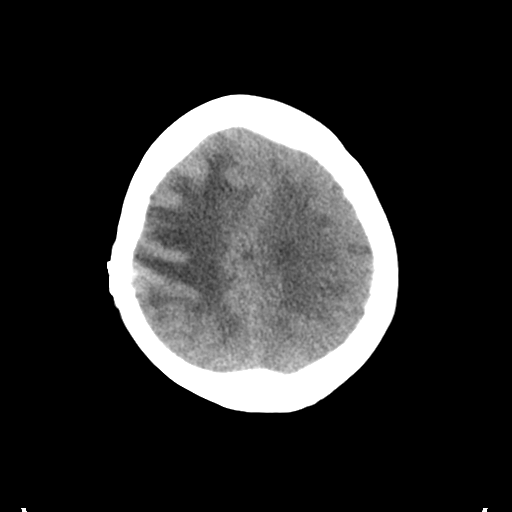
[im 25/32  brain]
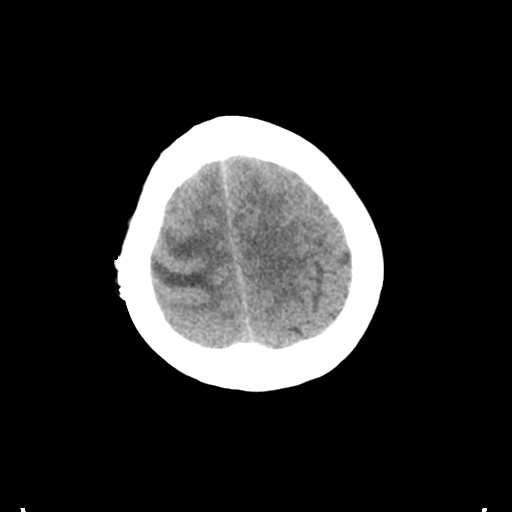
[im 29/32  brain]
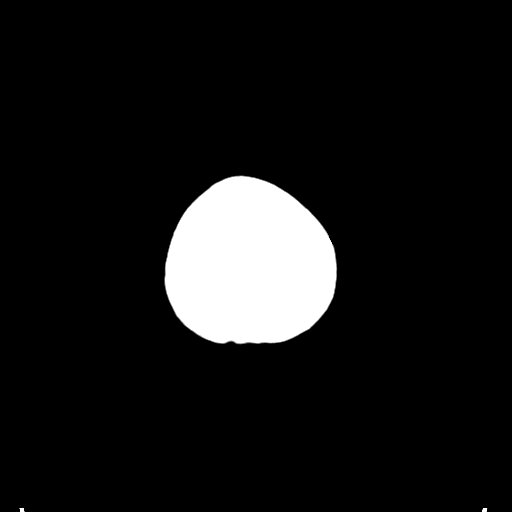
[im 29/32  bone]
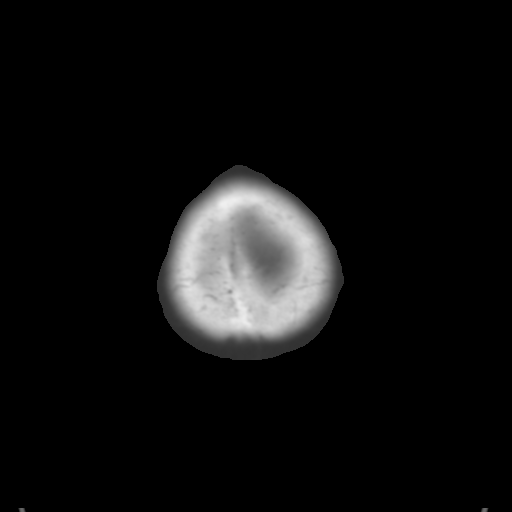

[Series 5: coronal soft tissue · coronal · 0.34mm/px · 3 of 67 slices shown]
[im 23/67  brain]
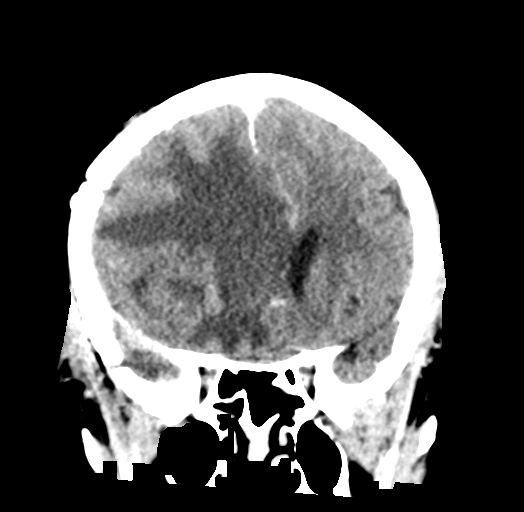
[im 30/67  brain]
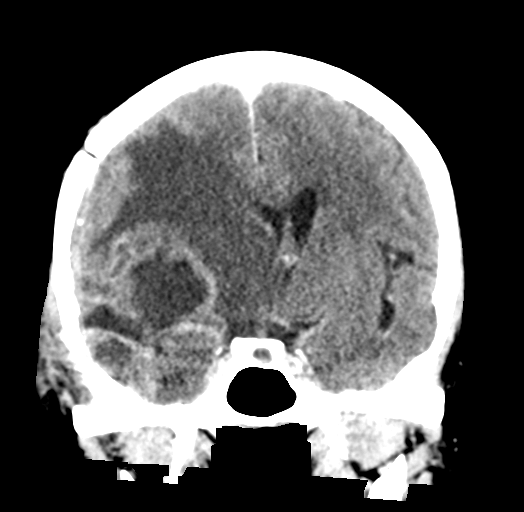
[im 37/67  brain]
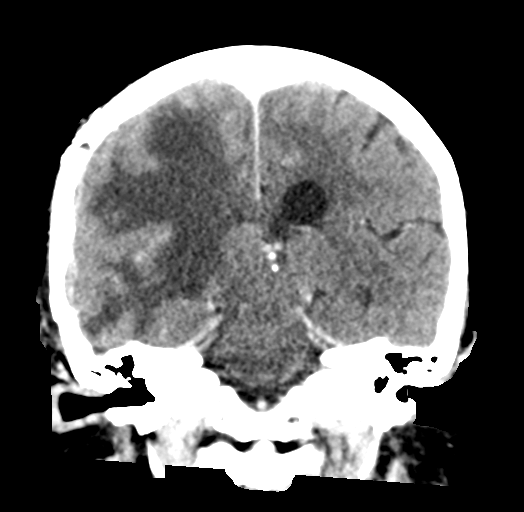

[Series 6: sagittal soft tissue · sagittal · 0.34mm/px · 3 of 60 slices shown]
[im 20/60  brain]
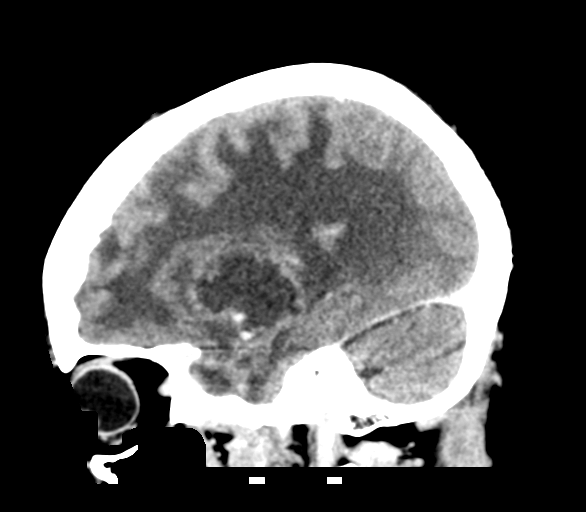
[im 30/60  brain]
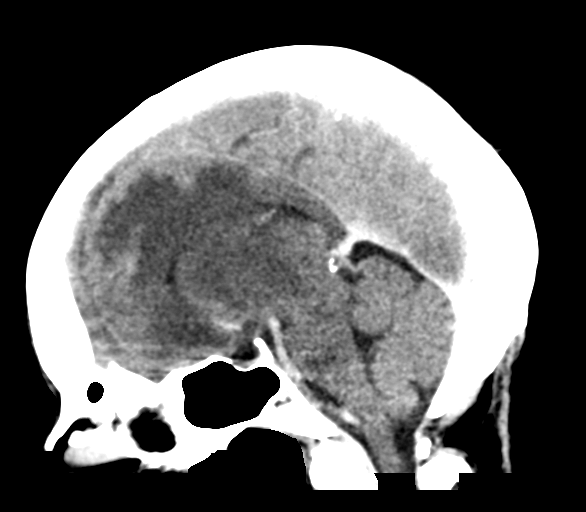
[im 40/60  brain]
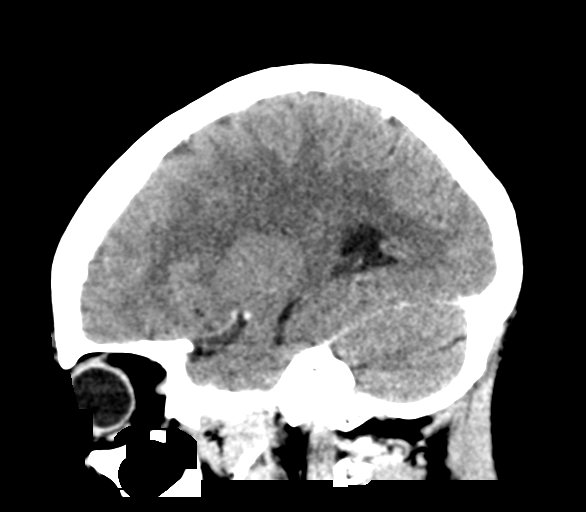

[15 of 47 positions shown; findings below may reference images not displayed]

RADIATION DOSE REDUCTION: This exam was performed according to the
departmental dose-optimization program which includes automated
exposure control, adjustment of the mA and/or kV according to
patient size and/or use of iterative reconstruction technique.

CONTRAST:  75mL OMNIPAQUE IOHEXOL 300 MG/ML  SOLN
FINDINGS: Brain: Postoperative changes from prior right-sided craniotomy for
tumor resection again seen. Underlying dural thickening and
enhancement, similar to previous. Prominent peripheral enhancement
again seen about the resection cavity, similar to previous.
Additionally, the overall resection cavity appears increased in size
from previous, now measuring up to 4.3 cm (series 4, image 14,
previously 3.4 cm when measured in a similar fashion on prior exam.
Extensive surrounding vasogenic edema with regional mass effect
appears somewhat worsened. Worsened mass effect on the right lateral
ventricle which is largely effaced. Associated right-to-left shift
now measures up to 9 mm. No overt ventricular trapping. Basilar
cisterns remain patent.

No other visible distant lesion or abnormal enhancement visible by
CT. No acute intracranial hemorrhage or superimposed large vessel
territory infarct. No other extra-axial fluid collection.

Vascular: No hyperdense vessel prior to contrast administration.
Normal intravascular enhancement seen following contrast
administration.

Skull: Prior right craniotomy. No acute scalp soft tissue
abnormality. Calvarium otherwise intact.

Sinuses/Orbits: Right gaze noted. Paranasal sinuses are clear. No
mastoid effusion.

Other: None.
IMPRESSION: 1. Postoperative changes from prior right-sided craniotomy for tumor
resection. Prominent peripheral enhancement again seen about the
resection cavity, with overall increased size of the cavity as
compared to previous. Findings consistent with progressive disease.
2. Worsened surrounding vasogenic edema and regional mass effect.
Associated right-to-left midline shift now measures up to 9 mm. No
ventricular trapping.

## 2021-11-10 MED ORDER — DEXAMETHASONE SODIUM PHOSPHATE 10 MG/ML IJ SOLN
10.0000 mg | INTRAMUSCULAR | Status: DC
  Administered 2021-11-11 – 2021-11-17 (×7): 10 mg via INTRAVENOUS
  Filled 2021-11-10 (×7): qty 1

## 2021-11-10 MED ORDER — ACETAMINOPHEN 650 MG RE SUPP: 650.0000 mg | Freq: Four times a day (QID) | RECTAL | Status: DC | PRN

## 2021-11-10 MED ORDER — ACETAMINOPHEN 325 MG PO TABS
650.0000 mg | ORAL_TABLET | Freq: Four times a day (QID) | ORAL | Status: DC | PRN
  Administered 2021-11-13 – 2021-11-14 (×3): 650 mg via ORAL
  Filled 2021-11-10 (×3): qty 2

## 2021-11-10 MED ORDER — IOHEXOL 300 MG/ML  SOLN
75.0000 mL | Freq: Once | INTRAMUSCULAR | Status: AC | PRN
  Administered 2021-11-10: 75 mL via INTRAVENOUS

## 2021-11-10 MED ORDER — DEXAMETHASONE SODIUM PHOSPHATE 10 MG/ML IJ SOLN
10.0000 mg | Freq: Once | INTRAMUSCULAR | Status: AC
  Administered 2021-11-11: 10 mg via INTRAVENOUS
  Filled 2021-11-10: qty 1

## 2021-11-10 MED ORDER — LACTATED RINGERS IV SOLN: INTRAVENOUS | Status: DC

## 2021-11-10 NOTE — ED Triage Notes (Signed)
Pt BIB EMS from home. Patient family was told to bring patient to ER due to o2 reading 85 at home. Upon EMS arrival, patients o2 sats was 96% on room air. Patient has no complaints at this time.

## 2021-11-10 NOTE — ED Provider Notes (Signed)
[facility] DEPT Provider Note   CSN: [id number] Arrival date & time: 11/10/21  1816     History  Chief Complaint  Patient presents with   Medical Clearance    Denise Wright is a 71 y.o. female.  71 year old female with history of glioblastoma presents due to low O2 sats from home.  Patient had O2 sat in the 70s to 80s.  This was taken by her daughter.  Patient also has had decreased level of consciousness for about a week.  She has had decreased oral intake.  According to the daughter, patient has had increased cerebral edema.  Patient has baseline left-sided deficits from her known brain tumor.  No reported history of fever.     Home Medications Prior to Admission medications   Medication Sig Start Date End Date Taking? Authorizing Provider  acetaminophen (TYLENOL) 325 MG tablet Take 2 tablets (650 mg total) by mouth every 6 (six) hours as needed for mild pain (or Fever >/= 101). 07/21/21   Angiulli, Lavon Paganini, PA-C  ALPRAZolam Duanne Moron) 0.25 MG tablet Take 1 tablet (0.25 mg total) by mouth 2 (two) times daily as needed for anxiety. 10/18/21   Meredith Staggers, MD  APIXABAN Arne Cleveland) VTE STARTER PACK ('10MG'$  AND '5MG'$ ) Take as directed on package: start with two-'5mg'$  tablets twice daily for 7 days. On day 8, switch to one-'5mg'$  tablet twice daily. 10/25/21   Ventura Sellers, MD  b complex vitamins capsule Take 1 capsule by mouth daily.    [provider]  cetirizine (ZYRTEC) 10 MG tablet Take 10 mg by mouth daily.    [provider]  cholecalciferol (VITAMIN D) 1000 UNITS tablet Take 2,000 Units by mouth daily.    [provider]  dexamethasone (DECADRON) 1 MG tablet Take 1 tablet (1 mg total) by mouth daily. 10/23/21   Vaslow, Acey Lav, MD  docusate sodium (COLACE) 100 MG capsule Take 1 capsule (100 mg total) by mouth 2 (two) times daily. 07/21/21   Angiulli, Lavon Paganini, PA-C  famotidine (PEPCID) 20 MG tablet Take 1 tablet (20 mg total)  by mouth daily. 08/31/21   Ventura Sellers, MD  fluticasone (FLONASE) 50 MCG/ACT nasal spray Place 1 spray into both nostrils daily as needed for allergies. OTC    [provider]  furosemide (LASIX) 40 MG tablet furosemide 40 mg tablet    [provider]  levothyroxine (SYNTHROID) 50 MCG tablet Take 1 tablet (50 mcg total) by mouth daily. 07/21/21   Angiulli, Lavon Paganini, PA-C  Multiple Vitamins-Minerals (MULTIVITAMIN WITH MINERALS) tablet Take 1 tablet by mouth daily.    [provider]  NON FORMULARY Apply topically as needed. Magne Sport Balm Apply to skin as needed for joint/muscle pain 08/09/21   [provider]  ondansetron (ZOFRAN) 8 MG tablet Take 1 tablet by mouth 2 times daily as needed (nausea and vomiting). May take 30-60 minutes prior to Temodar administration if nausea/vomiting occurs. 10/23/21   Ventura Sellers, MD  temozolomide (TEMODAR) 140 MG capsule Take 1 capsule (140 mg total) by mouth daily. May take on an empty stomach to decrease nausea & vomiting. 10/23/21   Ventura Sellers, MD  temozolomide (TEMODAR) 180 MG capsule Take 1 capsule (180 mg total) by mouth daily. May take on an empty stomach to decrease nausea & vomiting. 10/23/21   Ventura Sellers, MD      Allergies    Fexofenadine and Penicillins    Review of Systems  Review of Systems  All other systems reviewed and are negative.  Physical Exam Updated Vital Signs BP 132/85   Pulse 79   Temp 98.7 F (37.1 C) (Oral)   Resp 19   Ht 1.588 m (5' 2.5")   Wt 105 kg   SpO2 100%   BMI 41.66 kg/m  Physical Exam Vitals and nursing note reviewed.  Constitutional:      General: She is not in acute distress.    Appearance: Normal appearance. She is well-developed. She is not toxic-appearing.  HENT:     Head: Normocephalic and atraumatic.  Eyes:     General: Lids are normal.     Conjunctiva/sclera: Conjunctivae normal.     Pupils: Pupils are equal, round, and reactive to light.   Neck:     Thyroid: No thyroid mass.     Trachea: No tracheal deviation.  Cardiovascular:     Rate and Rhythm: Normal rate and regular rhythm.     Heart sounds: Normal heart sounds. No murmur heard.   No gallop.  Pulmonary:     Effort: Pulmonary effort is normal. No respiratory distress.     Breath sounds: Normal breath sounds. No stridor. No decreased breath sounds, wheezing, rhonchi or rales.  Abdominal:     General: There is no distension.     Palpations: Abdomen is soft.     Tenderness: There is no abdominal tenderness. There is no rebound.  Musculoskeletal:        General: No tenderness. Normal range of motion.     Cervical back: Normal range of motion and neck supple.  Skin:    General: Skin is warm and dry.     Findings: No abrasion or rash.  Neurological:     Mental Status: She is oriented to person, place, and time. She is lethargic and confused.     GCS: GCS eye subscore is 4. GCS verbal subscore is 4. GCS motor subscore is 5.     Cranial Nerves: Cranial nerves 2-12 are intact. No cranial nerve deficit.     Sensory: No sensory deficit.     Comments: Patient has baseline left-sided weakness from known brain tumor  Psychiatric:        Attention and Perception: She is inattentive.        Mood and Affect: Affect is flat.        Speech: Speech is delayed.        Behavior: Behavior is withdrawn.    ED Results / Procedures / Treatments   Labs (all labs ordered are listed, but only abnormal results are displayed) Labs Reviewed  CBC WITH DIFFERENTIAL/PLATELET  COMPREHENSIVE METABOLIC PANEL  BRAIN NATRIURETIC PEPTIDE  URINALYSIS, ROUTINE W REFLEX MICROSCOPIC    EKG None  Radiology DG Chest 2 View  Result Date: 11/10/2021 CLINICAL DATA:  Decreased O2 sats. EXAM: CHEST - 2 VIEW COMPARISON:  Chest x-ray 07/13/2011. FINDINGS: The heart is enlarged, unchanged. There is mild elevation of the right hemidiaphragm. There is no definite focal lung infiltrate, pleural effusion  or pneumothorax. No acute fractures are seen. IMPRESSION: 1. Cardiomegaly. 2. No evidence for pneumonia or edema. Electronically Signed   By: Ronney Asters M.D.   On: 11/10/2021 19:38    Procedures Procedures    Medications Ordered in ED Medications  lactated ringers infusion (has no administration in time range)    ED Course/ Medical Decision Making/ A&P  Medical Decision Making Amount and/or Complexity of Data Reviewed Labs: ordered. Radiology: ordered.  Risk Prescription drug management.   Patient initially presented due to concern for hypoxia.  She has no evidence of that here.  Chest x-ray reviewed and per my interpretation showed no acute findings.  Daughter noted that patient has been somewhat more lethargic.  Head CT was ordered and per my review and interpretation shows worsening vasogenic edema edema.  Patient given Decadron 10 mg IV push.  Urinalysis shows increased white cells but patient has no urinary symptoms and the urine is both nitrite and leukocyte esterase negative..  Therefore we will hold off treating.  Plan will be for admission to hospital service with neurooncology consultation in the morning.  Will consult hospitalist for admission        Final Clinical Impression(s) / ED Diagnoses Final diagnoses:  None    Rx / DC Orders ED Discharge Orders     None         Lacretia Leigh, MD 11/10/21 2332

## 2021-11-10 NOTE — Research (Signed)
TRIAL: [id number] A Single Arm, Pilot Study of Ramipril for Preventing Radiation-Induced Cognitive Decline in Glioblastoma (GBM) Patients Receiving Brain Radiotherapy   2 MONTH POST RT VISIT   Contacted patient and her daughter via phone today for 2 month post RT phone call.  Verified correct patient identity using two identifiers.  Daughter [name] is in the room with patient.  ROI on file for all [facility] practices for daughter.   MEDICATION REVIEW: Patient and daughter verified the current medication list is correct.  Addition of Eliquis, zyrtec, and lasix verified.  Reports taking 1 mg decadron twice daily.  Restarted temodar 11/05/21.  Reported changes were recorded on the medication list and marked as reviewed. All other listed medications remain the same.    ADVERSE EVENTS:  All graded using CTCAE v. 5.0 Patient stopped taking Ramipril study drug on 09/11/2021 (greater than 30 days ago).  There have been no reportable AE's since last visit with Research on 10/09/2021. 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 Mickeal Skinner but pharmacy was unable to fill the prescription.  The family was concerned about the patient developing PEs given the known DVTs at the time, so they took her to the ED.  No action or reporting was required, unrelated to study drug per MD Vaslow.   STUDY STATUS:              Patient will continue off agent but on study.  Patient is undecided on moving forward with additional treatment with avastin or palliative care.  Patient/sister aware to expect 3 month f/u call in about one month.  Patient is not on Optune device treatment at this time.   The patient was thanked for their time and continued voluntary participation in this study. Patient Denise Wright has been provided direct contact information and is encouraged to contact this Nurse for any needs or questions.   Wells Guiles 'Learta CoddingNeysa Bonito, RN, BSN Clinical Research  Nurse I 11/10/21 4:34 PM

## 2021-11-11 ENCOUNTER — Encounter (HOSPITAL_COMMUNITY): Payer: Self-pay | Admitting: Internal Medicine

## 2021-11-11 DIAGNOSIS — G936 Cerebral edema: Secondary | ICD-10-CM | POA: Diagnosis not present

## 2021-11-11 DIAGNOSIS — F411 Generalized anxiety disorder: Secondary | ICD-10-CM | POA: Diagnosis not present

## 2021-11-11 DIAGNOSIS — Z86718 Personal history of other venous thrombosis and embolism: Secondary | ICD-10-CM

## 2021-11-11 DIAGNOSIS — N3000 Acute cystitis without hematuria: Secondary | ICD-10-CM | POA: Diagnosis present

## 2021-11-11 DIAGNOSIS — K219 Gastro-esophageal reflux disease without esophagitis: Secondary | ICD-10-CM | POA: Diagnosis present

## 2021-11-11 LAB — COMPREHENSIVE METABOLIC PANEL
ALT: 12 U/L (ref 0–44)
AST: 12 U/L — ABNORMAL LOW (ref 15–41)
Albumin: 3.1 g/dL — ABNORMAL LOW (ref 3.5–5.0)
Alkaline Phosphatase: 48 U/L (ref 38–126)
Anion gap: 7 (ref 5–15)
BUN: 22 mg/dL (ref 8–23)
CO2: 25 mmol/L (ref 22–32)
Calcium: 8.9 mg/dL (ref 8.9–10.3)
Chloride: 106 mmol/L (ref 98–111)
Creatinine, Ser: 0.97 mg/dL (ref 0.44–1.00)
GFR, Estimated: >60 mL/min (ref >60)
Glucose, Bld: 118 mg/dL — ABNORMAL HIGH (ref 70–99)
Potassium: 4 mmol/L (ref 3.5–5.1)
Sodium: 138 mmol/L (ref 135–145)
Total Bilirubin: 0.5 mg/dL (ref 0.3–1.2)
Total Protein: 6.4 g/dL — ABNORMAL LOW (ref 6.5–8.1)

## 2021-11-11 LAB — MAGNESIUM
Magnesium: 2.2 mg/dL (ref 1.7–2.4)
Magnesium: 2 mg/dL (ref 1.7–2.4)

## 2021-11-11 LAB — CBC WITH DIFFERENTIAL/PLATELET
Abs Immature Granulocytes: 0.16 10*3/uL — ABNORMAL HIGH (ref 0.00–0.07)
Basophils Absolute: 0 10*3/uL (ref 0.0–0.1)
Basophils Relative: 0 %
Eosinophils Absolute: 0 10*3/uL (ref 0.0–0.5)
Eosinophils Relative: 0 %
HCT: 31.7 % — ABNORMAL LOW (ref 36.0–46.0)
Hemoglobin: 10.5 g/dL — ABNORMAL LOW (ref 12.0–15.0)
Immature Granulocytes: 2 %
Lymphocytes Relative: 4 %
Lymphs Abs: 0.3 10*3/uL — ABNORMAL LOW (ref 0.7–4.0)
MCH: 31.2 pg (ref 26.0–34.0)
MCHC: 33.1 g/dL (ref 30.0–36.0)
MCV: 94.1 fL (ref 80.0–100.0)
Monocytes Absolute: 0.1 10*3/uL (ref 0.1–1.0)
Monocytes Relative: 2 %
Neutro Abs: 6.4 10*3/uL (ref 1.7–7.7)
Neutrophils Relative %: 92 %
Platelets: 255 10*3/uL (ref 150–400)
RBC: 3.37 MIL/uL — ABNORMAL LOW (ref 3.87–5.11)
RDW: 13.2 % (ref 11.5–15.5)
WBC: 7 10*3/uL (ref 4.0–10.5)
nRBC: 0 % (ref 0.0–0.2)

## 2021-11-11 LAB — TSH: TSH: 2.339 u[IU]/mL (ref 0.350–4.500)

## 2021-11-11 MED ORDER — SODIUM CHLORIDE 0.9 % IV SOLN
1.0000 g | INTRAVENOUS | Status: DC
  Administered 2021-11-11 – 2021-11-14 (×4): 1 g via INTRAVENOUS
  Filled 2021-11-11 (×4): qty 10

## 2021-11-11 MED ORDER — TRAMADOL HCL 50 MG PO TABS
50.0000 mg | ORAL_TABLET | Freq: Four times a day (QID) | ORAL | Status: DC | PRN
  Administered 2021-11-16: 50 mg via ORAL
  Filled 2021-11-11: qty 1

## 2021-11-11 MED ORDER — POLYETHYLENE GLYCOL 3350 17 G PO PACK
17.0000 g | PACK | Freq: Every day | ORAL | Status: DC | PRN
  Filled 2021-11-11: qty 1

## 2021-11-11 MED ORDER — FAMOTIDINE 20 MG PO TABS
20.0000 mg | ORAL_TABLET | Freq: Every day | ORAL | Status: DC
  Administered 2021-11-11 – 2021-11-17 (×7): 20 mg via ORAL
  Filled 2021-11-11 (×7): qty 1

## 2021-11-11 MED ORDER — ALPRAZOLAM 0.25 MG PO TABS
0.2500 mg | ORAL_TABLET | Freq: Every evening | ORAL | Status: DC | PRN
Start: 2021-11-11 — End: 2021-11-18
  Administered 2021-11-12 – 2021-11-15 (×2): 0.25 mg via ORAL
  Filled 2021-11-11 (×2): qty 1

## 2021-11-11 MED ORDER — APIXABAN 5 MG PO TABS
5.0000 mg | ORAL_TABLET | Freq: Two times a day (BID) | ORAL | Status: DC
  Administered 2021-11-11 – 2021-11-17 (×13): 5 mg via ORAL
  Filled 2021-11-11 (×13): qty 1

## 2021-11-11 MED ORDER — MORPHINE SULFATE (PF) 2 MG/ML IV SOLN: 2.0000 mg | INTRAVENOUS | Status: DC | PRN

## 2021-11-11 MED ORDER — DIPHENHYDRAMINE HCL 25 MG PO CAPS: 25.0000 mg | ORAL_CAPSULE | Freq: Four times a day (QID) | ORAL | Status: DC | PRN

## 2021-11-11 MED ORDER — APIXABAN 2.5 MG PO TABS
2.5000 mg | ORAL_TABLET | Freq: Two times a day (BID) | ORAL | Status: DC
  Administered 2021-11-11: 2.5 mg via ORAL
  Filled 2021-11-11: qty 1

## 2021-11-11 MED ORDER — ALPRAZOLAM 0.25 MG PO TABS
0.2500 mg | ORAL_TABLET | Freq: Once | ORAL | Status: DC
Start: 2021-11-11 — End: 2021-11-16

## 2021-11-11 MED ORDER — LEVOTHYROXINE SODIUM 50 MCG PO TABS
50.0000 ug | ORAL_TABLET | Freq: Every day | ORAL | Status: DC
  Administered 2021-11-11 – 2021-11-17 (×7): 50 ug via ORAL
  Filled 2021-11-11 (×7): qty 1

## 2021-11-11 NOTE — Plan of Care (Signed)

## 2021-11-11 NOTE — Assessment & Plan Note (Signed)
  #)   Generalized anxiety disorder: On as needed Xanax as an outpatient.  Plan: Continue home as needed Xanax.

## 2021-11-11 NOTE — Assessment & Plan Note (Signed)
  #)   GERD: documented h/o such; on Pepcid as outpatient.   Plan: continue home Pepcid.

## 2021-11-11 NOTE — Progress Notes (Signed)
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  On presentation UA was suggestive of UTI.  Chest x-ray was negative for any cardiopulmonary process.  CT of the head with and without contrast showed evidence of vasogenic edema and regional mass effect associated with right to left midline shift up to 9 mm in the absence of any associated intracranial hemorrhage/acute infarct.  She was started on IV Decadron and Rocephin.    Assessment & Plan:   Cerebral edema History of glioblastoma on Temodar Failure to thrive/poor oral intake -Patient has been on oral Temodar and Decadron as an outpatient and follows up with Dr. Mickeal Skinner and was supposed to see him on 11/13/2021 -Presented with worsening oral intake and CT showed worsening cerebral edema. -Currently on Decadron 10 mg IV daily. -Communicated with Dr. Mickeal Skinner via secure chat who recommended palliative care consultation; continue current dose of Decadron and he will be available to see the patient on Monday.  I have communicated the same to the patient/daughter at bedside and discussed the overall poor prognosis.  Patient is clear that she wants to be a DNR.  CODE STATUS changed to DNR.  I have placed a palliative care consultation. -Start regular diet if tolerated.  Continue IV fluids  UTI/acute cystitis: Present on admission -Continue Rocephin  Recently diagnosed bilateral lower extremities -Diagnosed in March 2023 continue Eliquis  Generalized anxiety disorder -Continue as needed Xanax  Hypothyroidism--continue levothyroxine  Physical deconditioning -PT eval   DVT prophylaxis: Eliquis Code Status: DNR Family Communication: Daughter at bedside Disposition  Plan: Status is: Inpatient Remains inpatient appropriate because: Of severity of illness    Consultants: Palliative care.  Communicated via secure chat with Dr. Mickeal Skinner  Procedures: None  Antimicrobials: Rocephin from 11/10/2021 onwards   Subjective: Patient seen and examined at bedside.  Wakes up slightly and answers some questions and nods her head to some questions.  Daughter present at bedside.  Patient feels weak but does not complain of any headache, current nausea or vomiting.  Feels hungry.  Complains of some lower extremity pain intermittently.  Objective: Vitals:   11/10/21 2200 11/11/21 0043 11/11/21 0553 11/11/21 0924  BP: 139/82 (!) 149/86 (!) 153/87 (!) 144/74  Pulse: 82 86 89 69  Resp: '20 16 15 16  '$ Temp:  98.2 F (36.8 C) 98 F (36.7 C) 98.9 F (37.2 C)  TempSrc:  Oral Oral Oral  SpO2: 100% 99% 97% 98%  Weight:      Height:        Intake/Output Summary (Last 24 hours) at 11/11/2021 1134 Last data filed at 11/11/2021 0958 Gross per 24 hour  Intake --  Output 850 ml  Net -850 ml   Filed Weights   11/10/21 1827  Weight: 105 kg    Examination:  General exam: Appears calm and comfortable.  Looks chronically ill and deconditioned.  Currently on room air.  No distress. Respiratory system: Bilateral decreased breath sounds at bases with some scattered crackles Cardiovascular system: S1 & S2 heard, Rate controlled Gastrointestinal system: Abdomen is nondistended, soft and nontender. Normal bowel sounds heard. Extremities: No cyanosis, clubbing; bilateral lower extremity edema present Central nervous system: Wakes up slightly, answers some questions, slow to respond.  Poor historian.  No focal neurological deficits. Moving extremities  Skin: No rashes, lesions or ulcers Psychiatry: Affect is mostly flat.  No signs of agitation.  Data Reviewed: I have personally reviewed following labs and imaging studies  CBC: Recent Labs  Lab 11/10/21 2124 11/11/21 0522   WBC 6.4 7.0  NEUTROABS 4.9 6.4  HGB 10.6* 10.5*  HCT 32.9* 31.7*  MCV 95.6 94.1  PLT 266 161   Basic Metabolic Panel: Recent Labs  Lab 11/10/21 2124 11/11/21 0522  NA 137 138  K 4.1 4.0  CL 104 106  CO2 25 25  GLUCOSE 101* 118*  BUN 21 22  CREATININE 1.15* 0.97  CALCIUM 8.6* 8.9  MG 2.2 2.0   GFR: Estimated Creatinine Clearance: 62 mL/min (by C-G formula based on SCr of 0.97 mg/dL). Liver Function Tests: Recent Labs  Lab 11/10/21 2124 11/11/21 0522  AST 13* 12*  ALT 12 12  ALKPHOS 46 48  BILITOT 0.6 0.5  PROT 6.5 6.4*  ALBUMIN 3.2* 3.1*   No results for input(s): LIPASE, AMYLASE in the last 168 hours. No results for input(s): AMMONIA in the last 168 hours. Coagulation Profile: No results for input(s): INR, PROTIME in the last 168 hours. Cardiac Enzymes: No results for input(s): CKTOTAL, CKMB, CKMBINDEX, TROPONINI in the last 168 hours. BNP (last 3 results) No results for input(s): PROBNP in the last 8760 hours. HbA1C: No results for input(s): HGBA1C in the last 72 hours. CBG: No results for input(s): GLUCAP in the last 168 hours. Lipid Profile: No results for input(s): CHOL, HDL, LDLCALC, TRIG, CHOLHDL, LDLDIRECT in the last 72 hours. Thyroid Function Tests: Recent Labs    11/11/21 0640  TSH 2.339   Anemia Panel: No results for input(s): VITAMINB12, FOLATE, FERRITIN, TIBC, IRON, RETICCTPCT in the last 72 hours. Sepsis Labs: No results for input(s): PROCALCITON, LATICACIDVEN in the last 168 hours.  Recent Results (from the past 240 hour(s))  Urine Culture     Status: Abnormal   Collection Time: 11/02/21  1:25 PM   Specimen: Urine, Clean Catch  Result Value Ref Range Status   Specimen Description   Final    URINE, CLEAN CATCH Performed at [facility], [address] [place], [place] [address]    Special Requests   Final    NONE Performed at [facility], [place] [address], [place], [place]  [address]    Culture (A)  Final    <10,000 COLONIES/mL INSIGNIFICANT GROWTH Performed at [facility] [address], [place], [place] [address]    Report Status 11/03/2021 FINAL  Final         Radiology Studies: DG Chest 2 View  Result Date: 11/10/2021 CLINICAL DATA:  Decreased O2 sats. EXAM: CHEST - 2 VIEW COMPARISON:  Chest x-ray 07/13/2011. FINDINGS: The heart is enlarged, unchanged. There is mild elevation of the right hemidiaphragm. There is no definite focal lung infiltrate, pleural effusion or pneumothorax. No acute fractures are seen. IMPRESSION: 1. Cardiomegaly. 2. No evidence for pneumonia or edema. Electronically Signed   By: Ronney Asters M.D.   On: 11/10/2021 19:38   CT HEAD W & WO CONTRAST (5MM)  Result Date: 11/10/2021 CLINICAL DATA:  Initial evaluation for brain metastases suspected. EXAM: CT HEAD WITHOUT AND WITH CONTRAST TECHNIQUE: Contiguous axial images were obtained from the base of the skull through the vertex without and with intravenous contrast. RADIATION DOSE REDUCTION: This exam was performed according to the departmental dose-optimization program which includes automated exposure control, adjustment of the mA and/or  kV according to patient size and/or use of iterative reconstruction technique. CONTRAST:  65m OMNIPAQUE IOHEXOL 300 MG/ML  SOLN COMPARISON:  Prior MRI from 10/06/2021. FINDINGS: Brain: Postoperative changes from prior right-sided craniotomy for tumor resection again seen. Underlying dural thickening and enhancement, similar to previous. Prominent peripheral enhancement again seen about the resection cavity, similar to previous. Additionally, the overall resection cavity appears increased in size from previous, now measuring up to 4.3 cm (series 4, image 14, previously 3.4 cm when measured in a similar fashion on prior exam. Extensive surrounding vasogenic edema with regional mass effect appears somewhat worsened. Worsened mass effect on the right lateral  ventricle which is largely effaced. Associated right-to-left shift now measures up to 9 mm. No overt ventricular trapping. Basilar cisterns remain patent. No other visible distant lesion or abnormal enhancement visible by CT. No acute intracranial hemorrhage or superimposed large vessel territory infarct. No other extra-axial fluid collection. Vascular: No hyperdense vessel prior to contrast administration. Normal intravascular enhancement seen following contrast administration. Skull: Prior right craniotomy. No acute scalp soft tissue abnormality. Calvarium otherwise intact. Sinuses/Orbits: Right gaze noted. Paranasal sinuses are clear. No mastoid effusion. Other: None. IMPRESSION: 1. Postoperative changes from prior right-sided craniotomy for tumor resection. Prominent peripheral enhancement again seen about the resection cavity, with overall increased size of the cavity as compared to previous. Findings consistent with progressive disease. 2. Worsened surrounding vasogenic edema and regional mass effect. Associated right-to-left midline shift now measures up to 9 mm. No ventricular trapping. Electronically Signed   By: BJeannine BogaM.D.   On: 11/10/2021 23:15        Scheduled Meds:  ALPRAZolam  0.25 mg Oral Once   apixaban  2.5 mg Oral BID   dexamethasone (DECADRON) injection  10 mg Intravenous Q24H   famotidine  20 mg Oral Daily   levothyroxine  50 mcg Oral Daily   Continuous Infusions:  cefTRIAXone (ROCEPHIN)  IV 1 g (11/11/21 1009)   lactated ringers 50 mL/hr at 11/11/21 1000          KAline August MD [facility] 11/11/2021, 11:34 AM

## 2021-11-11 NOTE — H&P (Signed)
History and Physical    PLEASE NOTE THAT DRAGON DICTATION SOFTWARE WAS USED IN THE CONSTRUCTION OF THIS NOTE.   Denise Wright TDV:[id number] DOB: [birth year] DOA: 11/10/2021  PCP: Willey Blade, MD  Patient coming from: home   I have personally briefly reviewed patient's old medical records in [place]  Chief Complaint: Diminished oral intake  HPI: Denise Wright is a 71 y.o. 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   The following history is provided by the patient as well as her daughter, who is present at bedside, in addition to my discussions with the EDP and via chart review.  Daughter conveys that over the last 3 to 4 days, the patient has exhibited evidence of diminished oral intake, with the patient giving consistently basis of corresponding decline in appetite, in the absence of any associated nausea or vomiting.  No associated any diarrhea or abdominal pain.  Patient denies any headache at this time.  No recent chest pain or shortness of breath.  The patient has a history of glioblastoma for which she follows with Dr. Mickeal Skinner Of neuro-oncology and whom she is supposed to follow-up with on Monday, 11/13/2021.  She is already on Decadron 1 mg p.o. daily as an outpatient.     ED Course:  Labs were notable for the following: CMP notable for the following: Creatinine 1.15 compared to most recent prior creatinine data point of 1.32.  CBC notable for episode count 6400.  Urinalysis notable for 11-20 white blood cells, no squamous epithelial cells.   Imaging and additional notable ED work-up: Chest x-ray shows no evidence of acute cardiopulmonary process, including no evidence of infiltrate, edema,  effusion, or pneumothorax.  CT head with and without contrast show evidence of vasogenic edema and regional mass effect associated with right to left midline shift measuring up to 9 mm in the absence of any associated evidence of intracranial hemorrhage or acute infarct.  While in the ED, the following were administered: Decadron 10 mg IV x1.  Subsequently, the patient was admitted further evaluation management of presenting worsening of cerebral edema.    Review of Systems: As per HPI otherwise 10 point review of systems negative.   Past Medical History:  Diagnosis Date   Glioblastoma ([place])    Hyperlipidemia    Hypertension    Pneumonia    Thyroid disease     Past Surgical History:  Procedure Laterality Date   ABDOMINAL HYSTERECTOMY  0626   APPLICATION OF CRANIAL NAVIGATION Right 07/06/2021   Procedure: APPLICATION OF CRANIAL NAVIGATION;  Surgeon: Dawley, Theodoro Doing, DO;  Location: [facility];  Service: Neurosurgery;  Laterality: Right;   CRANIOTOMY Right 07/06/2021   Procedure: CRANIOTOMY TUMOR EXCISION;  Surgeon: Karsten Ro, DO;  Location: [facility];  Service: Neurosurgery;  Laterality: Right;   TONSILLECTOMY  1965   TOOTH EXTRACTION      Social History:  reports that she has never smoked. She has never used smokeless tobacco. She reports that she does not drink alcohol and does not use drugs.   Allergies  Allergen Reactions   Fexofenadine Hives   Penicillins Itching and Other (See Comments)    Pulling sensation in body    Family History  Problem Relation Age of Onset  Diabetes Mother    Hypertension Mother    Hypertension Father    Leukemia Father    Stroke Father    Cancer Brother    Pneumonia Brother     Family history reviewed and not pertinent    Prior to Admission medications   Medication Sig Start Date End Date Taking? Authorizing Provider  apixaban (ELIQUIS) 2.5 MG TABS tablet Take 2.5 mg by mouth 2 (two) times daily.   Yes [provider]  b complex  vitamins capsule Take 1 capsule by mouth daily.   Yes [provider]  cetirizine (ZYRTEC) 10 MG tablet Take 10 mg by mouth daily.   Yes [provider]  cholecalciferol (VITAMIN D) 1000 UNITS tablet Take 2,000 Units by mouth daily.   Yes [provider]  dexamethasone (DECADRON) 1 MG tablet Take 1 tablet (1 mg total) by mouth daily. 10/23/21  Yes Vaslow, Acey Lav, MD  docusate sodium (COLACE) 100 MG capsule Take 1 capsule (100 mg total) by mouth 2 (two) times daily. 07/21/21  Yes Angiulli, Lavon Paganini, PA-C  famotidine (PEPCID) 20 MG tablet Take 1 tablet (20 mg total) by mouth daily. 08/31/21  Yes Vaslow, Acey Lav, MD  levothyroxine (SYNTHROID) 50 MCG tablet Take 1 tablet (50 mcg total) by mouth daily. 07/21/21  Yes Angiulli, Lavon Paganini, PA-C  Multiple Vitamins-Minerals (MULTIVITAMIN WITH MINERALS) tablet Take 1 tablet by mouth daily.   Yes [provider]  acetaminophen (TYLENOL) 325 MG tablet Take 2 tablets (650 mg total) by mouth every 6 (six) hours as needed for mild pain (or Fever >/= 101). Patient not taking: Reported on 11/10/2021 07/21/21   Angiulli, Lavon Paganini, PA-C  ALPRAZolam Duanne Moron) 0.25 MG tablet Take 1 tablet (0.25 mg total) by mouth 2 (two) times daily as needed for anxiety. Patient not taking: Reported on 11/10/2021 10/18/21   Meredith Staggers, MD  APIXABAN Arne Cleveland) VTE STARTER PACK ('10MG'$  AND '5MG'$ ) Take as directed on package: start with two-'5mg'$  tablets twice daily for 7 days. On day 8, switch to one-'5mg'$  tablet twice daily. Patient not taking: Reported on 11/10/2021 10/25/21   Ventura Sellers, MD  ondansetron (ZOFRAN) 8 MG tablet Take 1 tablet by mouth 2 times daily as needed (nausea and vomiting). May take 30-60 minutes prior to Temodar administration if nausea/vomiting occurs. Patient not taking: Reported on 11/10/2021 10/23/21   Ventura Sellers, MD  temozolomide (TEMODAR) 140 MG capsule Take 1 capsule (140 mg total) by mouth daily. May take on an empty stomach  to decrease nausea & vomiting. Patient not taking: Reported on 11/10/2021 10/23/21   Ventura Sellers, MD  temozolomide (TEMODAR) 180 MG capsule Take 1 capsule (180 mg total) by mouth daily. May take on an empty stomach to decrease nausea & vomiting. Patient not taking: Reported on 11/10/2021 10/23/21   Ventura Sellers, MD     Objective    Physical Exam: Vitals:   11/10/21 2100 11/10/21 2200 11/11/21 0043 11/11/21 0553  BP: (!) 156/86 139/82 (!) 149/86 (!) 153/87  Pulse: 85 82 86 89  Resp: '19 20 16 15  '$ Temp:   98.2 F (36.8 C) 98 F (36.7 C)  TempSrc:   Oral Oral  SpO2: 99% 100% 99% 97%  Weight:      Height:        General: appears to be stated age; alert, oriented Skin: warm, dry, no rash Head:  AT/[place] Mouth:  Oral mucosa membranes appear moist, normal dentition Neck: supple; trachea midline  Heart:  RRR; did not appreciate any M/R/G Lungs: CTAB, did not appreciate any wheezes, rales, or rhonchi Abdomen: + BS; soft, ND, NT Vascular: 2+ pedal pulses b/l; 2+ radial pulses b/l Extremities: no peripheral edema, no muscle wasting Neuro: strength and sensation intact in upper and lower extremities b/l     Labs on Admission: I have personally reviewed following labs and imaging studies  CBC: Recent Labs  Lab 11/10/21 2124 11/11/21 0522  WBC 6.4 7.0  NEUTROABS 4.9 6.4  HGB 10.6* 10.5*  HCT 32.9* 31.7*  MCV 95.6 94.1  PLT 266 932   Basic Metabolic Panel: Recent Labs  Lab 11/10/21 2124  NA 137  K 4.1  CL 104  CO2 25  GLUCOSE 101*  BUN 21  CREATININE 1.15*  CALCIUM 8.6*  MG 2.2   GFR: Estimated Creatinine Clearance: 52.3 mL/min (A) (by C-G formula based on SCr of 1.15 mg/dL (H)). Liver Function Tests: Recent Labs  Lab 11/10/21 2124  AST 13*  ALT 12  ALKPHOS 46  BILITOT 0.6  PROT 6.5  ALBUMIN 3.2*   No results for input(s): LIPASE, AMYLASE in the last 168 hours. No results for input(s): AMMONIA in the last 168 hours. Coagulation Profile: No results  for input(s): INR, PROTIME in the last 168 hours. Cardiac Enzymes: No results for input(s): CKTOTAL, CKMB, CKMBINDEX, TROPONINI in the last 168 hours. BNP (last 3 results) No results for input(s): PROBNP in the last 8760 hours. HbA1C: No results for input(s): HGBA1C in the last 72 hours. CBG: No results for input(s): GLUCAP in the last 168 hours. Lipid Profile: No results for input(s): CHOL, HDL, LDLCALC, TRIG, CHOLHDL, LDLDIRECT in the last 72 hours. Thyroid Function Tests: No results for input(s): TSH, T4TOTAL, FREET4, T3FREE, THYROIDAB in the last 72 hours. Anemia Panel: No results for input(s): VITAMINB12, FOLATE, FERRITIN, TIBC, IRON, RETICCTPCT in the last 72 hours. Urine analysis:    Component Value Date/Time   COLORURINE YELLOW 11/10/2021 2105   APPEARANCEUR CLEAR 11/10/2021 2105   LABSPEC 1.015 11/10/2021 2105   PHURINE 6.0 11/10/2021 2105   GLUCOSEU NEGATIVE 11/10/2021 2105   HGBUR SMALL (A) 11/10/2021 2105   BILIRUBINUR NEGATIVE 11/10/2021 2105   [place] NEGATIVE 11/10/2021 2105   PROTEINUR NEGATIVE 11/10/2021 2105   NITRITE NEGATIVE 11/10/2021 2105   LEUKOCYTESUR NEGATIVE 11/10/2021 2105    Radiological Exams on Admission: DG Chest 2 View  Result Date: 11/10/2021 CLINICAL DATA:  Decreased O2 sats. EXAM: CHEST - 2 VIEW COMPARISON:  Chest x-ray 07/13/2011. FINDINGS: The heart is enlarged, unchanged. There is mild elevation of the right hemidiaphragm. There is no definite focal lung infiltrate, pleural effusion or pneumothorax. No acute fractures are seen. IMPRESSION: 1. Cardiomegaly. 2. No evidence for pneumonia or edema. Electronically Signed   By: Ronney Asters M.D.   On: 11/10/2021 19:38   CT HEAD W & WO CONTRAST (5MM)  Result Date: 11/10/2021 CLINICAL DATA:  Initial evaluation for brain metastases suspected. EXAM: CT HEAD WITHOUT AND WITH CONTRAST TECHNIQUE: Contiguous axial images were obtained from the base of the skull through the vertex without and with  intravenous contrast. RADIATION DOSE REDUCTION: This exam was performed according to the departmental dose-optimization program which includes automated exposure control, adjustment of the mA and/or kV according to patient size and/or use of iterative reconstruction technique. CONTRAST:  33m OMNIPAQUE IOHEXOL 300 MG/ML  SOLN COMPARISON:  Prior MRI from 10/06/2021. FINDINGS: Brain: Postoperative changes from prior right-sided craniotomy for tumor resection again seen. Underlying dural thickening and  enhancement, similar to previous. Prominent peripheral enhancement again seen about the resection cavity, similar to previous. Additionally, the overall resection cavity appears increased in size from previous, now measuring up to 4.3 cm (series 4, image 14, previously 3.4 cm when measured in a similar fashion on prior exam. Extensive surrounding vasogenic edema with regional mass effect appears somewhat worsened. Worsened mass effect on the right lateral ventricle which is largely effaced. Associated right-to-left shift now measures up to 9 mm. No overt ventricular trapping. Basilar cisterns remain patent. No other visible distant lesion or abnormal enhancement visible by CT. No acute intracranial hemorrhage or superimposed large vessel territory infarct. No other extra-axial fluid collection. Vascular: No hyperdense vessel prior to contrast administration. Normal intravascular enhancement seen following contrast administration. Skull: Prior right craniotomy. No acute scalp soft tissue abnormality. Calvarium otherwise intact. Sinuses/Orbits: Right gaze noted. Paranasal sinuses are clear. No mastoid effusion. Other: None. IMPRESSION: 1. Postoperative changes from prior right-sided craniotomy for tumor resection. Prominent peripheral enhancement again seen about the resection cavity, with overall increased size of the cavity as compared to previous. Findings consistent with progressive disease. 2. Worsened surrounding  vasogenic edema and regional mass effect. Associated right-to-left midline shift now measures up to 9 mm. No ventricular trapping. Electronically Signed   By: Jeannine Boga M.D.   On: 11/10/2021 23:15      Assessment/Plan    Principal Problem:   Cerebral edema (HCC) Active Problems:   Hypothyroidism   Generalized anxiety disorder   Acute cystitis   GERD (gastroesophageal reflux disease)   History of DVT (deep vein thrombosis)      #) Cerebral edema: In the context of known history of glioblastoma for which she follows with Dr. Mickeal Skinner Of neuro-oncology, this evening's CT head shows evidence of worsening vasogenic edema and regional mass effect associated with right to left midline shift measuring up to 9 mm.  No known left-sided hemiparesis, but otherwise presentation is not associate with any overt acute focal neurologic deficits.  She is already on Decadron 1 mg p.o. daily as an outpatient, and is scheduled to follow-up with Dr. Mickeal Skinner  On Monday, 11/13/2021.  Has received Decadron 10 mg IV x1 dose in the ED this evening.  Continue IV Decadron.  I have added Dr. Mickeal Skinner  To the patient's treatment team list, as he is on-call as of 7 AM on 11/11/2021.  Every 4 hours neurochecks monitor on telemetry.          #) Acute cystitis: In the setting of some recent generalized weakness, presenting urinalysis demonstrates 11-20 white blood cells in the absence of any squamous epithelial cells to suggest a contaminated specimen.  Relative to most recent prior urinalysis, today's UA demonstrates interval increase in presence of white blood cells.  In the setting of her generalized weakness, with likely significant additional contribution from her glioblastoma and cerebral edema, will consider these results and clinical presentation to be suspicious for acute cystitis in the absence of any SIRS criteria to meet sepsis at this time.  Consequently, we will initiate IV antibiotics, as  below.  Plan: add-on Urine culture and start Rocephin.  Repeat CBC with differential in the morning.         #) acquired hypothyroidism: documented h/o such, on Synthroid as outpatient.   Plan: cont home Synthroid.  In setting of diminished appetite, will also check TSH level.           #) Generalized anxiety disorder: On as needed Xanax as an  outpatient.  Plan: Continue home as needed Xanax.          #) GERD: documented h/o such; on Pepcid as outpatient.   Plan: continue home Pepcid.         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  Plan: Continue Eliquis.       DVT prophylaxis: Continue home Eliquis Code Status: Full code Family Communication: I discussed the patient's case with her daughter, who is present at bedside Disposition Plan: Per Rounding Team Consults called: Dr. Mickeal Skinner Of neurooncology has been added to the patient's treatment team list, as above;  Admission status: Inpatient    PLEASE NOTE THAT DRAGON DICTATION SOFTWARE WAS USED IN THE CONSTRUCTION OF THIS NOTE.   Wahak Hotrontk DO [facility] [place]   11/11/2021, 6:24 AM

## 2021-11-11 NOTE — Assessment & Plan Note (Signed)
 #)   acquired hypothyroidism: documented h/o such, on Synthroid as outpatient.   Plan: cont home Synthroid.  In setting of diminished appetite, will also check TSH level.

## 2021-11-11 NOTE — Assessment & Plan Note (Signed)
 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  Plan: Continue Eliquis.

## 2021-11-11 NOTE — Assessment & Plan Note (Signed)
#)   Cerebral edema: In the context of known history of glioblastoma for which she follows with Dr. Mickeal Skinner Of neuro-oncology, this evening's CT head shows evidence of worsening vasogenic edema and regional mass effect associated with right to left midline shift measuring up to 9 mm.  No known left-sided hemiparesis, but otherwise presentation is not associate with any overt acute focal neurologic deficits.  She is already on Decadron 1 mg p.o. daily as an outpatient, and is scheduled to follow-up with Dr. Mickeal Skinner  On Monday, 11/13/2021.  Has received Decadron 10 mg IV x1 dose in the ED this evening.  Continue IV Decadron.  I have added Dr. Mickeal Skinner  To the patient's treatment team list, as he is on-call as of 7 AM on 11/11/2021.  Every 4 hours neurochecks monitor on telemetry.

## 2021-11-11 NOTE — Assessment & Plan Note (Signed)
  #)   Acute cystitis: In the setting of some recent generalized weakness, presenting urinalysis demonstrates 11-20 white blood cells in the absence of any squamous epithelial cells to suggest a contaminated specimen.  Relative to most recent prior urinalysis, today's UA demonstrates interval increase in presence of white blood cells.  In the setting of her generalized weakness, with likely significant additional contribution from her glioblastoma and cerebral edema, will consider these results and clinical presentation to be suspicious for acute cystitis in the absence of any SIRS criteria to meet sepsis at this time.  Consequently, we will initiate IV antibiotics, as below.  Plan: add-on Urine culture and start Rocephin.  Repeat CBC with differential in the morning.

## 2021-11-12 DIAGNOSIS — Z515 Encounter for palliative care: Secondary | ICD-10-CM

## 2021-11-12 DIAGNOSIS — N3000 Acute cystitis without hematuria: Secondary | ICD-10-CM | POA: Diagnosis not present

## 2021-11-12 DIAGNOSIS — Z7189 Other specified counseling: Secondary | ICD-10-CM

## 2021-11-12 DIAGNOSIS — K219 Gastro-esophageal reflux disease without esophagitis: Secondary | ICD-10-CM | POA: Diagnosis not present

## 2021-11-12 DIAGNOSIS — C719 Malignant neoplasm of brain, unspecified: Secondary | ICD-10-CM

## 2021-11-12 DIAGNOSIS — F411 Generalized anxiety disorder: Secondary | ICD-10-CM | POA: Diagnosis not present

## 2021-11-12 DIAGNOSIS — G936 Cerebral edema: Secondary | ICD-10-CM | POA: Diagnosis not present

## 2021-11-12 LAB — URINE CULTURE

## 2021-11-12 MED ORDER — BISACODYL 5 MG PO TBEC
10.0000 mg | DELAYED_RELEASE_TABLET | Freq: Every day | ORAL | Status: DC | PRN
Start: 2021-11-12 — End: 2021-11-18
  Administered 2021-11-12 – 2021-11-15 (×2): 10 mg via ORAL
  Filled 2021-11-12 (×2): qty 2

## 2021-11-12 NOTE — Progress Notes (Signed)
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  On presentation UA was suggestive of UTI.  Chest x-ray was negative for any cardiopulmonary process.  CT of the head with and without contrast showed evidence of vasogenic edema and regional mass effect associated with right to left midline shift up to 9 mm in the absence of any associated intracranial hemorrhage/acute infarct.  She was started on IV Decadron and Rocephin.    Assessment & Plan:   Cerebral edema History of glioblastoma on Temodar Failure to thrive/poor oral intake -Patient has been on oral Temodar and Decadron as an outpatient and follows up with Dr. Mickeal Skinner and was supposed to see him on 11/13/2021 -Presented with worsening oral intake and CT showed worsening cerebral edema. -Currently on Decadron 10 mg IV daily. -Communicated with Dr. Mickeal Skinner on 11/11/2021 via secure chat who recommended palliative care consultation; continue current dose of Decadron and he will be available to see the patient on Monday.  I have communicated the same  with the patient/daughter at bedside and discussed the overall poor prognosis.  Patient is clear that she wants to be a DNR.  CODE STATUS changed to DNR.  -Palliative care consultation pending. -Continue regular diet if tolerated.  Continue IV fluids  UTI/acute cystitis: Present on admission -Continue Rocephin.  Follow cultures  Recently diagnosed bilateral lower extremities -Diagnosed in March 2023 continue Eliquis  Generalized anxiety disorder -Continue as needed Xanax  Constipation -Continue scheduled stool softeners and laxatives as needed  Hypothyroidism--continue levothyroxine  Physical deconditioning -PT  eval   DVT prophylaxis: Eliquis Code Status: DNR Family Communication: Daughter at bedside Disposition Plan: Status is: Inpatient Remains inpatient appropriate because: Of severity of illness    Consultants: Palliative care.  Communicated via secure chat with Dr. Mickeal Skinner  Procedures: None  Antimicrobials: Rocephin from 11/10/2021 onwards   Subjective: Patient seen and examined at bedside.  Poor historian.  Answers some questions.  Complains of constipation.  No fever, worsening headache, vomiting or chest pain reported.  Objective: Vitals:   11/11/21 1221 11/11/21 2023 11/12/21 0500 11/12/21 0512  BP: 133/62 118/65  (!) 144/81  Pulse: 78 90  75  Resp: '17 17  16  '$ Temp: 99 F (37.2 C) 97.9 F (36.6 C)  97.6 F (36.4 C)  TempSrc: Oral Oral  Oral  SpO2: 99% 98%  99%  Weight:   104.4 kg   Height:        Intake/Output Summary (Last 24 hours) at 11/12/2021 0752 Last data filed at 11/12/2021 0320 Gross per 24 hour  Intake 1350 ml  Output 1101 ml  Net 249 ml    Filed Weights   11/10/21 1827 11/12/21 0500  Weight: 105 kg 104.4 kg    Examination:  General: On room air.  No distress.  Looks chronically ill and deconditioned. ENT/neck: No thyromegaly.  JVD is not elevated  respiratory: Decreased breath sounds at bases bilaterally with some crackles; no wheezing CVS: S1-S2 heard, rate controlled Abdominal: Soft, nontender, slightly distended; no organomegaly, normal bowel sounds are heard Extremities: Trace lower extremity edema; no cyanosis  CNS: Awake.  Slow to respond.  Poor historian.  No focal neurologic deficit.  Moves extremities Lymph: No obvious lymphadenopathy Skin: No obvious ecchymosis/lesions  psych: Mostly flat affect.  Currently not agitated.   Musculoskeletal: No obvious joint swelling/deformity   Data Reviewed: I have personally reviewed following labs and imaging studies  CBC: Recent Labs  Lab 11/10/21 2124 11/11/21 0522  WBC 6.4 7.0  NEUTROABS  4.9 6.4  HGB 10.6* 10.5*  HCT 32.9* 31.7*  MCV 95.6 94.1  PLT 266 762    Basic Metabolic Panel: Recent Labs  Lab 11/10/21 2124 11/11/21 0522  NA 137 138  K 4.1 4.0  CL 104 106  CO2 25 25  GLUCOSE 101* 118*  BUN 21 22  CREATININE 1.15* 0.97  CALCIUM 8.6* 8.9  MG 2.2 2.0    GFR: Estimated Creatinine Clearance: 61.8 mL/min (by C-G formula based on SCr of 0.97 mg/dL). Liver Function Tests: Recent Labs  Lab 11/10/21 2124 11/11/21 0522  AST 13* 12*  ALT 12 12  ALKPHOS 46 48  BILITOT 0.6 0.5  PROT 6.5 6.4*  ALBUMIN 3.2* 3.1*    No results for input(s): LIPASE, AMYLASE in the last 168 hours. No results for input(s): AMMONIA in the last 168 hours. Coagulation Profile: No results for input(s): INR, PROTIME in the last 168 hours. Cardiac Enzymes: No results for input(s): CKTOTAL, CKMB, CKMBINDEX, TROPONINI in the last 168 hours. BNP (last 3 results) No results for input(s): PROBNP in the last 8760 hours. HbA1C: No results for input(s): HGBA1C in the last 72 hours. CBG: No results for input(s): GLUCAP in the last 168 hours. Lipid Profile: No results for input(s): CHOL, HDL, LDLCALC, TRIG, CHOLHDL, LDLDIRECT in the last 72 hours. Thyroid Function Tests: Recent Labs    11/11/21 0640  TSH 2.339    Anemia Panel: No results for input(s): VITAMINB12, FOLATE, FERRITIN, TIBC, IRON, RETICCTPCT in the last 72 hours. Sepsis Labs: No results for input(s): PROCALCITON, LATICACIDVEN in the last 168 hours.  Recent Results (from the past 240 hour(s))  Urine Culture     Status: Abnormal   Collection Time: 11/02/21  1:25 PM   Specimen: Urine, Clean Catch  Result Value Ref Range Status   Specimen Description   Final    URINE, CLEAN CATCH Performed at [facility], [address] [place], [place] [address]    Special Requests   Final    NONE Performed at [facility], [place] [address] [place], [place] [address]    Culture  (A)  Final    <10,000 COLONIES/mL INSIGNIFICANT GROWTH Performed at [place] [address], [place], [place] [address]    Report Status 11/03/2021 FINAL  Final          Radiology Studies: DG Chest 2 View  Result Date: 11/10/2021 CLINICAL DATA:  Decreased O2 sats. EXAM: CHEST - 2 VIEW COMPARISON:  Chest x-ray 07/13/2011. FINDINGS: The heart is enlarged, unchanged. There is mild elevation of the right hemidiaphragm. There is no definite focal lung infiltrate, pleural effusion or pneumothorax. No acute fractures are seen. IMPRESSION: 1. Cardiomegaly. 2. No evidence for pneumonia or edema. Electronically Signed   By: Ronney Asters M.D.   On: 11/10/2021 19:38   CT HEAD W & WO CONTRAST (5MM)  Result Date: 11/10/2021 CLINICAL DATA:  Initial evaluation for brain metastases suspected. EXAM: CT HEAD WITHOUT AND WITH CONTRAST TECHNIQUE: Contiguous axial images were obtained from the base of the skull through the vertex without and with intravenous contrast. RADIATION DOSE REDUCTION: This exam was performed according to the departmental dose-optimization program which includes automated exposure control, adjustment of the mA and/or  kV according to patient size and/or use of iterative reconstruction technique. CONTRAST:  78m OMNIPAQUE IOHEXOL 300 MG/ML  SOLN COMPARISON:  Prior MRI from 10/06/2021. FINDINGS: Brain: Postoperative changes from prior right-sided craniotomy for tumor resection again seen. Underlying dural thickening and enhancement, similar to previous. Prominent peripheral enhancement again seen about the resection cavity, similar to previous. Additionally, the overall resection cavity appears increased in size from previous, now measuring up to 4.3 cm (series 4, image 14, previously 3.4 cm when measured in a similar fashion on prior exam. Extensive surrounding vasogenic edema with regional mass effect appears somewhat worsened. Worsened mass effect on the right lateral ventricle which  is largely effaced. Associated right-to-left shift now measures up to 9 mm. No overt ventricular trapping. Basilar cisterns remain patent. No other visible distant lesion or abnormal enhancement visible by CT. No acute intracranial hemorrhage or superimposed large vessel territory infarct. No other extra-axial fluid collection. Vascular: No hyperdense vessel prior to contrast administration. Normal intravascular enhancement seen following contrast administration. Skull: Prior right craniotomy. No acute scalp soft tissue abnormality. Calvarium otherwise intact. Sinuses/Orbits: Right gaze noted. Paranasal sinuses are clear. No mastoid effusion. Other: None. IMPRESSION: 1. Postoperative changes from prior right-sided craniotomy for tumor resection. Prominent peripheral enhancement again seen about the resection cavity, with overall increased size of the cavity as compared to previous. Findings consistent with progressive disease. 2. Worsened surrounding vasogenic edema and regional mass effect. Associated right-to-left midline shift now measures up to 9 mm. No ventricular trapping. Electronically Signed   By: BJeannine BogaM.D.   On: 11/10/2021 23:15        Scheduled Meds:  ALPRAZolam  0.25 mg Oral Once   apixaban  5 mg Oral BID   dexamethasone (DECADRON) injection  10 mg Intravenous Q24H   famotidine  20 mg Oral Daily   levothyroxine  50 mcg Oral Daily   Continuous Infusions:  cefTRIAXone (ROCEPHIN)  IV 1 g (11/11/21 1009)   lactated ringers 50 mL/hr at 11/11/21 2310          KAline August MD [facility] 11/12/2021, 7:52 AM

## 2021-11-13 ENCOUNTER — Inpatient Hospital Stay: Payer: BC Managed Care – PPO | Admitting: Nurse Practitioner

## 2021-11-13 ENCOUNTER — Inpatient Hospital Stay: Payer: BC Managed Care – PPO

## 2021-11-13 ENCOUNTER — Inpatient Hospital Stay: Payer: BC Managed Care – PPO | Admitting: Internal Medicine

## 2021-11-13 ENCOUNTER — Telehealth: Payer: Self-pay

## 2021-11-13 DIAGNOSIS — I10 Essential (primary) hypertension: Secondary | ICD-10-CM | POA: Diagnosis not present

## 2021-11-13 DIAGNOSIS — N3000 Acute cystitis without hematuria: Secondary | ICD-10-CM | POA: Diagnosis not present

## 2021-11-13 DIAGNOSIS — Z809 Family history of malignant neoplasm, unspecified: Secondary | ICD-10-CM | POA: Diagnosis not present

## 2021-11-13 DIAGNOSIS — Z86718 Personal history of other venous thrombosis and embolism: Secondary | ICD-10-CM | POA: Diagnosis not present

## 2021-11-13 DIAGNOSIS — Z90710 Acquired absence of both cervix and uterus: Secondary | ICD-10-CM | POA: Diagnosis not present

## 2021-11-13 DIAGNOSIS — C712 Malignant neoplasm of temporal lobe: Secondary | ICD-10-CM

## 2021-11-13 DIAGNOSIS — F411 Generalized anxiety disorder: Secondary | ICD-10-CM | POA: Diagnosis not present

## 2021-11-13 DIAGNOSIS — G936 Cerebral edema: Secondary | ICD-10-CM | POA: Diagnosis not present

## 2021-11-13 NOTE — Progress Notes (Signed)
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  On presentation UA was suggestive of UTI.  Chest x-ray was negative for any cardiopulmonary process.  CT of the head with and without contrast showed evidence of vasogenic edema and regional mass effect associated with right to left midline shift up to 9 mm in the absence of any associated intracranial hemorrhage/acute infarct.  She was started on IV Decadron and Rocephin.    Assessment & Plan:   Cerebral edema History of glioblastoma on Temodar Failure to thrive/poor oral intake -Patient has been on oral Temodar and Decadron as an outpatient and follows up with Dr. Mickeal Skinner and was supposed to see him on 11/13/2021 -Presented with worsening oral intake and CT showed worsening cerebral edema. -Currently on Decadron 10 mg IV daily. -Communicated with Dr. Mickeal Skinner on 11/11/2021 via secure chat who recommended palliative care consultation; continue current dose of Decadron and he will be available to see the patient on Monday.  I have communicated the same  with the patient/daughter at bedside and discussed the overall poor prognosis.  Patient is clear that she wants to be a DNR.  CODE STATUS changed to DNR.  -Palliative care consultation pending. -Continue regular diet if tolerated.  Continue IV fluids  UTI/acute cystitis: Present on admission -Continue Rocephin.  Urine cultures grew multiple species  Recently diagnosed bilateral lower extremities -Diagnosed in March 2023 continue Eliquis  Generalized anxiety disorder -Continue as needed Xanax  Constipation -Continue scheduled stool softeners and laxatives as needed  Hypothyroidism--continue levothyroxine  Physical  deconditioning -PT eval   DVT prophylaxis: Eliquis Code Status: DNR Family Communication: Daughter and sister at bedside Disposition Plan: Status is: Inpatient Remains inpatient appropriate because: Of severity of illness    Consultants: Palliative care.  Communicated via secure chat with Dr. Mickeal Skinner  Procedures: None  Antimicrobials: Rocephin from 11/10/2021 onwards   Subjective: Patient seen and examined at bedside.  Poor historian.  No seizures, vomiting, fever reported. Had bowel movement yesterday Objective: Vitals:   11/12/21 1432 11/12/21 2046 11/13/21 0429 11/13/21 0458  BP: (!) 147/64 121/71 129/74   Pulse: 71 89 81   Resp: '12 19 20   '$ Temp: 98.6 F (37 C) 99.6 F (37.6 C) 97.8 F (36.6 C)   TempSrc: Oral Oral Oral   SpO2: 100% 99% 100%   Weight:    101.4 kg  Height:        Intake/Output Summary (Last 24 hours) at 11/13/2021 0750 Last data filed at 11/13/2021 0421 Gross per 24 hour  Intake 1759.67 ml  Output 400 ml  Net 1359.67 ml    Filed Weights   11/10/21 1827 11/12/21 0500 11/13/21 0458  Weight: 105 kg 104.4 kg 101.4 kg    Examination:  General: Moderate distress.  Still on room air.  Looks chronically ill and deconditioned. ENT/neck: No JVD elevation.  No palpable neck masses  respiratory: Bilateral decreased breath sounds at bases with scattered crackles  CVS: Rate is currently controlled; S1 and S2 are heard  abdominal: Soft, nontender, distended mildly; no organomegaly, bowel sounds heard  extremities: No clubbing; mild lower extremity edema present CNS: Alert; still extremely slow to respond.  Poor historian.  No focal neurologic deficit.  Moving extremities Lymph: No palpable cervical lymphadenopathy  skin:  No obvious rashes/petechiae psych: Not showing signs of agitation.  Affect is extremely flat  musculoskeletal: No obvious joint swelling/deformity   Data Reviewed: I have personally reviewed following labs and imaging  studies  CBC: Recent Labs  Lab 11/10/21 2124 11/11/21 0522  WBC 6.4 7.0  NEUTROABS 4.9 6.4  HGB 10.6* 10.5*  HCT 32.9* 31.7*  MCV 95.6 94.1  PLT 266 782    Basic Metabolic Panel: Recent Labs  Lab 11/10/21 2124 11/11/21 0522  NA 137 138  K 4.1 4.0  CL 104 106  CO2 25 25  GLUCOSE 101* 118*  BUN 21 22  CREATININE 1.15* 0.97  CALCIUM 8.6* 8.9  MG 2.2 2.0    GFR: Estimated Creatinine Clearance: 60.7 mL/min (by C-G formula based on SCr of 0.97 mg/dL). Liver Function Tests: Recent Labs  Lab 11/10/21 2124 11/11/21 0522  AST 13* 12*  ALT 12 12  ALKPHOS 46 48  BILITOT 0.6 0.5  PROT 6.5 6.4*  ALBUMIN 3.2* 3.1*    No results for input(s): LIPASE, AMYLASE in the last 168 hours. No results for input(s): AMMONIA in the last 168 hours. Coagulation Profile: No results for input(s): INR, PROTIME in the last 168 hours. Cardiac Enzymes: No results for input(s): CKTOTAL, CKMB, CKMBINDEX, TROPONINI in the last 168 hours. BNP (last 3 results) No results for input(s): PROBNP in the last 8760 hours. HbA1C: No results for input(s): HGBA1C in the last 72 hours. CBG: No results for input(s): GLUCAP in the last 168 hours. Lipid Profile: No results for input(s): CHOL, HDL, LDLCALC, TRIG, CHOLHDL, LDLDIRECT in the last 72 hours. Thyroid Function Tests: Recent Labs    11/11/21 0640  TSH 2.339    Anemia Panel: No results for input(s): VITAMINB12, FOLATE, FERRITIN, TIBC, IRON, RETICCTPCT in the last 72 hours. Sepsis Labs: No results for input(s): PROCALCITON, LATICACIDVEN in the last 168 hours.  Recent Results (from the past 240 hour(s))  Urine Culture     Status: Abnormal   Collection Time: 11/09/21  7:22 AM   Specimen: Urine, Clean Catch  Result Value Ref Range Status   Specimen Description   Final    URINE, CLEAN CATCH Performed at [facility], [place] [address], [place], [place] [address]    Special Requests   Final    NONE Performed at [facility], [place], [place] [address] [place], [place] [address]    Culture MULTIPLE SPECIES PRESENT, SUGGEST RECOLLECTION (A)  Final   Report Status 11/12/2021 FINAL  Final          Radiology Studies: No results found.      Scheduled Meds:  ALPRAZolam  0.25 mg Oral Once   apixaban  5 mg Oral BID   dexamethasone (DECADRON) injection  10 mg Intravenous Q24H   famotidine  20 mg Oral Daily   levothyroxine  50 mcg Oral Daily   Continuous Infusions:  cefTRIAXone (ROCEPHIN)  IV 1 g (11/12/21 0802)   lactated ringers 50 mL/hr at 11/12/21 1945          Willetta York [name], MD [facility] 11/13/2021, 7:50 AM

## 2021-11-13 NOTE — Telephone Encounter (Signed)
Pending Palliative Care patient currently admitted to [facility] since 6/2. Attempted to contact patient's [name] to schedule a Palliative Care consult appointment. No answer left a message to return call.

## 2021-11-13 NOTE — Progress Notes (Signed)
[facility] [id number] [employer] [facility]) Hospital Liaison note:  This is a pending outpatient-based Palliative Care patient. Will continue to follow for disposition.  Please call with any outpatient palliative questions or concerns.  Thank you, Lorelee Market, LPN [facility] Liaison [phone]

## 2021-11-13 NOTE — Plan of Care (Signed)
  Problem: Clinical Measurements: Goal: Will remain free from infection Outcome: Progressing Goal: Diagnostic test results will improve Outcome: Progressing   Problem: Nutrition: Goal: Adequate nutrition will be maintained Outcome: Progressing   Problem: Coping: Goal: Level of anxiety will decrease Outcome: Progressing   Problem: Pain Managment: Goal: General experience of comfort will improve Outcome: Progressing   Problem: Safety: Goal: Ability to remain free from injury will improve Outcome: Progressing   

## 2021-11-13 NOTE — Consult Note (Signed)
[facility] Consult Note  Patient Care Team: Willey Blade, MD as PCP - General (Internal Medicine)  CHIEF COMPLAINTS/PURPOSE OF CONSULTATION:  Glioblastoma  HISTORY OF PRESENTING ILLNESS:  Denise Wright 71 y.o. female presented to medical attention with continued decline in functional status at home.  Since being evaluated in the clinic 2 weeks ago, she lost the ability to walk even with help.  Family noted increased confusion and memory issues.  In addition, she began sleeping most of the day.  Workup demonstrated UTI, as well as increased brain inflammation.  She has been started on higher dose of decadron, '10mg'$  daily, but not with significant improvement   MEDICAL HISTORY:  Past Medical History:  Diagnosis Date   Glioblastoma ([place])    Hyperlipidemia    Hypertension    Pneumonia    Thyroid disease     SURGICAL HISTORY: Past Surgical History:  Procedure Laterality Date   ABDOMINAL HYSTERECTOMY  6213   APPLICATION OF CRANIAL NAVIGATION Right 07/06/2021   Procedure: APPLICATION OF CRANIAL NAVIGATION;  Surgeon: Dawley, Theodoro Doing, DO;  Location: [facility];  Service: Neurosurgery;  Laterality: Right;   CRANIOTOMY Right 07/06/2021   Procedure: CRANIOTOMY TUMOR EXCISION;  Surgeon: Karsten Ro, DO;  Location: [facility];  Service: Neurosurgery;  Laterality: Right;   TONSILLECTOMY  1965   TOOTH EXTRACTION      SOCIAL HISTORY: Social History   Socioeconomic History   Marital status: Divorced    Spouse name: Not on file   Number of children: 3   Years of education: 20   Highest education level: Not on file  Occupational History   Occupation: Masters degree in Psychologist, counselling: [employer]   Occupation: Working on Engineer, maintenance   Occupation: Training and development officer of [employer]  Tobacco Use   Smoking status: Never   Smokeless tobacco: Never  Vaping Use   Vaping Use: Never used  Substance and Sexual  Activity   Alcohol use: No   Drug use: No   Sexual activity: Yes    Birth control/protection: Surgical  Other Topics Concern   Not on file  Social History Narrative   Divorced, lives in [place] alone.  Independent of ADLs.   Social Determinants of Health   Financial Resource Strain: Not on file  Food Insecurity: Not on file  Transportation Needs: Not on file  Physical Activity: Not on file  Stress: Not on file  Social Connections: Not on file  Intimate Partner Violence: Not on file    FAMILY HISTORY: Family History  Problem Relation Age of Onset   Diabetes Mother    Hypertension Mother    Hypertension Father    Leukemia Father    Stroke Father    Cancer Brother    Pneumonia Brother     ALLERGIES:  is allergic to fexofenadine and penicillins.  MEDICATIONS:  Current Facility-Administered Medications  Medication Dose Route Frequency Provider Last Rate Last Admin   acetaminophen (TYLENOL) tablet 650 mg  650 mg Oral Q6H PRN Howerter, Justin B, DO   650 mg at 11/13/21 1009   Or   acetaminophen (TYLENOL) suppository 650 mg  650 mg Rectal Q6H PRN Howerter, Justin B, DO       ALPRAZolam Duanne Moron) tablet 0.25 mg  0.25 mg Oral Once Howerter, Justin B, DO       ALPRAZolam (XANAX) tablet 0.25 mg  0.25 mg Oral QHS PRN Howerter, Justin B, DO  0.25 mg at 11/12/21 2106   apixaban (ELIQUIS) tablet 5 mg  5 mg Oral BID Aline August, MD   5 mg at 11/13/21 0957   bisacodyl (DULCOLAX) EC tablet 10 mg  10 mg Oral Daily PRN Aline August, MD   10 mg at 11/12/21 0808   cefTRIAXone (ROCEPHIN) 1 g in sodium chloride 0.9 % 100 mL IVPB  1 g Intravenous Q24H Howerter, Justin B, DO 200 mL/hr at 11/13/21 0834 1 g at 11/13/21 0834   dexamethasone (DECADRON) injection 10 mg  10 mg Intravenous Q24H Howerter, Justin B, DO   10 mg at 11/13/21 1427   diphenhydrAMINE (BENADRYL) capsule 25 mg  25 mg Oral Q6H PRN Howerter, Justin B, DO       famotidine (PEPCID) tablet 20 mg  20 mg Oral Daily Howerter,  Justin B, DO   20 mg at 11/13/21 3825   lactated ringers infusion   Intravenous Continuous Aline August, MD 50 mL/hr at 11/12/21 1945 New Bag at 11/12/21 1945   levothyroxine (SYNTHROID) tablet 50 mcg  50 mcg Oral Daily Howerter, Justin B, DO   50 mcg at 11/13/21 0457   morphine (PF) 2 MG/ML injection 2 mg  2 mg Intravenous Q2H PRN Alekh, Kshitiz, MD       polyethylene glycol (MIRALAX / GLYCOLAX) packet 17 g  17 g Oral Daily PRN Aline August, MD       traMADol (ULTRAM) tablet 50 mg  50 mg Oral Q6H PRN Aline August, MD        REVIEW OF SYSTEMS:   Constitutional: Denies fevers, chills or abnormal weight loss Eyes: Denies blurriness of vision Ears, nose, mouth, throat, and face: Denies mucositis or sore throat Respiratory: Denies cough, dyspnea or wheezes Cardiovascular: Denies palpitation, chest discomfort or lower extremity swelling Gastrointestinal:  Denies nausea, constipation, diarrhea GU: Denies dysuria or incontinence Skin: Denies abnormal skin rashes Neurological: Per HPI Musculoskeletal: Denies joint pain, back or neck discomfort. No decrease in ROM Behavioral/Psych: Denies anxiety, disturbance in thought content, and mood instability   PHYSICAL EXAMINATION: Vitals:   11/13/21 0429 11/13/21 1248  BP: 129/74 131/73  Pulse: 81 96  Resp: 20 18  Temp: 97.8 F (36.6 C) 98.3 F (36.8 C)  SpO2: 100% 99%   KPS: 50. General: eyes closed in bed Head: Normal EENT: No conjunctival injection or scleral icterus.  Lungs: Resp effort normal Cardiac: Regular rate Abdomen: Non-distended abdomen Skin: normal Extremities: 2+ dependent edema R>L   Neurologic Exam: Mental Status: Drowsy, but alert, attentive to examiner with stimulation. Oriented to self and environment. Language is fluent with intact comprehension.  Cranial Nerves: Visual acuity is grossly normal. Visual fields are full. Extra-ocular movements intact. No ptosis. Face is symmetric Motor: Tone and bulk are normal.  Power is 4/5 in left arm and leg, asterixis noted with posturing. Reflexes are symmetric, no pathologic reflexes present.  Sensory: Intact to light touch Gait: Non ambulatory   LABORATORY DATA:  I have reviewed the data as listed Lab Results  Component Value Date   WBC 7.0 11/11/2021   HGB 10.5 (L) 11/11/2021   HCT 31.7 (L) 11/11/2021   MCV 94.1 11/11/2021   PLT 255 11/11/2021   Recent Labs    11/02/21 1249 11/10/21 2124 11/11/21 0522  NA 143 137 138  K 3.8 4.1 4.0  CL 109 104 106  CO2 '27 25 25  '$ GLUCOSE 101* 101* 118*  BUN '22 21 22  '$ CREATININE 1.32* 1.15* 0.97  CALCIUM 8.5* 8.6*  8.9  GFRNONAA 43* 51* >60  PROT 6.1* 6.5 6.4*  ALBUMIN 3.4* 3.2* 3.1*  AST 11* 13* 12*  ALT '12 12 12  '$ ALKPHOS 65 46 48  BILITOT 0.4 0.6 0.5    RADIOGRAPHIC STUDIES: I have personally reviewed the radiological images as listed and agreed with the findings in the report. DG Chest 2 View  Result Date: 11/10/2021 CLINICAL DATA:  Decreased O2 sats. EXAM: CHEST - 2 VIEW COMPARISON:  Chest x-ray 07/13/2011. FINDINGS: The heart is enlarged, unchanged. There is mild elevation of the right hemidiaphragm. There is no definite focal lung infiltrate, pleural effusion or pneumothorax. No acute fractures are seen. IMPRESSION: 1. Cardiomegaly. 2. No evidence for pneumonia or edema. Electronically Signed   By: Ronney Asters M.D.   On: 11/10/2021 19:38   CT HEAD W & WO CONTRAST (5MM)  Result Date: 11/10/2021 CLINICAL DATA:  Initial evaluation for brain metastases suspected. EXAM: CT HEAD WITHOUT AND WITH CONTRAST TECHNIQUE: Contiguous axial images were obtained from the base of the skull through the vertex without and with intravenous contrast. RADIATION DOSE REDUCTION: This exam was performed according to the departmental dose-optimization program which includes automated exposure control, adjustment of the mA and/or kV according to patient size and/or use of iterative reconstruction technique. CONTRAST:  24m  OMNIPAQUE IOHEXOL 300 MG/ML  SOLN COMPARISON:  Prior MRI from 10/06/2021. FINDINGS: Brain: Postoperative changes from prior right-sided craniotomy for tumor resection again seen. Underlying dural thickening and enhancement, similar to previous. Prominent peripheral enhancement again seen about the resection cavity, similar to previous. Additionally, the overall resection cavity appears increased in size from previous, now measuring up to 4.3 cm (series 4, image 14, previously 3.4 cm when measured in a similar fashion on prior exam. Extensive surrounding vasogenic edema with regional mass effect appears somewhat worsened. Worsened mass effect on the right lateral ventricle which is largely effaced. Associated right-to-left shift now measures up to 9 mm. No overt ventricular trapping. Basilar cisterns remain patent. No other visible distant lesion or abnormal enhancement visible by CT. No acute intracranial hemorrhage or superimposed large vessel territory infarct. No other extra-axial fluid collection. Vascular: No hyperdense vessel prior to contrast administration. Normal intravascular enhancement seen following contrast administration. Skull: Prior right craniotomy. No acute scalp soft tissue abnormality. Calvarium otherwise intact. Sinuses/Orbits: Right gaze noted. Paranasal sinuses are clear. No mastoid effusion. Other: None. IMPRESSION: 1. Postoperative changes from prior right-sided craniotomy for tumor resection. Prominent peripheral enhancement again seen about the resection cavity, with overall increased size of the cavity as compared to previous. Findings consistent with progressive disease. 2. Worsened surrounding vasogenic edema and regional mass effect. Associated right-to-left midline shift now measures up to 9 mm. No ventricular trapping. Electronically Signed   By: BJeannine BogaM.D.   On: 11/10/2021 23:15   CT Angio Chest PE W and/or Wo Contrast  Result Date: 10/26/2021 CLINICAL DATA:   Shortness of breath, known DVT EXAM: CT ANGIOGRAPHY CHEST WITH CONTRAST TECHNIQUE: Multidetector CT imaging of the chest was performed using the standard protocol during bolus administration of intravenous contrast. Multiplanar CT image reconstructions and MIPs were obtained to evaluate the vascular anatomy. RADIATION DOSE REDUCTION: This exam was performed according to the departmental dose-optimization program which includes automated exposure control, adjustment of the mA and/or kV according to patient size and/or use of iterative reconstruction technique. CONTRAST:  767mOMNIPAQUE IOHEXOL 350 MG/ML SOLN COMPARISON:  CT chest dated 06/30/2021 FINDINGS: Cardiovascular: Satisfactory opacification of the bilateral pulmonary arteries to the  lobar level. Lobar pulmonary emboli in the right upper and lower lobes (series 4/images 45 and 68). Segmental/subsegmental pulmonary emboli in the left lower lobe (series 4/images 64 and 81). Overall clot burden is small. No evidence of right heart strain. Mild cardiomegaly. No pericardial effusion. No evidence thoracic aortic aneurysm. Atherosclerotic calcifications of the descending thoracic aorta. Mediastinum/Nodes: No suspicious mediastinal lymphadenopathy. Visualized thyroid is unremarkable. Lungs/Pleura: Evaluation of the lung parenchyma is constrained by respiratory motion. Within that constraint, there are no suspicious pulmonary nodules. Volume loss in the left hemithorax. No focal consolidation. No pleural effusion or pneumothorax. Upper Abdomen: Visualized upper abdomen is notable for scattered hepatic cysts. Musculoskeletal: Visualized osseous structures are within normal limits. Review of the MIP images confirms the above findings. IMPRESSION: Lobar and segmental pulmonary emboli, as above. Overall clot burden is small. No evidence of right heart strain. These results were called by telephone at the time of interpretation on 10/26/2021 at 12:31 am to provider [facility] , who verbally acknowledged these results. Aortic Atherosclerosis (ICD10-I70.0). Electronically Signed   By: Julian Hy M.D.   On: 10/26/2021 00:35   VAS [place] LOWER EXTREMITY VENOUS (DVT)  Result Date: 10/25/2021  Lower Venous DVT Study Patient Name:  TYSHEENA GINZBURG  Date of Exam:   10/25/2021 Medical Rec #: [id number]        Accession #:    [id number] Date of Birth: Jun 25, 1950        Patient Gender: F Patient Age:   29 years Exam Location:  [facility] Procedure:      VAS [place] LOWER EXTREMITY VENOUS (DVT) Referring Phys: Alroy Dust Ayn Domangue --------------------------------------------------------------------------------  Indications: Edema, and Pain.  Risk Factors: Cancer. Limitations: Body habitus, poor ultrasound/tissue interface and patient pain intolerance. Comparison Study: No previous exams Performing Technologist: Jody Hill RVT, RDMS  Examination Guidelines: A complete evaluation includes B-mode imaging, spectral Doppler, color Doppler, and power Doppler as needed of all accessible portions of each vessel. Bilateral testing is considered an integral part of a complete examination. Limited examinations for reoccurring indications may be performed as noted. The reflux portion of the exam is performed with the patient in reverse Trendelenburg.  +--------+---------------+---------+-----------+----------+--------------------+ RIGHT   CompressibilityPhasicitySpontaneityPropertiesThrombus Aging       +--------+---------------+---------+-----------+----------+--------------------+ CFV     None           Yes      Yes                  Age Indeterminate    +--------+---------------+---------+-----------+----------+--------------------+ SFJ     None                                         Age Indeterminate    +--------+---------------+---------+-----------+----------+--------------------+ FV Prox None           No       No                   Age Indeterminate     +--------+---------------+---------+-----------+----------+--------------------+ FV Mid                                               Not visualized       +--------+---------------+---------+-----------+----------+--------------------+ FV  Not visualized       Distal                                                                    +--------+---------------+---------+-----------+----------+--------------------+ PFV                    Yes      Yes                  patent by                                                                 color/doppler        +--------+---------------+---------+-----------+----------+--------------------+ POP     Partial        Yes      Yes                  Age Indeterminate    +--------+---------------+---------+-----------+----------+--------------------+ PTV                                                  Not visualized       +--------+---------------+---------+-----------+----------+--------------------+ PERO                                                 Not visualized       +--------+---------------+---------+-----------+----------+--------------------+ EIV                    Yes      Yes                  patent by                                                                 color/doppler        +--------+---------------+---------+-----------+----------+--------------------+ External iliac vein not well visualized, it is patent by color and doppler.  Right Technical Findings: Not visualized segments include Mid/distal femoral, peroneal, and posterior tibial veins.  +---------+---------------+---------+-----------+----------+------------------+ LEFT     CompressibilityPhasicitySpontaneityPropertiesThrombus Aging     +---------+---------------+---------+-----------+----------+------------------+ CFV      Full           Yes      Yes                                      +---------+---------------+---------+-----------+----------+------------------+ SFJ      Full                                                            +---------+---------------+---------+-----------+----------+------------------+  FV Prox  Full           Yes      Yes                                     +---------+---------------+---------+-----------+----------+------------------+ FV Mid   Full           Yes      Yes                                     +---------+---------------+---------+-----------+----------+------------------+ FV DistalNone           No       No                   Age Indeterminate  +---------+---------------+---------+-----------+----------+------------------+ PFV      Full                                                            +---------+---------------+---------+-----------+----------+------------------+ POP      None           No       No                   Age Indeterminate  +---------+---------------+---------+-----------+----------+------------------+ PTV                     No       No                   one of paired PTVs +---------+---------------+---------+-----------+----------+------------------+ PERO     None           No       No                   Age Indeterminate  +---------+---------------+---------+-----------+----------+------------------+ Posterior tibials veins visualized by color/doppler only - unable to visualize for compression.    Summary: BILATERAL: -No evidence of popliteal cyst, bilaterally. RIGHT: - Findings consistent with age indeterminate deep vein thrombosis involving the right common femoral vein, SF junction, right femoral vein, and right popliteal vein. - Portions of this examination were limited- see technologist comments above.  LEFT: - Findings consistent with age indeterminate deep vein thrombosis involving the left femoral vein, left popliteal vein, left posterior tibial  veins, and left peroneal veins. - Portions of this examination were limited- see technologist comments above.  *See table(s) above for measurements and observations. Electronically signed by Jamelle Haring on 10/25/2021 at 9:16:06 PM.    Final     ASSESSMENT & PLAN:  Glioblastoma  Hannah Beat Gundy presents with clinical and radiographic progression of right temporal glioblastoma.  Degree of edema has increased since prior imaging last month.    We engaged in another dedicated goals of care conversation with the patient and her family.  We first discussed hospice/comfort given the degree of impairment in functional status, aggressive nature of glioblastoma, age >37.  If she wants to remain on medical therapy, we could still potentially offer avastin monotherapy, but no cytotoxic chemo.  She understands this may help with her symptoms but not prolong her life at all.  We recommended continuing higher dose decadron, may discharge on '4mg'$  BID.  Did support palliative care evaluating the patient and speaking with family further as they work through goals of care decision pathway.  Will con't to follow along, call/text with any questions.  All questions were answered. The patient knows to call the clinic with any problems, questions or concerns.  The total time spent in the encounter was 55 minutes and more than 50% was on counseling and review of test results     Ventura Sellers, MD 11/13/2021 6:04 PM

## 2021-11-13 NOTE — Progress Notes (Signed)
Palliative care brief note  I met briefly with Denise Wright and her daughter.  She was sitting in bed in no distress eating lunch at time of my encounter.  Daughter reports being appreciative of me stopping by, however, she states they have still not had a chance to touch base with Dr. Mickeal Skinner.  We discussed plan for palliative care to follow-up again tomorrow to continue conversation once we have his input.  While I am off service, I will ask another member of the team to check in with her tomorrow.  Micheline Rough, MD [place] Team [phone]  No charge note

## 2021-11-13 NOTE — Consult Note (Signed)
Consultation Note Date: 11/13/2021   Patient Name: Denise Wright  DOB: 08/11/1950  MRN: [id number]  Age / Sex: 71 y.o., female  PCP: Willey Blade, MD Referring Physician: Aline August, MD  Reason for Consultation: Establishing goals of care  HPI/Patient Profile: 71 y.o. 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  Dr. Mickeal Skinner follows her as an outpatient and is aware of admission with recommendation for continuation of Decadron and he is going to evaluate on Monday.  Palliative consulted for goals of care.  Clinical Assessment and Goals of Care: I met today with Ms. Decelle, her daughter, her pastor, and her pastor's wife.  I introduced palliative care as specialized medical care for people living with serious illness. It focuses on providing relief from the symptoms and stress of a serious illness. The goal is to improve quality of life for both the patient and the family.  We discussed clinical course as well as wishes moving forward in regard to advanced directives and care plan this hospitalization.  We discussed difference between a aggressive medical intervention path and a palliative, comfort focused care path.  Values and goals of care important to patient and family were attempted to be elicited.  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  We discussed that she continues to try to give back to others is much as she can and the things that are most important to her are her family and her faith.  We discussed the difference between hospice and  palliative care and shared goal of adding as much time and quality to Ms. Klebba's life as possible.  Discussed that at times this includes things such as disease modifying therapy with radiation, surgery, immunotherapy, or targeted therapy.  We also discussed if we are at the point where these are not likely to be beneficial, this does not mean that there is not care left to give, however, care will need to be focused on aggressive symptom management improving functional status and quality of life.  Discussed brief overview of hospice and how this could be beneficial if we are at a point where further disease modifying therapy is not likely to add quality and/or time to her life.  We discussed plan for evaluation by Dr. Mickeal Skinner tomorrow and they are open to further conversation once they have his thoughts on her current situation.   Questions and concerns addressed.   PMT will continue to support holistically.   SUMMARY OF RECOMMENDATIONS   - Agree with DNR - Continue current care.  We had a good discussion today regarding care in light of terminal, progressive illness.  Plan at this point is to await evaluation by Dr. Mickeal Skinner who knows patient well  and reassess after he has had a chance to evaluate her tomorrow. -Initial discussions regarding both palliative care support and hospice support today.  I left a copy of hard choices for loving people with her daughter to review. -Palliative to plan to follow-up again tomorrow.  Code Status/Advance Care Planning: DNR  Prognosis:  If decision were made to pursue hospice services, her expected prognosis if her disease follows its natural course without further disease modifying therapy would be less than 6 months and she should qualify for home hospice services if so desired.  Discharge Planning: To Be Determined      Primary Diagnoses: Present on Admission:  Cerebral edema ([place])  Acute cystitis  Generalized anxiety disorder  Hypothyroidism   GERD (gastroesophageal reflux disease)   I have reviewed the medical record, interviewed the patient and family, and examined the patient. The following aspects are pertinent.  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  Independent of ADLs.   Social Determinants of Radio broadcast assistant Strain: Not on file  Food Insecurity: Not on file  Transportation Needs: Not on file  Physical Activity: Not on file  Stress: Not on file  Social Connections: Not on file   Family History  Problem Relation Age of Onset   Diabetes Mother    Hypertension Mother    Hypertension Father    Leukemia Father    Stroke Father    Cancer Brother    Pneumonia Brother    Scheduled Meds:  ALPRAZolam  0.25 mg Oral Once   apixaban  5 mg Oral BID   dexamethasone (DECADRON) injection  10 mg Intravenous Q24H   famotidine  20 mg Oral Daily   levothyroxine  50 mcg Oral Daily   Continuous Infusions:  cefTRIAXone (ROCEPHIN)  IV 1 g (11/13/21 0834)   lactated ringers 50 mL/hr at 11/12/21 1945   PRN Meds:.acetaminophen **OR** acetaminophen, ALPRAZolam, bisacodyl, diphenhydrAMINE,  morphine injection, polyethylene glycol, traMADol Medications Prior to Admission:  Prior to Admission medications   Medication Sig Start Date End Date Taking? Authorizing Provider  APIXABAN (ELIQUIS) VTE STARTER PACK (10MG AND 5MG) Take as directed on package: start with two-33m tablets twice daily for 7 days. On day 8, switch to one-545mtablet twice daily. 10/25/21  Yes Vaslow, ZaAcey LavMD  b complex vitamins capsule Take 1 capsule by mouth daily.   Yes [provider]  cetirizine (ZYRTEC) 10 MG tablet Take 10 mg by mouth daily.   Yes [provider]  cholecalciferol (VITAMIN D) 1000 UNITS  tablet Take 2,000 Units by mouth daily.   Yes [provider]  dexamethasone (DECADRON) 1 MG tablet Take 1 tablet (1 mg total) by mouth daily. 10/23/21  Yes Vaslow, Acey Lav, MD  docusate sodium (COLACE) 100 MG capsule Take 1 capsule (100 mg total) by mouth 2 (two) times daily. 07/21/21  Yes Angiulli, Lavon Paganini, PA-C  famotidine (PEPCID) 20 MG tablet Take 1 tablet (20 mg total) by mouth daily. 08/31/21  Yes Vaslow, Acey Lav, MD  levothyroxine (SYNTHROID) 50 MCG tablet Take 1 tablet (50 mcg total) by mouth daily. 07/21/21  Yes Angiulli, Lavon Paganini, PA-C  Multiple Vitamins-Minerals (MULTIVITAMIN WITH MINERALS) tablet Take 1 tablet by mouth daily.   Yes [provider]  acetaminophen (TYLENOL) 325 MG tablet Take 2 tablets (650 mg total) by mouth every 6 (six) hours as needed for mild pain (or Fever >/= 101). Patient not taking: Reported on 11/10/2021 07/21/21   Angiulli, Lavon Paganini, PA-C  ALPRAZolam Duanne Moron) 0.25 MG tablet Take 1 tablet (0.25 mg total) by mouth 2 (two) times daily as needed for anxiety. Patient not taking: Reported on 11/10/2021 10/18/21   Meredith Staggers, MD  ondansetron (ZOFRAN) 8 MG tablet Take 1 tablet by mouth 2 times daily as needed (nausea and vomiting). May take 30-60 minutes prior to Temodar administration if nausea/vomiting occurs. Patient not taking: Reported on  11/10/2021 10/23/21   Ventura Sellers, MD  temozolomide (TEMODAR) 140 MG capsule Take 1 capsule (140 mg total) by mouth daily. May take on an empty stomach to decrease nausea & vomiting. Patient not taking: Reported on 11/10/2021 10/23/21   Ventura Sellers, MD  temozolomide (TEMODAR) 180 MG capsule Take 1 capsule (180 mg total) by mouth daily. May take on an empty stomach to decrease nausea & vomiting. Patient not taking: Reported on 11/10/2021 10/23/21   Ventura Sellers, MD   Allergies  Allergen Reactions   Fexofenadine Hives   Penicillins Itching and Other (See Comments)    Pulling sensation in body   Review of Systems Denies complaints today  Physical Exam General: Alert, awake, in no acute distress.  Slow to process and answer questions Heart: Regular rate and rhythm. No murmur appreciated. Lungs: Good air movement, clear Abdomen: Soft, nontender, nondistended, positive bowel sounds.   Ext: No significant edema Skin: Warm and dry  Vital Signs: BP 129/74 (BP Location: Right Arm)   Pulse 81   Temp 97.8 F (36.6 C) (Oral)   Resp 20   Ht 5' 2.5" (1.588 m)   Wt 101.4 kg   SpO2 100%   BMI 40.24 kg/m  Pain Scale: 0-10 POSS *See Group Information*: 1-Acceptable,Awake and alert Pain Score: 0-No pain   SpO2: SpO2: 100 % O2 Device:SpO2: 100 % O2 Flow Rate: .   IO: Intake/output summary:  Intake/Output Summary (Last 24 hours) at 11/13/2021 0908 Last data filed at 11/13/2021 0421 Gross per 24 hour  Intake 1759.67 ml  Output 400 ml  Net 1359.67 ml    LBM: Last BM Date : 11/11/21 Baseline Weight: Weight: 105 kg Most recent weight: Weight: 101.4 kg     Palliative Assessment/Data:   Flowsheet Rows    Flowsheet Row Most Recent Value  Intake Tab   Referral Department Hospitalist  Unit at Time of Referral Med/Surg Unit  Palliative Care Primary Diagnosis Cancer  Date Notified 11/11/21  Palliative Care Type New Palliative care  Reason for referral Clarify Goals of Care  Date  of Admission 11/10/21  Date first  seen by Palliative Care 11/12/21  # of days Palliative referral response time 1 Day(s)  # of days IP prior to Palliative referral 1  Clinical Assessment   Palliative Performance Scale Score 40%  Psychosocial & Spiritual Assessment   Palliative Care Outcomes   Patient/Family meeting held? Yes  Who was at the meeting? 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       Time In: 57 Time Out: 1700 Time Total: 78 Greater than 50%  of this time was spent counseling and coordinating care related to the above assessment and plan.  Signed by: Micheline Rough, MD   Please contact Palliative Medicine Team phone at [phone] for questions and concerns.  For individual provider: See Shea Evans

## 2021-11-13 NOTE — Progress Notes (Signed)
  Transition of Care [facility]) Screening Note   Patient Details  Name: Denise Wright Date of Birth: [birth year]   Transition of Care [facility]) CM/SW Contact:    Dessa Phi, RN Phone Number: 11/13/2021, 10:49 AM    Transition of Care Department [facility]) has reviewed patient and no TOC needs have been identified at this time. We will continue to monitor patient advancement through interdisciplinary progression rounds. If new patient transition needs arise, please place a TOC consult.

## 2021-11-14 ENCOUNTER — Other Ambulatory Visit: Payer: Self-pay | Admitting: Internal Medicine

## 2021-11-14 DIAGNOSIS — R5381 Other malaise: Secondary | ICD-10-CM

## 2021-11-14 DIAGNOSIS — G936 Cerebral edema: Secondary | ICD-10-CM | POA: Diagnosis not present

## 2021-11-14 DIAGNOSIS — Z86718 Personal history of other venous thrombosis and embolism: Secondary | ICD-10-CM | POA: Diagnosis not present

## 2021-11-14 DIAGNOSIS — N3000 Acute cystitis without hematuria: Secondary | ICD-10-CM | POA: Diagnosis not present

## 2021-11-14 DIAGNOSIS — F411 Generalized anxiety disorder: Secondary | ICD-10-CM | POA: Diagnosis not present

## 2021-11-14 NOTE — TOC Initial Note (Signed)
Transition of Care [facility]) - Initial/Assessment Note    Patient Details  Name: Denise Wright MRN: [id number] Date of Birth: [birth year]  Transition of Care [facility]) CM/SW Contact:    Dessa Phi, RN Phone Number: 11/14/2021, 2:36 PM  Clinical Narrative: [facility] agree to fax out for ST SNF;[facility] otpt palliative care services to follow @ SNF.Await bed offers.                Expected Discharge Plan: Skilled Nursing Facility Barriers to Discharge: Continued Medical Work up   Patient Goals and CMS Choice Patient states their goals for this hospitalization and ongoing recovery are:: Rehab CMS Medicare.gov Compare Post Acute Care list provided to:: Patient Represenative (must comment) Choice offered to / list presented to : Adult Children  Expected Discharge Plan and Services Expected Discharge Plan: [place]   Discharge Planning Services: CM Consult Post Acute Care Choice: [place] Living arrangements for the past 2 months: Single Family Home                                      Prior Living Arrangements/Services Living arrangements for the past 2 months: Single Family Home Lives with:: Adult Children Patient language and need for interpreter reviewed:: Yes Do you feel safe going back to the place where you live?: Yes        Care giver support system in place?: Yes (comment) Current home services: DME (hospital bed/hoyer lift,w/c.) Criminal Activity/Legal Involvement Pertinent to Current Situation/Hospitalization: No - Comment as needed  Activities of Daily Living Home Assistive Devices/Equipment: [place] Hospital bed ADL Screening (condition at time of admission) Patient's cognitive ability adequate to safely complete daily activities?: No Is the patient deaf or have difficulty hearing?: No Does the patient have difficulty seeing, even when wearing glasses/contacts?: No Does the patient have difficulty concentrating,  remembering, or making decisions?: Yes Patient able to express need for assistance with ADLs?: No Does the patient have difficulty dressing or bathing?: Yes Independently performs ADLs?: No Communication: Needs assistance Dressing (OT): Dependent Grooming: Dependent Feeding: Needs assistance Bathing: Dependent Toileting: Dependent In/Out Bed: Dependent Walks in Home: Dependent Does the patient have difficulty walking or climbing stairs?: Yes Weakness of Legs: Both Weakness of Arms/Hands: Both  Permission Sought/Granted Permission sought to share information with : Case Manager Permission granted to share information with : Yes, Verbal Permission Granted  Share Information with NAME: CASE Manager           Emotional Assessment Appearance:: Appears stated age Attitude/Demeanor/Rapport: Gracious Affect (typically observed): Accepting Orientation: : Oriented to Self, Oriented to Place, Oriented to  Time Alcohol / Substance Use: Not Applicable Psych Involvement: No (comment)  Admission diagnosis:  Cerebral edema ([place]) [G93.6] Altered mental status, unspecified altered mental status type [R41.82] Patient Active Problem List   Diagnosis Date Noted   Obesity, Class III, BMI 40-49.9 (morbid obesity) ([place]) 11/14/2021   Physical deconditioning 11/14/2021   Acute cystitis 11/11/2021   GERD (gastroesophageal reflux disease) 11/11/2021   History of DVT (deep vein thrombosis) 11/11/2021   Cerebral edema ([facility]) 11/10/2021   Generalized anxiety disorder 10/18/2021   Goals of care, counseling/discussion 07/20/2021   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[place]) 07/12/2021   Brain mass 06/30/2021   Hypothyroidism 06/30/2021   Hypokalemia 06/30/2021   Essential hypertension 05/25/2021   Hyperglycemia 07/14/2011   Normocytic anemia 07/13/2011   PCP:  Karlton Lemon,  Joelene Millin, MD Pharmacy:   [id number] [place] [id number]: [phone]: [phone] [place]: [phone]: [phone]    Social Determinants of Health (SDOH) Interventions    Readmission Risk Interventions    11/13/2021   10:49 AM  Readmission Risk Prevention Plan  Transportation Screening Complete  PCP or Specialist Appt within 3-5 Days Complete  HRI or HOnyxComplete  Social Work Consult for [place]anning/Counseling Complete  Palliative Care Screening Complete  Medication Review (Press photographer Complete

## 2021-11-14 NOTE — Progress Notes (Signed)
Pharmacist Chemotherapy Monitoring - Initial Assessment   6/ Anticipated start date: 11/21/21   The following has been reviewed per standard work regarding the patient's treatment regimen: The patient's diagnosis, treatment plan and drug doses, and organ/hematologic function Lab orders and baseline tests specific to treatment regimen  The treatment plan start date, drug sequencing, and pre-medications Prior authorization status  Patient's documented medication list, including drug-drug interaction screen and prescriptions for anti-emetics and supportive care specific to the treatment regimen The drug concentrations, fluid compatibility, administration routes, and timing of the medications to be used The patient's access for treatment and lifetime cumulative dose history, if applicable  The patient's medication allergies and previous infusion related reactions, if applicable   Changes made to treatment plan:  treatment plan date  Follow up needed:  Pending authorization for treatment    Judge Stall, RPH, 11/14/2021  1:20 PM

## 2021-11-14 NOTE — Progress Notes (Signed)
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  On presentation UA was suggestive of UTI.  Chest x-ray was negative for any cardiopulmonary process.  CT of the head with and without contrast showed evidence of vasogenic edema and regional mass effect associated with right to left midline shift up to 9 mm in the absence of any associated intracranial hemorrhage/acute infarct.  She was started on IV Decadron and Rocephin.  Neurooncology and palliative care were consulted.  Assessment & Plan:   Cerebral edema History of glioblastoma on Temodar Failure to thrive/poor oral intake -Patient has been on oral Temodar and Decadron as an outpatient and follows up with Dr. Mickeal Skinner and was supposed to see him on 11/13/2021 -Presented with worsening oral intake and CT showed worsening cerebral edema. -Currently on Decadron 10 mg IV daily. -Palliative care following. -Continue regular diet if tolerated.  DC IV fluids. -Dr. Mickeal Skinner evaluated the patient yesterday.  Follow further recommendations.  He is recommending Decadron 4 mg twice daily upon discharge.  UTI/acute cystitis: Present on admission -Currently on Rocephin.  Urine cultures grew multiple species.  DC Rocephin.  Recently diagnosed bilateral lower extremities -Diagnosed in March 2023 continue Eliquis  Generalized anxiety disorder -Continue as needed Xanax  Constipation -Continue scheduled stool softeners and laxatives as needed  Hypothyroidism--continue levothyroxine  Physical deconditioning -PT eval.  Patient/family think that patient is deconditioned and would benefit from SNF placement.  TOC consult.  Morbid obesity -Outpatient follow-up   DVT prophylaxis:  Eliquis Code Status: DNR Family Communication: Sister at bedside Disposition Plan: Status is: Inpatient Remains inpatient appropriate because: Of severity of illness    Consultants: Palliative care.  Neurooncology/Dr. Mickeal Skinner  Procedures: None  Antimicrobials: Rocephin from 11/10/2021 onwards   Subjective: Patient seen and examined at bedside.  Poor historian.  Denies worsening shortness of breath, fever, vomiting.  Feels weak.   Objective: Vitals:   11/13/21 1248 11/13/21 2047 11/14/21 0402 11/14/21 0528  BP: 131/73 138/67  (!) 172/86  Pulse: 96 85  60  Resp: '18 20  17  '$ Temp: 98.3 F (36.8 C) 98.9 F (37.2 C)  (!) 97.5 F (36.4 C)  TempSrc: Oral Oral  Oral  SpO2: 99% 99%  100%  Weight:   102.7 kg   Height:        Intake/Output Summary (Last 24 hours) at 11/14/2021 0954 Last data filed at 11/14/2021 0600 Gross per 24 hour  Intake 1108.56 ml  Output 2100 ml  Net -991.44 ml    Filed Weights   11/12/21 0500 11/13/21 0458 11/14/21 0402  Weight: 104.4 kg 101.4 kg 102.7 kg    Examination:  General: On room air.  No distress.  Looks chronically ill and deconditioned.  Slow to respond.  Slightly confused.  Answers some questions. respiratory: Decreased breath sounds at bases bilaterally with some crackles CVS: Currently rate controlled; S1-S2 heard  abdominal: Soft, morbidly obese, nontender, slightly distended, no organomegaly; normal bowel sounds are heard  extremities: Trace lower extremity edema; no clubbing.      Data Reviewed: I have personally reviewed following labs and imaging studies  CBC: Recent Labs  Lab 11/10/21 2124 11/11/21 0522  WBC 6.4 7.0  NEUTROABS 4.9 6.4  HGB 10.6* 10.5*  HCT 32.9* 31.7*  MCV 95.6 94.1  PLT 266 694    Basic Metabolic Panel: Recent Labs  Lab 11/10/21 2124 11/11/21 0522  NA 137 138  K 4.1 4.0  CL 104 106  CO2 25 25  GLUCOSE 101* 118*  BUN 21 22  CREATININE 1.15* 0.97  CALCIUM 8.6* 8.9  MG 2.2 2.0     GFR: Estimated Creatinine Clearance: 61.3 mL/min (by C-G formula based on SCr of 0.97 mg/dL). Liver Function Tests: Recent Labs  Lab 11/10/21 2124 11/11/21 0522  AST 13* 12*  ALT 12 12  ALKPHOS 46 48  BILITOT 0.6 0.5  PROT 6.5 6.4*  ALBUMIN 3.2* 3.1*    No results for input(s): LIPASE, AMYLASE in the last 168 hours. No results for input(s): AMMONIA in the last 168 hours. Coagulation Profile: No results for input(s): INR, PROTIME in the last 168 hours. Cardiac Enzymes: No results for input(s): CKTOTAL, CKMB, CKMBINDEX, TROPONINI in the last 168 hours. BNP (last 3 results) No results for input(s): PROBNP in the last 8760 hours. HbA1C: No results for input(s): HGBA1C in the last 72 hours. CBG: No results for input(s): GLUCAP in the last 168 hours. Lipid Profile: No results for input(s): CHOL, HDL, LDLCALC, TRIG, CHOLHDL, LDLDIRECT in the last 72 hours. Thyroid Function Tests: No results for input(s): TSH, T4TOTAL, FREET4, T3FREE, THYROIDAB in the last 72 hours.  Anemia Panel: No results for input(s): VITAMINB12, FOLATE, FERRITIN, TIBC, IRON, RETICCTPCT in the last 72 hours. Sepsis Labs: No results for input(s): PROCALCITON, LATICACIDVEN in the last 168 hours.  Recent Results (from the past 240 hour(s))  Urine Culture     Status: Abnormal   Collection Time: 11/09/21  7:22 AM   Specimen: Urine, Clean Catch  Result Value Ref Range Status   Specimen Description   Final    URINE, CLEAN CATCH Performed at [facility], [place] [address] [place], [place] [address]    Special Requests   Final    NONE Performed at [facility], [place] [address], [place], [place] [address]    Culture MULTIPLE SPECIES PRESENT, SUGGEST RECOLLECTION (A)  Final   Report Status 11/12/2021 FINAL  Final          Radiology Studies: No results found.      Scheduled Meds:  ALPRAZolam  0.25 mg Oral Once   apixaban  5 mg Oral BID   dexamethasone  (DECADRON) injection  10 mg Intravenous Q24H   famotidine  20 mg Oral Daily   levothyroxine  50 mcg Oral Daily   Continuous Infusions:  cefTRIAXone (ROCEPHIN)  IV 1 g (11/14/21 0747)   lactated ringers 50 mL/hr at 11/12/21 1945          [name] [name], MD [facility] 11/14/2021, 9:54 AM

## 2021-11-14 NOTE — Progress Notes (Signed)
Daily Progress Note   Patient Name: Denise Wright       Date: 11/14/2021 DOB: 1951/06/10  Age: 71 y.o. MRN#: [id number] Attending Physician: Aline August, MD Primary Care Physician: Willey Blade, MD Admit Date: 11/10/2021  Reason for Consultation/Follow-up: Establishing goals of care  Subjective:  Resting in bed, no distress Daughter sister and friend at bedside.   Length of Stay: 4  Current Medications: Scheduled Meds:   ALPRAZolam  0.25 mg Oral Once   apixaban  5 mg Oral BID   dexamethasone (DECADRON) injection  10 mg Intravenous Q24H   famotidine  20 mg Oral Daily   levothyroxine  50 mcg Oral Daily    Continuous Infusions:   PRN Meds: acetaminophen **OR** acetaminophen, ALPRAZolam, bisacodyl, diphenhydrAMINE, morphine injection, polyethylene glycol, traMADol  Physical Exam         No distress Appears with generalized weakness Regular work of breathing Trace edema  Vital Signs: BP (!) 172/86 (BP Location: Right Arm)   Pulse 60   Temp (!) 97.5 F (36.4 C) (Oral)   Resp 17   Ht 5' 2.5" (1.588 m)   Wt 102.7 kg   SpO2 100%   BMI 40.75 kg/m  SpO2: SpO2: 100 % O2 Device: O2 Device: Room Air O2 Flow Rate:    Intake/output summary:  Intake/Output Summary (Last 24 hours) at 11/14/2021 1243 Last data filed at 11/14/2021 0600 Gross per 24 hour  Intake 660 ml  Output 1550 ml  Net -890 ml   LBM: Last BM Date : 11/14/21 Baseline Weight: Weight: 105 kg Most recent weight: Weight: 102.7 kg       Palliative Assessment/Data:    Flowsheet Rows    Flowsheet Row Most Recent Value  Intake Tab   Referral Department Hospitalist  Unit at Time of Referral Med/Surg Unit  Palliative Care Primary Diagnosis Cancer  Date Notified 11/11/21  Palliative Care Type New  Palliative care  Reason for referral Clarify Goals of Care  Date of Admission 11/10/21  Date first seen by Palliative Care 11/12/21  # of days Palliative referral response time 1 Day(s)  # of days IP prior to Palliative referral 1  Clinical Assessment   Palliative Performance Scale Score 40%  Psychosocial & Spiritual Assessment   Palliative Care Outcomes   Patient/Family meeting held? 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       Patient Active Problem List   Diagnosis Date Noted   Obesity, Class III, BMI 40-49.9 (morbid obesity) ([place]) 11/14/2021   Physical deconditioning 11/14/2021   Acute cystitis 11/11/2021   GERD (gastroesophageal reflux disease) 11/11/2021   History of DVT (deep vein thrombosis) 11/11/2021   Cerebral edema (HCC) 11/10/2021   Generalized anxiety disorder 10/18/2021   Goals of care, counseling/discussion 07/20/2021   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[place]) 07/12/2021   Brain mass 06/30/2021   Hypothyroidism 06/30/2021   Hypokalemia 06/30/2021   Essential hypertension 05/25/2021   Hyperglycemia 07/14/2011   Normocytic anemia 07/13/2011    Palliative Care Assessment & Plan   Patient Profile:    Assessment:   right temporal glioblastoma Functional status decline  Recommendations/Plan:  Goals of care discussions held today: SNF rehab with palliative. Patient and family would like to proceed with Avastin therapy if offered, do not want hospice services at this time.     Code Status:    Code Status Orders  (From admission, onward)           Start     Ordered   11/11/21 0909  Do not attempt resuscitation (DNR)  Continuous       Question Answer Comment  In the event of cardiac or respiratory ARREST Do not call a "code blue"   In the event of cardiac or respiratory ARREST Do not perform Intubation, CPR, defibrillation or ACLS   In the event of cardiac or respiratory ARREST Use medication by  any route, position, wound care, and other measures to relive pain and suffering. May use oxygen, suction and manual treatment of airway obstruction as needed for comfort.      11/11/21 0909           Code Status History     Date Active Date Inactive Code Status Order ID Comments User Context   11/10/2021 2350 11/11/2021 0909 Full Code [id number]  Rhetta Mura, DO ED   07/12/2021 1351 07/22/2021 1432 Full Code [id number]  Elizabeth Sauer Inpatient   06/30/2021 1644 07/12/2021 1349 Full Code [id number]  Orma Flaming, MD Inpatient   07/13/2011 1653 07/17/2011 1810 Full Code [id number]  Buel Ream, RN Inpatient      Advance Directive Documentation    Flowsheet Row Most Recent Value  Type of Advance Directive Healthcare Power of Attorney  Pre-existing out of facility DNR order (yellow form or pink MOST form) --  "MOST" Form in Place? --       Prognosis:  Guarded   Discharge Planning: [address] for rehab with Palliative care service follow-up  Care plan was discussed with patient and family.   Thank you for allowing the Palliative Medicine Team to assist in the care of this patient.   Time In: 12 Time Out: 12.35 Total Time 35 Prolonged Time Billed No       Greater than 50%  of this time was spent counseling and coordinating care related to the above assessment and plan.  Loistine Chance, MD  Please contact Palliative Medicine Team phone at [phone] for questions and concerns.

## 2021-11-14 NOTE — Progress Notes (Signed)
Chaplain engaged in an initial visit with Denise Wright and her daughter yesterday.  Chaplain wanted to check-in and offer support.  Chaplain was able to spend some time getting to know Almeda through her daughter.  Wylma is a well known community advocate within [place].  She has worked as a [employer] for care and research for sickle cell patients for over thirty years.  Shadonna has been apart of a number of community outreach efforts to help the underserved.    Chaplain offered listening, a compassionate presence, and support.     11/14/21 1300  Clinical Encounter Type  Visited With Patient and family together  Visit Type Initial;Spiritual support  Referral From Palliative care team  Consult/Referral To Chaplain

## 2021-11-14 NOTE — NC FL2 (Signed)
[place] LEVEL OF CARE SCREENING TOOL     IDENTIFICATION  Patient Name: Latrease Kunde Persico Birthdate: 12-24-1950 Sex: female Admission Date (Current Location): 11/10/2021  [facility] and [place] Number:  Herbalist and Address:  [facility],  [place], [place]      Provider Number: [id number]  Attending Physician Name and Address:  Aline August, MD  Relative Name and Phone Number:  [name] Molter(dtr)[phone]    Current Level of Care: Hospital Recommended Level of Care: [place] Prior Approval Number:    Date Approved/Denied:   PASRR Number: [id number]  Discharge Plan: SNF    Current Diagnoses: Patient Active Problem List   Diagnosis Date Noted   Obesity, Class III, BMI 40-49.9 (morbid obesity) ([place]) 11/14/2021   Physical deconditioning 11/14/2021   Acute cystitis 11/11/2021   GERD (gastroesophageal reflux disease) 11/11/2021   History of DVT (deep vein thrombosis) 11/11/2021   Cerebral edema ([place]) 11/10/2021   Generalized anxiety disorder 10/18/2021   Goals of care, counseling/discussion 07/20/2021   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[place]) 07/12/2021   Brain mass 06/30/2021   Hypothyroidism 06/30/2021   Hypokalemia 06/30/2021   Essential hypertension 05/25/2021   Hyperglycemia 07/14/2011   Normocytic anemia 07/13/2011    Orientation RESPIRATION BLADDER Height & Weight     Self, Time, Situation, Place  Normal Continent Weight: 102.7 kg Height:  5' 2.5" (158.8 cm)  BEHAVIORAL SYMPTOMS/MOOD NEUROLOGICAL BOWEL NUTRITION STATUS      Continent Diet (Regular)  AMBULATORY STATUS COMMUNICATION OF NEEDS Skin   Limited Assist Verbally Normal                       Personal Care Assistance Level of Assistance  Bathing, Feeding, Dressing Bathing Assistance: Limited assistance   Dressing Assistance: Limited assistance     Functional Limitations Info  Sight, Hearing,  Speech Sight Info: Impaired (eyeglasses) Hearing Info: Adequate Speech Info: Adequate    SPECIAL CARE FACTORS FREQUENCY  PT (By licensed PT), OT (By licensed OT)     PT Frequency:  (5x week) OT Frequency:  (5x week)            Contractures Contractures Info: Not present    Additional Factors Info  Code Status, Allergies, Psychotropic Code Status Info:  (DNR) Allergies Info:  (Fexofenadine;Penicillins) Psychotropic Info:  (xanax prn sleep)         Current Medications (11/14/2021):  This is the current hospital active medication list Current Facility-Administered Medications  Medication Dose Route Frequency Provider Last Rate Last Admin   acetaminophen (TYLENOL) tablet 650 mg  650 mg Oral Q6H PRN Howerter, Justin B, DO   650 mg at 11/13/21 2154   Or   acetaminophen (TYLENOL) suppository 650 mg  650 mg Rectal Q6H PRN Howerter, Justin B, DO       ALPRAZolam Duanne Moron) tablet 0.25 mg  0.25 mg Oral Once Howerter, Justin B, DO       ALPRAZolam Duanne Moron) tablet 0.25 mg  0.25 mg Oral QHS PRN Howerter, Justin B, DO   0.25 mg at 11/12/21 2106   apixaban (ELIQUIS) tablet 5 mg  5 mg Oral BID Aline August, MD   5 mg at 11/14/21 0923   bisacodyl (DULCOLAX) EC tablet 10 mg  10 mg Oral Daily PRN Aline August, MD   10 mg at 11/12/21 0808   dexamethasone (DECADRON) injection 10 mg  10 mg Intravenous Q24H Howerter, Justin B, DO  10 mg at 11/14/21 1234   diphenhydrAMINE (BENADRYL) capsule 25 mg  25 mg Oral Q6H PRN Howerter, Justin B, DO       famotidine (PEPCID) tablet 20 mg  20 mg Oral Daily Howerter, Justin B, DO   20 mg at 11/14/21 7096   levothyroxine (SYNTHROID) tablet 50 mcg  50 mcg Oral Daily Howerter, Justin B, DO   50 mcg at 11/14/21 2836   morphine (PF) 2 MG/ML injection 2 mg  2 mg Intravenous Q2H PRN Alekh, Kshitiz, MD       polyethylene glycol (MIRALAX / GLYCOLAX) packet 17 g  17 g Oral Daily PRN [name], Kshitiz, MD       traMADol (ULTRAM) tablet 50 mg  50 mg Oral Q6H PRN Aline August, MD         Discharge Medications: Please see discharge summary for a list of discharge medications.  Relevant Imaging Results:  Relevant Lab Results:   Additional Information SS#[id number] [address]  Graycen Sadlon, Juliann Pulse, [place]

## 2021-11-14 NOTE — Evaluation (Signed)
Physical Therapy Evaluation Patient Details Name: Denise Wright MRN: [id number] DOB: 07/02/1950 Today's Date: 11/14/2021  History of Present Illness  "71 year old female with history of glioblastoma, acquired hypothyroidism, GERD, recently diagnosed DVTs in the bilateral lower extremities currently on Eliquis presented with diminished oral intake.  On presentation UA was suggestive of UTI.  Chest x-ray was negative for any cardiopulmonary process.  CT of the head with and without contrast showed evidence of vasogenic edema and regional mass effect associated with right to left midline shift up to 9 mm in the absence of any associated intracranial hemorrhage/acute infarct."  Clinical Impression  Pt admitted with above diagnosis. Pt currently with functional limitations due to the deficits listed below (see PT Problem List). Pt will benefit from skilled PT to increase their independence and safety with mobility to allow discharge to the venue listed below.  Pt admitted from home and daughter present and reports pt as not been OOB or performed standing in weeks.  Pt did receive w/c at home and has hospital bed.  Daughter reports progressive decline in mobility.  Pt assisted with sitting at EOB and presents with left sided deficits, left inattention, poor balance, and limited endurance.  Daughter and pt agreeable to SNF upon d/c in hopes for pt to make some improvements in deficits prior to return home.      Recommendations for follow up therapy are one component of a multi-disciplinary discharge planning process, led by the attending physician.  Recommendations may be updated based on patient status, additional functional criteria and insurance authorization.  Follow Up Recommendations Skilled nursing-short term rehab (<3 hours/day)    Assistance Recommended at Discharge Frequent or constant Supervision/Assistance  Patient can return home with the following  Two people to help with walking and/or  transfers;Two people to help with bathing/dressing/bathroom    Equipment Recommendations None recommended by PT  Recommendations for Other Services       Functional Status Assessment Patient has had a recent decline in their functional status and demonstrates the ability to make significant improvements in function in a reasonable and predictable amount of time.     Precautions / Restrictions Precautions Precautions: Fall      Mobility  Bed Mobility Overal bed mobility: Needs Assistance Bed Mobility: Supine to Sit     Supine to sit: Max assist, +2 for physical assistance     General bed mobility comments: verbal cues for self assist, pt utilized bed rail and R UE to self assist, required assist for L LE and trunk upright as well as scooting left side to EOB    Transfers                   General transfer comment: not performed as pt has not performed standing in weeks and also fatigued quickly at EOB    Ambulation/Gait                  Stairs            Wheelchair Mobility    Modified Rankin (Stroke Patients Only)       Balance Overall balance assessment: Needs assistance Sitting-balance support: Feet supported, Single extremity supported Sitting balance-Leahy Scale: Poor Sitting balance - Comments: requiring UE support, pt able to reach forward with arms to target (decreased on left) however unable to lean trunk forward or reach toward floor  Pertinent Vitals/Pain Pain Assessment Pain Assessment: No/denies pain    Home Living Family/patient expects to be discharged to:: Private residence Living Arrangements: Children Available Help at Discharge: Family;Available 24 hours/day Type of Home: House         Home Layout: One level Home Equipment: Wheelchair - manual;Hospital bed      Prior Function Prior Level of Function : Needs assist             Mobility Comments: pt has not  ambulated in weeks, has been essentially bedbound leading up to this admission ADLs Comments: requiring assist     Hand Dominance        Extremity/Trunk Assessment   Upper Extremity Assessment Upper Extremity Assessment: LUE deficits/detail LUE Deficits / Details: grossly 2/5 throughout, edematous Left hand    Lower Extremity Assessment Lower Extremity Assessment: LLE deficits/detail LLE Deficits / Details: grossly 2+/5 throughout, no ankle AROM, edematous LEs (family reports from steroids)       Communication   Communication: Other (comment) (soft spoken)  Cognition Arousal/Alertness: Awake/alert Behavior During Therapy: WFL for tasks assessed/performed Overall Cognitive Status: Within Functional Limits for tasks assessed                                          General Comments      Exercises     Assessment/Plan    PT Assessment Patient needs continued PT services  PT Problem List Decreased strength;Decreased range of motion;Decreased activity tolerance;Decreased mobility;Decreased balance;Decreased knowledge of use of DME;Decreased coordination;Obesity       PT Treatment Interventions DME instruction;Balance training;Therapeutic exercise;Functional mobility training;Therapeutic activities;Wheelchair mobility training;Patient/family education;Neuromuscular re-education    PT Goals (Current goals can be found in the Care Plan section)  Acute Rehab PT Goals PT Goal Formulation: With patient/family Time For Goal Achievement: 11/28/21 Potential to Achieve Goals: Fair    Frequency Min 2X/week     Co-evaluation               AM-PAC PT "6 Clicks" Mobility  Outcome Measure Help needed turning from your back to your side while in a flat bed without using bedrails?: Total Help needed moving from lying on your back to sitting on the side of a flat bed without using bedrails?: Total Help needed moving to and from a bed to a chair (including a  wheelchair)?: Total Help needed standing up from a chair using your arms (e.g., wheelchair or bedside chair)?: Total Help needed to walk in hospital room?: Total Help needed climbing 3-5 steps with a railing? : Total 6 Click Score: 6    End of Session   Activity Tolerance: Patient tolerated treatment well;Patient limited by fatigue Patient left: in bed;with call bell/phone within reach;with nursing/sitter in room (with nursing for bathing) Nurse Communication: Mobility status PT Visit Diagnosis: Muscle weakness (generalized) (M62.81);Adult, failure to thrive (R62.7);Other abnormalities of gait and mobility (R26.89);Hemiplegia and hemiparesis Hemiplegia - Right/Left: Left Hemiplegia - caused by:  (vasogenic edema and regional mass effect associated with right to left midline shift)    Time: 1443-1540 PT Time Calculation (min) (ACUTE ONLY): 24 min   Charges:   PT Evaluation $PT Eval Moderate Complexity: 1 Mod         Kati PT, DPT Acute Rehabilitation Services Pager: [phone] Office: [place] 11/14/2021, 1:49 PM

## 2021-11-15 ENCOUNTER — Inpatient Hospital Stay: Payer: BC Managed Care – PPO | Attending: Internal Medicine

## 2021-11-15 DIAGNOSIS — Z5111 Encounter for antineoplastic chemotherapy: Secondary | ICD-10-CM | POA: Insufficient documentation

## 2021-11-15 DIAGNOSIS — G936 Cerebral edema: Secondary | ICD-10-CM | POA: Diagnosis not present

## 2021-11-15 DIAGNOSIS — C712 Malignant neoplasm of temporal lobe: Secondary | ICD-10-CM | POA: Insufficient documentation

## 2021-11-15 MED ORDER — HYDRALAZINE HCL 20 MG/ML IJ SOLN: 10.0000 mg | INTRAMUSCULAR | Status: DC | PRN

## 2021-11-15 MED ORDER — IPRATROPIUM-ALBUTEROL 0.5-2.5 (3) MG/3ML IN SOLN: 3.0000 mL | RESPIRATORY_TRACT | Status: DC | PRN

## 2021-11-15 MED ORDER — METOPROLOL TARTRATE 5 MG/5ML IV SOLN: 5.0000 mg | INTRAVENOUS | Status: DC | PRN

## 2021-11-15 MED ORDER — GUAIFENESIN 100 MG/5ML PO LIQD: 5.0000 mL | ORAL | Status: DC | PRN

## 2021-11-15 NOTE — Progress Notes (Signed)
PROGRESS NOTE    Denise Wright  LYY:[id number] DOB: August 28, 1950 DOA: 11/10/2021 PCP: Willey Blade, MD   Brief Narrative:  71 year old female with history of glioblastoma, acquired hypothyroidism, GERD, recently diagnosed DVTs in the bilateral lower extremities currently on Eliquis presented with diminished oral intake.  On presentation UA was suggestive of UTI.  Chest x-ray was negative for any cardiopulmonary process.  CT of the head with and without contrast showed evidence of vasogenic edema and regional mass effect associated with right to left midline shift up to 9 mm in the absence of any associated intracranial hemorrhage/acute infarct.  She was started on IV Decadron and Rocephin.  Neurooncology and palliative care were consulted.  Patient understands prognosis is guarded, plans for SNF with palliative care service to follow.   Assessment & Plan:  Principal Problem:   Cerebral edema (HCC) Active Problems:   Hypothyroidism   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HCC)   Generalized anxiety disorder   Acute cystitis   GERD (gastroesophageal reflux disease)   History of DVT (deep vein thrombosis)   Obesity, Class III, BMI 40-49.9 (morbid obesity) ([place])   Physical deconditioning     Assessment and Plan: Cerebral edema with history of glioblastoma Adult failure to thrive/poor oral intake -Follows outpatient with Dr. Mickeal Skinner on oral Temodar and Decadron.  Currently on Decadron 10 mg IV. - Seen by palliative care services who will plan to continue to follow patient at the rehab - Dr. Mickeal Skinner recommending Decadron 4 mg twice daily upon discharge   Bilateral pulmonary embolism -Diagnosed in March 2023 continue Eliquis   Generalized anxiety disorder -As needed Xanax   Constipation -Bowel regimen   Hypothyroidism --continue levothyroxine   Physical deconditioning -PT/OT recommending SNF.   Morbid obesity -Outpatient follow-up     DVT  prophylaxis: Eliquis Code Status: DNR Family Communication: Patient's friend is at bedside  Status is: Inpatient Remains inpatient appropriate because: Medically stable awaiting bed at rehab    Subjective: Sleepy this morning but easily arousable and answers basic questions.  Review of Systems Otherwise negative except as per HPI, including: General: Denies fever, chills, night sweats or unintended weight loss. Resp: Denies cough, wheezing, shortness of breath. Cardiac: Denies chest pain, palpitations, orthopnea, paroxysmal nocturnal dyspnea. GI: Denies abdominal pain, nausea, vomiting, diarrhea or constipation GU: Denies dysuria, frequency, hesitancy or incontinence MS: Denies muscle aches, joint pain or swelling Neuro: Denies headache, neurologic deficits (focal weakness, numbness, tingling), abnormal gait Psych: Denies anxiety, depression, SI/HI/AVH Skin: Denies new rashes or lesions ID: Denies sick contacts, exotic exposures, travel  Examination:  General exam: Appears calm and comfortable, chronically ill-appearing Respiratory system: Clear to auscultation. Respiratory effort normal. Cardiovascular system: S1 & S2 heard, RRR. No JVD, murmurs, rubs, gallops or clicks. No pedal edema. Gastrointestinal system: Abdomen is nondistended, soft and nontender. No organomegaly or masses felt. Normal bowel sounds heard. Central nervous system: Alert and oriented. No focal neurological deficits. Extremities: Symmetric 5 x 5 power. Skin: No rashes, lesions or ulcers Psychiatry: Judgement and insight appear normal. Mood & affect appropriate.     Objective: Vitals:   11/14/21 1359 11/14/21 2058 11/15/21 0500 11/15/21 0514  BP: (!) 141/78 (!) 143/68  (!) 175/92  Pulse: 77 73  65  Resp: '18 20  19  '$ Temp: 98.9 F (37.2 C) 98.7 F (37.1 C)  98.5 F (36.9 C)  TempSrc: Oral Oral  Oral  SpO2: 100% 96%  99%  Weight:   100.9 kg   Height:  Intake/Output Summary (Last 24 hours)  at 11/15/2021 0859 Last data filed at 11/15/2021 0030 Gross per 24 hour  Intake 120 ml  Output 950 ml  Net -830 ml   Filed Weights   11/13/21 0458 11/14/21 0402 11/15/21 0500  Weight: 101.4 kg 102.7 kg 100.9 kg     Data Reviewed:   CBC: Recent Labs  Lab 11/10/21 2124 11/11/21 0522  WBC 6.4 7.0  NEUTROABS 4.9 6.4  HGB 10.6* 10.5*  HCT 32.9* 31.7*  MCV 95.6 94.1  PLT 266 094   Basic Metabolic Panel: Recent Labs  Lab 11/10/21 2124 11/11/21 0522  NA 137 138  K 4.1 4.0  CL 104 106  CO2 25 25  GLUCOSE 101* 118*  BUN 21 22  CREATININE 1.15* 0.97  CALCIUM 8.6* 8.9  MG 2.2 2.0   GFR: Estimated Creatinine Clearance: 60.6 mL/min (by C-G formula based on SCr of 0.97 mg/dL). Liver Function Tests: Recent Labs  Lab 11/10/21 2124 11/11/21 0522  AST 13* 12*  ALT 12 12  ALKPHOS 46 48  BILITOT 0.6 0.5  PROT 6.5 6.4*  ALBUMIN 3.2* 3.1*   No results for input(s): LIPASE, AMYLASE in the last 168 hours. No results for input(s): AMMONIA in the last 168 hours. Coagulation Profile: No results for input(s): INR, PROTIME in the last 168 hours. Cardiac Enzymes: No results for input(s): CKTOTAL, CKMB, CKMBINDEX, TROPONINI in the last 168 hours. BNP (last 3 results) No results for input(s): PROBNP in the last 8760 hours. HbA1C: No results for input(s): HGBA1C in the last 72 hours. CBG: No results for input(s): GLUCAP in the last 168 hours. Lipid Profile: No results for input(s): CHOL, HDL, LDLCALC, TRIG, CHOLHDL, LDLDIRECT in the last 72 hours. Thyroid Function Tests: No results for input(s): TSH, T4TOTAL, FREET4, T3FREE, THYROIDAB in the last 72 hours. Anemia Panel: No results for input(s): VITAMINB12, FOLATE, FERRITIN, TIBC, IRON, RETICCTPCT in the last 72 hours. Sepsis Labs: No results for input(s): PROCALCITON, LATICACIDVEN in the last 168 hours.  Recent Results (from the past 240 hour(s))  Urine Culture     Status: Abnormal   Collection Time: 11/09/21  7:22 AM    Specimen: Urine, Clean Catch  Result Value Ref Range Status   Specimen Description   Final    URINE, CLEAN CATCH Performed at [facility], [place] [address] [place], [place] [address]    Special Requests   Final    NONE Performed at [facility], [place] [address], [place], [place] [address]    Culture MULTIPLE SPECIES PRESENT, SUGGEST RECOLLECTION (A)  Final   Report Status 11/12/2021 FINAL  Final         Radiology Studies: No results found.      Scheduled Meds:  ALPRAZolam  0.25 mg Oral Once   apixaban  5 mg Oral BID   dexamethasone (DECADRON) injection  10 mg Intravenous Q24H   famotidine  20 mg Oral Daily   levothyroxine  50 mcg Oral Daily   Continuous Infusions:   LOS: 5 days   Time spent= 35 mins    Dontrell Stuck Arsenio Loader, MD [facility]  If 7PM-7AM, please contact night-coverage  11/15/2021, 8:59 AM

## 2021-11-15 NOTE — Progress Notes (Signed)
[place] Work  Initial Assessment   Denise Wright is a 71 y.o. year old female.  met with caregiver, her daughter, Lakelynn Severtson. Clinical Social Work was referred by medical provider for assessment of psychosocial needs.   SDOH (Social Determinants of Health) assessments performed: Yes SDOH Interventions    Flowsheet Row Most Recent Value  SDOH Interventions   Food Insecurity Interventions Intervention Not Indicated  Housing Interventions Intervention Not Indicated  Transportation Interventions [facility]), Payor Benefit       SDOH Screenings   Alcohol Screen: Not on file  Depression (PHQ2-9): Low Risk    PHQ-2 Score: 0  Financial Resource Strain: Not on file  Food Insecurity: No Food Insecurity   Worried About Charity fundraiser in the Last Year: Never true   Ran Out of Food in the Last Year: Never true  Housing: Low Risk    Last Housing Risk Score: 0  Physical Activity: Not on file  Social Connections: Not on file  Stress: Not on file  Tobacco Use: Low Risk    Smoking Tobacco Use: Never   Smokeless Tobacco Use: Never   Passive Exposure: Not on file  Transportation Needs: No Transportation Needs   Lack of Transportation (Medical): No   Lack of Transportation (Non-Medical): No     Distress Screen completed: No     View : No data to display.            Family/Social Information:  Housing Arrangement: patient lives alone [facility] at [facility] currently seeking nursing home placement. Family members/support persons in your life? Family and Friends Transportation concerns: yes.  Daughter concerned about cost of transportation from nursing home to [facility] for infusions in the future.  The nursing home charges for transportation. Employment: Unemployed and Disabled Daughter has applied for short term disability for patient.  Income source: Supported by [facility] and Friends Financial concerns: Yes, due to illness and/or loss of  work during treatment Type of concern: Secretary/administrator access concerns: no Religious or spiritual practice: Yes-Daughter stated she has faith. Services Currently in place:  None  Coping/ Adjustment to diagnosis: Patient understands treatment plan and what happens next? Daughter does understand and said patient has displayed increased confusion. Concerns about diagnosis and/or treatment:  Daughter said she was trying to be proactive with her mother's care. Patient reported stressors:  Daughter owns a daycare and is having to take time off to care for her mother. Hopes and/or priorities: Caring for her mother is [name]'s top priority. Current coping skills/ strengths: Supportive family/friends     SUMMARY: Current SDOH Barriers:  Transportation  Clinical Social Work Clinical Goal(s):  Explore community resource options for unmet needs related to:  Transportation  Interventions: Discussed common feeling and emotions when being diagnosed with cancer, and the importance of support during treatment Informed patient of the support team roles and support services at [facility] Provided Powers contact information and encouraged patient to call with any questions or concerns   Follow Up Plan:  CSW to follow up with patient's daughter to provide resources regarding transportation. Daughter verbalizes understanding of plan: Yes    Rodman Pickle Khylie Larmore, LCSW

## 2021-11-15 NOTE — TOC Progression Note (Addendum)
Transition of Care [facility]) - Progression Note    Patient Details  Name: Denise Wright MRN: [id number] Date of Birth: 04-28-1951  Transition of Care [facility]) CM/SW Contact  Ramzi Brathwaite, Juliann Pulse, RN Phone Number: 11/15/2021, 10:17 AM  Clinical Narrative: Provided patient/dtr bed choices await choice;Will check on [facility] agency as alternative d/c plan if needed-HH PT/OT/aide/CSW.  -2:40p Medi [place] for d/c-HHPT/OT/aide/csw.if patient/dtr decide on home.     1. 1.5 mi [facility] [place], [place] [address] [phone] Overall rating Much below average 2. 1.9 mi [facility] [place] [address] [place], [place] [address] [phone] Overall rating Average 3. 2 mi [facility] [address] [place], [place] [address] [phone] Overall rating Below average 4. 2.4 mi [facility] at the [place], [place] [address] [phone] Overall rating Above average 5. 2.6 mi [facility] [address] [place], [place] [address] [phone] Overall rating Much below average 6. 3.4 mi [place], [place] [address] [phone] Overall rating Much below average 7. 3.7 mi [facility] [place], [place] [address] [phone] Overall rating Much above average 8. 3.9 mi [facility] [place], [place] [address] [phone] Overall rating Much above average 9. [place] [facility] [address] [place], [place] [address] [phone] Overall rating Average 10. 4.2 mi [place] [address] [place], [place] [address] [phone] Overall rating Much  below average 11. 4.6 mi [facility] [address] [place], [place] [address] [phone] Overall rating Much below average 12. 6.3 mi [facility] [address] [place], [place] [address] [phone] Overall rating Above average 13. 9.2 mi [place], [place] [address] [phone] Overall rating Below average 14. 9.6 [place], [place] [address] [phone] Overall rating Much above average 15. 9.8 mi [facility] [address] [place], [place] [address] [phone] Overall rating Above average 16. 9.9 mi [facility] [address] [place], [place] [address] [phone] Overall rating Much above average 17. 10.7 mi [place] [facility] [address] [place], [place] [address] [phone] Overall rating Much above average 18. 13.3 [facility] [address] [place], [place] [address] [phone] Overall rating Much below average 19. 13.6 mi [facility] [address] [place], [place] [address] [phone] Overall rating Much below average 20. 14.2 mi [facility] [place], [place] [address] [phone] Overall rating Much above average 21. 14.4 [place] [address] [place], [place] [address] [phone] Overall rating Much below average 22. 15 mi [facility] [place], [place] [address] [phone] Overall rating Above average 23. 15.2 mi [place] [address] [place], [place] [address] [phone] Overall rating Above average 24. 15.3 mi The [place] [address] [place], [place] [address] [phone] Overall  rating Below average 25. 16.9 mi [facility] [place],  [address] [phone] Overall rating Average 26. 18.1 mi [place], [place] [address] [phone] Overall rating Below average 27. 18.9 mi [facility] and [place], [place] [address] [phone] Overall rating Much below average 28. 19.7 mi [facility] [place], [place] [address] [phone] Overall rating Much below average 29. 20.2 mi [address] at the [facility], [place] [address] [phone] Overall rating Much above average 30. 20.4 mi [facility] [address] [place], [place] [address] [phone] Overall rating Much below average 31. 20.4 mi [facility] [address] [place], [place] [address] [phone] Overall rating Below average 32. 20.6 [place], [place] [address] [phone] Overall rating Much above average 33. 21.2 [address] [place], [place] [address] [phone] Overall rating Below average 34. 22.7 [place] [address] [place], [place] [address] [phone] Overall rating Below average 35. 22.8 mi [facility] [address] [place], [place] [address] [phone] Overall rating Much above average 36. 23.1 mi [facility] [address] [place], [place] [address] [phone] Overall rating Below average 37. 23.5 mi [facility] [address] [place], [place] [address] [phone] Overall rating Above average 38. 23.7 [place], [place] [address] [phone] Overall rating Not available18 39. [place], [place] [address] [phone] Overall rating Much below average 40. 24.8 mi [place] [address] [place], [place] [address] [phone] Overall rating Above average To explore and dow  Expected Discharge Plan: [place] Barriers to Discharge: Continued Medical Work up  Expected Discharge Plan and Services Expected Discharge Plan: [place]   Discharge Planning Services: CM Consult Post Acute Care Choice: [place] Living arrangements for the past 2 months: Single Family Home                                       Social Determinants of Health (SDOH) Interventions    Readmission Risk Interventions    11/13/2021   10:49 AM  Readmission Risk Prevention Plan  Transportation Screening Complete  PCP or Specialist Appt within 3-5 Days Complete  HRI or [place] Complete  Social Work Consult for [place] Planning/Counseling Complete  Palliative Care Screening Complete  Medication Review Press photographer) Complete

## 2021-11-16 ENCOUNTER — Inpatient Hospital Stay: Payer: BC Managed Care – PPO

## 2021-11-16 DIAGNOSIS — G936 Cerebral edema: Secondary | ICD-10-CM | POA: Diagnosis not present

## 2021-11-16 MED ORDER — SIMETHICONE 80 MG PO CHEW
80.0000 mg | CHEWABLE_TABLET | Freq: Once | ORAL | Status: AC
  Administered 2021-11-16: 80 mg via ORAL
  Filled 2021-11-16: qty 1

## 2021-11-16 MED ORDER — PHENAZOPYRIDINE HCL 100 MG PO TABS
100.0000 mg | ORAL_TABLET | Freq: Three times a day (TID) | ORAL | Status: DC
  Administered 2021-11-16 – 2021-11-17 (×3): 100 mg via ORAL
  Filled 2021-11-16 (×6): qty 1

## 2021-11-16 NOTE — Progress Notes (Signed)
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  On presentation UA was suggestive of UTI.  Chest x-ray was negative for any cardiopulmonary process.  CT of the head with and without contrast showed evidence of vasogenic edema and regional mass effect associated with right to left midline shift up to 9 mm in the absence of any associated intracranial hemorrhage/acute infarct."    PT Comments    Patient eager to work with therapy and engaged in activities for balance at EOB. Max+2/Total assist to complete bed mobility required and posterior support sitting EOB to prevent posterior LOB. EOS repositioned pt  for comfort and family present to assist with lunch. Continue to recommend SNF for rehab, will progress as able.    Recommendations for follow up therapy are one component of a multi-disciplinary discharge planning process, led by the attending physician.  Recommendations may be updated based on patient status, additional functional criteria and insurance authorization.  Follow Up Recommendations  Skilled nursing-short term rehab (<3 hours/day)     Assistance Recommended at Discharge Frequent or constant Supervision/Assistance  Patient can return home with the following Two people to help with walking and/or transfers;Two people to help with bathing/dressing/bathroom   Equipment Recommendations  None recommended by PT    Recommendations for Other Services       Precautions / Restrictions Precautions Precautions: Fall Restrictions Weight Bearing Restrictions: No     Mobility  Bed Mobility Overal bed mobility: Needs Assistance Bed Mobility: Sidelying to Sit, Rolling, Sit to  Supine Rolling: Max assist, +2 for physical assistance, +2 for safety/equipment Sidelying to sit: Max assist, +2 for physical assistance, +2 for safety/equipment, HOB elevated   Sit to supine: +2 for safety/equipment, +2 for physical assistance, Total assist   General bed mobility comments: verbal cues for self assist to utilized bed rail with R UE, required assist for L LE and trunk upright as well as scooting left LE off EOB. Total assist to pivot hips with bed pad and return to supine. Rolling EOS to complete pericare.    Transfers                        Ambulation/Gait                   Stairs             Wheelchair Mobility    Modified Rankin (Stroke Patients Only)       Balance Overall balance assessment: Needs assistance Sitting-balance support: Bilateral upper extremity supported, Single extremity supported, Feet supported Sitting balance-Leahy Scale: Poor Sitting balance - Comments: Therapeutic activities in sitting for balance. pt completed Lt forearm prop 5x with Min assist to press up/return to midline. Pt completed 5 reps reaching contra/ipsilaterally with Rt UE and 5 reps reaching with assist for Lt UE. Postural control: Posterior lean                                  Cognition Arousal/Alertness: Awake/alert Behavior During Therapy: WFL for tasks assessed/performed (flat expressions but engages in conversation) Overall Cognitive Status: Within Functional Limits for tasks assessed  General Comments: pt has positive attitude, very willing to work        Exercises      General Comments        Pertinent Vitals/Pain      Home Living                          Prior Function            PT Goals (current goals can now be found in the care plan section) Acute Rehab PT Goals PT Goal Formulation: With patient/family Time For Goal Achievement: 11/28/21 Potential  to Achieve Goals: Fair Progress towards PT goals: Progressing toward goals    Frequency    Min 2X/week      PT Plan Current plan remains appropriate    Co-evaluation              AM-PAC PT "6 Clicks" Mobility   Outcome Measure  Help needed turning from your back to your side while in a flat bed without using bedrails?: Total Help needed moving from lying on your back to sitting on the side of a flat bed without using bedrails?: Total Help needed moving to and from a bed to a chair (including a wheelchair)?: Total Help needed standing up from a chair using your arms (e.g., wheelchair or bedside chair)?: Total Help needed to walk in hospital room?: Total Help needed climbing 3-5 steps with a railing? : Total 6 Click Score: 6    End of Session   Activity Tolerance: Patient tolerated treatment well Patient left: in bed;with call bell/phone within reach;with bed alarm set;with family/visitor present Nurse Communication: Mobility status PT Visit Diagnosis: Muscle weakness (generalized) (M62.81);Adult, failure to thrive (R62.7);Other abnormalities of gait and mobility (R26.89);Hemiplegia and hemiparesis Hemiplegia - Right/Left: Left Hemiplegia - caused by:  (vasogenic edema and regional mass effect associated with right to left midline shift)     Time: 2330-0762 PT Time Calculation (min) (ACUTE ONLY): 38 min  Charges:  $Therapeutic Activity: 38-52 mins                     Verner Mould, DPT Acute Rehabilitation Services Office [phone] Pager [phone]  11/16/21 5:00 PM

## 2021-11-16 NOTE — TOC Progression Note (Signed)
Transition of Care [facility]) - Progression Note    Patient Details  Name: Denise Wright MRN: [id number] Date of Birth: 01-25-1951  [facility]) CM/SW Contact  Lennart Pall, LCSW Phone Number: 11/16/2021, 11:05 AM  Clinical Narrative:    Pt and daughter have accepted SNF bed at [facility].  Facility is starting insurance authorization and will keep team posted when this has been confirmed and pt ready for dc.   Expected Discharge Plan: [place] Barriers to Discharge: Continued Medical Work up  Expected Discharge Plan and Services Expected Discharge Plan: [place]   Discharge Planning Services: CM Consult Post Acute Care Choice: [place] Living arrangements for the past 2 months: Single Family Home                                       Social Determinants of Health (SDOH) Interventions    Readmission Risk Interventions    11/13/2021   10:49 AM  Readmission Risk Prevention Plan  Transportation Screening Complete  PCP or Specialist Appt within 3-5 Days Complete  HRI or [place] Complete  Social Work Consult for [place] Planning/Counseling Complete  Palliative Care Screening Complete  Medication Review Press photographer) Complete

## 2021-11-16 NOTE — Progress Notes (Signed)
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  On presentation UA was suggestive of UTI.  Chest x-ray was negative for any cardiopulmonary process.  CT of the head with and without contrast showed evidence of vasogenic edema and regional mass effect associated with right to left midline shift up to 9 mm in the absence of any associated intracranial hemorrhage/acute infarct.  She was started on IV Decadron and Rocephin.  Neurooncology and palliative care were consulted.  Patient understands prognosis is guarded, plans for SNF with palliative care service to follow.   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 Mickeal Skinner on oral Temodar and Decadron.  Currently on Decadron 10 mg IV. - Seen by palliative care services who will plan to continue to follow patient at the rehab - Dr. Mickeal Skinner recommending Decadron 4 mg twice daily upon discharge   Bilateral pulmonary embolism -Diagnosed in March 2023 continue Eliquis   Generalized anxiety disorder -As needed Xanax   Constipation -Bowel regimen   Hypothyroidism --continue levothyroxine   Physical deconditioning -PT/OT recommending SNF.   Morbid obesity -Outpatient follow-up     DVT  prophylaxis: Eliquis Code Status: DNR Family Communication: family at bedside  Status is: Inpatient Remains inpatient appropriate because: Medically stable awaiting bed at rehab    Subjective: Awake and conversational this morning. No complaints.    Examination:  Constitutional: Not in acute distress. Chronically ill Respiratory: Clear to auscultation bilaterally Cardiovascular: Normal sinus rhythm, no rubs Abdomen: Nontender nondistended good bowel sounds Musculoskeletal: No edema noted Skin: surgical scar on her head  Neurologic: CN 2-12 grossly intact.  And nonfocal Psychiatric: Normal judgment and insight. Alert and oriented x 3. Normal mood.     Objective: Vitals:   11/15/21 2040 11/15/21 2131 11/16/21 0134 11/16/21 0549  BP: 125/80 128/72 139/67 (!) 147/86  Pulse: 94 95 86 88  Resp: '18 16 14 16  '$ Temp: 98.8 F (37.1 C) 98.7 F (37.1 C) 99 F (37.2 C) 98 F (36.7 C)  TempSrc: Oral Oral Oral Oral  SpO2: 98% 96% 94% 95%  Weight:  106.7 kg    Height:        Intake/Output Summary (Last 24 hours) at 11/16/2021 1249 Last data filed at 11/16/2021 0610 Gross per 24 hour  Intake 240 ml  Output 350 ml  Net -110 ml   Filed Weights   11/14/21 0402 11/15/21 0500 11/15/21 2131  Weight: 102.7 kg 100.9 kg 106.7 kg     Data Reviewed:   CBC: Recent Labs  Lab 11/10/21 2124 11/11/21 0522  WBC 6.4 7.0  NEUTROABS 4.9 6.4  HGB 10.6* 10.5*  HCT 32.9* 31.7*  MCV 95.6 94.1  PLT 266 469   Basic Metabolic Panel: Recent Labs  Lab 11/10/21 2124 11/11/21 0522  NA 137 138  K 4.1 4.0  CL  104 106  CO2 25 25  GLUCOSE 101* 118*  BUN 21 22  CREATININE 1.15* 0.97  CALCIUM 8.6* 8.9  MG 2.2 2.0   GFR: Estimated Creatinine Clearance: 62.6 mL/min (by C-G formula based on SCr of 0.97 mg/dL). Liver Function Tests: Recent Labs  Lab 11/10/21 2124 11/11/21 0522  AST 13* 12*  ALT 12 12  ALKPHOS 46 48  BILITOT 0.6 0.5  PROT 6.5 6.4*  ALBUMIN 3.2* 3.1*   No results  for input(s): "LIPASE", "AMYLASE" in the last 168 hours. No results for input(s): "AMMONIA" in the last 168 hours. Coagulation Profile: No results for input(s): "INR", "PROTIME" in the last 168 hours. Cardiac Enzymes: No results for input(s): "CKTOTAL", "CKMB", "CKMBINDEX", "TROPONINI" in the last 168 hours. BNP (last 3 results) No results for input(s): "PROBNP" in the last 8760 hours. HbA1C: No results for input(s): "HGBA1C" in the last 72 hours. CBG: No results for input(s): "GLUCAP" in the last 168 hours. Lipid Profile: No results for input(s): "CHOL", "HDL", "LDLCALC", "TRIG", "CHOLHDL", "LDLDIRECT" in the last 72 hours. Thyroid Function Tests: No results for input(s): "TSH", "T4TOTAL", "FREET4", "T3FREE", "THYROIDAB" in the last 72 hours. Anemia Panel: No results for input(s): "VITAMINB12", "FOLATE", "FERRITIN", "TIBC", "IRON", "RETICCTPCT" in the last 72 hours. Sepsis Labs: No results for input(s): "PROCALCITON", "LATICACIDVEN" in the last 168 hours.  Recent Results (from the past 240 hour(s))  Urine Culture     Status: Abnormal   Collection Time: 11/09/21  7:22 AM   Specimen: Urine, Clean Catch  Result Value Ref Range Status   Specimen Description   Final    URINE, CLEAN CATCH Performed at [facility], [place] [address], [place], [place] [address]    Special Requests   Final    NONE Performed at [facility], [place] [address], [employer], [place] [address]    Culture MULTIPLE SPECIES PRESENT, SUGGEST RECOLLECTION (A)  Final   Report Status 11/12/2021 FINAL  Final         Radiology Studies: No results found.      Scheduled Meds:  ALPRAZolam  0.25 mg Oral Once   apixaban  5 mg Oral BID   dexamethasone (DECADRON) injection  10 mg Intravenous Q24H   famotidine  20 mg Oral Daily   levothyroxine  50 mcg Oral Daily   phenazopyridine  100 mg Oral TID WC   Continuous Infusions:   LOS: 6 days   Time spent= 35 mins    Jazlene Bares  Arsenio Loader, MD [facility]  If 7PM-7AM, please contact night-coverage  11/16/2021, 12:49 PM

## 2021-11-17 DIAGNOSIS — G936 Cerebral edema: Secondary | ICD-10-CM | POA: Diagnosis not present

## 2021-11-17 MED ORDER — APIXABAN 5 MG PO TABS: 5.0000 mg | ORAL_TABLET | Freq: Two times a day (BID) | ORAL | Status: DC

## 2021-11-17 MED ORDER — PHENAZOPYRIDINE HCL 100 MG PO TABS: 100.0000 mg | ORAL_TABLET | Freq: Three times a day (TID) | ORAL | 0 refills | Status: DC

## 2021-11-17 MED ORDER — BISACODYL 5 MG PO TBEC: 10.0000 mg | DELAYED_RELEASE_TABLET | Freq: Every day | ORAL | 0 refills | Status: AC | PRN

## 2021-11-17 MED ORDER — ALPRAZOLAM 0.25 MG PO TABS
0.2500 mg | ORAL_TABLET | Freq: Every evening | ORAL | 0 refills | Status: DC | PRN
Start: 2021-11-17 — End: 2021-12-25

## 2021-11-17 MED ORDER — DEXAMETHASONE 1 MG PO TABS: 4.0000 mg | ORAL_TABLET | Freq: Two times a day (BID) | ORAL | 1 refills | Status: DC

## 2021-11-17 NOTE — TOC Transition Note (Signed)
Transition of Care [facility]) - CM/SW Discharge Note   Patient Details  Name: Denise Wright MRN: [id number] Date of Birth: May 19, 1951  Transition of Care [facility]) CM/SW Contact:  Lennart Pall, LCSW Phone Number: 11/17/2021, 2:51 PM   Clinical Narrative:    Facility has received insurance authorization and pt medically cleared for dc this afternoon to [employer].  Pt and daughter aware and agreeable.  PTAR called at 2:50pm.  RN to call report to [phone].  No further TOC needs.   Final next level of care: Skilled Nursing Facility Barriers to Discharge: Barriers Resolved   Patient Goals and CMS Choice Patient states their goals for this hospitalization and ongoing recovery are:: Rehab CMS Medicare.gov Compare Post Acute Care list provided to:: Patient Represenative (must comment) Choice offered to / list presented to : Adult Children  Discharge Placement              Patient chooses bed at: [facility] Patient to be transferred to facility by: [place] Name of family member notified: daughter Patient and family notified of of transfer: 11/17/21  Discharge Plan and Services   Discharge Planning Services: CM Consult Post Acute Care Choice: [place]          DME Arranged: N/A DME Agency: NA                  Social Determinants of Health ([place]) Interventions     Readmission Risk Interventions    11/13/2021   10:49 AM  Readmission Risk Prevention Plan  Transportation Screening Complete  PCP or Specialist Appt within 3-5 Days Complete  HRI or [place] Complete  Social Work Consult for [place] Planning/Counseling Complete  Palliative Care Screening Complete  Medication Review [employer]) Complete

## 2021-11-17 NOTE — Progress Notes (Signed)
Pt discharged to SNF. Left unit on stretcher pushed by ambulance staff. Let in stable condition.

## 2021-11-17 NOTE — Discharge Summary (Signed)
Physician Discharge Summary  Denise Wright BWG:[id number] DOB: 08-16-1950 DOA: 11/10/2021  PCP: Willey Blade, MD  Admit date: 11/10/2021 Discharge date: 11/17/2021  Admitted From: Home Disposition: Skilled nursing facility  Recommendations for Outpatient Follow-up:  Follow up with PCP in 1-2 weeks Please obtain BMP/CBC in one week your next doctors visit.  Bowel regimen and Pyridium prescribed. Take Eliquis 5 mg twice daily.  Xanax as needed Dexamethasone 4 mg twice daily as recommended by oncology   Discharge Condition: Stable CODE STATUS: DNR Diet recommendation: Regular  Brief/Interim Summary: 71 year old female with history of glioblastoma, acquired hypothyroidism, GERD, recently diagnosed DVTs in the bilateral lower extremities currently on Eliquis presented with diminished oral intake.  On presentation UA was suggestive of UTI.  Chest x-ray was negative for any cardiopulmonary process.  CT of the head with and without contrast showed evidence of vasogenic edema and regional mass effect associated with right to left midline shift up to 9 mm in the absence of any associated intracranial hemorrhage/acute infarct.  She was started on IV Decadron and Rocephin.  Neurooncology and palliative care were consulted.  Patient understands prognosis is guarded, plans for SNF with palliative care service to follow.     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 Mickeal Skinner on oral Temodar and Decadron.  Patient was seen by  palliative care service will continue to follow at rehab.  Dr. Mickeal Skinner recommended Decadron 4 mg twice daily upon discharge   Bilateral pulmonary embolism -Diagnosed in March 2023 continue Eliquis   Generalized anxiety disorder -As needed Xanax   Constipation -Bowel regimen   Hypothyroidism --continue levothyroxine   Physical deconditioning -PT/OT recommending SNF.   Morbid obesity -Outpatient follow-up           Body mass index is 42.34 kg/m.       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)   Generalized anxiety disorder   Acute cystitis   GERD (gastroesophageal reflux disease)   History of DVT (deep vein thrombosis)   Obesity, Class III, BMI 40-49.9 (morbid obesity) ([place])   Physical deconditioning      Consultations: Oncology Palliative care service  Subjective: Feels well no complaints  Discharge Exam: Vitals:   11/16/21 2159 11/17/21 0512  BP: (!) 145/71 (!) 147/79  Pulse: 90 82  Resp: 16 14  Temp: 98.5 F (36.9 C) 98.3 F (36.8 C)  SpO2: 96% 95%   Vitals:   11/16/21 0549 11/16/21 1815 11/16/21 2159 11/17/21 0512  BP: (!) 147/86 123/75 (!) 145/71 (!) 147/79  Pulse: 88 96 90 82  Resp: '16  16 14  '$ Temp: 98 F (36.7 C) 97.9 F (36.6 C) 98.5 F (36.9 C) 98.3 F (36.8 C)  TempSrc: Oral Oral Oral Oral  SpO2: 95% 97% 96% 95%  Weight:    106.7 kg  Height:        General: Pt is alert, awake, not in acute distress, appears chronically ill Cardiovascular:  RRR, S1/S2 +, no rubs, no gallops Respiratory: CTA bilaterally, no wheezing, no rhonchi Abdominal: Soft, NT, ND, bowel sounds + Extremities: no edema, no cyanosis Scar on forehead from previous surgery  Discharge Instructions   Allergies as of 11/17/2021       Reactions   Fexofenadine Hives   Penicillins Itching, Other (See Comments)   Pulling sensation in body         Medication List     TAKE these medications    acetaminophen 325 MG tablet Commonly known as: TYLENOL Take 2 tablets (650 mg total) by mouth every 6 (six) hours as needed for mild pain (or Fever >/= 101).   ALPRAZolam 0.25 MG tablet Commonly known as: XANAX Take 1 tablet (0.25 mg total) by mouth at bedtime as needed for anxiety or sleep. What changed:  when to take this reasons to take this   apixaban 5 MG Tabs tablet Commonly known as: ELIQUIS Take 1 tablet (5 mg total) by mouth 2 (two) times daily. What changed:  medication strength how much to take Another medication with the same name was removed. Continue taking this medication, and follow the directions you see here.   b complex vitamins capsule Take 1 capsule by mouth daily.   bisacodyl 5 MG EC tablet Commonly known as: DULCOLAX Take 2 tablets (10 mg total) by mouth daily as needed for moderate constipation.   cetirizine 10 MG tablet Commonly known as: ZYRTEC Take 10 mg by mouth daily.   cholecalciferol 1000 units tablet Commonly known as: VITAMIN D Take 2,000 Units by mouth daily.   dexamethasone 1 MG tablet Commonly known as: DECADRON Take 4 tablets (4 mg total) by mouth 2 (two) times daily with a meal. What changed:  how much to take when to take this   docusate sodium 100 MG capsule Commonly known as: COLACE Take 1 capsule (100 mg total) by mouth 2 (two) times daily.   famotidine 20 MG tablet Commonly known as: PEPCID Take 1 tablet (20 mg total) by mouth daily.   levothyroxine 50 MCG tablet Commonly known as: SYNTHROID Take 1 tablet (50 mcg total) by mouth daily.   multivitamin with minerals tablet Take 1 tablet by mouth daily.   ondansetron 8 MG tablet Commonly known as: Zofran Take 1 tablet by mouth 2 times daily as needed (nausea and vomiting). May take 30-60 minutes prior to Temodar administration if nausea/vomiting occurs.   phenazopyridine 100 MG tablet Commonly known as:  PYRIDIUM Take 1 tablet (100 mg total) by mouth 3 (three) times daily with meals.        Contact information for after-discharge care     Destination     [facility] Preferred SNF .   Service: Skilled Nursing Contact information: [address] [place] [address] [phone]                    Allergies  Allergen Reactions   Fexofenadine Hives   Penicillins Itching and Other (See Comments)    Pulling sensation in body    You were cared for by a hospitalist during your hospital stay. If you have any questions about your discharge medications or the care you received while you were in the hospital after you are discharged, you can call the unit and asked to speak with the hospitalist on call if the hospitalist that took care of you is not available. Once you are discharged, your primary care physician will handle any further medical  issues. Please note that no refills for any discharge medications will be authorized once you are discharged, as it is imperative that you return to your primary care physician (or establish a relationship with a primary care physician if you do not have one) for your aftercare needs so that they can reassess your need for medications and monitor your lab values.   Procedures/Studies: CT HEAD W & WO CONTRAST (5MM)  Result Date: 11/10/2021 CLINICAL DATA:  Initial evaluation for brain metastases suspected. EXAM: CT HEAD WITHOUT AND WITH CONTRAST TECHNIQUE: Contiguous axial images were obtained from the base of the skull through the vertex without and with intravenous contrast. RADIATION DOSE REDUCTION: This exam was performed according to the departmental dose-optimization program which includes automated exposure control, adjustment of the mA and/or kV according to patient size and/or use of iterative reconstruction technique. CONTRAST:  63m OMNIPAQUE IOHEXOL 300 MG/ML  SOLN COMPARISON:  Prior MRI from 10/06/2021.  FINDINGS: Brain: Postoperative changes from prior right-sided craniotomy for tumor resection again seen. Underlying dural thickening and enhancement, similar to previous. Prominent peripheral enhancement again seen about the resection cavity, similar to previous. Additionally, the overall resection cavity appears increased in size from previous, now measuring up to 4.3 cm (series 4, image 14, previously 3.4 cm when measured in a similar fashion on prior exam. Extensive surrounding vasogenic edema with regional mass effect appears somewhat worsened. Worsened mass effect on the right lateral ventricle which is largely effaced. Associated right-to-left shift now measures up to 9 mm. No overt ventricular trapping. Basilar cisterns remain patent. No other visible distant lesion or abnormal enhancement visible by CT. No acute intracranial hemorrhage or superimposed large vessel territory infarct. No other extra-axial fluid collection. Vascular: No hyperdense vessel prior to contrast administration. Normal intravascular enhancement seen following contrast administration. Skull: Prior right craniotomy. No acute scalp soft tissue abnormality. Calvarium otherwise intact. Sinuses/Orbits: Right gaze noted. Paranasal sinuses are clear. No mastoid effusion. Other: None. IMPRESSION: 1. Postoperative changes from prior right-sided craniotomy for tumor resection. Prominent peripheral enhancement again seen about the resection cavity, with overall increased size of the cavity as compared to previous. Findings consistent with progressive disease. 2. Worsened surrounding vasogenic edema and regional mass effect. Associated right-to-left midline shift now measures up to 9 mm. No ventricular trapping. Electronically Signed   By: BJeannine BogaM.D.   On: 11/10/2021 23:15   DG Chest 2 View  Result Date: 11/10/2021 CLINICAL DATA:  Decreased O2 sats. EXAM: CHEST - 2 VIEW COMPARISON:  Chest x-ray 07/13/2011. FINDINGS: The heart is  enlarged, unchanged. There is mild elevation of the right hemidiaphragm. There is no definite focal lung infiltrate, pleural effusion or pneumothorax. No acute fractures are seen. IMPRESSION: 1. Cardiomegaly. 2. No evidence for pneumonia or edema. Electronically Signed   By: ARonney AstersM.D.   On: 11/10/2021 19:38   CT Angio Chest PE W and/or Wo Contrast  Result Date: 10/26/2021 CLINICAL DATA:  Shortness of breath, known DVT EXAM: CT ANGIOGRAPHY CHEST WITH CONTRAST TECHNIQUE: Multidetector CT imaging of the chest was performed using the standard protocol during bolus administration of intravenous contrast. Multiplanar CT image reconstructions and MIPs were obtained to evaluate the vascular anatomy. RADIATION DOSE REDUCTION: This exam was performed according to the departmental dose-optimization program which includes automated exposure control, adjustment of the mA and/or kV according to patient size and/or use of iterative reconstruction technique. CONTRAST:  721mOMNIPAQUE IOHEXOL 350 MG/ML SOLN COMPARISON:  CT chest dated 06/30/2021 FINDINGS: Cardiovascular: Satisfactory opacification  of the bilateral pulmonary arteries to the lobar level. Lobar pulmonary emboli in the right upper and lower lobes (series 4/images 45 and 68). Segmental/subsegmental pulmonary emboli in the left lower lobe (series 4/images 64 and 81). Overall clot burden is small. No evidence of right heart strain. Mild cardiomegaly. No pericardial effusion. No evidence thoracic aortic aneurysm. Atherosclerotic calcifications of the descending thoracic aorta. Mediastinum/Nodes: No suspicious mediastinal lymphadenopathy. Visualized thyroid is unremarkable. Lungs/Pleura: Evaluation of the lung parenchyma is constrained by respiratory motion. Within that constraint, there are no suspicious pulmonary nodules. Volume loss in the left hemithorax. No focal consolidation. No pleural effusion or pneumothorax. Upper Abdomen: Visualized upper abdomen is  notable for scattered hepatic cysts. Musculoskeletal: Visualized osseous structures are within normal limits. Review of the MIP images confirms the above findings. IMPRESSION: Lobar and segmental pulmonary emboli, as above. Overall clot burden is small. No evidence of right heart strain. These results were called by telephone at the time of interpretation on 10/26/2021 at 12:31 am to provider [facility] , who verbally acknowledged these results. Aortic Atherosclerosis (ICD10-I70.0). Electronically Signed   By: Julian Hy M.D.   On: 10/26/2021 00:35   VAS [place] LOWER EXTREMITY VENOUS (DVT)  Result Date: 10/25/2021  Lower Venous DVT Study Patient Name:  Denise Wright  Date of Exam:   10/25/2021 Medical Rec #: [id number]        Accession #:    [id number] Date of Birth: 10-02-1950        Patient Gender: F Patient Age:   62 years Exam Location:  [facility] Procedure:      VAS [place] LOWER EXTREMITY VENOUS (DVT) Referring Phys: Alroy Dust VASLOW --------------------------------------------------------------------------------  Indications: Edema, and Pain.  Risk Factors: Cancer. Limitations: Body habitus, poor ultrasound/tissue interface and patient pain intolerance. Comparison Study: No previous exams Performing Technologist: Jody Hill RVT, RDMS  Examination Guidelines: A complete evaluation includes B-mode imaging, spectral Doppler, color Doppler, and power Doppler as needed of all accessible portions of each vessel. Bilateral testing is considered an integral part of a complete examination. Limited examinations for reoccurring indications may be performed as noted. The reflux portion of the exam is performed with the patient in reverse Trendelenburg.  +--------+---------------+---------+-----------+----------+--------------------+ RIGHT   CompressibilityPhasicitySpontaneityPropertiesThrombus Aging       +--------+---------------+---------+-----------+----------+--------------------+ CFV     None            Yes      Yes                  Age Indeterminate    +--------+---------------+---------+-----------+----------+--------------------+ SFJ     None                                         Age Indeterminate    +--------+---------------+---------+-----------+----------+--------------------+ FV Prox None           No       No                   Age Indeterminate    +--------+---------------+---------+-----------+----------+--------------------+ FV Mid                                               Not visualized       +--------+---------------+---------+-----------+----------+--------------------+ FV  Not visualized       Distal                                                                    +--------+---------------+---------+-----------+----------+--------------------+ PFV                    Yes      Yes                  patent by                                                                 color/doppler        +--------+---------------+---------+-----------+----------+--------------------+ POP     Partial        Yes      Yes                  Age Indeterminate    +--------+---------------+---------+-----------+----------+--------------------+ PTV                                                  Not visualized       +--------+---------------+---------+-----------+----------+--------------------+ PERO                                                 Not visualized       +--------+---------------+---------+-----------+----------+--------------------+ EIV                    Yes      Yes                  patent by                                                                 color/doppler        +--------+---------------+---------+-----------+----------+--------------------+ External iliac vein not well visualized, it is patent by color and doppler.  Right Technical Findings: Not  visualized segments include Mid/distal femoral, peroneal, and posterior tibial veins.  +---------+---------------+---------+-----------+----------+------------------+ LEFT     CompressibilityPhasicitySpontaneityPropertiesThrombus Aging     +---------+---------------+---------+-----------+----------+------------------+ CFV      Full           Yes      Yes                                     +---------+---------------+---------+-----------+----------+------------------+ SFJ      Full                                                            +---------+---------------+---------+-----------+----------+------------------+  FV Prox  Full           Yes      Yes                                     +---------+---------------+---------+-----------+----------+------------------+ FV Mid   Full           Yes      Yes                                     +---------+---------------+---------+-----------+----------+------------------+ FV DistalNone           No       No                   Age Indeterminate  +---------+---------------+---------+-----------+----------+------------------+ PFV      Full                                                            +---------+---------------+---------+-----------+----------+------------------+ POP      None           No       No                   Age Indeterminate  +---------+---------------+---------+-----------+----------+------------------+ PTV                     No       No                   one of paired PTVs +---------+---------------+---------+-----------+----------+------------------+ PERO     None           No       No                   Age Indeterminate  +---------+---------------+---------+-----------+----------+------------------+ Posterior tibials veins visualized by color/doppler only - unable to visualize for compression.    Summary: BILATERAL: -No evidence of popliteal cyst, bilaterally. RIGHT: - Findings  consistent with age indeterminate deep vein thrombosis involving the right common femoral vein, SF junction, right femoral vein, and right popliteal vein. - Portions of this examination were limited- see technologist comments above.  LEFT: - Findings consistent with age indeterminate deep vein thrombosis involving the left femoral vein, left popliteal vein, left posterior tibial veins, and left peroneal veins. - Portions of this examination were limited- see technologist comments above.  *See table(s) above for measurements and observations. Electronically signed by Jamelle Haring on 10/25/2021 at 9:16:06 PM.    Final      The results of significant diagnostics from this hospitalization (including imaging, microbiology, ancillary and laboratory) are listed below for reference.     Microbiology: Recent Results (from the past 240 hour(s))  Urine Culture     Status: Abnormal   Collection Time: 11/09/21  7:22 AM   Specimen: Urine, Clean Catch  Result Value Ref Range Status   Specimen Description   Final    URINE, CLEAN CATCH Performed at [facility], [place] [address] [place], [place] [address]    Special Requests   Final    NONE Performed at [facility], [place] [address] [place], [place] [address]    Culture MULTIPLE SPECIES PRESENT, SUGGEST RECOLLECTION (A)  Final   Report Status 11/12/2021 FINAL  Final     Labs: BNP (last 3 results) Recent Labs    11/10/21 2124  BNP 93.7   Basic Metabolic Panel: Recent Labs  Lab 11/10/21 2124 11/11/21 0522  NA 137 138  K 4.1 4.0  CL 104 106  CO2 25 25  GLUCOSE 101* 118*  BUN 21 22  CREATININE 1.15* 0.97  CALCIUM 8.6* 8.9  MG 2.2 2.0   Liver Function Tests: Recent Labs  Lab 11/10/21 2124 11/11/21 0522  AST 13* 12*  ALT 12 12  ALKPHOS 46 48  BILITOT 0.6 0.5  PROT 6.5 6.4*  ALBUMIN 3.2* 3.1*   No results for input(s): "LIPASE", "AMYLASE" in the last 168 hours. No results for input(s): "AMMONIA"  in the last 168 hours. CBC: Recent Labs  Lab 11/10/21 2124 11/11/21 0522  WBC 6.4 7.0  NEUTROABS 4.9 6.4  HGB 10.6* 10.5*  HCT 32.9* 31.7*  MCV 95.6 94.1  PLT 266 255   Cardiac Enzymes: No results for input(s): "CKTOTAL", "CKMB", "CKMBINDEX", "TROPONINI" in the last 168 hours. BNP: Invalid input(s): "POCBNP" CBG: No results for input(s): "GLUCAP" in the last 168 hours. D-Dimer No results for input(s): "DDIMER" in the last 72 hours. Hgb A1c No results for input(s): "HGBA1C" in the last 72 hours. Lipid Profile No results for input(s): "CHOL", "HDL", "LDLCALC", "TRIG", "CHOLHDL", "LDLDIRECT" in the last 72 hours. Thyroid function studies No results for input(s): "TSH", "T4TOTAL", "T3FREE", "THYROIDAB" in the last 72 hours.  Invalid input(s): "FREET3" Anemia work up No results for input(s): "VITAMINB12", "FOLATE", "FERRITIN", "TIBC", "IRON", "RETICCTPCT" in the last 72 hours. Urinalysis    Component Value Date/Time   COLORURINE YELLOW 11/10/2021 2105   APPEARANCEUR CLEAR 11/10/2021 2105   LABSPEC 1.015 11/10/2021 2105   PHURINE 6.0 11/10/2021 2105   GLUCOSEU NEGATIVE 11/10/2021 2105   HGBUR SMALL (A) 11/10/2021 2105   BILIRUBINUR NEGATIVE 11/10/2021 2105   KETONESUR NEGATIVE 11/10/2021 2105   PROTEINUR NEGATIVE 11/10/2021 2105   NITRITE NEGATIVE 11/10/2021 2105   LEUKOCYTESUR NEGATIVE 11/10/2021 2105   Sepsis Labs Recent Labs  Lab 11/10/21 2124 11/11/21 0522  WBC 6.4 7.0   Microbiology Recent Results (from the past 240 hour(s))  Urine Culture     Status: Abnormal   Collection Time: 11/09/21  7:22 AM   Specimen: Urine, Clean Catch  Result Value Ref Range Status   Specimen Description   Final    URINE, CLEAN CATCH Performed at [facility], [place] [address], [place], [place] [address]    Special Requests   Final    NONE Performed at [facility], [place] [address], [place], [place] [address]    Culture MULTIPLE SPECIES  PRESENT, SUGGEST RECOLLECTION (A)  Final   Report Status 11/12/2021 FINAL  Final     Time coordinating discharge:  I have spent 35 minutes face to face with the patient and on the ward discussing the patients care, assessment, plan and disposition with other care givers. >50% of the time was devoted counseling the patient about the risks and benefits of treatment/Discharge disposition and coordinating care.   SIGNED:   Damita Lack, MD  [facility] 11/17/2021, 10:26 AM   If 7PM-7AM, please contact night-coverage

## 2021-11-21 ENCOUNTER — Inpatient Hospital Stay: Payer: BC Managed Care – PPO

## 2021-11-21 ENCOUNTER — Encounter: Payer: Self-pay | Admitting: Nurse Practitioner

## 2021-11-21 ENCOUNTER — Inpatient Hospital Stay (HOSPITAL_BASED_OUTPATIENT_CLINIC_OR_DEPARTMENT_OTHER): Payer: BC Managed Care – PPO | Admitting: Internal Medicine

## 2021-11-21 ENCOUNTER — Other Ambulatory Visit: Payer: Self-pay

## 2021-11-21 ENCOUNTER — Inpatient Hospital Stay (HOSPITAL_BASED_OUTPATIENT_CLINIC_OR_DEPARTMENT_OTHER): Payer: BC Managed Care – PPO | Admitting: Nurse Practitioner

## 2021-11-21 VITALS — BP 133/76 | HR 83 | Temp 97.5°F | Resp 18

## 2021-11-21 DIAGNOSIS — C719 Malignant neoplasm of brain, unspecified: Secondary | ICD-10-CM

## 2021-11-21 DIAGNOSIS — R531 Weakness: Secondary | ICD-10-CM

## 2021-11-21 DIAGNOSIS — Z515 Encounter for palliative care: Secondary | ICD-10-CM | POA: Diagnosis not present

## 2021-11-21 DIAGNOSIS — Z7189 Other specified counseling: Secondary | ICD-10-CM | POA: Diagnosis not present

## 2021-11-21 DIAGNOSIS — C712 Malignant neoplasm of temporal lobe: Secondary | ICD-10-CM | POA: Diagnosis not present

## 2021-11-21 DIAGNOSIS — K5900 Constipation, unspecified: Secondary | ICD-10-CM | POA: Diagnosis not present

## 2021-11-21 DIAGNOSIS — Z5111 Encounter for antineoplastic chemotherapy: Secondary | ICD-10-CM | POA: Diagnosis present

## 2021-11-21 LAB — CMP (CANCER CENTER ONLY)
ALT: 15 U/L (ref 0–44)
AST: 11 U/L — ABNORMAL LOW (ref 15–41)
Albumin: 3.3 g/dL — ABNORMAL LOW (ref 3.5–5.0)
Alkaline Phosphatase: 70 U/L (ref 38–126)
Anion gap: 6 (ref 5–15)
BUN: 28 mg/dL — ABNORMAL HIGH (ref 8–23)
CO2: 27 mmol/L (ref 22–32)
Calcium: 9 mg/dL (ref 8.9–10.3)
Chloride: 108 mmol/L (ref 98–111)
Creatinine: 1 mg/dL (ref 0.44–1.00)
GFR, Estimated: >60 mL/min (ref >60)
Glucose, Bld: 186 mg/dL — ABNORMAL HIGH (ref 70–99)
Potassium: 4.2 mmol/L (ref 3.5–5.1)
Sodium: 141 mmol/L (ref 135–145)
Total Bilirubin: 0.4 mg/dL (ref 0.3–1.2)
Total Protein: 6.3 g/dL — ABNORMAL LOW (ref 6.5–8.1)

## 2021-11-21 LAB — CBC WITH DIFFERENTIAL (CANCER CENTER ONLY)
Abs Immature Granulocytes: 0.04 10*3/uL (ref 0.00–0.07)
Basophils Absolute: 0 10*3/uL (ref 0.0–0.1)
Basophils Relative: 0 %
Eosinophils Absolute: 0 10*3/uL (ref 0.0–0.5)
Eosinophils Relative: 0 %
HCT: 35.9 % — ABNORMAL LOW (ref 36.0–46.0)
Hemoglobin: 12 g/dL (ref 12.0–15.0)
Immature Granulocytes: 1 %
Lymphocytes Relative: 4 %
Lymphs Abs: 0.4 10*3/uL — ABNORMAL LOW (ref 0.7–4.0)
MCH: 31.4 pg (ref 26.0–34.0)
MCHC: 33.4 g/dL (ref 30.0–36.0)
MCV: 94 fL (ref 80.0–100.0)
Monocytes Absolute: 0.3 10*3/uL (ref 0.1–1.0)
Monocytes Relative: 4 %
Neutro Abs: 8 10*3/uL — ABNORMAL HIGH (ref 1.7–7.7)
Neutrophils Relative %: 91 %
Platelet Count: 289 10*3/uL (ref 150–400)
RBC: 3.82 MIL/uL — ABNORMAL LOW (ref 3.87–5.11)
RDW: 13.5 % (ref 11.5–15.5)
WBC Count: 8.7 10*3/uL (ref 4.0–10.5)
nRBC: 0 % (ref 0.0–0.2)

## 2021-11-21 MED ORDER — POLYETHYLENE GLYCOL 3350 17 G PO PACK: 17.0000 g | PACK | Freq: Every day | ORAL | 0 refills | Status: AC

## 2021-11-21 MED ORDER — SODIUM CHLORIDE 0.9 % IV SOLN: Freq: Once | INTRAVENOUS | Status: AC

## 2021-11-21 MED ORDER — SENNA 8.6 MG PO TABS: 1.0000 | ORAL_TABLET | Freq: Every day | ORAL | 0 refills | Status: AC

## 2021-11-21 MED ORDER — SODIUM CHLORIDE 0.9 % IV SOLN
10.0000 mg/kg | Freq: Once | INTRAVENOUS | Status: AC
  Administered 2021-11-21: 1000 mg via INTRAVENOUS
  Filled 2021-11-21: qty 32

## 2021-11-21 NOTE — Progress Notes (Signed)
Patient is ok to proceed with first Avastin treatment 11/21/2021 with current labs and vital signs.  [place] with no Urine Protein today.

## 2021-11-21 NOTE — Progress Notes (Signed)
[facility] [place], [place] [address] [phone]   Interval Evaluation  Date of Service: 11/21/21 Patient Name: Denise Wright Patient MRN: [id number] Patient DOB: [birth year] Provider: Ventura Sellers, MD  Identifying Statement:  Denise Wright is a 71 y.o. female with right temporal glioblastoma   Oncologic History: Oncology History  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[place])  07/06/2021 Surgery   Right temporal craniotomy, resection with Dr. Reatha Armour; path is glioblastoma [id number]   08/07/2021 - 09/15/2021 Radiation Therapy   IMRT and concurrent Temodar 77m/m2   10/29/2021 -  Chemotherapy   Patient is on Treatment Plan : BRAIN GLIOBLASTOMA Consolidation Temozolomide Days 1-5 q28 Days      11/13/2021 -  Chemotherapy   Patient is on Treatment Plan : BRAIN GBM Bevacizumab 14d x 6 cycles       Biomarkers:  MGMT Unknown.  IDH 1/2 Wild type.  EGFR Unknown  TERT Unknown   Interval History: Denise RosenburgNorcott presents today for follow up after recent hospitalization for UTI, altered mental status.  She continues to experience dense left sided weakness as prior.  Swelling in legs has improved modestly.  Remains incontinent of urine in pads.  Not standing or walking on own.  No issues with Eliquis.  Fatigue continues to be very persistent and problematic.  Denies seizures or headaches today.  Current decadron dose 448mdaily.  H+P (07/20/21) Patient presented to medical attention last month with several days, weeks of progressive mental status changes.  Family noticed she was confused and disoriented over normal conversation and activities of daily living.  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  CNS imaging demonstrated an enhancing mass in the right temporal lobe; she underwent craniotomy, resection with Dr. DaReatha Armourn 07/06/21, which demonstrated  glioblastoma.  Following surgery she experienced worsening left sided weakness, which prompted rehabilitation admission.  At present, she is doing some walking on her own, albeit slowly.  Left arm and hand have improved, she is starting occupational therapy this week.  Medications: Current Outpatient Medications on File Prior to Visit  Medication Sig Dispense Refill   acetaminophen (TYLENOL) 325 MG tablet Take 2 tablets (650 mg total) by mouth every 6 (six) hours as needed for mild pain (or Fever >/= 101). (Patient not taking: Reported on 11/10/2021)     ALPRAZolam (XANAX) 0.25 MG tablet Take 1 tablet (0.25 mg total) by mouth at bedtime as needed for anxiety or sleep. 30 tablet 0   apixaban (ELIQUIS) 5 MG TABS tablet Take 1 tablet (5 mg total) by mouth 2 (two) times daily. 60 tablet    b complex vitamins capsule Take 1 capsule by mouth daily.     bisacodyl (DULCOLAX) 5 MG EC tablet Take 2 tablets (10 mg total) by mouth daily as needed for moderate constipation. 30 tablet 0   cetirizine (ZYRTEC) 10 MG tablet Take 10 mg by mouth daily.     cholecalciferol (VITAMIN D) 1000 UNITS tablet Take 2,000 Units by mouth daily.     dexamethasone (DECADRON) 1 MG tablet Take 4 tablets (4 mg total) by mouth 2 (two) times daily with a meal. 60 tablet 1   docusate sodium (COLACE) 100 MG capsule Take 1 capsule (100 mg total) by mouth 2 (two) times daily. 10 capsule 0   famotidine (PEPCID) 20 MG tablet Take 1 tablet (20 mg total) by mouth daily. 30 tablet 2  levothyroxine (SYNTHROID) 50 MCG tablet Take 1 tablet (50 mcg total) by mouth daily. 30 tablet 0   Multiple Vitamins-Minerals (MULTIVITAMIN WITH MINERALS) tablet Take 1 tablet by mouth daily.     ondansetron (ZOFRAN) 8 MG tablet Take 1 tablet by mouth 2 times daily as needed (nausea and vomiting). May take 30-60 minutes prior to Temodar administration if nausea/vomiting occurs. (Patient not taking: Reported on 11/10/2021) 30 tablet 1   phenazopyridine (PYRIDIUM) 100  MG tablet Take 1 tablet (100 mg total) by mouth 3 (three) times daily with meals. 10 tablet 0   No current facility-administered medications on file prior to visit.    Allergies:  Allergies  Allergen Reactions   Fexofenadine Hives   Penicillins Itching and Other (See Comments)    Pulling sensation in body   Past Medical History:  Past Medical History:  Diagnosis Date   Glioblastoma ([place])    Hyperlipidemia    Hypertension    Pneumonia    Thyroid disease    Past Surgical History:  Past Surgical History:  Procedure Laterality Date   ABDOMINAL HYSTERECTOMY  7867   APPLICATION OF CRANIAL NAVIGATION Right 07/06/2021   Procedure: APPLICATION OF CRANIAL NAVIGATION;  Surgeon: Karsten Ro, DO;  Location: [facility];  Service: Neurosurgery;  Laterality: Right;   CRANIOTOMY Right 07/06/2021   Procedure: CRANIOTOMY TUMOR EXCISION;  Surgeon: Karsten Ro, DO;  Location: [facility];  Service: Neurosurgery;  Laterality: Right;   TONSILLECTOMY  1965   TOOTH EXTRACTION     Social History:  Social History   Socioeconomic History   Marital status: Divorced    Spouse name: Not on file   Number of children: 3   Years of education: 20   Highest education level: Not on file  Occupational History   Occupation: Masters degree in Psychologist, counselling: [employer]   Occupation: Working on Engineer, maintenance   Occupation: Training and development officer of [employer]  Tobacco Use   Smoking status: Never   Smokeless tobacco: Never  Vaping Use   Vaping Use: Never used  Substance and Sexual Activity   Alcohol use: No   Drug use: No   Sexual activity: Yes    Birth control/protection: Surgical  Other Topics Concern   Not on file  Social History Narrative   Divorced, lives in [place] alone.  Independent of ADLs.   Social Determinants of Health   Financial Resource Strain: Not on file  Food Insecurity: No Food Insecurity (11/15/2021)   Hunger Vital Sign     Worried About Running Out of Food in the Last Year: Never true    Ran Out of Food in the Last Year: Never true  Transportation Needs: No Transportation Needs (11/15/2021)   PRAPARE - Hydrologist (Medical): No    Lack of Transportation (Non-Medical): No  Physical Activity: Not on file  Stress: Not on file  Social Connections: Not on file  Intimate Partner Violence: Not on file   Family History:  Family History  Problem Relation Age of Onset   Diabetes Mother    Hypertension Mother    Hypertension Father    Leukemia Father    Stroke Father    Cancer Brother    Pneumonia Brother     Review of Systems: Constitutional: Doesn't report fevers, chills or abnormal weight loss Eyes: Doesn't report blurriness of vision Ears, nose, mouth, throat, and face: Doesn't report sore throat Respiratory: Doesn't report  cough, dyspnea or wheezes Cardiovascular: Doesn't report palpitation, chest discomfort  Gastrointestinal: +constipation GU: Doesn't report incontinence Skin: Doesn't report skin rashes Neurological: Per HPI Musculoskeletal: Doesn't report joint pain Behavioral/Psych: Doesn't report anxiety  Physical Exam: Vitals:   11/21/21 1222  BP: 133/76  Pulse: 83  Resp: 18  Temp: (!) 97.5 F (36.4 C)  SpO2: 98%    KPS: 60 ECOG 1 General: wheelchair bound Head: Normal EENT: No conjunctival injection or scleral icterus.  Lungs: Resp effort normal Cardiac: Regular rate Abdomen: Non-distended abdomen Skin: normal Extremities: 2+ dependent edema R>L  Neurologic Exam: Mental Status: Awake, alert, attentive to examiner. Oriented to self and environment. Language is fluent with intact comprehension.  Cranial Nerves: Visual acuity is grossly normal. Visual fields are full. Extra-ocular movements intact. No ptosis. Face is symmetric Motor: Tone and bulk are normal. Power is 4/5 in left arm and leg, asterixis noted with posturing. Reflexes are symmetric, no  pathologic reflexes present.  Sensory: Intact to light touch Gait: Hemiparetic  Labs: I have reviewed the data as listed    Component Value Date/Time   NA 141 11/21/2021 1122   K 4.2 11/21/2021 1122   CL 108 11/21/2021 1122   CO2 27 11/21/2021 1122   GLUCOSE 186 (H) 11/21/2021 1122   BUN 28 (H) 11/21/2021 1122   CREATININE 1.00 11/21/2021 1122   CALCIUM 9.0 11/21/2021 1122   PROT 6.3 (L) 11/21/2021 1122   ALBUMIN 3.3 (L) 11/21/2021 1122   AST 11 (L) 11/21/2021 1122   ALT 15 11/21/2021 1122   ALKPHOS 70 11/21/2021 1122   BILITOT 0.4 11/21/2021 1122   GFRNONAA >60 11/21/2021 1122   GFRAA 70 (L) 07/17/2011 0311   Lab Results  Component Value Date   WBC 8.7 11/21/2021   NEUTROABS 8.0 (H) 11/21/2021   HGB 12.0 11/21/2021   HCT 35.9 (L) 11/21/2021   MCV 94.0 11/21/2021   PLT 289 11/21/2021    Assessment/Plan 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H[facility])  Goals of care, counseling/discussion  Ivelisse Culverhouse Shroff is clinically stable today, now s/p admission and treatment for urethritis.    Labs are within normal limits, she is cleared to dose first cycle of avastin today.    Side effects of avastin were reviewed, including hypertension, bleeding clotting, wound healing impairment.    Decadron should decrease to 79m daily.  Sennakot can be added (daily) for constipation, miralax as needed.  Will con't PT/OT while in SNF.  We ask that KYer CastelloNorcott return to clinic in 2 weeks for cycle #2 avastin.  All questions were answered. The patient knows to call the clinic with any problems, questions or concerns. No barriers to learning were detected.  The total time spent in the encounter was 30 minutes and more than 50% was on counseling and review of test results   ZVentura Sellers MD Medical Director of Neuro-Oncology [facility] [place]06/13/23 12:36 PM

## 2021-11-21 NOTE — Progress Notes (Signed)
[place]  [phone] [phone]) [phone]   Name: Denise Wright Date: 11/21/2021 MRN: [id number]  DOB: [birth year]  Patient Care Team: Willey Blade, MD as PCP - General (Internal Medicine)    REASON FOR CONSULTATION: Denise Wright is a 71 y.o. female with medical history of Glioblastoma s/p right temporal craniotomy, radiation, and Temodar, hypothyroidism, DVTs (Eliquis), and GERD. Patient was initially seen by Palliative during recent hospitalization. Palliative ask to follow for ongoing symptom management and goals of care.    SOCIAL HISTORY:     reports that she has never smoked. She has never used smokeless tobacco. She reports that she does not drink alcohol and does not use drugs.  ADVANCE DIRECTIVES:  Patient reports she does have a documented advanced directive however may need to review and make changes. A new packet and MOST form provided as requested. Education provided on AD clinic and dates.   CODE STATUS: DNR  PAST MEDICAL HISTORY: Past Medical History:  Diagnosis Date   Glioblastoma ([place])    Hyperlipidemia    Hypertension    Pneumonia    Thyroid disease     PAST SURGICAL HISTORY:  Past Surgical History:  Procedure Laterality Date   ABDOMINAL HYSTERECTOMY  2482   APPLICATION OF CRANIAL NAVIGATION Right 07/06/2021   Procedure: APPLICATION OF CRANIAL NAVIGATION;  Surgeon: Dawley, Theodoro Doing, DO;  Location: [facility];  Service: Neurosurgery;  Laterality: Right;   CRANIOTOMY Right 07/06/2021   Procedure: CRANIOTOMY TUMOR EXCISION;  Surgeon: Karsten Ro, DO;  Location: [facility];  Service: Neurosurgery;  Laterality: Right;   TONSILLECTOMY  1965   TOOTH EXTRACTION      HEMATOLOGY/ONCOLOGY HISTORY:  Oncology History  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 Reatha Armour; path is glioblastoma IDH-1wt   08/07/2021 - 09/15/2021  Radiation Therapy   IMRT and concurrent Temodar 11m/m2   10/29/2021 -  Chemotherapy   Patient is on Treatment Plan : BRAIN GLIOBLASTOMA Consolidation Temozolomide Days 1-5 q28 Days      11/21/2021 -  Chemotherapy   Patient is on Treatment Plan : BRAIN GBM Bevacizumab 14d x 6 cycles       ALLERGIES:  is allergic to fexofenadine and penicillins.  MEDICATIONS:  Current Outpatient Medications  Medication Sig Dispense Refill   acetaminophen (TYLENOL) 325 MG tablet Take 2 tablets (650 mg total) by mouth every 6 (six) hours as needed for mild pain (or Fever >/= 101). (Patient not taking: Reported on 11/10/2021)     ALPRAZolam (XANAX) 0.25 MG tablet Take 1 tablet (0.25 mg total) by mouth at bedtime as needed for anxiety or sleep. 30 tablet 0   apixaban (ELIQUIS) 5 MG TABS tablet Take 1 tablet (5 mg total) by mouth 2 (two) times daily. 60 tablet    b complex vitamins capsule Take 1 capsule by mouth daily.     bisacodyl (DULCOLAX) 5 MG EC tablet Take 2 tablets (10 mg total) by mouth daily as needed for moderate constipation. 30 tablet 0   cetirizine (ZYRTEC) 10 MG tablet Take 10 mg by mouth daily.     cholecalciferol (VITAMIN D) 1000 UNITS tablet Take 2,000 Units by mouth daily.     dexamethasone (DECADRON) 1 MG tablet Take 4 tablets (4 mg total) by mouth 2 (two) times daily with a meal. 60 tablet 1   docusate sodium (COLACE) 100 MG capsule Take 1 capsule (100 mg  total) by mouth 2 (two) times daily. 10 capsule 0   famotidine (PEPCID) 20 MG tablet Take 1 tablet (20 mg total) by mouth daily. 30 tablet 2   levothyroxine (SYNTHROID) 50 MCG tablet Take 1 tablet (50 mcg total) by mouth daily. 30 tablet 0   Multiple Vitamins-Minerals (MULTIVITAMIN WITH MINERALS) tablet Take 1 tablet by mouth daily.     ondansetron (ZOFRAN) 8 MG tablet Take 1 tablet by mouth 2 times daily as needed (nausea and vomiting). May take 30-60 minutes prior to Temodar administration if nausea/vomiting occurs. (Patient not taking:  Reported on 11/10/2021) 30 tablet 1   phenazopyridine (PYRIDIUM) 100 MG tablet Take 1 tablet (100 mg total) by mouth 3 (three) times daily with meals. 10 tablet 0   polyethylene glycol (MIRALAX) 17 g packet Take 17 g by mouth daily. 14 each 0   senna (SENOKOT) 8.6 MG TABS tablet Take 1 tablet (8.6 mg total) by mouth daily. 120 tablet 0   No current facility-administered medications for this visit.    VITAL SIGNS: There were no vitals taken for this visit. There were no vitals filed for this visit.  Estimated body mass index is 42.34 kg/m as calculated from the following:   Height as of 11/10/21: 5' 2.5" (1.588 m).   Weight as of 11/17/21: 235 lb 3.7 oz (106.7 kg).  LABS: CBC:    Component Value Date/Time   WBC 8.7 11/21/2021 1122   WBC 7.0 11/11/2021 0522   HGB 12.0 11/21/2021 1122   HCT 35.9 (L) 11/21/2021 1122   PLT 289 11/21/2021 1122   MCV 94.0 11/21/2021 1122   NEUTROABS 8.0 (H) 11/21/2021 1122   LYMPHSABS 0.4 (L) 11/21/2021 1122   MONOABS 0.3 11/21/2021 1122   EOSABS 0.0 11/21/2021 1122   BASOSABS 0.0 11/21/2021 1122   Comprehensive Metabolic Panel:    Component Value Date/Time   NA 141 11/21/2021 1122   K 4.2 11/21/2021 1122   CL 108 11/21/2021 1122   CO2 27 11/21/2021 1122   BUN 28 (H) 11/21/2021 1122   CREATININE 1.00 11/21/2021 1122   GLUCOSE 186 (H) 11/21/2021 1122   CALCIUM 9.0 11/21/2021 1122   AST 11 (L) 11/21/2021 1122   ALT 15 11/21/2021 1122   ALKPHOS 70 11/21/2021 1122   BILITOT 0.4 11/21/2021 1122   PROT 6.3 (L) 11/21/2021 1122   ALBUMIN 3.3 (L) 11/21/2021 1122    RADIOGRAPHIC STUDIES: CT HEAD W & WO CONTRAST (5MM)  Result Date: 11/10/2021 CLINICAL DATA:  Initial evaluation for brain metastases suspected. EXAM: CT HEAD WITHOUT AND WITH CONTRAST TECHNIQUE: Contiguous axial images were obtained from the base of the skull through the vertex without and with intravenous contrast. RADIATION DOSE REDUCTION: This exam was performed according to the  departmental dose-optimization program which includes automated exposure control, adjustment of the mA and/or kV according to patient size and/or use of iterative reconstruction technique. CONTRAST:  46m OMNIPAQUE IOHEXOL 300 MG/ML  SOLN COMPARISON:  Prior MRI from 10/06/2021. FINDINGS: Brain: Postoperative changes from prior right-sided craniotomy for tumor resection again seen. Underlying dural thickening and enhancement, similar to previous. Prominent peripheral enhancement again seen about the resection cavity, similar to previous. Additionally, the overall resection cavity appears increased in size from previous, now measuring up to 4.3 cm (series 4, image 14, previously 3.4 cm when measured in a similar fashion on prior exam. Extensive surrounding vasogenic edema with regional mass effect appears somewhat worsened. Worsened mass effect on the right lateral ventricle which is largely effaced.  Associated right-to-left shift now measures up to 9 mm. No overt ventricular trapping. Basilar cisterns remain patent. No other visible distant lesion or abnormal enhancement visible by CT. No acute intracranial hemorrhage or superimposed large vessel territory infarct. No other extra-axial fluid collection. Vascular: No hyperdense vessel prior to contrast administration. Normal intravascular enhancement seen following contrast administration. Skull: Prior right craniotomy. No acute scalp soft tissue abnormality. Calvarium otherwise intact. Sinuses/Orbits: Right gaze noted. Paranasal sinuses are clear. No mastoid effusion. Other: None. IMPRESSION: 1. Postoperative changes from prior right-sided craniotomy for tumor resection. Prominent peripheral enhancement again seen about the resection cavity, with overall increased size of the cavity as compared to previous. Findings consistent with progressive disease. 2. Worsened surrounding vasogenic edema and regional mass effect. Associated right-to-left midline shift now  measures up to 9 mm. No ventricular trapping. Electronically Signed   By: Jeannine Boga M.D.   On: 11/10/2021 23:15   DG Chest 2 View  Result Date: 11/10/2021 CLINICAL DATA:  Decreased O2 sats. EXAM: CHEST - 2 VIEW COMPARISON:  Chest x-ray 07/13/2011. FINDINGS: The heart is enlarged, unchanged. There is mild elevation of the right hemidiaphragm. There is no definite focal lung infiltrate, pleural effusion or pneumothorax. No acute fractures are seen. IMPRESSION: 1. Cardiomegaly. 2. No evidence for pneumonia or edema. Electronically Signed   By: Ronney Asters M.D.   On: 11/10/2021 19:38   CT Angio Chest PE W and/or Wo Contrast  Result Date: 10/26/2021 CLINICAL DATA:  Shortness of breath, known DVT EXAM: CT ANGIOGRAPHY CHEST WITH CONTRAST TECHNIQUE: Multidetector CT imaging of the chest was performed using the standard protocol during bolus administration of intravenous contrast. Multiplanar CT image reconstructions and MIPs were obtained to evaluate the vascular anatomy. RADIATION DOSE REDUCTION: This exam was performed according to the departmental dose-optimization program which includes automated exposure control, adjustment of the mA and/or kV according to patient size and/or use of iterative reconstruction technique. CONTRAST:  57m OMNIPAQUE IOHEXOL 350 MG/ML SOLN COMPARISON:  CT chest dated 06/30/2021 FINDINGS: Cardiovascular: Satisfactory opacification of the bilateral pulmonary arteries to the lobar level. Lobar pulmonary emboli in the right upper and lower lobes (series 4/images 45 and 68). Segmental/subsegmental pulmonary emboli in the left lower lobe (series 4/images 64 and 81). Overall clot burden is small. No evidence of right heart strain. Mild cardiomegaly. No pericardial effusion. No evidence thoracic aortic aneurysm. Atherosclerotic calcifications of the descending thoracic aorta. Mediastinum/Nodes: No suspicious mediastinal lymphadenopathy. Visualized thyroid is unremarkable.  Lungs/Pleura: Evaluation of the lung parenchyma is constrained by respiratory motion. Within that constraint, there are no suspicious pulmonary nodules. Volume loss in the left hemithorax. No focal consolidation. No pleural effusion or pneumothorax. Upper Abdomen: Visualized upper abdomen is notable for scattered hepatic cysts. Musculoskeletal: Visualized osseous structures are within normal limits. Review of the MIP images confirms the above findings. IMPRESSION: Lobar and segmental pulmonary emboli, as above. Overall clot burden is small. No evidence of right heart strain. These results were called by telephone at the time of interpretation on 10/26/2021 at 12:31 am to provider [facility], who verbally acknowledged these results. Aortic Atherosclerosis (ICD10-I70.0). Electronically Signed   By: SJulian HyM.D.   On: 10/26/2021 00:35   VAS [place]OWER EXTREMITY VENOUS (DVT)  Result Date: 10/25/2021  Lower Venous DVT Study Patient Name:  KESTELLA MALATESTA Date of Exam:   10/25/2021 Medical Rec #: [id number]       Accession #:    [id number] of Birth: [birth year]  Patient Gender: F Patient Age:   72 years Exam Location:  [facility] Procedure:      VAS [place] LOWER EXTREMITY VENOUS (DVT) Referring Phys: Alroy Dust VASLOW --------------------------------------------------------------------------------  Indications: Edema, and Pain.  Risk Factors: Cancer. Limitations: Body habitus, poor ultrasound/tissue interface and patient pain intolerance. Comparison Study: No previous exams Performing Technologist: Jody Hill RVT, RDMS  Examination Guidelines: A complete evaluation includes B-mode imaging, spectral Doppler, color Doppler, and power Doppler as needed of all accessible portions of each vessel. Bilateral testing is considered an integral part of a complete examination. Limited examinations for reoccurring indications may be performed as noted. The reflux portion of the exam is performed with the  patient in reverse Trendelenburg.  +--------+---------------+---------+-----------+----------+--------------------+ RIGHT   CompressibilityPhasicitySpontaneityPropertiesThrombus Aging       +--------+---------------+---------+-----------+----------+--------------------+ CFV     None           Yes      Yes                  Age Indeterminate    +--------+---------------+---------+-----------+----------+--------------------+ SFJ     None                                         Age Indeterminate    +--------+---------------+---------+-----------+----------+--------------------+ FV Prox None           No       No                   Age Indeterminate    +--------+---------------+---------+-----------+----------+--------------------+ FV Mid                                               Not visualized       +--------+---------------+---------+-----------+----------+--------------------+ FV                                                   Not visualized       Distal                                                                    +--------+---------------+---------+-----------+----------+--------------------+ PFV                    Yes      Yes                  patent by                                                                 color/doppler        +--------+---------------+---------+-----------+----------+--------------------+ POP     Partial        Yes  Yes                  Age Indeterminate    +--------+---------------+---------+-----------+----------+--------------------+ PTV                                                  Not visualized       +--------+---------------+---------+-----------+----------+--------------------+ PERO                                                 Not visualized       +--------+---------------+---------+-----------+----------+--------------------+ EIV                    Yes      Yes                   patent by                                                                 color/doppler        +--------+---------------+---------+-----------+----------+--------------------+ External iliac vein not well visualized, it is patent by color and doppler.  Right Technical Findings: Not visualized segments include Mid/distal femoral, peroneal, and posterior tibial veins.  +---------+---------------+---------+-----------+----------+------------------+ LEFT     CompressibilityPhasicitySpontaneityPropertiesThrombus Aging     +---------+---------------+---------+-----------+----------+------------------+ CFV      Full           Yes      Yes                                     +---------+---------------+---------+-----------+----------+------------------+ SFJ      Full                                                            +---------+---------------+---------+-----------+----------+------------------+ FV Prox  Full           Yes      Yes                                     +---------+---------------+---------+-----------+----------+------------------+ FV Mid   Full           Yes      Yes                                     +---------+---------------+---------+-----------+----------+------------------+ FV DistalNone           No       No                   Age Indeterminate  +---------+---------------+---------+-----------+----------+------------------+ PFV      Full                                                            +---------+---------------+---------+-----------+----------+------------------+  POP      None           No       No                   Age Indeterminate  +---------+---------------+---------+-----------+----------+------------------+ PTV                     No       No                   one of paired PTVs +---------+---------------+---------+-----------+----------+------------------+ PERO     None           No       No                    Age Indeterminate  +---------+---------------+---------+-----------+----------+------------------+ Posterior tibials veins visualized by color/doppler only - unable to visualize for compression.    Summary: BILATERAL: -No evidence of popliteal cyst, bilaterally. RIGHT: - Findings consistent with age indeterminate deep vein thrombosis involving the right common femoral vein, SF junction, right femoral vein, and right popliteal vein. - Portions of this examination were limited- see technologist comments above.  LEFT: - Findings consistent with age indeterminate deep vein thrombosis involving the left femoral vein, left popliteal vein, left posterior tibial veins, and left peroneal veins. - Portions of this examination were limited- see technologist comments above.  *See table(s) above for measurements and observations. Electronically signed by Jamelle Haring on 10/25/2021 at 9:16:06 PM.    Final     PERFORMANCE STATUS (ECOG) : 4 - Bedbound  Review of Systems  Constitutional:  Positive for fatigue.  Gastrointestinal:  Positive for constipation.  Neurological:  Positive for weakness.   Unless otherwise noted, a complete review of systems is negative.  Physical Exam General: NAD, in wheelchair Cardiovascular: regular rate and rhythm Pulmonary: normal breathing pattern  Extremities: no edema, no joint deformities Skin: no rashes Neurological: Weakness, AAO x3  IMPRESSION: Ms. Denise Wright was initially seen by Palliative during recent hospitalization (11/12/2021). This is her initial visit with our team here at the [facility]. Her daughter, Denise Wright is with her today. No acute distress noted. She is in a wheelchair.   I introduced myself, [place] RN, and Palliative's role in collaboration with the oncology team. Concept of Palliative Care was introduced as specialized medical care for people and their families living with serious illness.  It focuses on providing relief from the symptoms and  stress of a serious illness.  The goal is to improve quality of life for both the patient and the family. Values and goals of care important to patient and family were attempted to be elicited.   Ms. Calvillo is currently at [facility] for rehabilitation. Prior to her illness she lived in the home and her daughter offered her daily support. She has 3 children, 5 grandchildren, and 3 great-grand. 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   Prior to her illness she was living alone and able to perform all ADLs independently (Jan 2023). Driving etc. She now requires a significant amount of assistance with ADLs and is chairbound.   Denies pain, discomfort, nausea, or vomiting.   Patient is tolerating care at [facility]. Appetite is good. She is working with PT/OT. When she does have pain she feels Tylenol is effective.   Complains of ongoing constipation. Will begin taking Miralax and Senna daily as  recommended per Dr Mickeal Skinner.   Goals of Care  We discussed her current illness and what it means in the larger context of Her on-going co-morbidities. Natural disease trajectory and expectations were discussed.  Ms. Beevers and her daughter are remaining hopeful for ongoing stability and improvement. Their goal is for her to return home in 21 days from [facility] and have regain some strength.   They are taking things day by day.   I empathetically approached topics regarding advanced directives, current code status, and education provided on MOST form. Allie confirms she does have a documented advanced directive. Her daughter is her main health care Media planner. She is planning to review documents as it has been some years since she completed to make sure no updates are needed.   Education provided on MOST form. A copy was also provided for patient and daughter to review and further discuss. They would like to complete at  follow-visit. She confirms wishes for DNR/DNI   I discussed the importance of continued conversation with family and their medical providers regarding overall plan of care and treatment options, ensuring decisions are within the context of the patients values and GOCs.  PLAN: Established therapeutic relationship. Education provided on Palliative's role in collaboration with her Oncology team.  Miralax and Senna daily for bowel regimen No other symptom management needs at this time.  Patient has documented advanced directives. Her daughter, Denise Wright is her HCPOA.  Education provided on MOST form. Daughter given copy. They will discuss with request to complete at follow-up visit.  DNR/DNI as confirmed by patient and daughter.  I will plan to see patient back in 2-4 weeks in collaboration to other oncology appointments.    Patient expressed understanding and was in agreement with this plan. She also understands that She can call the clinic at any time with any questions, concerns, or complaints.   Thank you for your referral and allowing Palliative to assist in Mrs. Hannah Beat Mance's care.   Number and complexity of problems addressed: 2 HIGH - 1 or more chronic illnesses with SEVERE exacerbation, progression, or side effects of treatment - advanced cancer, pain. Any controlled substances utilized were prescribed in the context of palliative care.  Time Total: 50 min   Visit consisted of counseling and education dealing with the complex and emotionally intense issues of symptom management and palliative care in the setting of serious and potentially life-threatening illness.Greater than 50%  of this time was spent counseling and coordinating care related to the above assessment and plan.  Signed by: Alda Lea, AGPCNP-BC Palliative Medicine Team/ [place]

## 2021-11-21 NOTE — Patient Instructions (Signed)
[facility]  Discharge Instructions: Thank you for choosing [facility] to provide your oncology and hematology care.   If you have a lab appointment with the [facility], please go directly to the [facility] and check in at the registration area.   Wear comfortable clothing and clothing appropriate for easy access to any Portacath or PICC line.   We strive to give you quality time with your provider. You may need to reschedule your appointment if you arrive late (15 or more minutes).  Arriving late affects you and other patients whose appointments are after yours.  Also, if you miss three or more appointments without notifying the office, you may be dismissed from the clinic at the provider's discretion.      For prescription refill requests, have your pharmacy contact our office and allow 72 hours for refills to be completed.    Today you received the following chemotherapy and/or immunotherapy agents: Bevacizumab.       To help prevent nausea and vomiting after your treatment, we encourage you to take your nausea medication as directed.  BELOW ARE SYMPTOMS THAT SHOULD BE REPORTED IMMEDIATELY: *FEVER GREATER THAN 100.4 F (38 C) OR HIGHER *CHILLS OR SWEATING *NAUSEA AND VOMITING THAT IS NOT CONTROLLED WITH YOUR NAUSEA MEDICATION *UNUSUAL SHORTNESS OF BREATH *UNUSUAL BRUISING OR BLEEDING *URINARY PROBLEMS (pain or burning when urinating, or frequent urination) *BOWEL PROBLEMS (unusual diarrhea, constipation, pain near the anus) TENDERNESS IN MOUTH AND THROAT WITH OR WITHOUT PRESENCE OF ULCERS (sore throat, sores in mouth, or a toothache) UNUSUAL RASH, SWELLING OR PAIN  UNUSUAL VAGINAL DISCHARGE OR ITCHING   Items with * indicate a potential emergency and should be followed up as soon as possible or go to the Emergency Department if any problems should occur.  Please show the CHEMOTHERAPY ALERT CARD or IMMUNOTHERAPY ALERT CARD at check-in  to the Emergency Department and triage nurse.  Should you have questions after your visit or need to cancel or reschedule your appointment, please contact [facility]  Dept: [phone]  and follow the prompts.  Office hours are 8:00 a.m. to 4:30 p.m. Monday - Friday. Please note that voicemails left after 4:00 p.m. may not be returned until the following business day.  We are closed weekends and major holidays. You have access to a nurse at all times for urgent questions. Please call the main number to the clinic Dept: [phone] and follow the prompts.   For any non-urgent questions, you may also contact your provider using MyChart. We now offer e-Visits for anyone 49 and older to request care online for non-urgent symptoms. For details visit [email or web address].   Also download the MyChart app! Go to the app store, search "MyChart", open the app, select [facility], and log in with your MyChart username and password.  Masks are optional in the cancer centers. If you would like for your care team to wear a mask while they are taking care of you, please let them know. For doctor visits, patients may have with them one support person who is at least 70 years old. At this time, visitors are not allowed in the infusion area. Bevacizumab injection What is this medication? BEVACIZUMAB (be va SIZ yoo mab) is a monoclonal antibody. It is used to treat many types of cancer. This medicine may be used for other purposes; ask your health care provider or pharmacist if you have questions. COMMON BRAND NAME(S): Alymsys, Avastin, MVASI,  Noah Charon What should I tell my care team before I take this medication? They need to know if you have any of these conditions: diabetes heart disease high blood pressure history of coughing up blood prior anthracycline chemotherapy (e.g., doxorubicin, daunorubicin, epirubicin) recent or ongoing radiation therapy recent or planning to have  surgery stroke an unusual or allergic reaction to bevacizumab, hamster proteins, mouse proteins, other medicines, foods, dyes, or preservatives pregnant or trying to get pregnant breast-feeding How should I use this medication? This medicine is for infusion into a vein. It is given by a health care professional in a hospital or clinic setting. Talk to your pediatrician regarding the use of this medicine in children. Special care may be needed. Overdosage: If you think you have taken too much of this medicine contact a poison control center or emergency room at once. NOTE: This medicine is only for you. Do not share this medicine with others. What if I miss a dose? It is important not to miss your dose. Call your doctor or health care professional if you are unable to keep an appointment. What may interact with this medication? Interactions are not expected. This list may not describe all possible interactions. Give your health care provider a list of all the medicines, herbs, non-prescription drugs, or dietary supplements you use. Also tell them if you smoke, drink alcohol, or use illegal drugs. Some items may interact with your medicine. What should I watch for while using this medication? Your condition will be monitored carefully while you are receiving this medicine. You will need important blood work and urine testing done while you are taking this medicine. This medicine may increase your risk to bruise or bleed. Call your doctor or health care professional if you notice any unusual bleeding. Before having surgery, talk to your health care provider to make sure it is ok. This drug can increase the risk of poor healing of your surgical site or wound. You will need to stop this drug for 28 days before surgery. After surgery, wait at least 28 days before restarting this drug. Make sure the surgical site or wound is healed enough before restarting this drug. Talk to your health care provider if  questions. Do not become pregnant while taking this medicine or for 6 months after stopping it. Women should inform their doctor if they wish to become pregnant or think they might be pregnant. There is a potential for serious side effects to an unborn child. Talk to your health care professional or pharmacist for more information. Do not breast-feed an infant while taking this medicine and for 6 months after the last dose. This medicine has caused ovarian failure in some women. This medicine may interfere with the ability to have a child. You should talk to your doctor or health care professional if you are concerned about your fertility. What side effects may I notice from receiving this medication? Side effects that you should report to your doctor or health care professional as soon as possible: allergic reactions like skin rash, itching or hives, swelling of the face, lips, or tongue chest pain or chest tightness chills coughing up blood high fever seizures severe constipation signs and symptoms of bleeding such as bloody or black, tarry stools; red or dark-brown urine; spitting up blood or brown material that looks like coffee grounds; red spots on the skin; unusual bruising or bleeding from the eye, gums, or nose signs and symptoms of a blood clot such as breathing problems; chest  pain; severe, sudden headache; pain, swelling, warmth in the leg signs and symptoms of a stroke like changes in vision; confusion; trouble speaking or understanding; severe headaches; sudden numbness or weakness of the face, arm or leg; trouble walking; dizziness; loss of balance or coordination stomach pain sweating swelling of legs or ankles vomiting weight gain Side effects that usually do not require medical attention (report to your doctor or health care professional if they continue or are bothersome): back pain changes in taste decreased appetite dry skin nausea tiredness This list may not describe  all possible side effects. Call your doctor for medical advice about side effects. You may report side effects to FDA at 1-800-FDA-1088. Where should I keep my medication? This drug is given in a hospital or clinic and will not be stored at home. NOTE: This sheet is a summary. It may not cover all possible information. If you have questions about this medicine, talk to your doctor, pharmacist, or health care provider.  2023 Elsevier/Gold Standard (2021-04-28 00:00:00)

## 2021-11-22 ENCOUNTER — Telehealth: Payer: Self-pay | Admitting: *Deleted

## 2021-11-22 ENCOUNTER — Ambulatory Visit: Payer: BC Managed Care – PPO | Admitting: Physical Medicine & Rehabilitation

## 2021-11-22 NOTE — Telephone Encounter (Signed)
-----   Message from Rafael Bihari, RN sent at 11/21/2021  4:29 PM EDT ----- Regarding: Dr Mickeal Skinner Pt, first time Bevacizumab. Dr Mickeal Skinner Pt came in 6/13 for first time Bevacizumab. Tolerated infusion well. Needs call back.

## 2021-11-22 NOTE — Telephone Encounter (Signed)
Called & spoke with pt's daughter & she reports pt did well with no specific problems.  She knows next appt & how to reach [place] if needed.Marland Kitchen

## 2021-11-24 ENCOUNTER — Non-Acute Institutional Stay: Payer: Self-pay | Admitting: Hospice

## 2021-11-24 ENCOUNTER — Telehealth: Payer: Self-pay

## 2021-11-24 DIAGNOSIS — C719 Malignant neoplasm of brain, unspecified: Secondary | ICD-10-CM

## 2021-11-24 DIAGNOSIS — R531 Weakness: Secondary | ICD-10-CM

## 2021-11-24 DIAGNOSIS — F411 Generalized anxiety disorder: Secondary | ICD-10-CM

## 2021-11-24 DIAGNOSIS — Z515 Encounter for palliative care: Secondary | ICD-10-CM

## 2021-11-24 NOTE — Telephone Encounter (Signed)
CSW made follow-up call to patient's daughter, Denise Wright, to assess needs.  She said the nursing facility where her mother is receiving therapy provides transportation to [facility] for treatment.  [name] expressed no other needs at this time.

## 2021-11-24 NOTE — Progress Notes (Signed)
[facility] Consult Note Telephone: [phone]  Fax: [phone]  PATIENT NAME: Denise Wright [place] [address] [phone] (home)  DOB: 1950-07-17 MRN: [id number]  PRIMARY CARE PROVIDER:    Willey Blade, MD,  [place] [address] [phone]  REFERRING PROVIDER:   Willey Blade, [address] [place],  [place] [address] [phone]  RESPONSIBLE PARTY:   [place]     Name Relation Home Work Mobile   Donn,[name] Daughter [phone]          I met face to face with patient and family at facility. Visit to build trust and highlight Palliative Medicine as specialized medical care for people living with serious illness, aimed at facilitating better quality of life through symptoms relief, assisting with advance care planning and complex medical decision making. Shenita (sister) is with patient during visit.  NP called [name] and updated her on visit.  She expressed appreciation for the call. ASSESSMENT AND / RECOMMENDATIONS:   Advance Care Planning: Our advance care planning conversation included a discussion about:    The value and importance of advance care planning  Difference between Hospice and Palliative care Exploration of goals of care in the event of a sudden injury or illness  Identification and preparation of a healthcare agent  Review and updating or creation of an  advance directive document . Decision not to resuscitate or to de-escalate disease focused treatments due to poor prognosis.  CODE STATUS:Patient is a Do Not Resuscitate  Goals of Care: Goals include to maximize quality of life and symptom management. [name] is interested in hospice service for patient in the future.  [name] explained that patient would rather have hospice service at home if it is possible.  I spent  16 minutes providing this initial  consultation. More than 50% of the time in this consultation was spent on counseling patient and coordinating communication. --------------------------------------------------------------------------------------------------------------------------------------  Symptom Management/Plan: Brain CA: Completed Chemo/radiation. Continues with monoclonal antibody infusion - Bevacizumab every 2 weeks. Tolerating well.  Continue with Oncology as planned/scheduled.  Weakness: currently non ambulatory.  PT OT is ongoing for strengthening, bed mobility/transfers and possibly walk.  Fall precautions. Anxiety: Continue Aprazolam as ordered.  De-escalation techniques.  Routine CBC BMP. Follow up: Palliative care will continue to follow for complex medical decision making, advance care planning, and clarification of goals. Return 6 weeks or prn. Encouraged to call provider sooner with any concerns.   Family /Caregiver/Community Supports: Patient in SNF for rehab.  HOSPICE ELIGIBILITY/DIAGNOSIS: TBD  Chief Complaint: Initial Palliative care visit  HISTORY OF PRESENT ILLNESS:  Denise Wright is a 70 y.o. 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  History obtained from review of EMR, discussion with primary team, caregiver, family and/or Ms. Fitzwater.  Review and summarization of Epic records shows history from other than patient. Rest of 10 point ROS asked and negative.  Independent interpretation of tests and reviewed as needed, available labs, patient records, imaging, studies and related documents from the EMR.  Recent Labs  Lab 11/21/21 1122  WBC 8.7  HGB 12.0  HCT 35.9*  PLT 289  MCV 94.0   Recent Labs  Lab 11/21/21 1122  NA 141  K 4.2  CL 108  CO2 27  BUN 28*   CREATININE 1.00  GLUCOSE 186*   Latest GFR by Cockcroft Gault (not valid in AKI or ESRD) Estimated Creatinine Clearance: 60.7 mL/min (by C-G formula based on SCr of 1 mg/dL). 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   Goals of care, counseling/discussion 07/20/2021   Generalized anxiety disorder 10/18/2021   Cerebral edema ([facility]) 11/10/2021   Acute cystitis 11/11/2021   GERD (gastroesophageal reflux disease) 11/11/2021   History of DVT (deep vein thrombosis) 11/11/2021   Obesity, Class III, BMI 40-49.9 (morbid obesity) ([facility]) 11/14/2021   Physical deconditioning 11/14/2021   Resolved Ambulatory Problems    Diagnosis Date Noted   PNA (pneumonia) 07/13/2011   Hyponatremia 07/13/2011   ARF (acute renal failure) ([facility]) 07/13/2011   Leukocytosis 07/14/2011   Impacted cerumen of both ears 07/17/2011   Mixed hyperlipidemia 08/31/2020   Past Medical History:  Diagnosis Date   Glioblastoma ([facility])    Hyperlipidemia    Hypertension    Pneumonia    Thyroid disease     SOCIAL HX:  Social History   Tobacco Use   Smoking status: Never   Smokeless tobacco: Never  Substance Use Topics   Alcohol use: No     FAMILY HX:  Family History  Problem Relation Age of Onset   Diabetes Mother    Hypertension Mother    Hypertension Father    Leukemia Father    Stroke Father    Cancer Brother    Pneumonia Brother       ALLERGIES:  Allergies  Allergen Reactions   Fexofenadine Hives   Penicillins Itching and Other (See Comments)    Pulling sensation in body      PERTINENT MEDICATIONS:  Outpatient Encounter Medications as of  11/24/2021  Medication Sig   acetaminophen (TYLENOL) 325 MG tablet Take 2 tablets (650 mg total) by mouth every 6 (six) hours as needed for mild pain (or Fever >/= 101). (Patient not taking: Reported on 11/10/2021)   ALPRAZolam (XANAX) 0.25 MG tablet Take 1 tablet (0.25 mg total) by mouth at bedtime as needed for anxiety or sleep.   apixaban (ELIQUIS) 5 MG TABS tablet Take 1 tablet (5 mg total) by mouth 2 (two) times daily.   b complex vitamins capsule Take 1 capsule by mouth daily.   bisacodyl (DULCOLAX) 5 MG EC tablet Take 2 tablets (10 mg total) by mouth daily as needed for moderate constipation.   cetirizine (ZYRTEC) 10 MG tablet Take 10 mg by mouth daily.   cholecalciferol (VITAMIN D) 1000 UNITS tablet Take 2,000 Units by mouth daily.   dexamethasone (DECADRON) 1 MG tablet Take 4 tablets (4 mg total) by mouth 2 (two) times daily with a meal.   docusate sodium (COLACE) 100 MG capsule Take 1 capsule (100 mg total) by mouth 2 (two) times daily.   famotidine (PEPCID) 20 MG tablet Take 1 tablet (20 mg total) by mouth daily.   levothyroxine (SYNTHROID) 50 MCG tablet Take 1 tablet (50 mcg total) by mouth daily.   Multiple Vitamins-Minerals (MULTIVITAMIN WITH MINERALS) tablet Take 1 tablet by mouth daily.   ondansetron (ZOFRAN) 8 MG  tablet Take 1 tablet by mouth 2 times daily as needed (nausea and vomiting). May take 30-60 minutes prior to Temodar administration if nausea/vomiting occurs. (Patient not taking: Reported on 11/10/2021)   phenazopyridine (PYRIDIUM) 100 MG tablet Take 1 tablet (100 mg total) by mouth 3 (three) times daily with meals.   polyethylene glycol (MIRALAX) 17 g packet Take 17 g by mouth daily.   senna (SENOKOT) 8.6 MG TABS tablet Take 1 tablet (8.6 mg total) by mouth daily.   No facility-administered encounter medications on file as of 11/24/2021.     Thank you for the opportunity to participate in the care of Ms. Osoria.  The palliative care team will continue to follow. Please  call our office at [phone] if we can be of additional assistance.   Note: Portions of this note were generated with [employer]. Dictation errors may occur despite best attempts at proofreading.  Teodoro Spray, NP

## 2021-11-30 ENCOUNTER — Ambulatory Visit: Payer: BC Managed Care – PPO | Admitting: Internal Medicine

## 2021-11-30 ENCOUNTER — Other Ambulatory Visit: Payer: BC Managed Care – PPO

## 2021-12-05 ENCOUNTER — Inpatient Hospital Stay: Payer: BC Managed Care – PPO

## 2021-12-05 ENCOUNTER — Inpatient Hospital Stay (HOSPITAL_BASED_OUTPATIENT_CLINIC_OR_DEPARTMENT_OTHER): Payer: BC Managed Care – PPO | Admitting: Internal Medicine

## 2021-12-05 ENCOUNTER — Inpatient Hospital Stay (HOSPITAL_BASED_OUTPATIENT_CLINIC_OR_DEPARTMENT_OTHER): Payer: BC Managed Care – PPO | Admitting: Nurse Practitioner

## 2021-12-05 ENCOUNTER — Encounter: Payer: Self-pay | Admitting: Nurse Practitioner

## 2021-12-05 ENCOUNTER — Other Ambulatory Visit: Payer: Self-pay

## 2021-12-05 VITALS — BP 131/71 | HR 84 | Temp 97.8°F | Resp 18 | Ht 62.5 in

## 2021-12-05 DIAGNOSIS — Z515 Encounter for palliative care: Secondary | ICD-10-CM

## 2021-12-05 DIAGNOSIS — R531 Weakness: Secondary | ICD-10-CM

## 2021-12-05 DIAGNOSIS — Z7189 Other specified counseling: Secondary | ICD-10-CM | POA: Diagnosis not present

## 2021-12-05 DIAGNOSIS — C719 Malignant neoplasm of brain, unspecified: Secondary | ICD-10-CM

## 2021-12-05 DIAGNOSIS — K5900 Constipation, unspecified: Secondary | ICD-10-CM

## 2021-12-05 DIAGNOSIS — Z5111 Encounter for antineoplastic chemotherapy: Secondary | ICD-10-CM | POA: Diagnosis not present

## 2021-12-05 LAB — CBC WITH DIFFERENTIAL (CANCER CENTER ONLY)
Abs Immature Granulocytes: 0.03 10*3/uL (ref 0.00–0.07)
Basophils Absolute: 0 10*3/uL (ref 0.0–0.1)
Basophils Relative: 0 %
Eosinophils Absolute: 0.3 10*3/uL (ref 0.0–0.5)
Eosinophils Relative: 4 %
HCT: 31.9 % — ABNORMAL LOW (ref 36.0–46.0)
Hemoglobin: 10.6 g/dL — ABNORMAL LOW (ref 12.0–15.0)
Immature Granulocytes: 0 %
Lymphocytes Relative: 10 %
Lymphs Abs: 0.7 10*3/uL (ref 0.7–4.0)
MCH: 31.2 pg (ref 26.0–34.0)
MCHC: 33.2 g/dL (ref 30.0–36.0)
MCV: 93.8 fL (ref 80.0–100.0)
Monocytes Absolute: 0.5 10*3/uL (ref 0.1–1.0)
Monocytes Relative: 7 %
Neutro Abs: 5.3 10*3/uL (ref 1.7–7.7)
Neutrophils Relative %: 79 %
Platelet Count: 124 10*3/uL — ABNORMAL LOW (ref 150–400)
RBC: 3.4 MIL/uL — ABNORMAL LOW (ref 3.87–5.11)
RDW: 13.5 % (ref 11.5–15.5)
WBC Count: 6.8 10*3/uL (ref 4.0–10.5)
nRBC: 0 % (ref 0.0–0.2)

## 2021-12-05 LAB — CMP (CANCER CENTER ONLY)
ALT: 10 U/L (ref 0–44)
AST: 10 U/L — ABNORMAL LOW (ref 15–41)
Albumin: 3.2 g/dL — ABNORMAL LOW (ref 3.5–5.0)
Alkaline Phosphatase: 72 U/L (ref 38–126)
Anion gap: 6 (ref 5–15)
BUN: 19 mg/dL (ref 8–23)
CO2: 26 mmol/L (ref 22–32)
Calcium: 8.7 mg/dL — ABNORMAL LOW (ref 8.9–10.3)
Chloride: 108 mmol/L (ref 98–111)
Creatinine: 0.95 mg/dL (ref 0.44–1.00)
GFR, Estimated: >60 mL/min (ref >60)
Glucose, Bld: 93 mg/dL (ref 70–99)
Potassium: 3.5 mmol/L (ref 3.5–5.1)
Sodium: 140 mmol/L (ref 135–145)
Total Bilirubin: 0.4 mg/dL (ref 0.3–1.2)
Total Protein: 6.1 g/dL — ABNORMAL LOW (ref 6.5–8.1)

## 2021-12-05 MED ORDER — SODIUM CHLORIDE 0.9 % IV SOLN
10.0000 mg/kg | Freq: Once | INTRAVENOUS | Status: AC
  Administered 2021-12-05: 1000 mg via INTRAVENOUS
  Filled 2021-12-05: qty 32

## 2021-12-05 MED ORDER — SODIUM CHLORIDE 0.9 % IV SOLN: Freq: Once | INTRAVENOUS | Status: AC

## 2021-12-05 NOTE — Progress Notes (Signed)
[facility] at [facility] [address]  [place], [place] [address] [phone]   Interval Evaluation  Date of Service: 12/05/21 Patient Name: Denise Wright Patient MRN: [id number] Patient DOB: [birth year] Provider: Henreitta Leber, MD  Identifying Statement:  Denise Wright is a 71 y.o. 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 Jake Samples; path is glioblastoma IDH-1wt   08/07/2021 - 09/15/2021 Radiation Therapy   IMRT and concurrent Temodar 75mg /m2   10/29/2021 -  Chemotherapy   Patient is on Treatment Plan : BRAIN GLIOBLASTOMA Consolidation Temozolomide Days 1-5 q28 Days      11/21/2021 -  Chemotherapy   Patient is on Treatment Plan : BRAIN GBM Bevacizumab 14d x 6 cycles       Biomarkers:  MGMT Unknown.  IDH 1/2 Wild type.  EGFR Unknown  TERT Unknown   Interval History: Denise Wright presents today for second infusion of avastin.  She describes no new or progressive deficits today.  She walked a few steps with PT using the walker this week.  Swelling in legs has improved modestly.  Remains incontinent of urine in pads.  No issues with Eliquis.  Fatigue continues to be very persistent and problematic.  Denies seizures or headaches today.  Decadron down to 2mg  daily.  H+P (07/20/21) Patient presented to medical attention last month with several days, weeks of progressive mental status changes.  Family noticed she was confused and disoriented over normal conversation and activities of daily living.  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  CNS imaging demonstrated an enhancing mass in the right temporal lobe; she underwent craniotomy, resection with Dr. Jake Samples on 07/06/21, which demonstrated glioblastoma.  Following surgery she  experienced worsening left sided weakness, which prompted rehabilitation admission.  At present, she is doing some walking on her own, albeit slowly.  Left arm and hand have improved, she is starting occupational therapy this week.  Medications: Current Outpatient Medications on File Prior to Visit  Medication Sig Dispense Refill   acetaminophen (TYLENOL) 325 MG tablet Take 2 tablets (650 mg total) by mouth every 6 (six) hours as needed for mild pain (or Fever >/= 101). (Patient not taking: Reported on 11/10/2021)     ALPRAZolam (XANAX) 0.25 MG tablet Take 1 tablet (0.25 mg total) by mouth at bedtime as needed for anxiety or sleep. 30 tablet 0   apixaban (ELIQUIS) 5 MG TABS tablet Take 1 tablet (5 mg total) by mouth 2 (two) times daily. 60 tablet    b complex vitamins capsule Take 1 capsule by mouth daily.     bisacodyl (DULCOLAX) 5 MG EC tablet Take 2 tablets (10 mg total) by mouth daily as needed for moderate constipation. 30 tablet 0   cetirizine (ZYRTEC) 10 MG tablet Take 10 mg by mouth daily.     cholecalciferol (VITAMIN D) 1000 UNITS tablet Take 2,000 Units by mouth daily.     dexamethasone (DECADRON) 1 MG tablet Take 4 tablets (4 mg total) by mouth 2 (two) times daily with a meal. 60 tablet 1   docusate sodium (COLACE) 100 MG capsule Take 1 capsule (100 mg total) by mouth 2 (two) times daily. 10 capsule 0   famotidine (PEPCID) 20 MG tablet Take 1 tablet (20 mg total) by mouth daily. 30 tablet 2  levothyroxine (SYNTHROID) 50 MCG tablet Take 1 tablet (50 mcg total) by mouth daily. 30 tablet 0   Multiple Vitamins-Minerals (MULTIVITAMIN WITH MINERALS) tablet Take 1 tablet by mouth daily.     ondansetron (ZOFRAN) 8 MG tablet Take 1 tablet by mouth 2 times daily as needed (nausea and vomiting). May take 30-60 minutes prior to Temodar administration if nausea/vomiting occurs. (Patient not taking: Reported on 11/10/2021) 30 tablet 1   phenazopyridine (PYRIDIUM) 100 MG tablet Take 1 tablet (100 mg  total) by mouth 3 (three) times daily with meals. 10 tablet 0   polyethylene glycol (MIRALAX) 17 g packet Take 17 g by mouth daily. 14 each 0   senna (SENOKOT) 8.6 MG TABS tablet Take 1 tablet (8.6 mg total) by mouth daily. 120 tablet 0   No current facility-administered medications on file prior to visit.    Allergies:  Allergies  Allergen Reactions   Fexofenadine Hives   Penicillins Itching and Other (See Comments)    Pulling sensation in body   Past Medical History:  Past Medical History:  Diagnosis Date   Glioblastoma (HCC)    Hyperlipidemia    Hypertension    Pneumonia    Thyroid disease    Past Surgical History:  Past Surgical History:  Procedure Laterality Date   ABDOMINAL HYSTERECTOMY  1983   APPLICATION OF CRANIAL NAVIGATION Right 07/06/2021   Procedure: APPLICATION OF CRANIAL NAVIGATION;  Surgeon: Bethann Goo, DO;  Location: [facility] OR;  Service: Neurosurgery;  Laterality: Right;   CRANIOTOMY Right 07/06/2021   Procedure: CRANIOTOMY TUMOR EXCISION;  Surgeon: Bethann Goo, DO;  Location: [facility] OR;  Service: Neurosurgery;  Laterality: Right;   TONSILLECTOMY  1965   TOOTH EXTRACTION     Social History:  Social History   Socioeconomic History   Marital status: Divorced    Spouse name: Not on file   Number of children: 3   Years of education: 20   Highest education level: Not on file  Occupational History   Occupation: Masters degree in Chiropodist: [employer]   Occupation: Working on Merchandiser, retail   Occupation: Psychiatric nurse of sickle cell  Tobacco Use   Smoking status: Never   Smokeless tobacco: Never  Vaping Use   Vaping Use: Never used  Substance and Sexual Activity   Alcohol use: No   Drug use: No   Sexual activity: Yes    Birth control/protection: Surgical  Other Topics Concern   Not on file  Social History Narrative   Divorced, lives in [place] alone.  Independent of ADLs.   Social  Determinants of Health   Financial Resource Strain: Not on file  Food Insecurity: No Food Insecurity (11/15/2021)   Hunger Vital Sign    Worried About Running Out of Food in the Last Year: Never true    Ran Out of Food in the Last Year: Never true  Transportation Needs: No Transportation Needs (11/15/2021)   PRAPARE - Administrator, Civil Service (Medical): No    Lack of Transportation (Non-Medical): No  Physical Activity: Not on file  Stress: Not on file  Social Connections: Not on file  Intimate Partner Violence: Not on file   Family History:  Family History  Problem Relation Age of Onset   Diabetes Mother    Hypertension Mother    Hypertension Father    Leukemia Father    Stroke Father    Cancer Brother  Pneumonia Brother     Review of Systems: Constitutional: Doesn't report fevers, chills or abnormal weight loss Eyes: Doesn't report blurriness of vision Ears, nose, mouth, throat, and face: Doesn't report sore throat Respiratory: Doesn't report cough, dyspnea or wheezes Cardiovascular: Doesn't report palpitation, chest discomfort  Gastrointestinal: +constipation GU: Doesn't report incontinence Skin: Doesn't report skin rashes Neurological: Per HPI Musculoskeletal: Doesn't report joint pain Behavioral/Psych: Doesn't report anxiety  Physical Exam: There were no vitals filed for this visit.   KPS: 60 ECOG 1 General: wheelchair bound Head: Normal EENT: No conjunctival injection or scleral icterus.  Lungs: Resp effort normal Cardiac: Regular rate Abdomen: Non-distended abdomen Skin: normal Extremities: 2+ dependent edema R>L  Neurologic Exam: Mental Status: Awake, alert, attentive to examiner. Oriented to self and environment. Language is fluent with intact comprehension.  Cranial Nerves: Visual acuity is grossly normal. Visual fields are full. Extra-ocular movements intact. No ptosis. Face is symmetric Motor: Tone and bulk are normal. Power is 4/5  in left arm and leg, asterixis noted with posturing. Reflexes are symmetric, no pathologic reflexes present.  Sensory: Intact to light touch Gait: Hemiparetic  Labs: I have reviewed the data as listed    Component Value Date/Time   NA 141 11/21/2021 1122   K 4.2 11/21/2021 1122   CL 108 11/21/2021 1122   CO2 27 11/21/2021 1122   GLUCOSE 186 (H) 11/21/2021 1122   BUN 28 (H) 11/21/2021 1122   CREATININE 1.00 11/21/2021 1122   CALCIUM 9.0 11/21/2021 1122   PROT 6.3 (L) 11/21/2021 1122   ALBUMIN 3.3 (L) 11/21/2021 1122   AST 11 (L) 11/21/2021 1122   ALT 15 11/21/2021 1122   ALKPHOS 70 11/21/2021 1122   BILITOT 0.4 11/21/2021 1122   GFRNONAA >60 11/21/2021 1122   GFRAA 70 (L) 07/17/2011 0311   Lab Results  Component Value Date   WBC 8.7 11/21/2021   NEUTROABS 8.0 (H) 11/21/2021   HGB 12.0 11/21/2021   HCT 35.9 (L) 11/21/2021   MCV 94.0 11/21/2021   PLT 289 11/21/2021    Assessment/Plan 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  She is cleared for cycle #2/4 of avastin today.  Will defer urine protein.  Avastin should be held for the following:  ANC less than 500  Platelets less than 50,000  LFT or creatinine greater than 2x ULN  If clinical concerns/contraindications develop  Ok to discontinue decadron if tolerated.  Will con't PT/OT while in SNF.  We ask that Denise Wright return to clinic in 2 weeks for cycle #3/4 avastin.  All questions were answered. The patient knows to call the clinic with any problems, questions or concerns. No barriers to learning were detected.  The total time spent in the encounter was 30 minutes and more than 50% was on counseling and review of test results   Henreitta Leber, MD Medical Director of Neuro-Oncology [facility] at [place] [facility] 12/05/21 12:18 PM

## 2021-12-11 ENCOUNTER — Encounter: Payer: Self-pay | Admitting: Emergency Medicine

## 2021-12-11 DIAGNOSIS — C719 Malignant neoplasm of brain, unspecified: Secondary | ICD-10-CM

## 2021-12-11 NOTE — Research (Signed)
TRIAL: [id number] A Single Arm, Pilot Study of Ramipril for Preventing Radiation-Induced Cognitive Decline in Glioblastoma (GBM) Patients Receiving Brain Radiotherapy   3 MONTH POST RT VISIT   Contacted patient and her daughter via phone today for 3 month post RT phone call.  Verified correct patient identity using two identifiers.  Daughter [name] is in the room with patient.  ROI on file for all [facility] practices for daughter.   MEDICATION REVIEW: Patient and daughter verified the current medication list is correct.  Addition of Eliquis, zyrtec, and lasix verified.  Reports taking 1 mg decadron twice daily.  Restarted temodar 11/05/21.  Reported changes were recorded on the medication list and marked as reviewed. All other listed medications remain the same.    ADVERSE EVENTS:  All graded using CTCAE v. 5.0 Patient stopped taking Ramipril study drug on 09/11/2021 (greater than 30 days ago).  There have been no reportable AE's since last visit with Research on 10/09/2021. 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 Mickeal Skinner but pharmacy was unable to fill the prescription.  The family was concerned about the patient developing PEs given the known DVTs at the time, so they took her to the ED.  No action or reporting was required, unrelated to study drug per MD Vaslow.   STUDY STATUS:              Patient will continue off agent but on study.  Patient is undecided on moving forward with additional treatment with avastin or palliative care.  Patient/sister aware to expect 3 month f/u call in about one month.  Patient is not on Optune device treatment at this time.   The patient was thanked for their time and continued voluntary participation in this study. Patient Denise Wright has been provided direct contact information and is encouraged to contact this Nurse for any needs or questions.

## 2021-12-14 ENCOUNTER — Encounter: Payer: Self-pay | Admitting: Internal Medicine

## 2021-12-19 ENCOUNTER — Telehealth: Payer: Self-pay | Admitting: *Deleted

## 2021-12-19 NOTE — Telephone Encounter (Signed)
Received call from [employer] wanting information about patient for short term disability claim.    [employer] [phone]  Claim # [id number]  Provided information.  No further information needed at this time.

## 2021-12-21 ENCOUNTER — Inpatient Hospital Stay (HOSPITAL_BASED_OUTPATIENT_CLINIC_OR_DEPARTMENT_OTHER): Payer: BC Managed Care – PPO | Admitting: Nurse Practitioner

## 2021-12-21 ENCOUNTER — Inpatient Hospital Stay: Payer: BC Managed Care – PPO | Admitting: Nurse Practitioner

## 2021-12-21 ENCOUNTER — Encounter: Payer: Self-pay | Admitting: Emergency Medicine

## 2021-12-21 ENCOUNTER — Telehealth: Payer: Self-pay | Admitting: *Deleted

## 2021-12-21 ENCOUNTER — Inpatient Hospital Stay: Payer: BC Managed Care – PPO

## 2021-12-21 ENCOUNTER — Inpatient Hospital Stay (HOSPITAL_BASED_OUTPATIENT_CLINIC_OR_DEPARTMENT_OTHER): Payer: BC Managed Care – PPO | Admitting: Internal Medicine

## 2021-12-21 ENCOUNTER — Other Ambulatory Visit: Payer: Self-pay

## 2021-12-21 ENCOUNTER — Inpatient Hospital Stay: Payer: BC Managed Care – PPO | Attending: Internal Medicine

## 2021-12-21 VITALS — BP 131/89 | HR 108 | Temp 98.1°F | Resp 18 | Ht 62.5 in

## 2021-12-21 DIAGNOSIS — I10 Essential (primary) hypertension: Secondary | ICD-10-CM | POA: Diagnosis not present

## 2021-12-21 DIAGNOSIS — Z7901 Long term (current) use of anticoagulants: Secondary | ICD-10-CM | POA: Insufficient documentation

## 2021-12-21 DIAGNOSIS — R531 Weakness: Secondary | ICD-10-CM | POA: Diagnosis not present

## 2021-12-21 DIAGNOSIS — C719 Malignant neoplasm of brain, unspecified: Secondary | ICD-10-CM

## 2021-12-21 DIAGNOSIS — G893 Neoplasm related pain (acute) (chronic): Secondary | ICD-10-CM

## 2021-12-21 DIAGNOSIS — C712 Malignant neoplasm of temporal lobe: Secondary | ICD-10-CM | POA: Diagnosis present

## 2021-12-21 DIAGNOSIS — Z515 Encounter for palliative care: Secondary | ICD-10-CM

## 2021-12-21 DIAGNOSIS — Z7189 Other specified counseling: Secondary | ICD-10-CM

## 2021-12-21 DIAGNOSIS — Z806 Family history of leukemia: Secondary | ICD-10-CM | POA: Diagnosis not present

## 2021-12-21 DIAGNOSIS — Z90710 Acquired absence of both cervix and uterus: Secondary | ICD-10-CM | POA: Diagnosis not present

## 2021-12-21 LAB — CMP (CANCER CENTER ONLY)
ALT: 7 U/L (ref 0–44)
AST: 13 U/L — ABNORMAL LOW (ref 15–41)
Albumin: 3.3 g/dL — ABNORMAL LOW (ref 3.5–5.0)
Alkaline Phosphatase: 67 U/L (ref 38–126)
Anion gap: 9 (ref 5–15)
BUN: 9 mg/dL (ref 8–23)
CO2: 25 mmol/L (ref 22–32)
Calcium: 8.8 mg/dL — ABNORMAL LOW (ref 8.9–10.3)
Chloride: 105 mmol/L (ref 98–111)
Creatinine: 0.88 mg/dL (ref 0.44–1.00)
GFR, Estimated: >60 mL/min (ref >60)
Glucose, Bld: 106 mg/dL — ABNORMAL HIGH (ref 70–99)
Potassium: 3.6 mmol/L (ref 3.5–5.1)
Sodium: 139 mmol/L (ref 135–145)
Total Bilirubin: 0.6 mg/dL (ref 0.3–1.2)
Total Protein: 6.7 g/dL (ref 6.5–8.1)

## 2021-12-21 LAB — CBC WITH DIFFERENTIAL (CANCER CENTER ONLY)
Abs Immature Granulocytes: 0.02 K/uL (ref 0.00–0.07)
Basophils Absolute: 0 K/uL (ref 0.0–0.1)
Basophils Relative: 1 %
Eosinophils Absolute: 0.1 K/uL (ref 0.0–0.5)
Eosinophils Relative: 2 %
HCT: 31.7 % — ABNORMAL LOW (ref 36.0–46.0)
Hemoglobin: 10.6 g/dL — ABNORMAL LOW (ref 12.0–15.0)
Immature Granulocytes: 0 %
Lymphocytes Relative: 10 %
Lymphs Abs: 0.6 K/uL — ABNORMAL LOW (ref 0.7–4.0)
MCH: 31 pg (ref 26.0–34.0)
MCHC: 33.4 g/dL (ref 30.0–36.0)
MCV: 92.7 fL (ref 80.0–100.0)
Monocytes Absolute: 0.6 K/uL (ref 0.1–1.0)
Monocytes Relative: 9 %
Neutro Abs: 4.6 K/uL (ref 1.7–7.7)
Neutrophils Relative %: 78 %
Platelet Count: 280 K/uL (ref 150–400)
RBC: 3.42 MIL/uL — ABNORMAL LOW (ref 3.87–5.11)
RDW: 13.2 % (ref 11.5–15.5)
WBC Count: 6 K/uL (ref 4.0–10.5)
nRBC: 0 % (ref 0.0–0.2)

## 2021-12-21 MED ORDER — LORAZEPAM 0.5 MG PO TABS: 0.5000 mg | ORAL_TABLET | Freq: Four times a day (QID) | ORAL | 0 refills | Status: AC | PRN

## 2021-12-21 MED ORDER — MORPHINE SULFATE (CONCENTRATE) 20 MG/ML PO SOLN: 5.0000 mg | ORAL | 0 refills | Status: AC | PRN

## 2021-12-21 MED ORDER — ONDANSETRON 8 MG PO TBDP
8.0000 mg | ORAL_TABLET | Freq: Four times a day (QID) | ORAL | 0 refills | Status: DC | PRN
Start: 2021-12-21 — End: 2021-12-21

## 2021-12-21 MED ORDER — ONDANSETRON 8 MG PO TBDP
8.0000 mg | ORAL_TABLET | Freq: Four times a day (QID) | ORAL | 1 refills | Status: AC | PRN
Start: 2021-12-21 — End: ?

## 2021-12-21 NOTE — Patient Instructions (Addendum)
-   take 5-73m of Roxinaol every 4 hours as needed for pain - take miralax and senna daily for constipation  - take sublingual zofran every 6 hours as  needed for nausea  - take 0.5-'1mg'$  of sublingual ativan every 4 hours as needed for anxiety or seizure like activity.  - [facility] to follow

## 2021-12-21 NOTE — Progress Notes (Signed)
[facility] [place] at [place], [place] [address] [phone]   Interval Evaluation  Date of Service: 12/21/21 Patient Name: Denise Wright Patient MRN: [id number] Patient DOB: 1951/03/09 Provider: Ventura Sellers, MD  Identifying Statement:  Denise Wright is a 71 y.o. female with right temporal glioblastoma   Oncologic History: Oncology History  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[place])  07/06/2021 Surgery   Right temporal craniotomy, resection with Dr. Reatha Armour; path is glioblastoma IDH-1wt   08/07/2021 - 09/15/2021 Radiation Therapy   IMRT and concurrent Temodar 37m/m2   10/29/2021 -  Chemotherapy   Patient is on Treatment Plan : BRAIN GLIOBLASTOMA Consolidation Temozolomide Days 1-5 q28 Days      11/21/2021 -  Chemotherapy   Patient is on Treatment Plan : BRAIN GBM Bevacizumab 14d x 6 cycles       Biomarkers:  MGMT Unknown.  IDH 1/2 Wild type.  EGFR Unknown  TERT Unknown   Interval History: Denise Wright presents today for next scheduled treatment of avastin.  Daughter describes ongoing decline in awake time.  Now sleeping almost all day, with breaks to eat.  She is pocketing some food in her mouth.  Swelling in legs has improved modestly.  Remains incontinent of urine in pads.  No issues with Eliquis.  Denies seizures or headaches today.  Now off decadron.  H+P (07/20/21) Patient presented to medical attention last month with several days, weeks of progressive mental status changes.  Family noticed she was confused and disoriented over normal conversation and activities of daily living.  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  CNS imaging demonstrated an enhancing mass in the right temporal lobe; she underwent craniotomy, resection with Dr. DReatha Armouron 07/06/21, which demonstrated glioblastoma.  Following surgery she experienced worsening left  sided weakness, which prompted rehabilitation admission.  At present, she is doing some walking on her own, albeit slowly.  Left arm and hand have improved, she is starting occupational therapy this week.  Medications: Current Outpatient Medications on File Prior to Visit  Medication Sig Dispense Refill   acetaminophen (TYLENOL) 325 MG tablet Take 2 tablets (650 mg total) by mouth every 6 (six) hours as needed for mild pain (or Fever >/= 101).     amLODipine (NORVASC) 5 MG tablet Take 5 mg by mouth daily.     apixaban (ELIQUIS) 5 MG TABS tablet Take 1 tablet (5 mg total) by mouth 2 (two) times daily. 60 tablet    b complex vitamins capsule Take 1 capsule by mouth daily.     bisacodyl (DULCOLAX) 5 MG EC tablet Take 2 tablets (10 mg total) by mouth daily as needed for moderate constipation. 30 tablet 0   cetirizine (ZYRTEC) 10 MG tablet Take 10 mg by mouth daily.     cholecalciferol (VITAMIN D) 1000 UNITS tablet Take 2,000 Units by mouth daily.     docusate sodium (COLACE) 100 MG capsule Take 1 capsule (100 mg total) by mouth 2 (two) times daily. 10 capsule 0   famotidine (PEPCID) 20 MG tablet Take 1 tablet (20 mg total) by mouth daily. 30 tablet 2   levothyroxine (SYNTHROID) 50 MCG tablet Take 1 tablet (50 mcg total) by mouth daily. 30 tablet 0   Multiple Vitamins-Minerals (MULTIVITAMIN WITH MINERALS) tablet Take 1 tablet by mouth daily.     phenazopyridine (PYRIDIUM) 100 MG tablet Take 1 tablet (100 mg total) by  mouth 3 (three) times daily with meals. 10 tablet 0   polyethylene glycol (MIRALAX) 17 g packet Take 17 g by mouth daily. 14 each 0   senna (SENOKOT) 8.6 MG TABS tablet Take 1 tablet (8.6 mg total) by mouth daily. 120 tablet 0   ALPRAZolam (XANAX) 0.25 MG tablet Take 1 tablet (0.25 mg total) by mouth at bedtime as needed for anxiety or sleep. (Patient not taking: Reported on 12/21/2021) 30 tablet 0   ondansetron (ZOFRAN) 8 MG tablet Take 1 tablet by mouth 2 times daily as needed (nausea  and vomiting). May take 30-60 minutes prior to Temodar administration if nausea/vomiting occurs. (Patient not taking: Reported on 11/10/2021) 30 tablet 1   No current facility-administered medications on file prior to visit.    Allergies:  Allergies  Allergen Reactions   Fexofenadine Hives   Penicillins Itching and Other (See Comments)    Pulling sensation in body   Past Medical History:  Past Medical History:  Diagnosis Date   Glioblastoma ([place])    Hyperlipidemia    Hypertension    Pneumonia    Thyroid disease    Past Surgical History:  Past Surgical History:  Procedure Laterality Date   ABDOMINAL HYSTERECTOMY  5329   APPLICATION OF CRANIAL NAVIGATION Right 07/06/2021   Procedure: APPLICATION OF CRANIAL NAVIGATION;  Surgeon: Karsten Ro, DO;  Location: [facility];  Service: Neurosurgery;  Laterality: Right;   CRANIOTOMY Right 07/06/2021   Procedure: CRANIOTOMY TUMOR EXCISION;  Surgeon: Karsten Ro, DO;  Location: [facility];  Service: Neurosurgery;  Laterality: Right;   TONSILLECTOMY  1965   TOOTH EXTRACTION     Social History:  Social History   Socioeconomic History   Marital status: Divorced    Spouse name: Not on file   Number of children: 3   Years of education: 20   Highest education level: Not on file  Occupational History   Occupation: Masters degree in Psychologist, counselling: [employer]   Occupation: Working on Engineer, maintenance   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  Independent of ADLs.   Social Determinants of Health   Financial Resource Strain: Not on file  Food Insecurity: No  Food Insecurity (11/15/2021)   Hunger Vital Sign    Worried About Running Out of Food in the Last Year: Never true    Ran Out of Food in the Last Year: Never true  Transportation Needs: No Transportation Needs (11/15/2021)   PRAPARE - Hydrologist (Medical): No    Lack of Transportation (Non-Medical): No  Physical Activity: Not on file  Stress: Not on file  Social Connections: Not on file  Intimate Partner Violence: Not on file   Family History:  Family History  Problem Relation Age of Onset   Diabetes Mother    Hypertension Mother    Hypertension Father    Leukemia Father    Stroke Father    Cancer Brother    Pneumonia Brother     Review of Systems: Constitutional: Doesn't report fevers, chills or  abnormal weight loss Eyes: Doesn't report blurriness of vision Ears, nose, mouth, throat, and face: Doesn't report sore throat Respiratory: Doesn't report cough, dyspnea or wheezes Cardiovascular: Doesn't report palpitation, chest discomfort  Gastrointestinal: +constipation GU: Doesn't report incontinence Skin: Doesn't report skin rashes Neurological: Per HPI Musculoskeletal: Doesn't report joint pain Behavioral/Psych: Doesn't report anxiety  Physical Exam: Vitals:   12/21/21 0925  BP: 131/89  Pulse: (!) 108  Resp: 18  Temp: 98.1 F (36.7 C)  SpO2: 95%     KPS: 50 ECOG 1 General: wheelchair bound Head: Normal EENT: No conjunctival injection or scleral icterus.  Lungs: Resp effort normal Cardiac: Regular rate Abdomen: Non-distended abdomen Skin: normal Extremities: 1+ dependent edema R>L  Neurologic Exam: Mental Status: Drowsy, not alert. Cranial Nerves: Visual acuity is grossly normal. Visual fields are full. Extra-ocular movements intact. No ptosis. Face is symmetric Motor: Tone and bulk are normal. Power is 4/5 in left arm and leg, asterixis noted with posturing. Reflexes are symmetric, no pathologic reflexes present.  Sensory:  Intact to light touch Gait: Wheelchair bound  Labs: I have reviewed the data as listed    Component Value Date/Time   NA 139 12/21/2021 0837   K 3.6 12/21/2021 0837   CL 105 12/21/2021 0837   CO2 25 12/21/2021 0837   GLUCOSE 106 (H) 12/21/2021 0837   BUN 9 12/21/2021 0837   CREATININE 0.88 12/21/2021 0837   CALCIUM 8.8 (L) 12/21/2021 0837   PROT 6.7 12/21/2021 0837   ALBUMIN 3.3 (L) 12/21/2021 0837   AST 13 (L) 12/21/2021 0837   ALT 7 12/21/2021 0837   ALKPHOS 67 12/21/2021 0837   BILITOT 0.6 12/21/2021 0837   GFRNONAA >60 12/21/2021 0837   GFRAA 70 (L) 07/17/2011 0311   Lab Results  Component Value Date   WBC 6.0 12/21/2021   NEUTROABS 4.6 12/21/2021   HGB 10.6 (L) 12/21/2021   HCT 31.7 (L) 12/21/2021   MCV 92.7 12/21/2021   PLT 280 12/21/2021    Assessment/Plan 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  She is not cleared for avastin infusion today because of lack of urine protein specimen, sacral skin breakdown, and overall poor efficacy of treatment.  We extensively reviewed goals of care and recommended transition to hospice today.  Daughter is not quite ready for this transition, and is requesting an additional MRI scan.  She understands there are no anti-tumor therapies left at this time.  They will visit further with palliative care later this morning.  We will arrange an MRI study and give her a call with the results, continue goals of care of discussion over the phone.  All questions were answered. The patient knows to call the clinic with any problems, questions or concerns. No barriers to learning were detected.  The total time spent in the encounter was 30 minutes and more than 50% was on counseling and review of test results   Ventura Sellers, MD Medical Director of Neuro-Oncology [facility] at [place] 12/21/21 9:27 AM

## 2021-12-21 NOTE — Progress Notes (Signed)
[place]  [phone] [phone]   Name: Denise Wright Date: 12/21/2021 MRN: [id number]  DOB: 16-Aug-1950  Patient Care Team: Willey Blade, MD as PCP - General (Internal Medicine)    REASON FOR CONSULTATION: Denise Wright is a 71 y.o. female with medical history of Glioblastoma s/p right temporal craniotomy, radiation, and Temodar, hypothyroidism, DVTs (Eliquis), and GERD. Patient was initially seen by Palliative during recent hospitalization. Palliative ask to follow for ongoing symptom management and goals of care.    SOCIAL HISTORY:     reports that she has never smoked. She has never used smokeless tobacco. She reports that she does not drink alcohol and does not use drugs.  ADVANCE DIRECTIVES:  Patient reports she does have a documented advanced directive however may need to review and make changes. A new packet and MOST form provided as requested. Education provided on AD clinic and dates.   CODE STATUS: DNR  PAST MEDICAL HISTORY: Past Medical History:  Diagnosis Date   Glioblastoma ([place])    Hyperlipidemia    Hypertension    Pneumonia    Thyroid disease     PAST SURGICAL HISTORY:  Past Surgical History:  Procedure Laterality Date   ABDOMINAL HYSTERECTOMY  6295   APPLICATION OF CRANIAL NAVIGATION Right 07/06/2021   Procedure: APPLICATION OF CRANIAL NAVIGATION;  Surgeon: Dawley, Theodoro Doing, DO;  Location: [facility];  Service: Neurosurgery;  Laterality: Right;   CRANIOTOMY Right 07/06/2021   Procedure: CRANIOTOMY TUMOR EXCISION;  Surgeon: Karsten Ro, DO;  Location: [facility];  Service: Neurosurgery;  Laterality: Right;   TONSILLECTOMY  1965   TOOTH EXTRACTION      HEMATOLOGY/ONCOLOGY HISTORY:  Oncology History  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 Reatha Armour; path is glioblastoma [id number]   08/07/2021 - 09/15/2021  Radiation Therapy   IMRT and concurrent Temodar 46m/m2   10/29/2021 -  Chemotherapy   Patient is on Treatment Plan : BRAIN GLIOBLASTOMA Consolidation Temozolomide Days 1-5 q28 Days      11/21/2021 -  Chemotherapy   Patient is on Treatment Plan : BRAIN GBM Bevacizumab 14d x 6 cycles       ALLERGIES:  is allergic to fexofenadine and penicillins.  MEDICATIONS:  Current Outpatient Medications  Medication Sig Dispense Refill   LORazepam (ATIVAN) 0.5 MG tablet Take 1-2 tablets (0.5-1 mg total) by mouth every 6 (six) hours as needed for anxiety or seizure. 60 tablet 0   morphine (ROXANOL) 20 MG/ML concentrated solution Take 0.25-0.5 mLs (5-10 mg total) by mouth every 2 (two) hours as needed for severe pain, moderate pain or shortness of breath. 240 mL 0   acetaminophen (TYLENOL) 325 MG tablet Take 2 tablets (650 mg total) by mouth every 6 (six) hours as needed for mild pain (or Fever >/= 101).     ALPRAZolam (XANAX) 0.25 MG tablet Take 1 tablet (0.25 mg total) by mouth at bedtime as needed for anxiety or sleep. (Patient not taking: Reported on 12/21/2021) 30 tablet 0   amLODipine (NORVASC) 5 MG tablet Take 5 mg by mouth daily.     apixaban (ELIQUIS) 5 MG TABS tablet Take 1 tablet (5 mg total) by mouth 2 (two) times daily. 60 tablet    b complex vitamins capsule Take 1 capsule by mouth daily.     bisacodyl (DULCOLAX) 5 MG EC tablet Take 2 tablets (10 mg total) by mouth daily  as needed for moderate constipation. 30 tablet 0   cetirizine (ZYRTEC) 10 MG tablet Take 10 mg by mouth daily.     cholecalciferol (VITAMIN D) 1000 UNITS tablet Take 2,000 Units by mouth daily.     docusate sodium (COLACE) 100 MG capsule Take 1 capsule (100 mg total) by mouth 2 (two) times daily. 10 capsule 0   famotidine (PEPCID) 20 MG tablet Take 1 tablet (20 mg total) by mouth daily. 30 tablet 2   levothyroxine (SYNTHROID) 50 MCG tablet Take 1 tablet (50 mcg total) by mouth daily. 30 tablet 0   Multiple Vitamins-Minerals  (MULTIVITAMIN WITH MINERALS) tablet Take 1 tablet by mouth daily.     ondansetron (ZOFRAN-ODT) 8 MG disintegrating tablet Take 1 tablet (8 mg total) by mouth every 6 (six) hours as needed for nausea or vomiting. 30 tablet 1   phenazopyridine (PYRIDIUM) 100 MG tablet Take 1 tablet (100 mg total) by mouth 3 (three) times daily with meals. 10 tablet 0   polyethylene glycol (MIRALAX) 17 g packet Take 17 g by mouth daily. 14 each 0   senna (SENOKOT) 8.6 MG TABS tablet Take 1 tablet (8.6 mg total) by mouth daily. 120 tablet 0   No current facility-administered medications for this visit.    VITAL SIGNS: There were no vitals taken for this visit. There were no vitals filed for this visit.  Estimated body mass index is 42.34 kg/m as calculated from the following:   Height as of an earlier encounter on 12/21/21: 5' 2.5" (1.588 m).   Weight as of 11/17/21: 235 lb 3.7 oz (106.7 kg).   PERFORMANCE STATUS (ECOG) : 4 - Bedbound   Physical Exam General: Weak appearing, in wheelchair Cardiovascular: regular rate and rhythm Pulmonary: normal breathing pattern  Extremities: left upper arm and lower extremity edema, no joint deformities Skin: no rashes Neurological: Weakness, AAO x3  IMPRESSION: Denise Wright and her daughter presented to clinic today for follow-up. She was seen by Dr. Mickeal Skinner today with recommendations for hospice support. Ms. Zuleta appears much more weaker today than previous visits. She is easily awakened but falls back asleep during her visit. She is able to call me by name and appropriately recognize current situation. She is still at [facility] under rehab days. Daughter states they have a team meeting on Monday to discuss patient's return home vs continued care.   [name] is much more weaker. Family and facility staff now has to feed patient as she has limited mobility and strength in right arm. Has been pocketing food and requires prompting at times to swallow per daughter.   Extensive education provided regarding aspiration risk safety measures when receiving oral intake.  Facility is now having to crush all medications and administer with either applesauce or pudding.  Per daughter she tolerates without difficulty.  No coughing been observed at this time.  Daughter acknowledges she is sleeping more over the past week.  He has significantly increased.  Neoplasm related pain  Leandra has been taking Tylenol which was effective for her minor aches and pains. Her pain has now escalated and uncontrolled with Tylenol. We discussed use of roxanol to assist with pain management. Daughter verbalized understanding and would like for this to be available for her mother. She is tearful expressing her wishes for her mother not to be in pain or suffering. Emotional support provided.    Goals of Care Ongoing discussions regarding patient's current illness and co-morbidities. [name] is tearful in discussions regarding her mother's  decline and poor prognosis.  She is realistic in her understanding.  As discussed with Dr. Mickeal Skinner she would like for her mother to have an additional MRI understanding this would not change overall prognosis or offer any additional treatment options.  I discussed at length current recommendations for hospice support.  Patient is currently at [facility] rehabilitation days.  [name] shares they have a meeting on Monday to discuss next steps (discharging home).  Ms. Trower's wishes are to be at home amongst her family and friends.  I empathetically discussed with daughter if patient wishes are to be at home to consider discharging from facility as they are able and getting her home with hospice support.  Daughter verbalized understanding with plans to speak with her brothers and family for additional support.  She will most likely plan to transition patient home on next week.    Education provided on hospice's goals and philosophy of care.  We discussed  what care would look like in the home including symptom management.  Daughter confirms they have all necessary home equipment. They would need transportation to get [place] home once she discharges from facility. Patient is established with [facility]'s outpatient palliative team. [name] is aware care can transition to hospice focused care once family is ready and patient is coming home. She will notify the medical team once decisions are made in the next few days.   6/27-MOST form completed  11/21/21: We discussed her current illness and what it means in the larger context of Her on-going co-morbidities. Natural disease trajectory and expectations were discussed.   They are taking things day by day.   I empathetically approached topics regarding advanced directives, current code status, and education provided on MOST form. Electra confirms she does have a documented advanced directive. Her daughter is her main health care Media planner. She is planning to review documents as it has been some years since she completed to make sure no updates are needed.   She confirms wishes for DNR/DNI   I discussed the importance of continued conversation with family and their medical providers regarding overall plan of care and treatment options, ensuring decisions are within the context of the patients values and GOCs.  PLAN: Roxanol 40m as needed for pain Ativan as needed for seizure like activity Zofran as needed for nausea  DNR/DNI, MOST form has been completed.  Miralax and Senna daily for bowel regimen Patient has documented advanced directives. Her daughter, SDavy Piqueis her HCPOA.  Ongoing goals of care and support. Patient is established with outpatient Palliative ([facility]). Daughter leaning towards transitioning to hospice and getting patient home to allow her to spend what time she has with family and friends as desired. Aware she is at risk of sudden death at facility. Goal would be to discharge home  first of next week if final decision is to move forward with hospice. Daughter plans to contact office with decisions.  [facility] will [facility] to [facility] with final decisions   Patient expressed understanding and was in agreement with this plan. She also understands that She can call the clinic at any time with any questions, concerns, or complaints.   Thank you for your referral and allowing Palliative to assist in Mrs. KHannah BeatNorcott's care.    Any controlled substances utilized were prescribed in the context of palliative care. PDMP has been reviewed.    Time Total: 45 min   Visit consisted of  counseling and education dealing with the complex and emotionally intense issues of symptom management and palliative care in the setting of serious and potentially life-threatening illness.Greater than 50%  of this time was spent counseling and coordinating care related to the above assessment and plan.  Alda Lea, AGPCNP-BC  Palliative Medicine Team/[place]

## 2021-12-21 NOTE — Research (Signed)
[id number] A Single Arm, Pilot Study of Ramipril for Preventing Radiation-Induced Cognitive Decline in Glioblastoma (GBM) Patients Receiving Brain Radiotherapy  WITHDRAWAL FROM STUDY  Met with patient today (12/21/21) who is accompanied by daughter [name].  Received signature on withdrawal of treatment consent form today, which still allows for patient to continue to be followed and clinical data to be collected from medical records.  Patient is being removed from study per patient request, Dr. Mickeal Skinner approval d/t overall decline in status, and recent progression of disease.  Pt and family deny any questions/concerns at this time.  Wells Guiles 'Learta CoddingNeysa Bonito, RN, BSN Clinical Research Nurse I 12/21/21 9:55 AM

## 2021-12-21 NOTE — Telephone Encounter (Signed)
[employer] Disability/FMLA Cover Sheet and the [facility] Authorization for Use/Disclosure e-mailed to [employer] [email or web address].

## 2021-12-21 NOTE — Telephone Encounter (Signed)
Today, this nurse received [employer] Disability paperwork from neuro-oncology collaborative.          Unable to connect with Hannah Beat Muhs in office as all patients or patient legal representative is required to sign the "[place] for Use/Disclosure" for any means of PHI release (electronic, verbal or paper) especially to a third party and complete and sign the [facility] Disability/ FMLA Cover Sheet to assist form staff correctly process form(s).      Unable to connect with Shaniah Baltes Chason [phone]), Did not answer and unable to leave message as her mailbox is full.      Attempted to reach [place] [phone]) upon notice of palliative care collaborative reports patient left going home.  Message received x 2 "We're sorry, your call cannot be completed at this time.  Please hang up and try your call later.  Thank You."  Sending a MyChart message with authorization, cover sheet and, form guidelines to allow up to 14-calendar days to process.

## 2021-12-25 ENCOUNTER — Other Ambulatory Visit: Payer: Self-pay

## 2021-12-25 ENCOUNTER — Emergency Department (HOSPITAL_COMMUNITY): Payer: BC Managed Care – PPO

## 2021-12-25 ENCOUNTER — Observation Stay (HOSPITAL_COMMUNITY): Payer: BC Managed Care – PPO

## 2021-12-25 ENCOUNTER — Observation Stay (HOSPITAL_COMMUNITY)
Admission: EM | Admit: 2021-12-25 | Discharge: 2021-12-26 | Disposition: A | Discharge status: Hospice | Payer: BC Managed Care – PPO | Attending: Family Medicine | Admitting: Family Medicine

## 2021-12-25 ENCOUNTER — Inpatient Hospital Stay: Payer: BC Managed Care – PPO | Admitting: Nurse Practitioner

## 2021-12-25 ENCOUNTER — Encounter (HOSPITAL_COMMUNITY): Payer: Self-pay | Admitting: *Deleted

## 2021-12-25 DIAGNOSIS — Z86718 Personal history of other venous thrombosis and embolism: Secondary | ICD-10-CM | POA: Diagnosis not present

## 2021-12-25 DIAGNOSIS — E039 Hypothyroidism, unspecified: Secondary | ICD-10-CM | POA: Diagnosis not present

## 2021-12-25 DIAGNOSIS — Z86711 Personal history of pulmonary embolism: Secondary | ICD-10-CM | POA: Diagnosis not present

## 2021-12-25 DIAGNOSIS — R569 Unspecified convulsions: Secondary | ICD-10-CM | POA: Diagnosis not present

## 2021-12-25 DIAGNOSIS — Z79899 Other long term (current) drug therapy: Secondary | ICD-10-CM | POA: Diagnosis not present

## 2021-12-25 DIAGNOSIS — Z6837 Body mass index (BMI) 37.0-37.9, adult: Secondary | ICD-10-CM | POA: Diagnosis not present

## 2021-12-25 DIAGNOSIS — G936 Cerebral edema: Secondary | ICD-10-CM | POA: Diagnosis not present

## 2021-12-25 DIAGNOSIS — R29818 Other symptoms and signs involving the nervous system: Secondary | ICD-10-CM | POA: Diagnosis present

## 2021-12-25 DIAGNOSIS — Z20822 Contact with and (suspected) exposure to covid-19: Secondary | ICD-10-CM | POA: Diagnosis not present

## 2021-12-25 DIAGNOSIS — Z7189 Other specified counseling: Secondary | ICD-10-CM

## 2021-12-25 DIAGNOSIS — I10 Essential (primary) hypertension: Secondary | ICD-10-CM | POA: Diagnosis not present

## 2021-12-25 DIAGNOSIS — Z7901 Long term (current) use of anticoagulants: Secondary | ICD-10-CM

## 2021-12-25 DIAGNOSIS — D649 Anemia, unspecified: Secondary | ICD-10-CM | POA: Diagnosis not present

## 2021-12-25 DIAGNOSIS — C719 Malignant neoplasm of brain, unspecified: Secondary | ICD-10-CM | POA: Diagnosis not present

## 2021-12-25 DIAGNOSIS — Z66 Do not resuscitate: Secondary | ICD-10-CM | POA: Diagnosis not present

## 2021-12-25 DIAGNOSIS — G40209 Localization-related (focal) (partial) symptomatic epilepsy and epileptic syndromes with complex partial seizures, not intractable, without status epilepticus: Principal | ICD-10-CM | POA: Insufficient documentation

## 2021-12-25 DIAGNOSIS — Z515 Encounter for palliative care: Secondary | ICD-10-CM

## 2021-12-25 LAB — COMPREHENSIVE METABOLIC PANEL
ALT: 12 U/L (ref 0–44)
AST: 19 U/L (ref 15–41)
Albumin: 2.8 g/dL — ABNORMAL LOW (ref 3.5–5.0)
Alkaline Phosphatase: 64 U/L (ref 38–126)
Anion gap: 7 (ref 5–15)
BUN: 7 mg/dL — ABNORMAL LOW (ref 8–23)
CO2: 26 mmol/L (ref 22–32)
Calcium: 8.8 mg/dL — ABNORMAL LOW (ref 8.9–10.3)
Chloride: 106 mmol/L (ref 98–111)
Creatinine, Ser: 0.97 mg/dL (ref 0.44–1.00)
GFR, Estimated: >60 mL/min (ref >60)
Glucose, Bld: 99 mg/dL (ref 70–99)
Potassium: 3.7 mmol/L (ref 3.5–5.1)
Sodium: 139 mmol/L (ref 135–145)
Total Bilirubin: 0.7 mg/dL (ref 0.3–1.2)
Total Protein: 6.5 g/dL (ref 6.5–8.1)

## 2021-12-25 LAB — RESP PANEL BY RT-PCR (FLU A&B, COVID) ARPGX2
Influenza A by PCR: NEGATIVE
Influenza B by PCR: NEGATIVE
SARS Coronavirus 2 by RT PCR: NEGATIVE

## 2021-12-25 LAB — DIFFERENTIAL
Abs Immature Granulocytes: 0.02 10*3/uL (ref 0.00–0.07)
Basophils Absolute: 0.1 10*3/uL (ref 0.0–0.1)
Basophils Relative: 1 %
Eosinophils Absolute: 0.2 10*3/uL (ref 0.0–0.5)
Eosinophils Relative: 3 %
Immature Granulocytes: 0 %
Lymphocytes Relative: 16 %
Lymphs Abs: 0.8 10*3/uL (ref 0.7–4.0)
Monocytes Absolute: 0.7 10*3/uL (ref 0.1–1.0)
Monocytes Relative: 14 %
Neutro Abs: 3.5 10*3/uL (ref 1.7–7.7)
Neutrophils Relative %: 66 %

## 2021-12-25 LAB — CBC
HCT: 33.3 % — ABNORMAL LOW (ref 36.0–46.0)
Hemoglobin: 10.4 g/dL — ABNORMAL LOW (ref 12.0–15.0)
MCH: 30.5 pg (ref 26.0–34.0)
MCHC: 31.2 g/dL (ref 30.0–36.0)
MCV: 97.7 fL (ref 80.0–100.0)
Platelets: 296 10*3/uL (ref 150–400)
RBC: 3.41 MIL/uL — ABNORMAL LOW (ref 3.87–5.11)
RDW: 13.4 % (ref 11.5–15.5)
WBC: 5.3 10*3/uL (ref 4.0–10.5)
nRBC: 0 % (ref 0.0–0.2)

## 2021-12-25 LAB — PROTIME-INR
INR: 1.5 — ABNORMAL HIGH (ref 0.8–1.2)
Prothrombin Time: 18.2 seconds — ABNORMAL HIGH (ref 11.4–15.2)

## 2021-12-25 LAB — APTT: aPTT: 30 seconds (ref 24–36)

## 2021-12-25 LAB — ETHANOL: Alcohol, Ethyl (B): <10 mg/dL (ref <10)

## 2021-12-25 MED ORDER — ONDANSETRON HCL 4 MG/2ML IJ SOLN: 4.0000 mg | Freq: Four times a day (QID) | INTRAMUSCULAR | Status: DC | PRN

## 2021-12-25 MED ORDER — ONDANSETRON HCL 4 MG PO TABS: 4.0000 mg | ORAL_TABLET | Freq: Four times a day (QID) | ORAL | Status: DC | PRN

## 2021-12-25 MED ORDER — BIOTENE DRY MOUTH MT LIQD: 15.0000 mL | OROMUCOSAL | Status: DC | PRN

## 2021-12-25 MED ORDER — GLYCOPYRROLATE 0.2 MG/ML IJ SOLN: 0.3000 mg | INTRAMUSCULAR | Status: DC | PRN

## 2021-12-25 MED ORDER — APIXABAN 5 MG PO TABS: 5.0000 mg | ORAL_TABLET | Freq: Two times a day (BID) | ORAL | Status: DC

## 2021-12-25 MED ORDER — PHENAZOPYRIDINE HCL 100 MG PO TABS: 100.0000 mg | ORAL_TABLET | Freq: Three times a day (TID) | ORAL | Status: DC

## 2021-12-25 MED ORDER — LORATADINE 10 MG PO TABS: 10.0000 mg | ORAL_TABLET | Freq: Every day | ORAL | Status: DC

## 2021-12-25 MED ORDER — SODIUM CHLORIDE 0.9 % IV SOLN: INTRAVENOUS | Status: AC

## 2021-12-25 MED ORDER — DEXAMETHASONE SODIUM PHOSPHATE 4 MG/ML IJ SOLN
4.0000 mg | Freq: Four times a day (QID) | INTRAMUSCULAR | Status: DC
  Administered 2021-12-25: 4 mg via INTRAVENOUS
  Filled 2021-12-25: qty 1

## 2021-12-25 MED ORDER — POLYETHYLENE GLYCOL 3350 17 G PO PACK: 17.0000 g | PACK | Freq: Every day | ORAL | Status: DC | PRN

## 2021-12-25 MED ORDER — SODIUM CHLORIDE 0.9% FLUSH
3.0000 mL | Freq: Two times a day (BID) | INTRAVENOUS | Status: DC
Start: 2021-12-25 — End: 2021-12-26
  Administered 2021-12-26: 3 mL via INTRAVENOUS

## 2021-12-25 MED ORDER — HYDRALAZINE HCL 20 MG/ML IJ SOLN: 10.0000 mg | INTRAMUSCULAR | Status: DC | PRN

## 2021-12-25 MED ORDER — LORAZEPAM 2 MG/ML IJ SOLN: 1.0000 mg | INTRAMUSCULAR | Status: DC | PRN

## 2021-12-25 MED ORDER — GADOBUTROL 1 MMOL/ML IV SOLN
9.0000 mL | Freq: Once | INTRAVENOUS | Status: AC | PRN
  Administered 2021-12-25: 9 mL via INTRAVENOUS

## 2021-12-25 MED ORDER — LEVETIRACETAM IN NACL 500 MG/100ML IV SOLN
500.0000 mg | Freq: Two times a day (BID) | INTRAVENOUS | Status: DC
  Administered 2021-12-25 – 2021-12-26 (×2): 500 mg via INTRAVENOUS
  Filled 2021-12-25 (×4): qty 100

## 2021-12-25 MED ORDER — AMLODIPINE BESYLATE 5 MG PO TABS: 5.0000 mg | ORAL_TABLET | Freq: Every day | ORAL | Status: DC

## 2021-12-25 MED ORDER — ACETAMINOPHEN 325 MG PO TABS: 650.0000 mg | ORAL_TABLET | Freq: Four times a day (QID) | ORAL | Status: DC | PRN

## 2021-12-25 MED ORDER — ACETAMINOPHEN 650 MG RE SUPP: 650.0000 mg | Freq: Four times a day (QID) | RECTAL | Status: DC | PRN

## 2021-12-25 MED ORDER — MORPHINE SULFATE (CONCENTRATE) 20 MG/ML PO SOLN: 5.0000 mg | ORAL | Status: DC | PRN

## 2021-12-25 MED ORDER — POLYVINYL ALCOHOL 1.4 % OP SOLN
1.0000 [drp] | Freq: Four times a day (QID) | OPHTHALMIC | Status: DC | PRN
  Filled 2021-12-25: qty 15

## 2021-12-25 MED ORDER — LEVETIRACETAM IN NACL 1500 MG/100ML IV SOLN
1500.0000 mg | Freq: Once | INTRAVENOUS | Status: AC
  Administered 2021-12-25: 1500 mg via INTRAVENOUS
  Filled 2021-12-25: qty 100

## 2021-12-25 MED ORDER — ENSURE PLUS PO LIQD: 237.0000 mL | Freq: Two times a day (BID) | ORAL | Status: DC

## 2021-12-25 MED ORDER — ALBUTEROL SULFATE (2.5 MG/3ML) 0.083% IN NEBU: 2.5000 mg | INHALATION_SOLUTION | Freq: Four times a day (QID) | RESPIRATORY_TRACT | Status: DC | PRN

## 2021-12-25 MED ORDER — BISACODYL 5 MG PO TBEC
10.0000 mg | DELAYED_RELEASE_TABLET | Freq: Every day | ORAL | Status: DC | PRN
Start: 2021-12-25 — End: 2021-12-26

## 2021-12-25 MED ORDER — LEVOTHYROXINE SODIUM 50 MCG PO TABS
50.0000 ug | ORAL_TABLET | Freq: Every day | ORAL | Status: DC
Start: 2021-12-25 — End: 2021-12-25

## 2021-12-25 MED ORDER — SENNA 8.6 MG PO TABS: 1.0000 | ORAL_TABLET | Freq: Every day | ORAL | Status: DC

## 2021-12-25 MED ORDER — MORPHINE SULFATE (PF) 2 MG/ML IV SOLN
2.0000 mg | INTRAVENOUS | Status: DC | PRN
  Administered 2021-12-26: 2 mg via INTRAVENOUS
  Filled 2021-12-25: qty 1

## 2021-12-25 MED ORDER — FAMOTIDINE IN NACL 20-0.9 MG/50ML-% IV SOLN
20.0000 mg | INTRAVENOUS | Status: DC
  Administered 2021-12-25: 20 mg via INTRAVENOUS
  Filled 2021-12-25: qty 50

## 2021-12-25 MED ORDER — DEXAMETHASONE SODIUM PHOSPHATE 4 MG/ML IJ SOLN
4.0000 mg | Freq: Every day | INTRAMUSCULAR | Status: DC
  Administered 2021-12-26: 4 mg via INTRAVENOUS
  Filled 2021-12-25: qty 1

## 2021-12-25 NOTE — ED Notes (Signed)
The patient was difficult to arouse, but this EMT was able to get a response from the patient by sternal rubbing for a response to painful stimuli

## 2021-12-25 NOTE — ED Notes (Signed)
Patient transported to MRI 

## 2021-12-25 NOTE — ED Provider Notes (Signed)
  Physical Exam  BP 140/88   Pulse 77   Temp 98.7 F (37.1 C) (Oral)   Resp 17   Wt 95.2 kg   SpO2 94%   BMI 37.78 kg/m   Physical Exam Vitals and nursing note reviewed.  Constitutional:      General: She is not in acute distress.    Appearance: She is well-developed.  HENT:     Head: Normocephalic and atraumatic.  Eyes:     Conjunctiva/sclera: Conjunctivae normal.  Cardiovascular:     Rate and Rhythm: Normal rate and regular rhythm.     Heart sounds: No murmur heard. Pulmonary:     Effort: Pulmonary effort is normal. No respiratory distress.  Musculoskeletal:        General: No swelling.     Cervical back: Neck supple.  Skin:    General: Skin is warm and dry.     Capillary Refill: Capillary refill takes less than 2 seconds.  Neurological:     Mental Status: She is alert.  Psychiatric:        Mood and Affect: Mood normal.     Procedures  Procedures  ED Course / MDM    Medical Decision Making Risk Prescription drug management.   Patient received in handoff.  History of GBM that was an initial stroke alert, ultimately stroke alert canceled and patient will ultimately require work-up for EEG and MRI secondary to seizure +/-Todd's paralysis.  Pending laboratory work-up at time of signout.  Laboratory work-up with a hemoglobin of 10.4, INR 1.5 but is otherwise unremarkable.  Patient Keppra loaded by previous provider.  Patient then admitted for MRI and EEG       Licet Dunphy, Debe Coder, MD 12/25/21 [phone]

## 2021-12-25 NOTE — Consult Note (Signed)
Neurology Consultation Reason for Consult: Stroke  Referring Physician: Rudean Haskell  CC: Seizure  History is obtained from:Patient, nursing home staff  HPI: Denise Wright is a 71 y.o. female with a history of seizure who presents with increased left-sided weakness after an episode of jerking.  She was last seen in her normal state of health at 8 PM, and when they went in to check on her this morning they found her to have left-sided jerking of the left arm and leg.  She was awake and able to talk while jerking.  The jerking lasted for 5 to 10 minutes and then stopped.  The patient states that she does not remember that at all, but remembers someone "waking her up" and telling her that EMS was on the way.  She does not know why anyone called EMS.  At baseline she has 4/5 weakness of the left, does not have any history of seizure.   LKW: 8 pm  tpa given?: no, out of window  Past Medical History:  Diagnosis Date   Glioblastoma ([place])    Hyperlipidemia    Hypertension    Pneumonia    Thyroid disease      Family History  Problem Relation Age of Onset   Diabetes Mother    Hypertension Mother    Hypertension Father    Leukemia Father    Stroke Father    Cancer Brother    Pneumonia Brother      Social History:  reports that she has never smoked. She has never used smokeless tobacco. She reports that she does not drink alcohol and does not use drugs.   Exam: Current vital signs: BP (!) 149/79   Pulse 90   Temp 98.5 F (36.9 C)   Resp 19   SpO2 95%  Vital signs in last 24 hours: Temp:  [98.5 F (36.9 C)] 98.5 F (36.9 C) (07/17 0600) Pulse Rate:  [90] 90 (07/17 0600) Resp:  [19] 19 (07/17 0600) BP: (149)/(79) 149/79 (07/17 0600) SpO2:  [95 %] 95 % (07/17 0600)   Physical Exam  Constitutional: Appears well-developed and well-nourished.   Neuro: Mental Status: Patient is awake, alert, able to answer questions and follow commands readily No signs of aphasia, she  does have some left-sided neglect Cranial Nerves: II: Visual Fields are full. Pupils are equal, round, and reactive to light.   III,IV, VI: She has a right gaze preference but is able to cross midline to the left V: Facial sensation is symmetric to touch VII: Facial movement is weak on the left Motor: She gives poor effort in the right leg, 5/5 in the right arm, 3/5 in the left arm and leg Sensory: Sensation is symmetric to light touch and temperature in the arms and legs, but she does extinguish to double simultaneous stimulation Cerebellar: No ataxia in the right arm   I have reviewed labs in epic and the results pertinent to this consultation are: Creatinine is pending  I have reviewed the images obtained: CT head-large right-sided tumor, though slightly decreased edema since last time  Impression: 71 year old female with glioblastoma and now with new onset partial seizures.  She does have significant edema, but is not on Decadron at baseline, and is in the process of moving towards hospice.   She has increased left-sided weakness, and it is not unusual to have persistent Todd's phenomenon in the setting of a structural lesion, but I do think an EEG to ensure she is not in partial  status would be prudent.  Recommendations: 1) Keppra 1500x1, and as long as creatinine is normal would start Keppra 500 mg twice daily 2) MRI brain 3) EEG given persistent deficits   Roland Rack, MD [facility] [phone]  If 7pm- 7am, please page neurology on call as listed in [place].

## 2021-12-25 NOTE — ED Notes (Signed)
CT complete

## 2021-12-25 NOTE — ED Provider Notes (Signed)
[facility] EMERGENCY DEPARTMENT Provider Note   CSN: [id number] Arrival date & time: 12/25/21  0533     History  Chief Complaint  Patient presents with   Code Stroke    Denise Wright is a 71 y.o. female.  The history is provided by the patient, the EMS personnel, the nursing home and medical records.   Denise Wright is a 71 y.o. female who presents to the Emergency Department complaining of code stroke. She presents to the ED for evaluation as a code stroke.  She presents to the ED by EMS as a CODE stroke for left sided weakness.  Last known well at 8pm last night.  She is a resident at [facility].  Staff found her with about ten minutes of shaking to left arm and leg with associated slurred speech.  EMS report left sided weakness, neglect, right sided gaze deviation.  Has a hx/o PE, GBM.    Home Medications Prior to Admission medications   Medication Sig Start Date End Date Taking? Authorizing Provider  acetaminophen (TYLENOL) 325 MG tablet Take 2 tablets (650 mg total) by mouth every 6 (six) hours as needed for mild pain (or Fever >/= 101). 07/21/21   Angiulli, Lavon Paganini, PA-C  ALPRAZolam Duanne Moron) 0.25 MG tablet Take 1 tablet (0.25 mg total) by mouth at bedtime as needed for anxiety or sleep. Patient not taking: Reported on 12/21/2021 11/17/21   Damita Lack, MD  amLODipine (NORVASC) 5 MG tablet Take 5 mg by mouth daily.    [provider]  apixaban (ELIQUIS) 5 MG TABS tablet Take 1 tablet (5 mg total) by mouth 2 (two) times daily. 11/17/21   Amin, Jeanella Flattery, MD  b complex vitamins capsule Take 1 capsule by mouth daily.    [provider]  bisacodyl (DULCOLAX) 5 MG EC tablet Take 2 tablets (10 mg total) by mouth daily as needed for moderate constipation. 11/17/21   Amin, Jeanella Flattery, MD  cetirizine (ZYRTEC) 10 MG tablet Take 10 mg by mouth daily.    [provider]  cholecalciferol (VITAMIN D) 1000 UNITS tablet Take 2,000  Units by mouth daily.    [provider]  docusate sodium (COLACE) 100 MG capsule Take 1 capsule (100 mg total) by mouth 2 (two) times daily. 07/21/21   Angiulli, Lavon Paganini, PA-C  famotidine (PEPCID) 20 MG tablet Take 1 tablet (20 mg total) by mouth daily. 08/31/21   Ventura Sellers, MD  levothyroxine (SYNTHROID) 50 MCG tablet Take 1 tablet (50 mcg total) by mouth daily. 07/21/21   Angiulli, Lavon Paganini, PA-C  LORazepam (ATIVAN) 0.5 MG tablet Take 1-2 tablets (0.5-1 mg total) by mouth every 6 (six) hours as needed for anxiety or seizure. 12/21/21   Pickenpack-Cousar, Carlena Sax, NP  morphine (ROXANOL) 20 MG/ML concentrated solution Take 0.25-0.5 mLs (5-10 mg total) by mouth every 2 (two) hours as needed for severe pain, moderate pain or shortness of breath. 12/21/21   Pickenpack-Cousar, Carlena Sax, NP  Multiple Vitamins-Minerals (MULTIVITAMIN WITH MINERALS) tablet Take 1 tablet by mouth daily.    [provider]  ondansetron (ZOFRAN-ODT) 8 MG disintegrating tablet Take 1 tablet (8 mg total) by mouth every 6 (six) hours as needed for nausea or vomiting. 12/21/21   Pickenpack-Cousar, Carlena Sax, NP  phenazopyridine (PYRIDIUM) 100 MG tablet Take 1 tablet (100 mg total) by mouth 3 (three) times daily with meals. 11/17/21   Amin, Jeanella Flattery, MD  polyethylene glycol (MIRALAX) 17  g packet Take 17 g by mouth daily. 11/21/21   Ventura Sellers, MD  senna (SENOKOT) 8.6 MG TABS tablet Take 1 tablet (8.6 mg total) by mouth daily. 11/21/21   Ventura Sellers, MD      Allergies    Fexofenadine and Penicillins    Review of Systems   Review of Systems  All other systems reviewed and are negative.   Physical Exam Updated Vital Signs BP (!) 149/79   Pulse 90   Temp 98.5 F (36.9 C)   Resp 19   Wt 95.2 kg   SpO2 95%   BMI 37.78 kg/m  Physical Exam Vitals and nursing note reviewed.  Constitutional:      Appearance: She is well-developed.  HENT:     Head: Normocephalic and atraumatic.   Cardiovascular:     Rate and Rhythm: Normal rate and regular rhythm.     Heart sounds: No murmur heard. Pulmonary:     Effort: Pulmonary effort is normal. No respiratory distress.     Breath sounds: Normal breath sounds.  Abdominal:     Palpations: Abdomen is soft.     Tenderness: There is no abdominal tenderness. There is no guarding or rebound.  Musculoskeletal:        General: No tenderness.     Comments: Nonpitting edema to BLE  Skin:    General: Skin is warm and dry.  Neurological:     Mental Status: She is alert and oriented to person, place, and time.     Comments: Left upper extremity 3/5, LLE 2/5.  Clonus to LUE/LLE.  Left facial droop.    Psychiatric:        Behavior: Behavior normal.     ED Results / Procedures / Treatments   Labs (all labs ordered are listed, but only abnormal results are displayed) Labs Reviewed  RESP PANEL BY RT-PCR (FLU A&B, COVID) ARPGX2  ETHANOL  PROTIME-INR  APTT  CBC  DIFFERENTIAL  COMPREHENSIVE METABOLIC PANEL  RAPID URINE DRUG SCREEN, HOSP PERFORMED  URINALYSIS, ROUTINE W REFLEX MICROSCOPIC  I-STAT CHEM 8, ED    EKG None  Radiology CT HEAD CODE STROKE WO CONTRAST  Result Date: 12/25/2021 CLINICAL DATA:  Code stroke. 71 year old female neurologic deficit. History of glioblastoma status post resection, chemo radiation. EXAM: CT HEAD WITHOUT CONTRAST TECHNIQUE: Contiguous axial images were obtained from the base of the skull through the vertex without intravenous contrast. RADIATION DOSE REDUCTION: This exam was performed according to the departmental dose-optimization program which includes automated exposure control, adjustment of the mA and/or kV according to patient size and/or use of iterative reconstruction technique. COMPARISON:  Restaging brain MRI 10/06/2021.  Head CT 11/10/2021. FINDINGS: Brain: Right hemisphere tumoral edema persists, with extensive frontal and temporal lobe involvement. Continued mass effect on the right  lateral ventricle, although perhaps slightly regressed compared to 10/06/2021. And since 11/10/2021, leftward midline shift has substantially improved, 3-4 mm now versus up to 11 mm last month. Basilar cistern patency has improved. No superimposed No acute intracranial hemorrhage identified. No superimposed acute cortically based infarct is evident. Vascular: Calcified atherosclerosis at the skull base. No suspicious intracranial vascular hyperdensity. Skull: Stable right frontotemporal craniotomy. Sinuses/Orbits: Visualized paranasal sinuses and mastoids are stable and well aerated. Other: No acute orbit or scalp soft tissue finding. ASPECTS [facility] Stroke Program Early CT Score) Total score (0-10 with 10 being normal): Infiltrative brain tumor. IMPRESSION: 1. Extensive infiltrative right hemisphere brain tumor appears regressed since 11/10/2021. 2. No superimposed acute cortically  based infarct or acute intracranial hemorrhage identified. 3. These results were communicated to Dr. Leonel Ramsay at 6:05 am on 12/25/2021 by text page via the [facility] messaging system. Electronically Signed   By: Genevie Ann M.D.   On: 12/25/2021 06:05    Procedures Procedures    Medications Ordered in ED Medications  levETIRAcetam (KEPPRA) IVPB 1500 mg/ 100 mL premix (has no administration in time range)    ED Course/ Medical Decision Making/ A&P                           Medical Decision Making Risk Prescription drug management.   Pt with hx/o PE on anticoagulation, GBM here as a code stroke for left sided weakness.  Patient with improving sxs at time of arrival.  Hx and exam more c/w focal seizure.  She has been seen by Neurology.  Plan to load with keppra.  Code stroke canceled. CT scan improved compared to priors.  Plan to admit for EEG/MRI once labs return.  Pt care transferred pending labs.          Final Clinical Impression(s) / ED Diagnoses Final diagnoses:  None    Rx / DC Orders ED Discharge Orders      None         Quintella Reichert, MD 12/25/21 [phone]

## 2021-12-25 NOTE — Progress Notes (Signed)
[employer] [facility]) Hospital Liaison Note  Referral received for patient/family interest in [facility]. Chart under review by [facility] physician.   Hospice eligibility pending.   Bedside assessment will take place tomorrow.   Please call with any questions or concerns. Thank you  Roselee Nova, [facility] Liaison [phone]

## 2021-12-25 NOTE — Procedures (Signed)
Patient Name: Denise Wright  MRN: [id number]  Epilepsy Attending: Lora Havens  Referring Physician/Provider: Greta Doom, MD  Date: 12/25/2021 Duration: 22.53 mins  Patient history: 71 year old female with glioblastoma and now with new onset partial seizures. EEG to evaluate for seizure  Level of alertness: Awake, drowsy  AEDs during EEG study: LEV  Technical aspects: This EEG study was done with scalp electrodes positioned according to the 10-20 International system of electrode placement. Electrical activity was acquired at a sampling rate of '500Hz'$  and reviewed with a high frequency filter of '70Hz'$  and a low frequency filter of '1Hz'$ . EEG data were recorded continuously and digitally stored.   Description: The posterior dominant rhythm consists of 8 Hz activity of moderate voltage (25-35 uV) seen predominantly in posterior head regions, asymmetric ( right<left) and reactive to eye opening and eye closing. Drowsiness was characterized by attenuation of the posterior background rhythm. EEG showed continuous 3-'5hz'$  theta-delta slowing in right hemisphere. Frequent sharp waves are also noted in right hemisphere.  Hyperventilation and photic stimulation were not performed.     ABNORMALITY - Sharp wave, right hemisphere - Continuous slow, right hemisphere - Background asymmetry, right<left  IMPRESSION: This study showed evidence of epileptogenicity and cortical dysfunction arising from right hemisphere. No seizures were seen throughout the recording.  Skyeler Scalese Barbra Sarks

## 2021-12-25 NOTE — ED Triage Notes (Addendum)
Arrives as Code stroke to the bridge, arrives to full Stroke team. LSN 2000. Found on rounding this am around [place]. Aspirin given at [place]. Facility noted R facial droop and slurred speech. EMS noted R facial droop, mumbling, L upper and lower extremity weakness and gaze would not cross midline to the Left. EMS unable to get IV. Lab unable to get blood at the bridge. EMS LVO score 6. Takes eliquis. Pt arrives alert, NAD, calm, interactive, following, with L sided deficits. EMS reported difficulty with exam d/t pt became angry and then would not answer questions. Verbalizes difficulty assessing whether responses were medical, or behavioral or attitude.

## 2021-12-25 NOTE — Progress Notes (Addendum)
Same day progress note, no charge  Subjective: Discussed with daughter at bedside.  She notes patient was having frequent episodes of shaking that would start on the left arm and then move over to the right side, never greater than 5 minutes but sometimes less than 5 minutes between episodes.  She reports that they have been struggling with the decision to transition to hospice, but is aware that there are no oncological treatment options for her mother's malignancy.  She and Dr. Mickeal Skinner both note that the patient had significant peripheral edema on Decadron when previously trialed.   Patient reports no complaints, no headache  On examination, patient appears has some neglect of the left side, with a right gaze preference.  She is sleepy but alerts quickly to voice and is able to identify her daughter at bedside.  She is able to withdraw to light nailbed stimulation in the left upper extremity and moves the right upper extremity freely antigravity  MRI brain personally reviewed, agree with radiology: Known extensive infiltrative right cerebral hemispheric brain tumor status post partial resection. Since the prior MRI of 10/06/2021, restricted diffusion along the resection cavity margins has increased in conspicuity, and there is a new irregular focus of restricted diffusion extending into the right corona radiata measuring 4.8 x 2.1 x 2.3 cm. This progressive restricted diffusion may reflect tumor necrosis (related to Avastin therapy) or tumor progression. An acute infarct within the right corona radiata cannot be excluded. Extensive T2 FLAIR hyperintense signal abnormality throughout much of the right cerebral hemisphere, with interval progression of signal abnormality in the anterior right frontal lobe and within the right parietal lobe. Mass effect with partial effacement the right lateral ventricle and 4 mm leftward midline shift.   Multifocal T2 FLAIR hyperintense signal abnormality  within the left cerebral white matter, pons and bilateral cerebellar white matter. Although nonspecific, findings likely reflect a combination of chronic small vessel ischemic disease and treatment-related changes.  Impression: Likely progressive glioblastoma with worsening edema in the setting of being off of steroids.  Focal seizures with prolonged Todd's phenomenon +/- element of progressive weakness from the brain lesion and edema  Plan: -Decadron 4 mg daily, may be tapered to 2 mg a daily if patient is having severe peripheral edema, ordered IV for now given patient's somnolence.  May be transitioned to oral when patient is tolerating p.o.  Again given her prior intolerance of dexamethasone, despite a slight dose reduction when converting to oral, would recommend conversion at one-to-one ratio -Keppra 1500 mg load completed, will then start 500 mg BID.  May increase up to 1 g every 12 hours if needed for seizure control based on current creatinine clearance  May be transitioned 1:1 to oral when patient tolerating Estimated Creatinine Clearance: 58.7 mL/min (by C-G formula based on SCr of 0.97 mg/dL).  -Recommend continuing antiseizure medication even if patient is on hospice, as seizures are very uncomfortable -Palliative care consult -Seizure precautions reviewed with family at bedside, they note that the patient is not driving and does have 24/7 assistance in place -Neurology will follow-up EEG but otherwise will be available on an as-needed basis, please reach out if patient is having further seizure activity  Discussed with Dr. Mickeal Skinner and primary team via secure chat, and daughter at bedside.

## 2021-12-25 NOTE — ED Notes (Signed)
ED TO INPATIENT HANDOFF REPORT  ED Nurse Name and Phone #: [phone]  S Name/Age/Gender Denise Wright 71 y.o. female Room/Bed: 043C/043C  Code Status   Code Status: DNR  Home/SNF/Other UNDECIDED Patient oriented to: self Is this baseline? No   Triage Complete: Triage complete  Chief Complaint Seizure-like activity [facility]) [R56.9]  Triage Note Arrives as Code stroke to the bridge, arrives to full Stroke team. LSN 2000. Found on rounding this am around [place]. Aspirin given at [place]. Facility noted R facial droop and slurred speech. EMS noted R facial droop, mumbling, L upper and lower extremity weakness and gaze would not cross midline to the Left. EMS unable to get IV. Lab unable to get blood at the bridge. EMS LVO score 6. Takes eliquis. Pt arrives alert, NAD, calm, interactive, following, with L sided deficits. EMS reported difficulty with exam d/t pt became angry and then would not answer questions. Verbalizes difficulty assessing whether responses were medical, or behavioral or attitude.     Allergies Allergies  Allergen Reactions   Fexofenadine Hives   Penicillins Itching and Other (See Comments)    Pulling sensation in body    Level of Care/Admitting Diagnosis ED Disposition     ED Disposition  Admit   Condition  --   [place]: [place] [[id number]]  Level of Care: Telemetry Medical [104]  May place patient in observation at [facility] or [place] if equivalent level of care is available:: No  Covid Evaluation: Asymptomatic - no recent exposure (last 10 days) testing not required  Diagnosis: Seizure-like activity [facility]) [[id number]]  Admitting Physician: Norval Morton [[id number]]  Attending Physician: Norval Morton [[id number]]          B Medical/Surgery History Past Medical History:  Diagnosis Date   Glioblastoma ([place])    Hyperlipidemia    Hypertension    Pneumonia    Thyroid disease    Past Surgical History:  Procedure  Laterality Date   ABDOMINAL HYSTERECTOMY  1324   APPLICATION OF CRANIAL NAVIGATION Right 07/06/2021   Procedure: APPLICATION OF CRANIAL NAVIGATION;  Surgeon: Karsten Ro, DO;  Location: [place];  Service: Neurosurgery;  Laterality: Right;   CRANIOTOMY Right 07/06/2021   Procedure: CRANIOTOMY TUMOR EXCISION;  Surgeon: Karsten Ro, DO;  Location: [place];  Service: Neurosurgery;  Laterality: Right;   TONSILLECTOMY  1965   TOOTH EXTRACTION       A IV Location/Drains/Wounds Patient Lines/Drains/Airways Status     Active Line/Drains/Airways     Name Placement date Placement time Site Days   Peripheral IV 12/25/21 18 G 1.88" Distal;Left;Upper;Medial Arm 12/25/21  0609  Arm  less than 1   Wound / Incision (Open or Dehisced) 11/11/21 Other (Comment) Hip Left Healing wound to left hip, no drainage seen 11/11/21  0040  Hip  44            Intake/Output Last 24 hours No intake or output data in the 24 hours ending 12/25/21 1925  Labs/Imaging Results for orders placed or performed during the hospital encounter of 12/25/21 (from the past 48 hour(s))  Ethanol     Status: None   Collection Time: 12/25/21  6:11 AM  Result Value Ref Range   Alcohol, Ethyl (B) <10 <10 mg/dL    Comment: (NOTE) Lowest detectable limit for serum alcohol is 10 mg/dL.  For medical purposes only. Performed at [facility], [place] [address], [place], [place] [address]   Protime-INR  Status: Abnormal   Collection Time: 12/25/21  6:11 AM  Result Value Ref Range   Prothrombin Time 18.2 (H) 11.4 - 15.2 seconds   INR 1.5 (H) 0.8 - 1.2    Comment: (NOTE) INR goal varies based on device and disease states. Performed at [facility], [place] [address] [place], [place] [address]   APTT     Status: None   Collection Time: 12/25/21  6:11 AM  Result Value Ref Range   aPTT 30 24 - 36 seconds    Comment: Performed at [facility] [address], [place], [place] [address]  CBC     Status: Abnormal    Collection Time: 12/25/21  6:11 AM  Result Value Ref Range   WBC 5.3 4.0 - 10.5 K/uL   RBC 3.41 (L) 3.87 - 5.11 MIL/uL   Hemoglobin 10.4 (L) 12.0 - 15.0 g/dL   HCT 33.3 (L) 36.0 - 46.0 %   MCV 97.7 80.0 - 100.0 fL   MCH 30.5 26.0 - 34.0 pg   MCHC 31.2 30.0 - 36.0 g/dL   RDW 13.4 11.5 - 15.5 %   Platelets 296 150 - 400 K/uL   nRBC 0.0 0.0 - 0.2 %    Comment: Performed at [facility], [place] [address], [place], [place] [address]  Differential     Status: None   Collection Time: 12/25/21  6:11 AM  Result Value Ref Range   Neutrophils Relative % 66 %   Neutro Abs 3.5 1.7 - 7.7 K/uL   Lymphocytes Relative 16 %   Lymphs Abs 0.8 0.7 - 4.0 K/uL   Monocytes Relative 14 %   Monocytes Absolute 0.7 0.1 - 1.0 K/uL   Eosinophils Relative 3 %   Eosinophils Absolute 0.2 0.0 - 0.5 K/uL   Basophils Relative 1 %   Basophils Absolute 0.1 0.0 - 0.1 K/uL   Immature Granulocytes 0 %   Abs Immature Granulocytes 0.02 0.00 - 0.07 K/uL    Comment: Performed at [facility], [place] [address], [place], [place] [address]  Comprehensive metabolic panel     Status: Abnormal   Collection Time: 12/25/21  6:11 AM  Result Value Ref Range   Sodium 139 135 - 145 mmol/L   Potassium 3.7 3.5 - 5.1 mmol/L   Chloride 106 98 - 111 mmol/L   CO2 26 22 - 32 mmol/L   Glucose, Bld 99 70 - 99 mg/dL    Comment: Glucose reference range applies only to samples taken after fasting for at least 8 hours.   BUN 7 (L) 8 - 23 mg/dL   Creatinine, Ser 0.97 0.44 - 1.00 mg/dL   Calcium 8.8 (L) 8.9 - 10.3 mg/dL   Total Protein 6.5 6.5 - 8.1 g/dL   Albumin 2.8 (L) 3.5 - 5.0 g/dL   AST 19 15 - 41 U/L   ALT 12 0 - 44 U/L   Alkaline Phosphatase 64 38 - 126 U/L   Total Bilirubin 0.7 0.3 - 1.2 mg/dL   GFR, Estimated >60 >60 mL/min    Comment: (NOTE) Calculated using the CKD-EPI Creatinine Equation (2021)    Anion gap 7 5 - 15    Comment: Performed at [facility], [place] [address], [place], [place] [address]  Resp  Panel by RT-PCR (Flu A&B, Covid) Anterior Nasal Swab     Status: None   Collection Time: 12/25/21  9:30 AM   Specimen: Anterior Nasal Swab  Result  Value Ref Range   SARS Coronavirus 2 by RT PCR NEGATIVE NEGATIVE    Comment: (NOTE) SARS-CoV-2 target nucleic acids are NOT DETECTED.  The SARS-CoV-2 RNA is generally detectable in upper respiratory specimens during the acute phase of infection. The lowest concentration of SARS-CoV-2 viral copies this assay can detect is 138 copies/mL. A negative result does not preclude SARS-Cov-2 infection and should not be used as the sole basis for treatment or other patient management decisions. A negative result may occur with  improper specimen collection/handling, submission of specimen other than nasopharyngeal swab, presence of viral mutation(s) within the areas targeted by this assay, and inadequate number of viral copies(<138 copies/mL). A negative result must be combined with clinical observations, patient history, and epidemiological information. The expected result is Negative.  Fact Sheet for Patients:  EntrepreneurPulse.com.au  Fact Sheet for Healthcare Providers:  IncredibleEmployment.be  This test is no t yet approved or cleared by the [place] FDA and  has been authorized for detection and/or diagnosis of SARS-CoV-2 by FDA under an Emergency Use Authorization (EUA). This EUA will remain  in effect (meaning this test can be used) for the duration of the COVID-19 declaration under Section 564(b)(1) of the Act, 21 U.S.C.section 360bbb-3(b)(1), unless the authorization is terminated  or revoked sooner.       Influenza A by PCR NEGATIVE NEGATIVE   Influenza B by PCR NEGATIVE NEGATIVE    Comment: (NOTE) The Xpert Xpress SARS-CoV-2/FLU/RSV plus assay is intended as an aid in the diagnosis of influenza from Nasopharyngeal swab specimens and should not be used as a sole basis for treatment. Nasal  washings and aspirates are unacceptable for Xpert Xpress SARS-CoV-2/FLU/RSV testing.  Fact Sheet for Patients: EntrepreneurPulse.com.au  Fact Sheet for Healthcare Providers: IncredibleEmployment.be  This test is not yet approved or cleared by the [place] FDA and has been authorized for detection and/or diagnosis of SARS-CoV-2 by FDA under an Emergency Use Authorization (EUA). This EUA will remain in effect (meaning this test can be used) for the duration of the COVID-19 declaration under Section 564(b)(1) of the Act, 21 U.S.C. section 360bbb-3(b)(1), unless the authorization is terminated or revoked.  Performed at [facility], [place] [address] [place], [place] [address]    EEG adult  Result Date: 12/25/2021 Lora Havens, MD     12/25/2021 12:52 PM Patient Name: SETH FRIEDLANDER MRN: [id number] Epilepsy Attending: Lora Havens Referring Physician/Provider: Greta Doom, MD Date: 12/25/2021 Duration: 22.53 mins Patient history: 71 year old female with glioblastoma and now with new onset partial seizures. EEG to evaluate for seizure Level of alertness: Awake, drowsy AEDs during EEG study: LEV Technical aspects: This EEG study was done with scalp electrodes positioned according to the 10-20 International system of electrode placement. Electrical activity was acquired at a sampling rate of '500Hz'$  and reviewed with a high frequency filter of '70Hz'$  and a low frequency filter of '1Hz'$ . EEG data were recorded continuously and digitally stored. Description: The posterior dominant rhythm consists of 8 Hz activity of moderate voltage (25-35 uV) seen predominantly in posterior head regions, asymmetric ( right<left) and reactive to eye opening and eye closing. Drowsiness was characterized by attenuation of the posterior background rhythm. EEG showed continuous 3-'5hz'$  theta-delta slowing in right hemisphere. Frequent sharp waves are also noted in  right hemisphere.  Hyperventilation and photic stimulation were not performed.   ABNORMALITY - Sharp wave, right hemisphere - Continuous slow, right hemisphere - Background asymmetry, right<left IMPRESSION: This study showed evidence  of epileptogenicity and cortical dysfunction arising from right hemisphere. No seizures were seen throughout the recording. Lora Havens   MR BRAIN W WO CONTRAST  Result Date: 12/25/2021 CLINICAL DATA:  Provided history: Stroke, follow-up. Left-sided weakness. EXAM: MRI HEAD WITHOUT AND WITH CONTRAST TECHNIQUE: Multiplanar, multiecho pulse sequences of the brain and surrounding structures were obtained without and with intravenous contrast. CONTRAST:  11m GADAVIST GADOBUTROL 1 MMOL/ML IV SOLN COMPARISON:  Prior head CT examinations 12/25/2021 and earlier. Prior brain MRI examinations 10/06/2021 and earlier FINDINGS: Intermittent motion degradation. Brain: Redemonstrated resection cavity within the right frontotemporal lobes, as well as right insula and right basal ganglia region. Persistent chronic hemosiderin deposition at the resection site. Persistent curvilinear and mildly irregular enhancement along the margins of the resection cavity. The nodular focus of contrast enhancement along the anteroinferior aspect of the resection cavity demonstrated on the prior brain MRI of 09/28/2021 is no longer appreciated. Since the prior MRI, a rim of restricted diffusion along the periphery of the resection cavity has increased in conspicuity. Additionally, there is a new irregular focus of restricted diffusion extending from the superior aspect of the resection cavity into the right corona radiata, measuring 4.8 x 2.1 x 2.3 cm. Extensive T2 FLAIR hyperintense signal abnormality within the right frontal, parietal and temporal lobes, right insula, as well as right basal ganglia and adjacent white matter tracks. This T2 FLAIR hyperintense signal abnormality has progressed within the  anterior right frontal lobe and within the right parietal lobe. Mass effect with partial effacement the right lateral ventricle and 4 mm leftward midline shift. This leftward midline shift is similar to the prior brain MRI in April, but has decreased from the head CT of 11/10/2021 (measuring 11 mm on this prior exam). Progressive moderate multifocal T2 FLAIR hyperintense signal abnormality within the left cerebral hemisphere, nonspecific but likely reflecting a combination of chronic small vessel ischemic disease and treatment-related changes. Patchy T2 FLAIR hyperintense signal abnormality within the pons and bilateral cerebellar hemisphere, also progressed from prior MRI. Persistent small extra-axial fluid collection deep to the right-sided cranioplasty, measuring 4 mm in thickness. Vascular: Maintained flow voids within the proximal large arterial vessels. Skull and upper cervical spine: Right-sided cranioplasty. No focal suspicious marrow lesion. Incompletely assessed cervical spondylosis. Sinuses/Orbits: No mass or acute finding within the imaged orbits. No significant paranasal sinus disease. Other: Trace fluid within the bilateral mastoid air cells. IMPRESSION: Known extensive infiltrative right cerebral hemispheric brain tumor status post partial resection. Since the prior MRI of 10/06/2021, restricted diffusion along the resection cavity margins has increased in conspicuity, and there is a new irregular focus of restricted diffusion extending into the right corona radiata measuring 4.8 x 2.1 x 2.3 cm. This progressive restricted diffusion may reflect tumor necrosis (related to Avastin therapy) or tumor progression. An acute infarct within the right corona radiata cannot be excluded. Extensive T2 FLAIR hyperintense signal abnormality throughout much of the right cerebral hemisphere, with interval progression of signal abnormality in the anterior right frontal lobe and within the right parietal lobe. Mass  effect with partial effacement the right lateral ventricle and 4 mm leftward midline shift. Multifocal T2 FLAIR hyperintense signal abnormality within the left cerebral white matter, pons and bilateral cerebellar white matter. Although nonspecific, findings likely reflect a combination of chronic small vessel ischemic disease and treatment-related changes. Electronically Signed   By: KKellie SimmeringD.O.   On: 12/25/2021 08:52   CT HEAD CODE STROKE WO CONTRAST  Result Date: 12/25/2021 CLINICAL  DATA:  Code stroke. 71 year old female neurologic deficit. History of glioblastoma status post resection, chemo radiation. EXAM: CT HEAD WITHOUT CONTRAST TECHNIQUE: Contiguous axial images were obtained from the base of the skull through the vertex without intravenous contrast. RADIATION DOSE REDUCTION: This exam was performed according to the departmental dose-optimization program which includes automated exposure control, adjustment of the mA and/or kV according to patient size and/or use of iterative reconstruction technique. COMPARISON:  Restaging brain MRI 10/06/2021.  Head CT 11/10/2021. FINDINGS: Brain: Right hemisphere tumoral edema persists, with extensive frontal and temporal lobe involvement. Continued mass effect on the right lateral ventricle, although perhaps slightly regressed compared to 10/06/2021. And since 11/10/2021, leftward midline shift has substantially improved, 3-4 mm now versus up to 11 mm last month. Basilar cistern patency has improved. No superimposed No acute intracranial hemorrhage identified. No superimposed acute cortically based infarct is evident. Vascular: Calcified atherosclerosis at the skull base. No suspicious intracranial vascular hyperdensity. Skull: Stable right frontotemporal craniotomy. Sinuses/Orbits: Visualized paranasal sinuses and mastoids are stable and well aerated. Other: No acute orbit or scalp soft tissue finding. ASPECTS [facility] Stroke Program Early CT Score) Total  score (0-10 with 10 being normal): Infiltrative brain tumor. IMPRESSION: 1. Extensive infiltrative right hemisphere brain tumor appears regressed since 11/10/2021. 2. No superimposed acute cortically based infarct or acute intracranial hemorrhage identified. 3. These results were communicated to Dr. Leonel Ramsay at 6:05 am on 12/25/2021 by text page via the [facility] messaging system. Electronically Signed   By: Genevie Ann M.D.   On: 12/25/2021 06:05    Pending Labs Unresulted Labs (From admission, onward)    None       Vitals/Pain Today's Vitals   12/25/21 1241 12/25/21 1400 12/25/21 1600 12/25/21 1800  BP:  (!) 165/90 (!) 150/82 (!) 148/88  Pulse:  84 72 87  Resp:  (!) 22 (!) 21 (!) 22  Temp: 98.7 F (37.1 C)     TempSrc: Oral     SpO2:  100% 100% 98%  Weight:      PainSc:        Isolation Precautions No active isolations  Medications Medications  levETIRAcetam (KEPPRA) IVPB 500 mg/100 mL premix (500 mg Intravenous New Bag/Given 12/25/21 1808)  sodium chloride flush (NS) 0.9 % injection 3 mL (3 mLs Intravenous Not Given 12/25/21 1007)  0.9 %  sodium chloride infusion ( Intravenous Rate/Dose Change 12/25/21 1800)  ondansetron (ZOFRAN) injection 4 mg (has no administration in time range)  albuterol (PROVENTIL) (2.5 MG/3ML) 0.083% nebulizer solution 2.5 mg (has no administration in time range)  dexamethasone (DECADRON) injection 4 mg (has no administration in time range)  morphine (PF) 2 MG/ML injection 2 mg (has no administration in time range)  morphine (ROXANOL) 20 MG/ML concentrated solution 5-10 mg (has no administration in time range)  bisacodyl (DULCOLAX) EC tablet 10 mg (has no administration in time range)  glycopyrrolate (ROBINUL) injection 0.3 mg (has no administration in time range)  antiseptic oral rinse (BIOTENE) solution 15 mL (has no administration in time range)  polyvinyl alcohol (LIQUIFILM TEARS) 1.4 % ophthalmic solution 1 drop (has no administration in time range)   LORazepam (ATIVAN) injection 1 mg (has no administration in time range)  levETIRAcetam (KEPPRA) IVPB 1500 mg/ 100 mL premix (0 mg Intravenous Stopped 12/25/21 0722)  gadobutrol (GADAVIST) 1 MMOL/ML injection 9 mL (9 mLs Intravenous Contrast Given 12/25/21 0818)    Mobility non-ambulatory High fall risk   Focused Assessments Neuro Assessment Handoff:  Swallow screen pass? No  Cardiac Rhythm: Normal sinus rhythm NIH Stroke Scale ( + Modified Stroke Scale Criteria)  Level of Consciousness (1a.)   : Not alert, but arousable by minor stimulation to obey, answer, or respond LOC Questions (1b. )   +: Answers neither question correctly LOC Commands (1c. )   + : Performs both tasks correctly Best Gaze (2. )  +: Normal Visual (3. )  +: No visual loss Facial Palsy (4. )    : Minor paralysis Motor Arm, Left (5a. )   +: Some effort against gravity Motor Arm, Right (5b. )   +: Drift Motor Leg, Left (6a. )   +: Some effort against gravity Motor Leg, Right (6b. )   +: Drift Limb Ataxia (7. ): Absent Sensory (8. )   +: Mild-to-moderate sensory loss, patient feels pinprick is less sharp or is dull on the affected side, or there is a loss of superficial pain with pinprick, but patient is aware of being touched Best Language (9. )   +: Mild-to-moderate aphasia Dysarthria (10. ): Mild-to-moderate dysarthria, patient slurs at least some words and, at worst, can be understood with some difficulty Extinction/Inattention (11.)   +: No Abnormality Modified SS Total  +: 10 Complete NIHSS TOTAL: 11 Last date known well: 12/24/21 Last time known well: 2000 Neuro Assessment: Exceptions to WDL Neuro Checks:      Last Documented NIHSS Modified Score: 10 (12/25/21 1143) Has TPA been given? No If patient is a Neuro Trauma and patient is going to OR before floor call report to [place] nurse: [phone] or [phone]   R Recommendations: See Admitting Provider Note  Report given to:   Additional  Notes: Pt is on RA.

## 2021-12-25 NOTE — ED Notes (Signed)
Pt from CT into room 31. EDP int room, at [facility].

## 2021-12-25 NOTE — Consult Note (Signed)
Palliative Care Consult Note                                  Date: 12/25/2021   Patient Name: Denise Wright  DOB: Sep 14, 1950  MRN: [id number]  Age / Sex: 71 y.o., female  PCP: Willey Blade, MD Referring Physician: Norval Morton, MD  Reason for Consultation: {Reason for Consult:23484}  HPI/Patient Profile: Palliative Care consult requested for goals of care discussion in this 71 y.o. female  with past medical history of *** admitted on 12/25/2021 with ***.   Past Medical History:  Diagnosis Date   Glioblastoma ([place])    Hyperlipidemia    Hypertension    Pneumonia    Thyroid disease      Subjective:   This NP Osborne Oman reviewed medical records, received report from team, assessed the patient and then met at the patient's bedside with *** to discuss diagnosis, prognosis, GOC, EOL wishes disposition and options.   Concept of Palliative Care was introduced as specialized medical care for people and their families living with serious illness.  It focuses on providing relief from the symptoms and stress of a serious illness.  The goal is to improve quality of life for both the patient and the family. Values and goals of care important to patient and family were attempted to be elicited.  Created space and opportunity for patient and family to explore state of health prior to admission, thoughts, and feelings.   We discussed Her current illness and what it means in the larger context of Her on-going co-morbidities. Natural disease trajectory and expectations were discussed.  **verbalized understanding of current illness and co-morbidities.   I discussed the importance of continued conversation with family and their medical providers regarding overall plan of care and treatment options, ensuring decisions are within the context of the patients values and GOCs.  Questions and concerns were addressed.  Hard Choices booklet left  for review. The family was encouraged to call with questions or concerns.  PMT will continue to support holistically as needed.  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)  Normocytic anemia  Obesity, Class III, BMI 40-49.9 (morbid obesity) ([place])  Essential hypertension  Hypothyroidism   Scheduled Meds:  [START ON 12/26/2021] dexamethasone (DECADRON) injection  4 mg Intravenous Daily   sodium chloride flush  3 mL Intravenous Q12H    Continuous Infusions:  sodium chloride 75 mL/hr at 12/25/21 0938   levETIRAcetam      PRN Meds: albuterol, antiseptic oral rinse, bisacodyl, glycopyrrolate, LORazepam, morphine injection, morphine, [DISCONTINUED] ondansetron **OR** ondansetron (ZOFRAN) IV, polyvinyl alcohol  Allergies  Allergen Reactions   Fexofenadine Hives   Penicillins Itching and Other (See Comments)    Pulling sensation in body    Review of Systems Unless otherwise noted, a complete review of systems is negative.  Physical Exam General: NAD, frail chronically-ill appearing Cardiovascular: regular rate and rhythm Pulmonary: clear ant fields, diminished bilaterally  Abdomen: soft, nontender, + bowel sounds Extremities: no edema, no joint deformities Skin: no rashes, warm and dry Neurological:   Vital Signs:  BP (!) 165/90   Pulse 84   Temp 98.7 F (37.1 C) (Oral)   Resp (!) 22   Wt 95.2 kg   SpO2 100%   BMI 37.78 kg/m  Pain Scale: Faces   Pain Score: 0-No pain  SpO2: SpO2: 100 % O2 Device:SpO2: 100 % O2 Flow Rate: .   IO: Intake/output summary: No intake or output data in the 24 hours ending 12/25/21 1643  LBM:   Baseline Weight: Weight: 95.2 kg Most recent weight: Weight: 95.2 kg      Palliative Assessment/Data: ***   Advanced Care Planning:   Primary Decision Maker: {Primary Decision  IBBCW:88891}  Code Status/Advance Care Planning: {Palliative Code status:23503}  A discussion was had today regarding advanced directives. Concepts specific to code status, artifical feeding and hydration, continued IV antibiotics and rehospitalization was had.  The difference between a aggressive medical intervention path and a palliative comfort care path was discussed. ***The MOST form was introduced and discussed.***  Hospice and Palliative Care services outpatient were explained and offered. Patient and family verbalized their understanding and awareness of both palliative and hospice's goals and philosophy of care.   Decisions/Changes to ACP: ***  Assessment & Plan:   SUMMARY OF RECOMMENDATIONS   DNR/DNI/Full Code-as confirmed by  Continue with current plan of care  PMT will continue to support and follow as needed. Please call team line with urgent needs.  Symptom Management:  Per Attending/See below  Palliative Prophylaxis:  {Palliative Prophylaxis:21015}  Additional Recommendations (Limitations, Scope, Preferences): {Recommended Scope and Preferences:21019}  Psycho-social/Spiritual:  Desire for further Chaplaincy support: {YES NO:22349} Additional Recommendations: {PAL SOCIAL:21064}  Prognosis:  {Palliative Care Prognosis:23504}  Discharge Planning:  {Palliative dispostion:23505}   Discussed with:     Patient*** expressed understanding and was in agreement with this plan.   Time In: *** Time Out: *** Time Total: ***  Visit consisted of counseling and education dealing with the complex and emotionally intense issues of symptom management and palliative care in the setting of serious and potentially life-threatening illness.Greater than 50%  of this time was spent counseling and coordinating care related to the above assessment and plan.  Signed by:  Alda Lea, AGPCNP-BC Palliative Medicine Team  Phone: [phone] Pager:  [phone] Amion: Bjorn Pippin   Thank you for allowing the Palliative Medicine Team to assist in the care of this patient. Please utilize secure chat with additional questions, if there is no response within 30 minutes please call the above phone number. Palliative Medicine Team providers are available by phone from 7am to 5pm daily and can be reached through the team cell phone.  Should this patient require assistance outside of these hours, please call the patient's attending physician.

## 2021-12-25 NOTE — Progress Notes (Signed)
EEG complete - results pending 

## 2021-12-25 NOTE — H&P (Signed)
History and Physical    Patient: Denise Wright IWL:[id number] DOB: Nov 25, 1950 DOA: 12/25/2021 DOS: the patient was seen and examined on 12/25/2021 PCP: Willey Blade, MD  Patient coming from:  via EMS  Chief Complaint:  Chief Complaint  Patient presents with   Code Stroke   HPI: Denise Wright is a 71 y.o. female with medical history significant of hypertension, hyperlipidemia, glioblastoma (s/p right temporal craniotomy, radiation, and Temodar), hypothyroidism, DVTs on Eliquis, and GERD who presents with left-sided weakness after episode of jerking.  Last noted to be in her normal state of health at around 8 PM yesterday evening the facility.  This morning patient was found to have left-sided jerking of the arm and leg which lasted 5-10 minutes.  She had been reported to be awake and able to talk during this episode, but the patient did not recall this.  The patient had been doing better approximately 2 weeks ago and was up walking with assistance.  Since that time daughter reports that rapid decline to the point which patient is not eating.  She last ate 2 spoonfuls of food per daughter 2 days ago.  She has been complaining of headaches, but they just recently prescribed morphine patient's last appointment with Dr. Mickeal Skinner was on 7/13 where he recommended hospice due to patient's decline after she had received Avastin infusion.  Daughter confirms DNR status.  Patient was initially seen as a code stroke and was seen by neurology.   CT scan of the head without contrast noted u extensive infiltrative right hemisphere brain tumor appearing regressed since imaging done on 6/2 without signs of infarct or bleeding. Ultimately was canceled as symptoms thought secondary to seizure with concern for Todd's paralysis.  Patient was noted to be afebrile with mild tachypnea and blood pressures elevated up to 149/90.  Labs appears near patient's baseline.  Neurology recommended admission for MRI and EEG.   Patient has been given 150 mg of Keppra IV and started on Decadron 4 mg IV.  Review of Systems: unable to review all systems due to the inability of the patient to answer questions. Past Medical History:  Diagnosis Date   Glioblastoma ([place])    Hyperlipidemia    Hypertension    Pneumonia    Thyroid disease    Past Surgical History:  Procedure Laterality Date   ABDOMINAL HYSTERECTOMY  1740   APPLICATION OF CRANIAL NAVIGATION Right 07/06/2021   Procedure: APPLICATION OF CRANIAL NAVIGATION;  Surgeon: Dawley, Theodoro Doing, DO;  Location: [facility];  Service: Neurosurgery;  Laterality: Right;   CRANIOTOMY Right 07/06/2021   Procedure: CRANIOTOMY TUMOR EXCISION;  Surgeon: Karsten Ro, DO;  Location: [facility];  Service: Neurosurgery;  Laterality: Right;   TONSILLECTOMY  1965   TOOTH EXTRACTION     Social History:  reports that she has never smoked. She has never used smokeless tobacco. She reports that she does not drink alcohol and does not use drugs.  Allergies  Allergen Reactions   Fexofenadine Hives   Penicillins Itching and Other (See Comments)    Pulling sensation in body    Family History  Problem Relation Age of Onset   Diabetes Mother    Hypertension Mother    Hypertension Father    Leukemia Father    Stroke Father    Cancer Brother    Pneumonia Brother     Prior to Admission medications   Medication Sig Start Date End Date Taking? Authorizing Provider  acetaminophen (TYLENOL) 325 MG tablet  Take 2 tablets (650 mg total) by mouth every 6 (six) hours as needed for mild pain (or Fever >/= 101). 07/21/21   Angiulli, Lavon Paganini, PA-C  ALPRAZolam Duanne Moron) 0.25 MG tablet Take 1 tablet (0.25 mg total) by mouth at bedtime as needed for anxiety or sleep. Patient not taking: Reported on 12/21/2021 11/17/21   Damita Lack, MD  amLODipine (NORVASC) 5 MG tablet Take 5 mg by mouth daily.    [provider]  apixaban (ELIQUIS) 5 MG TABS tablet Take 1 tablet (5 mg total) by mouth 2 (two)  times daily. 11/17/21   Amin, Jeanella Flattery, MD  b complex vitamins capsule Take 1 capsule by mouth daily.    [provider]  bisacodyl (DULCOLAX) 5 MG EC tablet Take 2 tablets (10 mg total) by mouth daily as needed for moderate constipation. 11/17/21   Amin, Jeanella Flattery, MD  cetirizine (ZYRTEC) 10 MG tablet Take 10 mg by mouth daily.    [provider]  cholecalciferol (VITAMIN D) 1000 UNITS tablet Take 2,000 Units by mouth daily.    [provider]  docusate sodium (COLACE) 100 MG capsule Take 1 capsule (100 mg total) by mouth 2 (two) times daily. 07/21/21   Angiulli, Lavon Paganini, PA-C  famotidine (PEPCID) 20 MG tablet Take 1 tablet (20 mg total) by mouth daily. 08/31/21   Ventura Sellers, MD  levothyroxine (SYNTHROID) 50 MCG tablet Take 1 tablet (50 mcg total) by mouth daily. 07/21/21   Angiulli, Lavon Paganini, PA-C  LORazepam (ATIVAN) 0.5 MG tablet Take 1-2 tablets (0.5-1 mg total) by mouth every 6 (six) hours as needed for anxiety or seizure. 12/21/21   Pickenpack-Cousar, Carlena Sax, NP  morphine (ROXANOL) 20 MG/ML concentrated solution Take 0.25-0.5 mLs (5-10 mg total) by mouth every 2 (two) hours as needed for severe pain, moderate pain or shortness of breath. 12/21/21   Pickenpack-Cousar, Carlena Sax, NP  Multiple Vitamins-Minerals (MULTIVITAMIN WITH MINERALS) tablet Take 1 tablet by mouth daily.    [provider]  ondansetron (ZOFRAN-ODT) 8 MG disintegrating tablet Take 1 tablet (8 mg total) by mouth every 6 (six) hours as needed for nausea or vomiting. 12/21/21   Pickenpack-Cousar, Carlena Sax, NP  phenazopyridine (PYRIDIUM) 100 MG tablet Take 1 tablet (100 mg total) by mouth 3 (three) times daily with meals. 11/17/21   Amin, Jeanella Flattery, MD  polyethylene glycol (MIRALAX) 17 g packet Take 17 g by mouth daily. 11/21/21   Ventura Sellers, MD  senna (SENOKOT) 8.6 MG TABS tablet Take 1 tablet (8.6 mg total) by mouth daily. 11/21/21   Ventura Sellers, MD    Physical  Exam: Vitals:   12/25/21 0655 12/25/21 0700 12/25/21 0715 12/25/21 0730  BP:  (!) 141/84 (!) 149/90 140/88  Pulse: 79 86 86 77  Resp: 20 17 (!) 21 17  Temp:  98.7 F (37.1 C)    TempSrc:  Oral    SpO2: 95% 98% 93% 94%  Weight:       Exam  Constitutional: Chronically ill-appearing elderly female Eyes:  lids and conjunctivae normal ENMT: Mucous membranes are dry with sore of the left side of the corner of the mouth. Neck: normal, supple.  No JVD. Respiratory: clear to auscultation bilaterally, no wheezing, no crackles. Normal respiratory effort. No accessory muscle use.  Cardiovascular: Regular rate and rhythm, no murmurs / rubs / gallops.  Plus pitting bilateral lower extremity edema. Abdomen: no tenderness, no masses palpated. Bowel sounds positive.  Musculoskeletal: no clubbing /  cyanosis. No joint deformity upper and lower extremities. Good ROM, no contractures. Normal muscle tone.  Skin: Postradiation skin changes noted of the head Neurologic: CN 2-12 grossly intact.  Left-sided weakness Psychiatric: Lethargic, oriented at least to self.  Data Reviewed:  Reviewed labs, imaging, and pertinent record as noted above in HPI  Assessment and Plan:  Partial seizure cerebral edema Patient presents after being witnessed having jerking of the left upper and lower extremity.  CT imaging noted significant edema, but had not been on Decadron at baseline. Patient appears to be is in the process of moving towards hospice. -Admit to a telemetry bed -Seizure and aspiration precautions -Urinalysis -Check EEG and MRI per neurology recommendations -Speech therapy consult -Keppra 500 mg twice daily -Decadron 4 mg daily -Transition IV meds to p.o. once able -Appreciate neurology consultative services we will follow-up for any further recommendation  Glioblastoma Patient status post right temporal craniotomy and resection with Dr. Reatha Armour on 1/26.  Patient underwent radiation therapy with  concurrent Temodar.  6/13 started on Bevacizumab.  Followed up with Dr. Mickeal Skinner on 7/13 and recommended to likely transition to hospice. -Palliative care consulted, to help with goals of care  Normocytic anemia Stable.  Hemoglobin 10.4 g/dL which appears around patient's baseline. -Continue to monitor  Essential hypertension Home blood pressure regimen includes amlodipine 5 mg daily -Continue amlodipine    History of DVT on chronic anticoagulation -Continue Eliquis  Hypothyroidism TSH was 2.339 on 11/11/2021.  Home medication regimen includes levothyroxine 50 mcg daily. -Continue levothyroxine  GERD -Continue Pepcid IV  Obesity BMI 37.78 kg/m2  Advance Care Planning:   Code Status: DNR  Consults: Neurology  Family Communication: Daughter updated at bedside  Severity of Illness: The appropriate patient status for this patient is OBSERVATION. Observation status is judged to be reasonable and necessary in order to provide the required intensity of service to ensure the patient's safety. The patient's presenting symptoms, physical exam findings, and initial radiographic and laboratory data in the context of their medical condition is felt to place them at decreased risk for further clinical deterioration. Furthermore, it is anticipated that the patient will be medically stable for discharge from the hospital within 2 midnights of admission.   Author: Norval Morton, MD 12/25/2021 8:11 AM  For on call review [email or web address].

## 2021-12-25 NOTE — ED Notes (Signed)
MRI notified pt ready

## 2021-12-26 DIAGNOSIS — R569 Unspecified convulsions: Secondary | ICD-10-CM

## 2021-12-26 DIAGNOSIS — Z515 Encounter for palliative care: Secondary | ICD-10-CM | POA: Diagnosis not present

## 2021-12-26 DIAGNOSIS — G40209 Localization-related (focal) (partial) symptomatic epilepsy and epileptic syndromes with complex partial seizures, not intractable, without status epilepticus: Secondary | ICD-10-CM

## 2021-12-26 DIAGNOSIS — Z66 Do not resuscitate: Secondary | ICD-10-CM | POA: Diagnosis not present

## 2021-12-26 DIAGNOSIS — C719 Malignant neoplasm of brain, unspecified: Secondary | ICD-10-CM | POA: Diagnosis not present

## 2021-12-26 DIAGNOSIS — G936 Cerebral edema: Secondary | ICD-10-CM | POA: Diagnosis not present

## 2021-12-26 MED ORDER — DEXAMETHASONE SODIUM PHOSPHATE 4 MG/ML IJ SOLN: 4.0000 mg | Freq: Every day | INTRAMUSCULAR | 0 refills | Status: AC

## 2021-12-26 MED ORDER — LEVETIRACETAM IN NACL 500 MG/100ML IV SOLN: 500.0000 mg | Freq: Two times a day (BID) | INTRAVENOUS | 0 refills | Status: AC

## 2021-12-26 NOTE — Assessment & Plan Note (Signed)
Hold BP meds with plan for comofrt

## 2021-12-26 NOTE — Plan of Care (Signed)
  Problem: Education: Goal: Knowledge of General Education information will improve Description: Including pain rating scale, medication(s)/side effects and non-pharmacologic comfort measures 12/26/2021 1410 by Laure Kidney, RN Outcome: Adequate for Discharge 12/26/2021 1409 by Laure Kidney, RN Outcome: Adequate for Discharge   Problem: Health Behavior/Discharge Planning: Goal: Ability to manage health-related needs will improve 12/26/2021 1410 by Laure Kidney, RN Outcome: Adequate for Discharge 12/26/2021 1409 by Laure Kidney, RN Outcome: Adequate for Discharge   Problem: Clinical Measurements: Goal: Ability to maintain clinical measurements within normal limits will improve 12/26/2021 1410 by Laure Kidney, RN Outcome: Adequate for Discharge 12/26/2021 1409 by Laure Kidney, RN Outcome: Adequate for Discharge Goal: Will remain free from infection 12/26/2021 1410 by Laure Kidney, RN Outcome: Adequate for Discharge 12/26/2021 1409 by Laure Kidney, RN Outcome: Adequate for Discharge Goal: Diagnostic test results will improve 12/26/2021 1410 by Laure Kidney, RN Outcome: Adequate for Discharge 12/26/2021 1409 by Laure Kidney, RN Outcome: Adequate for Discharge Goal: Respiratory complications will improve 12/26/2021 1410 by Laure Kidney, RN Outcome: Adequate for Discharge 12/26/2021 1409 by Laure Kidney, RN Outcome: Adequate for Discharge Goal: Cardiovascular complication will be avoided 12/26/2021 1410 by Laure Kidney, RN Outcome: Adequate for Discharge 12/26/2021 1409 by Laure Kidney, RN Outcome: Adequate for Discharge   Problem: Activity: Goal: Risk for activity intolerance will decrease 12/26/2021 1410 by Laure Kidney, RN Outcome: Adequate for Discharge 12/26/2021 1409 by Laure Kidney, RN Outcome: Adequate for Discharge   Problem: Nutrition: Goal: Adequate nutrition will be maintained 12/26/2021 1410 by Laure Kidney, RN Outcome: Adequate for  Discharge 12/26/2021 1409 by Laure Kidney, RN Outcome: Adequate for Discharge   Problem: Coping: Goal: Level of anxiety will decrease 12/26/2021 1410 by Laure Kidney, RN Outcome: Adequate for Discharge 12/26/2021 1409 by Laure Kidney, RN Outcome: Adequate for Discharge   Problem: Elimination: Goal: Will not experience complications related to bowel motility 12/26/2021 1410 by Laure Kidney, RN Outcome: Adequate for Discharge 12/26/2021 1409 by Laure Kidney, RN Outcome: Adequate for Discharge Goal: Will not experience complications related to urinary retention 12/26/2021 1410 by Laure Kidney, RN Outcome: Adequate for Discharge 12/26/2021 1409 by Laure Kidney, RN Outcome: Adequate for Discharge   Problem: Pain Managment: Goal: General experience of comfort will improve 12/26/2021 1410 by Laure Kidney, RN Outcome: Adequate for Discharge 12/26/2021 1409 by Laure Kidney, RN Outcome: Adequate for Discharge   Problem: Safety: Goal: Ability to remain free from injury will improve 12/26/2021 1410 by Laure Kidney, RN Outcome: Adequate for Discharge 12/26/2021 1409 by Laure Kidney, RN Outcome: Adequate for Discharge   Problem: Skin Integrity: Goal: Risk for impaired skin integrity will decrease 12/26/2021 1410 by Laure Kidney, RN Outcome: Adequate for Discharge 12/26/2021 1409 by Laure Kidney, RN Outcome: Adequate for Discharge

## 2021-12-26 NOTE — Assessment & Plan Note (Signed)
Will dc eliquis with plan for comfort

## 2021-12-26 NOTE — Hospital Course (Signed)
Denise Wright is Denise Wright 71 y.o. female with medical history significant of hypertension, hyperlipidemia, glioblastoma (s/p right temporal craniotomy, radiation, and Temodar), hypothyroidism, DVTs on Eliquis, and GERD who presents with left-sided weakness after episode of jerking.  Last noted to be in her normal state of health at around 8 PM yesterday evening the facility.  This morning patient was found to have left-sided jerking of the arm and leg which lasted 5-10 minutes.  She had been reported to be awake and able to talk during this episode, but the patient did not recall this.  The patient had been doing better approximately 2 weeks ago and was up walking with assistance.  Since that time daughter reports that rapid decline to the point which patient is not eating.  She last ate 2 spoonfuls of food per daughter 2 days ago.  She has been complaining of headaches, but they just recently prescribed morphine patient's last appointment with Denise Wright was on 7/13 where he recommended hospice due to patient's decline after she had received Avastin infusion.  Daughter confirms DNR status.   Patient was initially seen as Denise Wright code stroke and was seen by neurology.   CT scan of the head without contrast noted u extensive infiltrative right hemisphere brain tumor appearing regressed since imaging done on 6/2 without signs of infarct or bleeding. Ultimately code stroke canceled as symptoms thought secondary to seizure with concern for Todd's paralysis.  She was started on keppra and decadron.  Neurology recommended MRI and EEG.    Discussion with palliative care and plan for comfort measures and [facility].

## 2021-12-26 NOTE — Assessment & Plan Note (Signed)
noted 

## 2021-12-26 NOTE — Progress Notes (Signed)
[name]      DOB: 1950/06/17      SNK:[id number]      Palliative Medicine Team    Subjective: Bedside symptom check completed. No family or visitors present at time of visit.    Physical exam: Patient resting in bed with eyes closed at time of visit. Breathing even and non-labored, no excessive secretions noted. Patient without physical or non-verbal signs of pain or discomfort at this time. Patient easily awakens to verbal cue, able to answer some questions but not all. States that she "is not hurting." She does not maintain eye contact, drifting back to sleep mid conversation with this RN.   Assessment and plan: This RN touched base with bedside RN, without any needs or concerns today, will follow up with hospice liaison regarding residential bed offer. Patient remains stable this morning for transfer. Will continue to follow for any changes or advances.    Thank you for allowing the Palliative Medicine Team to assist in the care of this patient.     Damian Leavell, MSN, RN Palliative Medicine Team Team Phone: [phone]  This phone is monitored 7a-7p, please reach out to attending physician outside of these hours for urgent needs.

## 2021-12-26 NOTE — Progress Notes (Signed)
   Palliative Medicine Inpatient Follow Up Note     Chart Reviewed. Patient assessed at the bedside.   Remains minimally responsive. Will move right arm. Does not open her eyes or respond to verbal stimuli. No family at the bedside. Appears comfortable.   Continue with comfort focused care. Pending [employer] approval and bed availability. Will continue to assess for stability to transfer.   Questions addressed and support provided.    Objective Assessment: Vital Signs Vitals:   12/26/21 0459 12/26/21 0816  BP: (!) 170/94 (!) 164/94  Pulse: 98 87  Resp: 19 18  Temp: 98.8 F (37.1 C) 98.2 F (36.8 C)  SpO2: 100% 95%    Intake/Output Summary (Last 24 hours) at 12/26/2021 0859 Last data filed at 12/26/2021 0600 Gross per 24 hour  Intake 1334.86 ml  Output 200 ml  Net 1134.86 ml   Last Weight  Most recent update: 12/26/2021 12:43 AM    Weight  94.7 kg (208 lb 12.4 oz)            Gen:  Unresponsive  CV: Regular rate and rhythm, no murmurs rubs or gallops PULM: diminished bilaterally EXT: bilateral extremity edema  Neuro: lethargic, unresponsive, comfort care   SUMMARY OF RECOMMENDATIONS   Continue with current plan of care, comfort focused. Pending [employer] approval and bed availability PMT will continue to support and follow on as needed basis. Please secure chat for urgent needs.   Symptom Management:  Morphine PRN for pain/air hunger/comfort Robinul PRN for excessive secretions Ativan PRN for agitation/anxiety/seizures Zofran PRN for nausea Liquifilm tears PRN for dry eyes May have comfort feeding if awake and alert Comfort cart for family Unrestricted visitations in the setting of EOL (per policy) Oxygen PRN 2L or less for comfort. No escalation.   Time Total: 15 min.   Visit consisted of counseling and education dealing with the complex and emotionally intense issues of symptom management and palliative care in the setting of serious and  potentially life-threatening illness.Greater than 50%  of this time was spent counseling and coordinating care related to the above assessment and plan.  Alda Lea, AGPCNP-BC  Palliative Medicine Team [phone]  Palliative Medicine Team providers are available by phone from 7am to 7pm daily and can be reached through the team cell phone. Should this patient require assistance outside of these hours, please call the patient's attending physician.

## 2021-12-26 NOTE — Assessment & Plan Note (Addendum)
Started on keppra and decadron CT head noted extensive infiltrative R hemisphere brain tumor regressed since 11/10/2021 MRI with known extensive infiltrative R cerebral hemispheric brain tumor s/p partial resection - since MRI 10/06/21 restricted fiffusion along cavity margins increased in conspicuity and new irregular focus of restricted diffusion extending into R corona radiata measuring 4.8x2.1x2.3 (may reflect tumor necrosis or tumor progression).  Acute infarct within right corona radiata cannot be excluded.  Extensive T2 flair hyperintense signoal abnormality throughout much of the R cerebral hemisphere with interval progression of signal abnormality in the anterior right frontal lobe and within the right parietal lobe.  Mass effect with partial effacement of the right lateral ventricle and 4 mm L midline shift.  Multifocal T2 flair hyperintense signal abnormality within the L cerebral white matter, pons, and bilateral cerebellar white matter.  EEG with evidence of epileptogenicity and cortical dysfunction arising from right hemisphere Plan at this point is for comfort and inpatient hospice Will continue keppra and decadron at discharge for comfort

## 2021-12-26 NOTE — Discharge Summary (Signed)
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 female with medical history significant of hypertension, hyperlipidemia, glioblastoma (s/p right temporal craniotomy, radiation, and Temodar), hypothyroidism, DVTs on Eliquis, and GERD who presents with left-sided weakness after episode of jerking.  Last noted to be in her normal state of health at around 8 PM yesterday evening the facility.  This morning patient was found to have left-sided jerking of the arm and leg which lasted 5-10 minutes.  She had been reported to be awake and able to talk during this episode, but the patient did not recall this.  The patient had been doing better approximately 2 weeks ago and was up walking with assistance.  Since that time daughter reports that rapid decline to the point which patient is not eating.  She last ate 2 spoonfuls of food per daughter 2 days ago.  She has been complaining of headaches, but they just recently prescribed morphine patient's last appointment with Denise Wright was on 7/13 where he recommended hospice due to patient's decline after she had received Avastin infusion.  Daughter confirms DNR  status.   Patient was initially seen as Denise Wright code stroke and was seen by neurology.   CT scan of the head without contrast noted u extensive infiltrative right hemisphere brain tumor appearing regressed since imaging done on 6/2 without signs of infarct or bleeding. Ultimately code stroke canceled as symptoms thought secondary to seizure with concern for Todd's paralysis.  She was started on keppra and decadron.  Neurology recommended MRI and EEG.    Discussion with palliative care and plan for comfort measures and [facility].  Hospital Course:  Assessment and Plan: Partial seizure ([name]) Started on keppra and decadron CT head noted extensive infiltrative R hemisphere brain tumor regressed since 11/10/2021 MRI with known extensive infiltrative R cerebral hemispheric brain tumor s/p partial resection - since MRI 10/06/21 restricted fiffusion along cavity margins increased in conspicuity and new irregular focus of restricted diffusion extending into R corona radiata measuring 4.8x2.1x2.3 (may reflect tumor necrosis or tumor progression).  Acute infarct within right corona radiata cannot be excluded.  Extensive T2 flair hyperintense signoal abnormality throughout much of the R cerebral hemisphere with interval progression of signal abnormality in the anterior right frontal lobe and within the right parietal lobe.  Mass effect with partial effacement of the right lateral ventricle and 4 mm L midline shift.  Multifocal T2 flair hyperintense signal abnormality within the L cerebral white matter, pons, and bilateral cerebellar white matter.  EEG with evidence of epileptogenicity and cortical dysfunction arising from right hemisphere Plan at this point is for comfort and inpatient hospice Will continue keppra and decadron at discharge for comfort  Glioblastoma [facility]) Patient status post right temporal craniotomy and resection with Dr. Reatha Wright on 1/26.  Patient underwent radiation therapy with concurrent Temodar.   6/13 started on Bevacizumab.  Followed up with Denise Wright on 7/13 and recommended to likely transition to hospice. -Palliative care consulted, per palliative care conversation 7/17, she's transitioned to comfort measures and plan is for [facility] todaay  Normocytic anemia noted  Essential hypertension Hold BP meds with plan for comofrt  History of DVT (deep vein thrombosis) Will dc eliquis with plan for comfort  Obesity, Class III, BMI 40-49.9 (morbid obesity) ([place]) Body mass index is 37.58 kg/m.   Hypothyroidism Synthroid will be d/c'd with plan for comfort          Procedures: EEG IMPRESSION: This study showed evidence of epileptogenicity and cortical dysfunction arising from right hemisphere. No seizures were seen throughout the recording.  Consultations: Neurology  palliative  Discharge Exam: Vitals:   12/26/21 0459 12/26/21 0816  BP: (!) 170/94 (!) 164/94  Pulse: 98 87  Resp: 19 18  Temp: 98.8 F (37.1 C) 98.2 F (36.8 C)  SpO2: 100% 95%   Multiple family/friends at bedside (daughter, Denise Wright) No complaints   General: No acute distress. Lungs: unlabored Neurological: mildly confused, lethargic, but responds appropriately to questions  Discharge Instructions   Discharge Instructions     Diet - low sodium heart healthy   Complete by: As directed    Discharge instructions   Complete by: As directed    You were seen after Denise Wright partal seizure.   We've started you on antiseizure medicine and steroids.  We're working on discharging you to inpatient hospice to focus on your comfort.  Please let us know if you have any questions or concerns regarding your hospitalization.   Increase activity slowly   Complete by: As directed       Allergies as of 12/26/2021       Reactions   Fexofenadine Hives   Penicillins Itching, Other (See Comments)   Pulling sensation in body        Medication List     STOP taking these medications    amLODipine  5 MG tablet Commonly known as: NORVASC   apixaban 5 MG Tabs tablet Commonly known as: ELIQUIS   phenazopyridine 100 MG tablet Commonly known as: PYRIDIUM       TAKE these medications    acetaminophen 325 MG tablet Commonly known as: TYLENOL Take 2 tablets (650 mg total) by mouth every 6 (six) hours as needed for mild pain (or Fever >/= 101).   b complex vitamins capsule Take 1 capsule by mouth daily.   bisacodyl 5 MG EC tablet Commonly known as: DULCOLAX Take 2 tablets (10 mg total) by mouth daily as needed for moderate constipation.   cetirizine 10 MG tablet Commonly known as: ZYRTEC Take 10 mg by mouth daily.   cholecalciferol 1000 units tablet Commonly known as: VITAMIN D Take 2,000 Units by mouth daily.   dexamethasone 4 MG/ML injection Commonly known as: DECADRON Inject 1 mL (4 mg total) into the vein daily. Start taking on: December 27, 2021   docusate sodium 100 MG capsule Commonly known as: COLACE Take 1 capsule (100 mg total) by mouth 2 (two) times daily.   dronabinol 2.5 MG capsule Commonly known as: MARINOL Take 2.5 mg by mouth 2 (two) times daily.   Ensure Plus Liqd Take 237 mLs by mouth 2 (two) times daily between meals. chocolate   famotidine 20 MG tablet  Commonly known as: PEPCID Take 1 tablet (20 mg total) by mouth daily.   levETIRAcetam 500 MG/100ML Soln Commonly known as: KEPRRA Inject 100 mLs (500 mg total) into the vein every 12 (twelve) hours.   levothyroxine 50 MCG tablet Commonly known as: SYNTHROID Take 1 tablet (50 mcg total) by mouth daily.   LORazepam 0.5 MG tablet Commonly known as: ATIVAN Take 1-2 tablets (0.5-1 mg total) by mouth every 6 (six) hours as needed for anxiety or seizure.   morphine 20 MG/ML concentrated solution Commonly known as: ROXANOL Take 0.25-0.5 mLs (5-10 mg total) by mouth every 2 (two) hours as needed for severe pain, moderate pain or shortness of breath.   multivitamin with minerals tablet Take 1  tablet by mouth daily.   ondansetron 8 MG disintegrating tablet Commonly known as: ZOFRAN-ODT Take 1 tablet (8 mg total) by mouth every 6 (six) hours as needed for nausea or vomiting.   polyethylene glycol 17 g packet Commonly known as: MiraLax Take 17 g by mouth daily. What changed:  when to take this reasons to take this   senna 8.6 MG Tabs tablet Commonly known as: SENOKOT Take 1 tablet (8.6 mg total) by mouth daily.       Allergies  Allergen Reactions   Fexofenadine Hives   Penicillins Itching and Other (See Comments)    Pulling sensation in body      The results of significant diagnostics from this hospitalization (including imaging, microbiology, ancillary and laboratory) are listed below for reference.    Significant Diagnostic Studies: EEG adult  Result Date: December 27, 2021 Lora Havens, MD     12/27/21 12:52 PM Patient Name: BRINDLE LEYBA MRN: [id number] Epilepsy Attending: Lora Havens Referring Physician/Provider: Greta Doom, MD Date: 12-27-2021 Duration: 22.53 mins Patient history: 71 year old female with glioblastoma and now with new onset partial seizures. EEG to evaluate for seizure Level of alertness: Awake, drowsy AEDs during EEG study: LEV Technical aspects: This EEG study was done with scalp electrodes positioned according to the 10-20 International system of electrode placement. Electrical activity was acquired at Jilberto Vanderwall sampling rate of '500Hz'$  and reviewed with Avielle Imbert high frequency filter of '70Hz'$  and Anhad Sheeley low frequency filter of '1Hz'$ . EEG data were recorded continuously and digitally stored. Description: The posterior dominant rhythm consists of 8 Hz activity of moderate voltage (25-35 uV) seen predominantly in posterior head regions, asymmetric ( right<left) and reactive to eye opening and eye closing. Drowsiness was characterized by attenuation of the posterior background rhythm. EEG showed continuous 3-'5hz'$  theta-delta slowing in right hemisphere. Frequent  sharp waves are also noted in right hemisphere.  Hyperventilation and photic stimulation were not performed.   ABNORMALITY - Sharp wave, right hemisphere - Continuous slow, right hemisphere - Background asymmetry, right<left IMPRESSION: This study showed evidence of epileptogenicity and cortical dysfunction arising from right hemisphere. No seizures were seen throughout the recording. Lora Havens   MR BRAIN W WO CONTRAST  Result Date: 27-Dec-2021 CLINICAL DATA:  Provided history: Stroke, follow-up. Left-sided weakness. EXAM: MRI HEAD WITHOUT AND WITH CONTRAST TECHNIQUE: Multiplanar, multiecho pulse sequences of the brain and surrounding structures were obtained without and with intravenous contrast. CONTRAST:  79m GADAVIST GADOBUTROL 1 MMOL/ML IV SOLN COMPARISON:  Prior head CT examinations 007/19/2023and earlier. Prior brain MRI examinations 10/06/2021 and earlier FINDINGS: Intermittent motion degradation. Brain: Redemonstrated resection cavity within the right frontotemporal lobes, as well as right insula and right basal ganglia region. Persistent chronic hemosiderin deposition at the resection site. Persistent  curvilinear and mildly irregular enhancement along the margins of the resection cavity. The nodular focus of contrast enhancement along the anteroinferior aspect of the resection cavity demonstrated on the prior brain MRI of 09/28/2021 is no longer appreciated. Since the prior MRI, Sie Formisano rim of restricted diffusion along the periphery of the resection cavity has increased in conspicuity. Additionally, there is Lathon Adan new irregular focus of restricted diffusion extending from the superior aspect of the resection cavity into the right corona radiata, measuring 4.8 x 2.1 x 2.3 cm. Extensive T2 FLAIR hyperintense signal abnormality within the right frontal, parietal and temporal lobes, right insula, as well as right basal ganglia and adjacent white matter tracks. This T2 FLAIR hyperintense signal abnormality has  progressed within the anterior right frontal lobe and within the right parietal lobe. Mass effect with partial effacement the right lateral ventricle and 4 mm leftward midline shift. This leftward midline shift is similar to the prior brain MRI in April, but has decreased from the head CT of 11/10/2021 (measuring 11 mm on this prior exam). Progressive moderate multifocal T2 FLAIR hyperintense signal abnormality within the left cerebral hemisphere, nonspecific but likely reflecting Carlea Badour combination of chronic small vessel ischemic disease and treatment-related changes. Patchy T2 FLAIR hyperintense signal abnormality within the pons and bilateral cerebellar hemisphere, also progressed from prior MRI. Persistent small extra-axial fluid collection deep to the right-sided cranioplasty, measuring 4 mm in thickness. Vascular: Maintained flow voids within the proximal large arterial vessels. Skull and upper cervical spine: Right-sided cranioplasty. No focal suspicious marrow lesion. Incompletely assessed cervical spondylosis. Sinuses/Orbits: No mass or acute finding within the imaged orbits. No significant paranasal sinus disease. Other: Trace fluid within the bilateral mastoid air cells. IMPRESSION: Known extensive infiltrative right cerebral hemispheric brain tumor status post partial resection. Since the prior MRI of 10/06/2021, restricted diffusion along the resection cavity margins has increased in conspicuity, and there is Majesty Stehlin new irregular focus of restricted diffusion extending into the right corona radiata measuring 4.8 x 2.1 x 2.3 cm. This progressive restricted diffusion may reflect tumor necrosis (related to Avastin therapy) or tumor progression. An acute infarct within the right corona radiata cannot be excluded. Extensive T2 FLAIR hyperintense signal abnormality throughout much of the right cerebral hemisphere, with interval progression of signal abnormality in the anterior right frontal lobe and within the right  parietal lobe. Mass effect with partial effacement the right lateral ventricle and 4 mm leftward midline shift. Multifocal T2 FLAIR hyperintense signal abnormality within the left cerebral white matter, pons and bilateral cerebellar white matter. Although nonspecific, findings likely reflect Talani Brazee combination of chronic small vessel ischemic disease and treatment-related changes. Electronically Signed   By: Kellie Simmering D.O.   On: 12/25/2021 08:52   CT HEAD CODE STROKE WO CONTRAST  Result Date: 12/25/2021 CLINICAL DATA:  Code stroke. 70 year old female neurologic deficit. History of glioblastoma status post resection, chemo radiation. EXAM: CT HEAD WITHOUT CONTRAST TECHNIQUE: Contiguous axial images were obtained from the base of the skull through the vertex without intravenous contrast. RADIATION DOSE REDUCTION: This exam was performed according to the departmental dose-optimization program which includes automated exposure control, adjustment of the mA and/or kV according to patient size and/or use of iterative reconstruction technique. COMPARISON:  Restaging brain MRI 10/06/2021.  Head CT 11/10/2021. FINDINGS: Brain: Right hemisphere tumoral edema persists, with extensive frontal and temporal lobe involvement. Continued mass effect on the right lateral ventricle, although perhaps slightly regressed compared to 10/06/2021. And since 11/10/2021, leftward midline shift has substantially improved,  3-4 mm now versus up to 11 mm last month. Basilar cistern patency has improved. No superimposed No acute intracranial hemorrhage identified. No superimposed acute cortically based infarct is evident. Vascular: Calcified atherosclerosis at the skull base. No suspicious intracranial vascular hyperdensity. Skull: Stable right frontotemporal craniotomy. Sinuses/Orbits: Visualized paranasal sinuses and mastoids are stable and well aerated. Other: No acute orbit or scalp soft tissue finding. ASPECTS [facility] Stroke Program Early  CT Score) Total score (0-10 with 10 being normal): Infiltrative brain tumor. IMPRESSION: 1. Extensive infiltrative right hemisphere brain tumor appears regressed since 11/10/2021. 2. No superimposed acute cortically based infarct or acute intracranial hemorrhage identified. 3. These results were communicated to Dr. Leonel Ramsay at 6:05 am on 12/25/2021 by text page via the [facility] messaging system. Electronically Signed   By: Genevie Ann M.D.   On: 12/25/2021 06:05    Microbiology: Recent Results (from the past 240 hour(s))  Resp Panel by RT-PCR (Flu Hollin Crewe&B, Covid) Anterior Nasal Swab     Status: None   Collection Time: 12/25/21  9:30 AM   Specimen: Anterior Nasal Swab  Result Value Ref Range Status   SARS Coronavirus 2 by RT PCR NEGATIVE NEGATIVE Final    Comment: (NOTE) SARS-CoV-2 target nucleic acids are NOT DETECTED.  The SARS-CoV-2 RNA is generally detectable in upper respiratory specimens during the acute phase of infection. The lowest concentration of SARS-CoV-2 viral copies this assay can detect is 138 copies/mL. Dinesh Ulysse negative result does not preclude SARS-Cov-2 infection and should not be used as the sole basis for treatment or other patient management decisions. Mylah Baynes negative result may occur with  improper specimen collection/handling, submission of specimen other than nasopharyngeal swab, presence of viral mutation(s) within the areas targeted by this assay, and inadequate number of viral copies(<138 copies/mL). Katriona Schmierer negative result must be combined with clinical observations, patient history, and epidemiological information. The expected result is Negative.  Fact Sheet for Patients:  EntrepreneurPulse.com.au  Fact Sheet for Healthcare Providers:  IncredibleEmployment.be  This test is no t yet approved or cleared by the [place] FDA and  has been authorized for detection and/or diagnosis of SARS-CoV-2 by FDA under an Emergency Use Authorization (EUA).  This EUA will remain  in effect (meaning this test can be used) for the duration of the COVID-19 declaration under Section 564(b)(1) of the Act, 21 U.S.C.section 360bbb-3(b)(1), unless the authorization is terminated  or revoked sooner.       Influenza Jannie Doyle by PCR NEGATIVE NEGATIVE Final   Influenza B by PCR NEGATIVE NEGATIVE Final    Comment: (NOTE) The Xpert Xpress SARS-CoV-2/FLU/RSV plus assay is intended as an aid in the diagnosis of influenza from Nasopharyngeal swab specimens and should not be used as Brookelynn Hamor sole basis for treatment. Nasal washings and aspirates are unacceptable for Xpert Xpress SARS-CoV-2/FLU/RSV testing.  Fact Sheet for Patients: EntrepreneurPulse.com.au  Fact Sheet for Healthcare Providers: IncredibleEmployment.be  This test is not yet approved or cleared by the [place] FDA and has been authorized for detection and/or diagnosis of SARS-CoV-2 by FDA under an Emergency Use Authorization (EUA). This EUA will remain in effect (meaning this test can be used) for the duration of the COVID-19 declaration under Section 564(b)(1) of the Act, 21 U.S.C. section 360bbb-3(b)(1), unless the authorization is terminated or revoked.  Performed at [facility], [place] [address] [place], [place] [address]      Labs: Basic Metabolic Panel: Recent Labs  Lab 12/21/21 0837 12/25/21 0611  NA 139 139  K 3.6  3.7  CL 105 106  CO2 25 26  GLUCOSE 106* 99  BUN 9 7*  CREATININE 0.88 0.97  CALCIUM 8.8* 8.8*   Liver Function Tests: Recent Labs  Lab 12/21/21 0837 12/25/21 0611  AST 13* 19  ALT 7 12  ALKPHOS 67 64  BILITOT 0.6 0.7  PROT 6.7 6.5  ALBUMIN 3.3* 2.8*   No results for input(s): "LIPASE", "AMYLASE" in the last 168 hours. No results for input(s): "AMMONIA" in the last 168 hours. CBC: Recent Labs  Lab 12/21/21 0837 12/25/21 0611  WBC 6.0 5.3  NEUTROABS 4.6 3.5  HGB 10.6* 10.4*  HCT 31.7* 33.3*  MCV 92.7  97.7  PLT 280 296   Cardiac Enzymes: No results for input(s): "CKTOTAL", "CKMB", "CKMBINDEX", "TROPONINI" in the last 168 hours. BNP: BNP (last 3 results) Recent Labs    11/10/21 2124  BNP 39.6    ProBNP (last 3 results) No results for input(s): "PROBNP" in the last 8760 hours.  CBG: No results for input(s): "GLUCAP" in the last 168 hours.     Signed:  Fayrene Helper MD.  [facility] 12/26/2021, 12:54 PM

## 2021-12-26 NOTE — TOC Transition Note (Signed)
Transition of Care [facility]) - CM/SW Discharge Note   Patient Details  Name: Denise Wright MRN: [id number] Date of Birth: 04/21/1951  Transition of Care [facility]) CM/SW Contact:  Pollie Friar, RN Phone Number: 12/26/2021, 12:41 PM   Clinical Narrative:    Pt is discharging to [facility] today. [facility] has verified they have a bed available. Pt will transport via [facility]. Bedside RN updated and dc packet is at the desk.   Number for report: [phone]   Final next level of care: [place] Barriers to Discharge: No Barriers Identified   Patient Goals and CMS Choice     Choice offered to / list presented to : Adult Children  Discharge Placement                       Discharge Plan and Services                                     Social Determinants of Health (SDOH) Interventions     Readmission Risk Interventions    11/13/2021   10:49 AM  Readmission Risk Prevention Plan  Transportation Screening Complete  PCP or Specialist Appt within 3-5 Days Complete  HRI or [place] Complete  Social Work Consult for [place] Planning/Counseling Complete  Palliative Care Screening Complete  Medication Review [employer]) Complete

## 2021-12-26 NOTE — Assessment & Plan Note (Addendum)
Patient status post right temporal craniotomy and resection with Dr. Reatha Armour on 1/26.  Patient underwent radiation therapy with concurrent Temodar.  6/13 started on Bevacizumab.  Followed up with Dr. Mickeal Skinner on 7/13 and recommended to likely transition to hospice. -Palliative care consulted, per palliative care conversation 7/17, she's transitioned to comfort measures and plan is for [facility] place todaay

## 2021-12-26 NOTE — Progress Notes (Signed)
This chaplain responded to PMT consult for EOL spiritual care. The chaplain reviewed the chart notes before the visit. Family is not at the bedside.  The chaplain greeted the Pt. with good morning Denise Wright, and the Pt. responded "good morning." The Pt. appears to be resting comfortably, with eyes closed and without agitation. The Pt. did not respond again to the chaplain after the greeting or to her daughter [name]'s name. The chaplain prayed at the bedside and is available for F/U spiritual care as needed.  Chaplain Sallyanne Kuster [phone]

## 2021-12-26 NOTE — Assessment & Plan Note (Signed)
Synthroid will be d/c'd with plan for comfort

## 2021-12-26 NOTE — Progress Notes (Signed)
Denise Wright [id number] [facility]) Hospice hospital liaison note   Referral received for EOL care at [facility].   Met in room with patient and her [name] to explain services and hospice philosophy.   Denise Wright is appropriate to transfer to [facility] this evening once consents are complete.    Once completed, please fax dc summary to [phone].   RN staff, you may call report at any time to [phone], room is assigned when report is called.   Please send completed DNR with patient.   Updated attending and [facility] manager via [place].  Thank you for the opportunity to participate in this patient's care  [name], [place] hospital liaison [phone]

## 2021-12-26 NOTE — Progress Notes (Signed)
Report called to [facility], given to [employer]

## 2021-12-26 NOTE — Assessment & Plan Note (Signed)
Body mass index is 37.58 kg/m.

## 2022-01-11 ENCOUNTER — Telehealth: Payer: Self-pay

## 2022-01-11 NOTE — Telephone Encounter (Signed)
Notified [name] (daughter) of completion of Disability Forms. Fax transmission confirmation received. Copy of Forms mailed to [facility] as requested. Patient is currently under Hospice care at [facility]. No other needs or concerns voiced at this time.

## 2022-01-24 ENCOUNTER — Ambulatory Visit: Payer: BC Managed Care – PPO | Admitting: Physical Medicine & Rehabilitation

## 2022-02-09 DEATH — deceased
